# Patient Record
Sex: Female | Born: 1965 | Race: White | Hispanic: No | Smoking: Former smoker
Health system: Southern US, Community
[De-identification: ages and names within clinical notes are randomized; demographics above are authoritative.]

## PROBLEM LIST (undated history)

## (undated) DIAGNOSIS — F1021 Alcohol dependence, in remission: Secondary | ICD-10-CM

## (undated) DIAGNOSIS — M3500 Sicca syndrome, unspecified: Secondary | ICD-10-CM

## (undated) DIAGNOSIS — J189 Pneumonia, unspecified organism: Secondary | ICD-10-CM

## (undated) DIAGNOSIS — G43909 Migraine, unspecified, not intractable, without status migrainosus: Secondary | ICD-10-CM

## (undated) DIAGNOSIS — Z87898 Personal history of other specified conditions: Secondary | ICD-10-CM

## (undated) DIAGNOSIS — I10 Essential (primary) hypertension: Secondary | ICD-10-CM

## (undated) DIAGNOSIS — C439 Malignant melanoma of skin, unspecified: Secondary | ICD-10-CM

## (undated) DIAGNOSIS — G8929 Other chronic pain: Secondary | ICD-10-CM

## (undated) DIAGNOSIS — K219 Gastro-esophageal reflux disease without esophagitis: Secondary | ICD-10-CM

## (undated) DIAGNOSIS — G629 Polyneuropathy, unspecified: Secondary | ICD-10-CM

## (undated) DIAGNOSIS — D649 Anemia, unspecified: Secondary | ICD-10-CM

## (undated) DIAGNOSIS — I671 Cerebral aneurysm, nonruptured: Secondary | ICD-10-CM

## (undated) DIAGNOSIS — E119 Type 2 diabetes mellitus without complications: Secondary | ICD-10-CM

## (undated) DIAGNOSIS — E063 Autoimmune thyroiditis: Secondary | ICD-10-CM

## (undated) DIAGNOSIS — E039 Hypothyroidism, unspecified: Secondary | ICD-10-CM

## (undated) DIAGNOSIS — N390 Urinary tract infection, site not specified: Secondary | ICD-10-CM

## (undated) DIAGNOSIS — R339 Retention of urine, unspecified: Secondary | ICD-10-CM

## (undated) DIAGNOSIS — M1990 Unspecified osteoarthritis, unspecified site: Secondary | ICD-10-CM

## (undated) DIAGNOSIS — F909 Attention-deficit hyperactivity disorder, unspecified type: Secondary | ICD-10-CM

## (undated) DIAGNOSIS — E7800 Pure hypercholesterolemia, unspecified: Secondary | ICD-10-CM

## (undated) DIAGNOSIS — Z9289 Personal history of other medical treatment: Secondary | ICD-10-CM

## (undated) DIAGNOSIS — M542 Cervicalgia: Secondary | ICD-10-CM

## (undated) DIAGNOSIS — E049 Nontoxic goiter, unspecified: Secondary | ICD-10-CM

## (undated) DIAGNOSIS — B1910 Unspecified viral hepatitis B without hepatic coma: Secondary | ICD-10-CM

## (undated) DIAGNOSIS — H548 Legal blindness, as defined in USA: Secondary | ICD-10-CM

## (undated) DIAGNOSIS — R569 Unspecified convulsions: Secondary | ICD-10-CM

## (undated) DIAGNOSIS — Z973 Presence of spectacles and contact lenses: Secondary | ICD-10-CM

## (undated) DIAGNOSIS — Z789 Other specified health status: Secondary | ICD-10-CM

## (undated) HISTORY — PX: OVARIAN CYST REMOVAL: SHX89

## (undated) HISTORY — PX: BILATERAL CARPAL TUNNEL RELEASE: SHX6508

## (undated) HISTORY — PX: LAPAROSCOPIC CHOLECYSTECTOMY: SUR755

## (undated) HISTORY — PX: EYE SURGERY: SHX253

---
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
## 1987-11-13 HISTORY — PX: ECTOPIC PREGNANCY SURGERY: SHX613

## 2006-11-12 HISTORY — PX: ABDOMINAL HYSTERECTOMY: SHX81

## 2011-06-01 ENCOUNTER — Emergency Department (HOSPITAL_COMMUNITY)
Admission: EM | Admit: 2011-06-01 | Discharge: 2011-06-01 | Disposition: A | Discharge status: Home / Self Care | Payer: Self-pay | Attending: Emergency Medicine | Admitting: Emergency Medicine

## 2011-06-01 DIAGNOSIS — Z794 Long term (current) use of insulin: Secondary | ICD-10-CM | POA: Insufficient documentation

## 2011-06-01 DIAGNOSIS — Z7982 Long term (current) use of aspirin: Secondary | ICD-10-CM | POA: Insufficient documentation

## 2011-06-01 DIAGNOSIS — Z79899 Other long term (current) drug therapy: Secondary | ICD-10-CM | POA: Insufficient documentation

## 2011-06-01 DIAGNOSIS — R509 Fever, unspecified: Secondary | ICD-10-CM | POA: Insufficient documentation

## 2011-06-01 DIAGNOSIS — E119 Type 2 diabetes mellitus without complications: Secondary | ICD-10-CM | POA: Insufficient documentation

## 2011-06-01 LAB — COMPREHENSIVE METABOLIC PANEL
ALT: 14 U/L (ref 0–35)
AST: 18 U/L (ref 0–37)
Albumin: 4 g/dL (ref 3.5–5.2)
CO2: 23 mEq/L (ref 19–32)
Calcium: 9.6 mg/dL (ref 8.4–10.5)
GFR calc non Af Amer: >60 mL/min (ref >60)
Sodium: 134 mEq/L — ABNORMAL LOW (ref 135–145)
Total Protein: 7.3 g/dL (ref 6.0–8.3)

## 2011-06-01 LAB — URINALYSIS, ROUTINE W REFLEX MICROSCOPIC
Glucose, UA: NEGATIVE mg/dL
Leukocytes, UA: NEGATIVE
Nitrite: NEGATIVE
Protein, ur: NEGATIVE mg/dL

## 2011-06-01 LAB — DIFFERENTIAL
Basophils Absolute: 0.1 10*3/uL (ref 0.0–0.1)
Basophils Relative: 1 % (ref 0–1)
Monocytes Absolute: 0.7 10*3/uL (ref 0.1–1.0)
Neutro Abs: 4.8 10*3/uL (ref 1.7–7.7)
Neutrophils Relative %: 53 % (ref 43–77)

## 2011-06-01 LAB — CBC
Hemoglobin: 13.1 g/dL (ref 12.0–15.0)
MCHC: 35.5 g/dL (ref 30.0–36.0)
RBC: 4.08 MIL/uL (ref 3.87–5.11)

## 2011-06-02 LAB — URINE CULTURE
Colony Count: NO GROWTH
Culture  Setup Time: 201207202205
Culture: NO GROWTH

## 2012-01-18 ENCOUNTER — Encounter (HOSPITAL_COMMUNITY): Payer: Self-pay | Admitting: Emergency Medicine

## 2012-01-18 ENCOUNTER — Emergency Department (HOSPITAL_COMMUNITY)
Admission: EM | Admit: 2012-01-18 | Discharge: 2012-01-18 | Disposition: A | Discharge status: Home / Self Care | Payer: Self-pay | Attending: Emergency Medicine | Admitting: Emergency Medicine

## 2012-01-18 DIAGNOSIS — Z794 Long term (current) use of insulin: Secondary | ICD-10-CM | POA: Insufficient documentation

## 2012-01-18 DIAGNOSIS — E119 Type 2 diabetes mellitus without complications: Secondary | ICD-10-CM | POA: Insufficient documentation

## 2012-01-18 DIAGNOSIS — I10 Essential (primary) hypertension: Secondary | ICD-10-CM | POA: Insufficient documentation

## 2012-01-18 DIAGNOSIS — Z760 Encounter for issue of repeat prescription: Secondary | ICD-10-CM | POA: Insufficient documentation

## 2012-01-18 HISTORY — DX: Essential (primary) hypertension: I10

## 2012-01-18 MED ORDER — INSULIN GLARGINE 100 UNIT/ML ~~LOC~~ SOLN: 10.0000 [IU] | Freq: Two times a day (BID) | SUBCUTANEOUS | Status: DC

## 2012-01-18 NOTE — ED Provider Notes (Signed)
Medical screening examination/treatment/procedure(s) were performed by non-physician practitioner and as supervising physician I was immediately available for consultation/collaboration.   Gwyneth Sprout, MD 01/18/12 1506

## 2012-01-18 NOTE — ED Notes (Signed)
Felicia, patient advocate at bedside speaking with patient.

## 2012-01-18 NOTE — ED Notes (Signed)
Pt here requesting rx for lantus insulin; pt sts does not have insurance and almost out of meds; pt sts is type 1 DM

## 2012-01-18 NOTE — Progress Notes (Signed)
Received first and only page/consult at 10:16am, spoke with Johnatta. Per pharmacy, patient is eligible for the ZZ fund.

## 2012-01-18 NOTE — Discharge Instructions (Signed)
Return here as needed for any worsening in your condition °

## 2012-01-18 NOTE — ED Notes (Signed)
Asked Research scientist (medical) to re-page Case Manager.

## 2012-01-18 NOTE — ED Notes (Signed)
Thurston Hole, case manager at bedside speaking with patient.

## 2012-01-18 NOTE — Progress Notes (Signed)
I was able to coordinate [facility] with patient and an appointment is scheduled for Tuesday, March 12th with [facility]. Obtained a vial of lantus for patient and she has a script for another refill. She does not need the novolog 70/30  Since she stated she has three bottles at home. Patient was very appreciative of resources we could provide.

## 2012-01-18 NOTE — ED Notes (Signed)
Patient presents requesting referral and sources for future medication refills on her insulin.  Patient denies symptoms of dizziness, weakness, vomiting or pain.  Patient resting quietly, family at bedside, case Production designer, theatre/television/film and social work were contacted

## 2012-01-18 NOTE — ED Provider Notes (Signed)
History     CSN: [id number]  Arrival date & time 01/18/12  4098   First MD Initiated Contact with Patient 01/18/12 [phone]      Chief Complaint  Patient presents with  . Medication Refill    (Consider location/radiation/quality/duration/timing/severity/associated sxs/prior treatment) HPI Patient is here because she is about to run out of Lantus insulin and does not have a primary care doctor.  She has no complaints at this time.  She denies chest pain, shortness of breath, weakness, vomiting, fever, abdominal pain, or excessive urination. Past Medical History  Diagnosis Date  . Diabetes mellitus   . Hypertension     History reviewed. No pertinent past surgical history.  History reviewed. No pertinent family history.  History  Substance Use Topics  . Smoking status: Not on file  . Smokeless tobacco: Not on file  . Alcohol Use:     OB History    Grav Para Term Preterm Abortions TAB SAB Ect Mult Living                  Review of Systems All pertinent positives and negatives reviewed in the history of present illness  Allergies  Codeine and Sulfa antibiotics  Home Medications   Current Outpatient Rx  Name Route Sig Dispense Refill  . INSULIN ASPART 100 UNIT/ML [place] SOLN Subcutaneous Inject 4-5 Units into the skin 3 (three) times daily before meals.    . INSULIN GLARGINE 100 UNIT/ML [place] SOLN Subcutaneous Inject 10 Units into the skin 2 (two) times daily.    Marland Kitchen LISINOPRIL 5 MG PO TABS Oral Take 5 mg by mouth daily.    Carma Leaven M PLUS PO TABS Oral Take 1 tablet by mouth daily.    . INSULIN GLARGINE 100 UNIT/ML [place] SOLN Subcutaneous Inject 10 Units into the skin 2 (two) times daily. 10 mL 1    BP 127/87  Pulse 86  Temp(Src) 98.3 F (36.8 C) (Oral)  Resp 20  SpO2 99%  Physical Exam  Constitutional: She appears well-developed and well-nourished. No distress.  HENT:  Head: Normocephalic and atraumatic.  Cardiovascular: Normal rate and regular rhythm.   Pulmonary/Chest:  Effort normal and breath sounds normal.  Skin: Skin is warm and dry.    ED Course  Procedures (including critical care time)  Labs Reviewed - No data to display No results found.   1. Medication refill    The case managers involved in patient's care.  Patient will be given prescription for Lantus.  And advised to return here as needed for any worsening in her condition.   MDM          Carlyle Dolly, PA-C 01/18/12 1045

## 2012-01-18 NOTE — ED Notes (Addendum)
Correction: patient just ambulated to bathroom. Waiting on insulin from pharmacy

## 2012-07-15 ENCOUNTER — Encounter (HOSPITAL_COMMUNITY): Payer: Self-pay | Admitting: Emergency Medicine

## 2012-07-15 ENCOUNTER — Emergency Department (HOSPITAL_COMMUNITY)
Admission: EM | Admit: 2012-07-15 | Discharge: 2012-07-16 | Disposition: A | Discharge status: Home / Self Care | Payer: Medicaid Other | Attending: Emergency Medicine | Admitting: Emergency Medicine

## 2012-07-15 DIAGNOSIS — I10 Essential (primary) hypertension: Secondary | ICD-10-CM | POA: Insufficient documentation

## 2012-07-15 DIAGNOSIS — Z79899 Other long term (current) drug therapy: Secondary | ICD-10-CM | POA: Insufficient documentation

## 2012-07-15 DIAGNOSIS — N390 Urinary tract infection, site not specified: Secondary | ICD-10-CM | POA: Insufficient documentation

## 2012-07-15 DIAGNOSIS — Z794 Long term (current) use of insulin: Secondary | ICD-10-CM | POA: Insufficient documentation

## 2012-07-15 DIAGNOSIS — E119 Type 2 diabetes mellitus without complications: Secondary | ICD-10-CM | POA: Insufficient documentation

## 2012-07-15 LAB — BASIC METABOLIC PANEL
BUN: 19 mg/dL (ref 6–23)
Chloride: 108 mEq/L (ref 96–112)
GFR calc Af Amer: >90 mL/min (ref >90)
Glucose, Bld: 104 mg/dL — ABNORMAL HIGH (ref 70–99)
Potassium: 4 mEq/L (ref 3.5–5.1)
Sodium: 144 mEq/L (ref 135–145)

## 2012-07-15 LAB — URINALYSIS, ROUTINE W REFLEX MICROSCOPIC
Bilirubin Urine: NEGATIVE
Ketones, ur: NEGATIVE mg/dL
Nitrite: NEGATIVE
Protein, ur: NEGATIVE mg/dL
Urobilinogen, UA: 0.2 mg/dL (ref 0.0–1.0)

## 2012-07-15 LAB — CBC
HCT: 37.6 % (ref 36.0–46.0)
Hemoglobin: 12.6 g/dL (ref 12.0–15.0)
RDW: 13.4 % (ref 11.5–15.5)
WBC: 6.9 10*3/uL (ref 4.0–10.5)

## 2012-07-15 LAB — URINE MICROSCOPIC-ADD ON

## 2012-07-15 MED ORDER — CEPHALEXIN 500 MG PO CAPS: 500.0000 mg | ORAL_CAPSULE | Freq: Four times a day (QID) | ORAL | Status: AC

## 2012-07-15 MED ORDER — DEXTROSE 5 % IV SOLN
1.0000 g | Freq: Once | INTRAVENOUS | Status: AC
  Administered 2012-07-15: 1 g via INTRAVENOUS
  Filled 2012-07-15: qty 10

## 2012-07-15 MED ORDER — HYDROCODONE-ACETAMINOPHEN 5-325 MG PO TABS: 1.0000 | ORAL_TABLET | ORAL | Status: AC | PRN

## 2012-07-15 MED ORDER — SODIUM CHLORIDE 0.9 % IV BOLUS (SEPSIS)
1000.0000 mL | Freq: Once | INTRAVENOUS | Status: AC
  Administered 2012-07-15: 1000 mL via INTRAVENOUS

## 2012-07-15 NOTE — ED Provider Notes (Signed)
History     CSN: [id number]  Arrival date & time 07/15/12  1434   First MD Initiated Contact with Patient 07/15/12 2032      Chief Complaint  Patient presents with  . 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  She reports today she wasn't feeling well and having increasing urinary frequency.  She reports that her blood sugars were slightly elevated and she was concerned about infection.  She's had no fevers or chills.  She does denies dysuria.  She's had no vaginal discharge.  She denies nausea vomiting diarrhea.  She's had no cough or congestion.  Her symptoms are mild in severity   Past Medical History  Diagnosis Date  . Diabetes mellitus   . Hypertension     History reviewed. No pertinent past surgical history.  History reviewed. No pertinent family history.  History  Substance Use Topics  . Smoking status: Not on file  . Smokeless tobacco: Not on file  . Alcohol Use:     OB History    Grav Para Term Preterm Abortions TAB SAB Ect Mult Living                  Review of Systems  All other systems reviewed and are negative.    Allergies  Codeine and Sulfa antibiotics  Home Medications   Current Outpatient Rx  Name Route Sig Dispense Refill  . INSULIN ASPART 100 UNIT/ML [place] SOLN Subcutaneous Inject 3-4 Units into the skin 3 (three) times daily before meals.     . INSULIN GLARGINE 100 UNIT/ML [place] SOLN Subcutaneous Inject 7 Units into the skin 2 (two) times daily.     Marland Kitchen LEVOTHYROXINE SODIUM 25 MCG PO TABS Oral Take 25 mcg by mouth daily.    Carma Leaven M PLUS PO TABS Oral Take 1 tablet by mouth daily.    . QUINAPRIL HCL 5 MG PO TABS Oral Take 5 mg by mouth daily.    . CEPHALEXIN 500 MG PO CAPS Oral Take 1 capsule (500 mg total) by mouth 4 (four) times daily. 28 capsule 0  . HYDROCODONE-ACETAMINOPHEN 5-325 MG PO TABS Oral Take 1 tablet by mouth every 4 (four) hours as needed for pain. 15  tablet 0    BP 116/71  Pulse 78  Temp 98.2 F (36.8 C) (Oral)  Resp 18  SpO2 100%  Physical Exam  Nursing note and vitals reviewed. Constitutional: She is oriented to person, place, and time. She appears well-developed and well-nourished. No distress.  HENT:  Head: Normocephalic and atraumatic.  Eyes: EOM are normal.  Neck: Normal range of motion.  Cardiovascular: Normal rate, regular rhythm and normal heart sounds.   Pulmonary/Chest: Effort normal and breath sounds normal.  Abdominal: Soft. She exhibits no distension. There is no tenderness.  Musculoskeletal: Normal range of motion.  Neurological: She is alert and oriented to person, place, and time.  Skin: Skin is warm and dry.  Psychiatric: She has a normal mood and affect. Judgment normal.    ED Course  Procedures (including critical care time)  Labs Reviewed  URINALYSIS, ROUTINE W REFLEX MICROSCOPIC - Abnormal; Notable for the following:    APPearance CLOUDY (*)     Leukocytes, UA MODERATE (*)     All other components within normal limits  URINE MICROSCOPIC-ADD ON - Abnormal; Notable for the following:    Squamous Epithelial / LPF MANY (*)     Bacteria,  UA MANY (*)     All other components within normal limits  BASIC METABOLIC PANEL - Abnormal; Notable for the following:    Glucose, Bld 104 (*)     All other components within normal limits  GLUCOSE, CAPILLARY  CBC  URINE CULTURE   No results found.   1. Urinary tract infection       MDM  Patient has evidence of UTI.  She has no evidence of diabetic ketoacidosis.  Her blood sugars here in the emergency department are controlled.  She was given a dose of Rocephin in emergency apartment discharged home on Keflex.  She is well appearing        Lyanne Co, MD 07/15/12 [phone]

## 2012-07-15 NOTE — ED Notes (Signed)
Per EMS: pt c/o "not feeling good today"; pt sts hx of frequent UTI and sts could be same; pt self caths

## 2012-07-16 NOTE — ED Notes (Signed)
Pt requested to speak with a Child psychotherapist prior to discharge. [name], SW returned called spoke with pt.

## 2012-07-17 LAB — URINE CULTURE: Colony Count: >=100000

## 2012-07-18 NOTE — ED Notes (Signed)
+  Urine. Patient given Keflex. No sensitivity listed. Chart sent to EDP office for review. °

## 2012-11-12 HISTORY — PX: SHOULDER ARTHROSCOPY W/ ROTATOR CUFF REPAIR: SHX2400

## 2012-11-24 ENCOUNTER — Encounter (HOSPITAL_COMMUNITY): Payer: Self-pay | Admitting: *Deleted

## 2012-11-24 ENCOUNTER — Emergency Department (HOSPITAL_COMMUNITY)
Admission: EM | Admit: 2012-11-24 | Discharge: 2012-11-25 | Disposition: A | Discharge status: Home / Self Care | Payer: No Typology Code available for payment source | Attending: Emergency Medicine | Admitting: Emergency Medicine

## 2012-11-24 DIAGNOSIS — Z79899 Other long term (current) drug therapy: Secondary | ICD-10-CM | POA: Insufficient documentation

## 2012-11-24 DIAGNOSIS — L0390 Cellulitis, unspecified: Secondary | ICD-10-CM

## 2012-11-24 DIAGNOSIS — E119 Type 2 diabetes mellitus without complications: Secondary | ICD-10-CM | POA: Insufficient documentation

## 2012-11-24 DIAGNOSIS — I10 Essential (primary) hypertension: Secondary | ICD-10-CM | POA: Insufficient documentation

## 2012-11-24 DIAGNOSIS — Z794 Long term (current) use of insulin: Secondary | ICD-10-CM | POA: Insufficient documentation

## 2012-11-24 DIAGNOSIS — L02419 Cutaneous abscess of limb, unspecified: Secondary | ICD-10-CM | POA: Insufficient documentation

## 2012-11-24 LAB — GLUCOSE, CAPILLARY: Glucose-Capillary: 86 mg/dL (ref 70–99)

## 2012-11-24 LAB — CBC WITH DIFFERENTIAL/PLATELET
Basophils Absolute: 0.1 10*3/uL (ref 0.0–0.1)
Basophils Relative: 1 % (ref 0–1)
Eosinophils Absolute: 0.2 10*3/uL (ref 0.0–0.7)
Eosinophils Relative: 2 % (ref 0–5)
HCT: 38.8 % (ref 36.0–46.0)
Hemoglobin: 13 g/dL (ref 12.0–15.0)
MCH: 30.2 pg (ref 26.0–34.0)
MCHC: 33.5 g/dL (ref 30.0–36.0)
Monocytes Absolute: 1.3 10*3/uL — ABNORMAL HIGH (ref 0.1–1.0)
Monocytes Relative: 13 % — ABNORMAL HIGH (ref 3–12)
Neutro Abs: 7.1 10*3/uL (ref 1.7–7.7)
RDW: 13.7 % (ref 11.5–15.5)

## 2012-11-24 NOTE — ED Notes (Signed)
Pt states 5 days ago turned neck with severe neck pain; joint pain; hands swollen; knees/shoulder/hip/pain; had cut left ankle same day while shaving--a few days later developed redness/heat/swelling at site; developing chills today

## 2012-11-25 LAB — URINALYSIS, ROUTINE W REFLEX MICROSCOPIC
Bilirubin Urine: NEGATIVE
Leukocytes, UA: NEGATIVE
Nitrite: NEGATIVE
Specific Gravity, Urine: 1.019 (ref 1.005–1.030)
Urobilinogen, UA: 0.2 mg/dL (ref 0.0–1.0)
pH: 6 (ref 5.0–8.0)

## 2012-11-25 LAB — COMPREHENSIVE METABOLIC PANEL
AST: 51 U/L — ABNORMAL HIGH (ref 0–37)
Albumin: 3.7 g/dL (ref 3.5–5.2)
BUN: 17 mg/dL (ref 6–23)
Calcium: 9.7 mg/dL (ref 8.4–10.5)
Chloride: 100 mEq/L (ref 96–112)
Creatinine, Ser: 0.73 mg/dL (ref 0.50–1.10)
Total Bilirubin: 0.9 mg/dL (ref 0.3–1.2)
Total Protein: 7.2 g/dL (ref 6.0–8.3)

## 2012-11-25 MED ORDER — SODIUM CHLORIDE 0.9 % IV BOLUS (SEPSIS)
1000.0000 mL | Freq: Once | INTRAVENOUS | Status: AC
  Administered 2012-11-25: 1000 mL via INTRAVENOUS

## 2012-11-25 MED ORDER — NAPROXEN 375 MG PO TABS: 375.0000 mg | ORAL_TABLET | Freq: Two times a day (BID) | ORAL | Status: DC

## 2012-11-25 MED ORDER — CEPHALEXIN 500 MG PO CAPS
1000.0000 mg | ORAL_CAPSULE | Freq: Once | ORAL | Status: AC
  Administered 2012-11-25: 1000 mg via ORAL
  Filled 2012-11-25: qty 2

## 2012-11-25 MED ORDER — CLINDAMYCIN PHOSPHATE 600 MG/50ML IV SOLN
600.0000 mg | Freq: Once | INTRAVENOUS | Status: AC
  Administered 2012-11-25: 600 mg via INTRAVENOUS
  Filled 2012-11-25: qty 50

## 2012-11-25 MED ORDER — CLINDAMYCIN HCL 150 MG PO CAPS: 150.0000 mg | ORAL_CAPSULE | Freq: Four times a day (QID) | ORAL | Status: DC

## 2012-11-25 MED ORDER — IBUPROFEN 800 MG PO TABS
800.0000 mg | ORAL_TABLET | Freq: Once | ORAL | Status: AC
  Administered 2012-11-25: 800 mg via ORAL
  Filled 2012-11-25: qty 1

## 2012-11-25 NOTE — ED Provider Notes (Signed)
Medical screening examination/treatment/procedure(s) were performed by non-physician practitioner and as supervising physician I was immediately available for consultation/collaboration.  Olivia Mackie, MD 11/25/12 [phone]

## 2012-11-25 NOTE — ED Notes (Signed)
Pt. Has noticed a slight rash on both feet towards the heel area. Pt. Wanted to make PA aware so that rash may be assessed before departure.

## 2012-11-25 NOTE — ED Notes (Signed)
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 Pt lives in homeless shelter now so diet has changed. Pt had cut from ankle shaving 5 days prior which has not healed and gotten worse. Ankle is red and swollen despite neosporin being applied before. Pt had an E. Coli virus in 2004 and states it feels similar in her legs. Pt has joints all inflamed and cannot make a fist.

## 2012-11-25 NOTE — ED Provider Notes (Signed)
History     CSN: [id number]  Arrival date & time 11/24/12  2224   First MD Initiated Contact with Patient 11/25/12 0137      No chief complaint on file.   (Consider location/radiation/quality/duration/timing/severity/associated sxs/prior treatment) HPI Comments: Mary Erickson is a 47 y.o. female with a history of type 1 diabetes presents to the emergency department with multiple complaints.  Patient states that she is homeless and currently is living at a [employer].  Primary complaint is left ankle pain, swelling, and warmth.  Patient reports that she cut that ankle while shaving 5 days ago and states concern for infection.  She denies any purulent drainage from wound site, fevers, night sweats or chills. In addition she reports pain to every joint including knees, shoulder, hips, neck and back. She denies rash, weight loss, torticollis. She also reports needing to self cath with straw for urinary retention and that she has increased UTIs from this. She denies having current urinary retention, but does report mild dysuria.   The history is provided by the patient.    Past Medical History  Diagnosis Date  . Diabetes mellitus   . Hypertension     History reviewed. No pertinent past surgical history.  No family history on file.  History  Substance Use Topics  . Smoking status: Not on file  . Smokeless tobacco: Not on file  . Alcohol Use:     OB History    Grav Para Term Preterm Abortions TAB SAB Ect Mult Living                  Review of Systems  All other systems reviewed and are negative.    Allergies  Codeine and Sulfa antibiotics  Home Medications   Current Outpatient Rx  Name  Route  Sig  Dispense  Refill  . IBUPROFEN 200 MG PO TABS   Oral   Take 400 mg by mouth every 8 (eight) hours as needed. For pain.         . INSULIN ASPART 100 UNIT/ML [place] SOLN   Subcutaneous   Inject 3-4 Units into the skin 3 (three) times daily before meals.            . INSULIN GLARGINE 100 UNIT/ML [place] SOLN   Subcutaneous   Inject 7 Units into the skin 2 (two) times daily.          Marland Kitchen LEVOTHYROXINE SODIUM 25 MCG PO TABS   Oral   Take 25 mcg by mouth daily.         Marland Kitchen MAGNESIUM GLUCONATE 500 MG PO TABS   Oral   Take 500 mg by mouth daily.         Carma Leaven M PLUS PO TABS   Oral   Take 1 tablet by mouth daily.         . QUINAPRIL HCL 5 MG PO TABS   Oral   Take 5 mg by mouth daily.           BP 118/71  Pulse 103  Temp 99 F (37.2 C) (Oral)  Resp 16  SpO2 96%  Physical Exam  Nursing note and vitals reviewed. Constitutional: She is oriented to person, place, and time. She appears well-developed and well-nourished. No distress.  HENT:  Head: Normocephalic and atraumatic.  Eyes: Conjunctivae normal and EOM are normal.  Neck: Normal range of motion.       No nuchal rigidity. Full flexion and extension without difficulty. Mild pain  with rotation. Muscular paraspinal ttp.   Cardiovascular:       Intact distal pulses, tachycardic.   Pulmonary/Chest: Effort normal.       LCAB, normal effort   Musculoskeletal: Normal range of motion.       Full ROM of all extremities, no crepitus or bony ttp.   Neurological: She is alert and oriented to person, place, and time.  Skin: Skin is warm and dry. No rash noted. She is not diaphoretic.       Left ankle with mild warmth, swelling and erythema   Psychiatric: She has a normal mood and affect. Her behavior is normal.    ED Course  Procedures (including critical care time)  Labs Reviewed  CBC WITH DIFFERENTIAL - Abnormal; Notable for the following:    Monocytes Relative 13 (*)     Monocytes Absolute 1.3 (*)     All other components within normal limits  COMPREHENSIVE METABOLIC PANEL - Abnormal; Notable for the following:    AST 51 (*)     ALT 66 (*)     All other components within normal limits  URINALYSIS, ROUTINE W REFLEX MICROSCOPIC - Abnormal; Notable for the following:    Ketones,  ur 15 (*)     All other components within normal limits  GLUCOSE, CAPILLARY   No results found.   No diagnosis found.  BP 118/71  Pulse 101  Temp 99 F (37.2 C) (Oral)  Resp 16  SpO2 98%   MDM  Cellulitis Pt to ER for multiple complaints. Labs and imaging reviewed. Pt appeared dehydrated with a mild case of cellulitis. IVFs and abx given. Pt will be dc with abx and advised to follow up with PCP. Strict return precautions for infection spread.  At this time there does not appear to be any evidence of an acute emergency medical condition and the patient appears stable for discharge with appropriate outpatient follow up.Diagnosis was discussed with patient who verbalizes understanding and is agreeable to discharge.   Jaci Carrel, [place] 11/25/12 [phone]

## 2012-11-25 NOTE — ED Notes (Signed)
PA at bedside.

## 2016-09-24 ENCOUNTER — Other Ambulatory Visit: Payer: Self-pay | Admitting: General Surgery

## 2016-09-24 DIAGNOSIS — C4362 Malignant melanoma of left upper limb, including shoulder: Secondary | ICD-10-CM

## 2016-09-27 ENCOUNTER — Encounter (HOSPITAL_COMMUNITY): Payer: Self-pay

## 2016-09-27 ENCOUNTER — Encounter (HOSPITAL_COMMUNITY)
Admission: RE | Admit: 2016-09-27 | Discharge: 2016-09-27 | Disposition: A | Discharge status: Home / Self Care | Payer: Medicaid Other | Source: Ambulatory Visit | Attending: General Surgery | Admitting: General Surgery

## 2016-09-27 DIAGNOSIS — Z794 Long term (current) use of insulin: Secondary | ICD-10-CM | POA: Insufficient documentation

## 2016-09-27 DIAGNOSIS — H5462 Unqualified visual loss, left eye, normal vision right eye: Secondary | ICD-10-CM | POA: Diagnosis not present

## 2016-09-27 DIAGNOSIS — Z87891 Personal history of nicotine dependence: Secondary | ICD-10-CM | POA: Insufficient documentation

## 2016-09-27 DIAGNOSIS — D649 Anemia, unspecified: Secondary | ICD-10-CM | POA: Insufficient documentation

## 2016-09-27 DIAGNOSIS — N319 Neuromuscular dysfunction of bladder, unspecified: Secondary | ICD-10-CM | POA: Insufficient documentation

## 2016-09-27 DIAGNOSIS — C4362 Malignant melanoma of left upper limb, including shoulder: Secondary | ICD-10-CM | POA: Diagnosis not present

## 2016-09-27 DIAGNOSIS — Z79899 Other long term (current) drug therapy: Secondary | ICD-10-CM | POA: Insufficient documentation

## 2016-09-27 DIAGNOSIS — I10 Essential (primary) hypertension: Secondary | ICD-10-CM | POA: Diagnosis not present

## 2016-09-27 DIAGNOSIS — Z90710 Acquired absence of both cervix and uterus: Secondary | ICD-10-CM | POA: Diagnosis not present

## 2016-09-27 DIAGNOSIS — Z7982 Long term (current) use of aspirin: Secondary | ICD-10-CM | POA: Insufficient documentation

## 2016-09-27 DIAGNOSIS — M1990 Unspecified osteoarthritis, unspecified site: Secondary | ICD-10-CM | POA: Insufficient documentation

## 2016-09-27 DIAGNOSIS — G43909 Migraine, unspecified, not intractable, without status migrainosus: Secondary | ICD-10-CM | POA: Diagnosis not present

## 2016-09-27 DIAGNOSIS — Z9049 Acquired absence of other specified parts of digestive tract: Secondary | ICD-10-CM | POA: Diagnosis not present

## 2016-09-27 DIAGNOSIS — K219 Gastro-esophageal reflux disease without esophagitis: Secondary | ICD-10-CM | POA: Diagnosis not present

## 2016-09-27 DIAGNOSIS — Z9641 Presence of insulin pump (external) (internal): Secondary | ICD-10-CM | POA: Diagnosis not present

## 2016-09-27 DIAGNOSIS — M3500 Sicca syndrome, unspecified: Secondary | ICD-10-CM | POA: Diagnosis not present

## 2016-09-27 DIAGNOSIS — R0602 Shortness of breath: Secondary | ICD-10-CM | POA: Insufficient documentation

## 2016-09-27 DIAGNOSIS — E049 Nontoxic goiter, unspecified: Secondary | ICD-10-CM | POA: Diagnosis not present

## 2016-09-27 DIAGNOSIS — K7589 Other specified inflammatory liver diseases: Secondary | ICD-10-CM | POA: Insufficient documentation

## 2016-09-27 DIAGNOSIS — E039 Hypothyroidism, unspecified: Secondary | ICD-10-CM | POA: Diagnosis not present

## 2016-09-27 DIAGNOSIS — G629 Polyneuropathy, unspecified: Secondary | ICD-10-CM | POA: Diagnosis not present

## 2016-09-27 DIAGNOSIS — E7800 Pure hypercholesterolemia, unspecified: Secondary | ICD-10-CM | POA: Diagnosis not present

## 2016-09-27 DIAGNOSIS — Z01812 Encounter for preprocedural laboratory examination: Secondary | ICD-10-CM | POA: Insufficient documentation

## 2016-09-27 DIAGNOSIS — I728 Aneurysm of other specified arteries: Secondary | ICD-10-CM | POA: Diagnosis not present

## 2016-09-27 DIAGNOSIS — E109 Type 1 diabetes mellitus without complications: Secondary | ICD-10-CM | POA: Insufficient documentation

## 2016-09-27 HISTORY — DX: Nontoxic goiter, unspecified: E04.9

## 2016-09-27 HISTORY — DX: Unspecified osteoarthritis, unspecified site: M19.90

## 2016-09-27 HISTORY — DX: Retention of urine, unspecified: R33.9

## 2016-09-27 HISTORY — DX: Cerebral aneurysm, nonruptured: I67.1

## 2016-09-27 HISTORY — DX: Alcohol dependence, in remission: F10.21

## 2016-09-27 HISTORY — DX: Sjogren syndrome, unspecified: M35.00

## 2016-09-27 HISTORY — DX: Hypercalcemia: E83.52

## 2016-09-27 HISTORY — DX: Personal history of other specified conditions: Z87.898

## 2016-09-27 HISTORY — DX: Hypothyroidism, unspecified: E03.9

## 2016-09-27 HISTORY — DX: Legal blindness, as defined in USA: H54.8

## 2016-09-27 HISTORY — DX: Polyneuropathy, unspecified: G62.9

## 2016-09-27 HISTORY — DX: Migraine, unspecified, not intractable, without status migrainosus: G43.909

## 2016-09-27 HISTORY — DX: Gastro-esophageal reflux disease without esophagitis: K21.9

## 2016-09-27 HISTORY — DX: Urinary tract infection, site not specified: N39.0

## 2016-09-27 HISTORY — DX: Presence of spectacles and contact lenses: Z97.3

## 2016-09-27 HISTORY — DX: Pure hypercholesterolemia, unspecified: E78.00

## 2016-09-27 HISTORY — DX: Malignant melanoma of skin, unspecified: C43.9

## 2016-09-27 HISTORY — DX: Unspecified convulsions: R56.9

## 2016-09-27 LAB — COMPREHENSIVE METABOLIC PANEL
ALBUMIN: 4.4 g/dL (ref 3.5–5.0)
ALT: 35 U/L (ref 14–54)
AST: 36 U/L (ref 15–41)
Alkaline Phosphatase: 69 U/L (ref 38–126)
Anion gap: 9 (ref 5–15)
BUN: 11 mg/dL (ref 6–20)
CHLORIDE: 104 mmol/L (ref 101–111)
CO2: 28 mmol/L (ref 22–32)
CREATININE: 0.77 mg/dL (ref 0.44–1.00)
Calcium: 10 mg/dL (ref 8.9–10.3)
GFR calc non Af Amer: >60 mL/min (ref >60)
GLUCOSE: 90 mg/dL (ref 65–99)
Potassium: 4.1 mmol/L (ref 3.5–5.1)
SODIUM: 141 mmol/L (ref 135–145)
Total Bilirubin: 0.9 mg/dL (ref 0.3–1.2)
Total Protein: 7.3 g/dL (ref 6.5–8.1)

## 2016-09-27 LAB — CBC
HCT: 39.5 % (ref 36.0–46.0)
Hemoglobin: 13.1 g/dL (ref 12.0–15.0)
MCH: 30.8 pg (ref 26.0–34.0)
MCHC: 33.2 g/dL (ref 30.0–36.0)
MCV: 92.9 fL (ref 78.0–100.0)
PLATELETS: 231 10*3/uL (ref 150–400)
RBC: 4.25 MIL/uL (ref 3.87–5.11)
RDW: 12.8 % (ref 11.5–15.5)
WBC: 6.8 10*3/uL (ref 4.0–10.5)

## 2016-09-27 LAB — GLUCOSE, CAPILLARY: GLUCOSE-CAPILLARY: 91 mg/dL (ref 65–99)

## 2016-09-27 NOTE — Pre-Procedure Instructions (Signed)
Mary Erickson  09/27/2016      [facility] [phone] - [place], [address] [place] [address] Phone: [phone] Fax: [phone]    Your procedure is scheduled on Tuesday, November 21st, 2017.  Report to [facility] Admitting at 7:45 A.M.   Call this number if you have problems the morning of surgery:  [phone]   Remember:  Do not eat food or drink liquids after midnight.   Take these medicines the morning of surgery with A SIP OF WATER: Acetaminophen if needed, eye drops if needed, Levothyroxine (Synthroid), Ondansetron (Zofran) if needed.  Stop taking: Aspirin, NSAIDS, Aleve, Naproxen, Ibuprofen, Advil, Motrin, BC"s, Goody's, Fish oil, all herbal medications, and all vitamins.    WHAT DO I DO ABOUT MY DIABETES MEDICATION?  . THE NIGHT BEFORE SURGERY, reduce basal rates by 20% at midnight.    . THE MORNING OF SURGERY, keep basal rates at reduced rate (20%).   How to Manage Your Diabetes Before and After Surgery  Why is it important to control my blood sugar before and after surgery? . Improving blood sugar levels before and after surgery helps healing and can limit problems. . A way of improving blood sugar control is eating a healthy diet by: o  Eating less sugar and carbohydrates o  Increasing activity/exercise o  Talking with your doctor about reaching your blood sugar goals . High blood sugars (greater than 180 mg/dL) can raise your risk of infections and slow your recovery, so you will need to focus on controlling your diabetes during the weeks before surgery. . Make sure that the doctor who takes care of your diabetes knows about your planned surgery including the date and location.  How do I manage my blood sugar before surgery? . Check your blood sugar at least 4 times a day, starting 2 days before surgery, to make sure that the level is not too high or low. o Check your  blood sugar the morning of your surgery when you wake up and every 2 hours until you get to the Short Stay unit. . If your blood sugar is less than 70 mg/dL, you will need to treat for low blood sugar: o Do not take insulin. o Treat a low blood sugar (less than 70 mg/dL) with  cup of clear juice (cranberry or apple), 4 glucose tablets, OR glucose gel. o Recheck blood sugar in 15 minutes after treatment (to make sure it is greater than 70 mg/dL). If your blood sugar is not greater than 70 mg/dL on recheck, call [phone] for further instructions. . Report your blood sugar to the short stay nurse when you get to Short Stay.  . If you are admitted to the hospital after surgery: o Your blood sugar will be checked by the staff and you will probably be given insulin after surgery (instead of oral diabetes medicines) to make sure you have good blood sugar levels. o The goal for blood sugar control after surgery is 80-180 mg/dL.    Do not wear jewelry, make-up or nail polish.  Do not wear lotions, powders, or perfumes, or deoderant.  Do not shave 48 hours prior to surgery.   Do not bring valuables to the hospital.  [facility] is not responsible for any belongings or valuables.  Contacts, dentures or bridgework may not be worn into surgery.  Leave your suitcase in the  car.  After surgery it may be brought to your room.  For patients admitted to the hospital, discharge time will be determined by your treatment team.  Patients discharged the day of surgery will not be allowed to drive home.   Special instructions:  Preparing for Surgery.   Please read over the following fact sheets that you were given.

## 2016-09-27 NOTE — Progress Notes (Signed)
PCP - Dr. Lajoyce Corners Cardiologist - denies Endocrinologist - Dr. Meredith Pel  EKG - requested CXR - denies  Echo - denies Stress test - requested Cardiac Cath - denies  Patient denies chest pain and shortness of breath at PAT appointment.    Patient has Type I diabetes with insulin pump.  States she checks her blood sugar at least 7 times a day.  Patient instructed to decrease basal rate by 20% at midnight prior to surgery and to keep it reduced.  Patient's A1C is 5.8 (09/12/16)  Patient informed nurse that her surgery was scheduled to be outpatient but she does not have anyone that could stay with her for the first 24 hours after the procedure.  Nurse notified Debbie at Dr. Marlowe Aschoff office.

## 2016-09-28 NOTE — Progress Notes (Addendum)
Anesthesia Chart Review: Patient is a 50 year old female scheduled for wide local excision and advancement flap closure left upper arm melanoma with sentinel node mapping and biopsy on 10/02/2016 by Dr. Barry Dienes.  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  - PCP is Dr. Lajoyce Corners with [facility].  - Endocrinologist is Dr. Alanson Aly with [facility], last visit 09/12/16. - Urologist is Dr. Burnell Blanks.   Meds include ASA 81 mg, Lipitor, Premarin, folic acid, gabapentin, Novolog, Lantus, insulin pump, levothyroxine, magnesium gluconate, fish oil, Zofran, vitamin E, zinc. (Left voice message for DM Educator regarding patient with insulin pump and date/time of surgery.)   There were no vitals taken for this visit.  EKG (Dr. Lajoyce Corners): Requested.   She thought she had a stress test at [facility], but only a 2014 echo and EKG received.   12/12/12 Echo [facility]): Summary: Normal left ventricle size and systolic function, with no segmental abnormality. Ejection fraction is visually estimated at 65-70%. There is a small (trivial) pericardial effusion. There is no evidence of right atrial or right ventricular collapse.  05/24/15 EEG [facility]; Care Everywhere): Interpretation:  -Drowsiness and sleep were achieved. -No clear epileptiform activity was noted and therefore no definite evidence was noted for a seizure disorder. -The slow transients noted in the left temporal region indicate a mild neurophysiological disturbance in the left temporal region.  Preoperative labs noted. A1c on 09/12/16  (Care Everywhere) was 5.8.   Per her PAT RN, she does not have anyone to stay with her the first 24 hours after surgery. Debbie at [place] was notified.   Will follow-up once additional records received. I attempted to call patient this afternoon, but there was no answer and voice mail had not been set up.  George Hugh [facility] Short Stay Center/Anesthesiology Phone [phone] 09/28/2016 5:01 PM  Addendum: Still awaiting additional records. Requested from [facility] and from Dr. Shan Levans office. In the interim, available information reviewed with anesthesiologist Dr. Smith Robert. Patient tolerated surgery in August. If no acute changes and no worrisome findings of EKG then it is anticipated that she can proceed as planned. She will need an EKG on arrival if 2017 tracing not received. She has an insulin pump. Currently case is scheduled for 9:45 AM - 11:35 AM. Since it is < 2 hours, she may be able to keep her pump on--will defer to the anesthesiologist.   Myra Gianotti, PA-C [facility] Short Stay Center/Anesthesiology Phone [phone] 10/01/2016 3:29 PM

## 2016-10-02 ENCOUNTER — Ambulatory Visit (HOSPITAL_COMMUNITY): Payer: Medicaid Other | Admitting: Vascular Surgery

## 2016-10-02 ENCOUNTER — Encounter (HOSPITAL_COMMUNITY)
Admission: RE | Admit: 2016-10-02 | Discharge: 2016-10-02 | Disposition: A | Discharge status: Home / Self Care | Payer: Medicaid Other | Source: Ambulatory Visit | Attending: General Surgery | Admitting: General Surgery

## 2016-10-02 ENCOUNTER — Encounter (HOSPITAL_COMMUNITY)
Admission: RE | Disposition: A | Discharge status: Home / Self Care | Payer: Self-pay | Source: Ambulatory Visit | Attending: General Surgery

## 2016-10-02 ENCOUNTER — Ambulatory Visit (HOSPITAL_COMMUNITY)
Admission: RE | Admit: 2016-10-02 | Discharge: 2016-10-03 | Disposition: A | Discharge status: Home / Self Care | Payer: Medicaid Other | Source: Ambulatory Visit | Attending: General Surgery | Admitting: General Surgery

## 2016-10-02 ENCOUNTER — Encounter (HOSPITAL_COMMUNITY): Payer: Self-pay | Admitting: *Deleted

## 2016-10-02 DIAGNOSIS — Z811 Family history of alcohol abuse and dependence: Secondary | ICD-10-CM | POA: Insufficient documentation

## 2016-10-02 DIAGNOSIS — Z833 Family history of diabetes mellitus: Secondary | ICD-10-CM | POA: Insufficient documentation

## 2016-10-02 DIAGNOSIS — I10 Essential (primary) hypertension: Secondary | ICD-10-CM | POA: Diagnosis not present

## 2016-10-02 DIAGNOSIS — Z809 Family history of malignant neoplasm, unspecified: Secondary | ICD-10-CM | POA: Diagnosis not present

## 2016-10-02 DIAGNOSIS — R569 Unspecified convulsions: Secondary | ICD-10-CM | POA: Insufficient documentation

## 2016-10-02 DIAGNOSIS — Z818 Family history of other mental and behavioral disorders: Secondary | ICD-10-CM | POA: Insufficient documentation

## 2016-10-02 DIAGNOSIS — E079 Disorder of thyroid, unspecified: Secondary | ICD-10-CM | POA: Insufficient documentation

## 2016-10-02 DIAGNOSIS — I739 Peripheral vascular disease, unspecified: Secondary | ICD-10-CM | POA: Diagnosis not present

## 2016-10-02 DIAGNOSIS — Z9049 Acquired absence of other specified parts of digestive tract: Secondary | ICD-10-CM | POA: Insufficient documentation

## 2016-10-02 DIAGNOSIS — C4362 Malignant melanoma of left upper limb, including shoulder: Secondary | ICD-10-CM

## 2016-10-02 DIAGNOSIS — G43909 Migraine, unspecified, not intractable, without status migrainosus: Secondary | ICD-10-CM | POA: Diagnosis not present

## 2016-10-02 DIAGNOSIS — E119 Type 2 diabetes mellitus without complications: Secondary | ICD-10-CM | POA: Diagnosis not present

## 2016-10-02 DIAGNOSIS — Z79899 Other long term (current) drug therapy: Secondary | ICD-10-CM | POA: Diagnosis not present

## 2016-10-02 DIAGNOSIS — M3500 Sicca syndrome, unspecified: Secondary | ICD-10-CM | POA: Insufficient documentation

## 2016-10-02 DIAGNOSIS — Z836 Family history of other diseases of the respiratory system: Secondary | ICD-10-CM | POA: Insufficient documentation

## 2016-10-02 DIAGNOSIS — K759 Inflammatory liver disease, unspecified: Secondary | ICD-10-CM | POA: Insufficient documentation

## 2016-10-02 DIAGNOSIS — E7800 Pure hypercholesterolemia, unspecified: Secondary | ICD-10-CM | POA: Insufficient documentation

## 2016-10-02 DIAGNOSIS — Z7982 Long term (current) use of aspirin: Secondary | ICD-10-CM | POA: Insufficient documentation

## 2016-10-02 DIAGNOSIS — Z823 Family history of stroke: Secondary | ICD-10-CM | POA: Diagnosis not present

## 2016-10-02 DIAGNOSIS — Z794 Long term (current) use of insulin: Secondary | ICD-10-CM | POA: Diagnosis not present

## 2016-10-02 DIAGNOSIS — F1010 Alcohol abuse, uncomplicated: Secondary | ICD-10-CM | POA: Insufficient documentation

## 2016-10-02 DIAGNOSIS — Z87891 Personal history of nicotine dependence: Secondary | ICD-10-CM | POA: Insufficient documentation

## 2016-10-02 DIAGNOSIS — Z85820 Personal history of malignant melanoma of skin: Secondary | ICD-10-CM | POA: Diagnosis present

## 2016-10-02 HISTORY — PX: EXCISION MELANOMA WITH SENTINEL LYMPH NODE BIOPSY: SHX5628

## 2016-10-02 HISTORY — DX: Other chronic pain: G89.29

## 2016-10-02 HISTORY — DX: Type 2 diabetes mellitus without complications: E11.9

## 2016-10-02 HISTORY — DX: Anemia, unspecified: D64.9

## 2016-10-02 HISTORY — DX: Personal history of other medical treatment: Z92.89

## 2016-10-02 HISTORY — DX: Pneumonia, unspecified organism: J18.9

## 2016-10-02 HISTORY — PX: MELANOMA EXCISION WITH SENTINEL LYMPH NODE BIOPSY: SHX5267

## 2016-10-02 HISTORY — DX: Other specified health status: Z78.9

## 2016-10-02 HISTORY — DX: Cervicalgia: M54.2

## 2016-10-02 HISTORY — DX: Unspecified viral hepatitis B without hepatic coma: B19.10

## 2016-10-02 LAB — CREATININE, SERUM: Creatinine, Ser: 0.73 mg/dL (ref 0.44–1.00)

## 2016-10-02 LAB — CBC
HEMATOCRIT: 38.4 % (ref 36.0–46.0)
HEMOGLOBIN: 12.8 g/dL (ref 12.0–15.0)
MCH: 30.8 pg (ref 26.0–34.0)
MCHC: 33.3 g/dL (ref 30.0–36.0)
MCV: 92.5 fL (ref 78.0–100.0)
Platelets: 226 10*3/uL (ref 150–400)
RBC: 4.15 MIL/uL (ref 3.87–5.11)
RDW: 13 % (ref 11.5–15.5)
WBC: 8.6 10*3/uL (ref 4.0–10.5)

## 2016-10-02 LAB — GLUCOSE, CAPILLARY
GLUCOSE-CAPILLARY: 136 mg/dL — AB (ref 65–99)
GLUCOSE-CAPILLARY: 145 mg/dL — AB (ref 65–99)
GLUCOSE-CAPILLARY: 262 mg/dL — AB (ref 65–99)
Glucose-Capillary: 158 mg/dL — ABNORMAL HIGH (ref 65–99)

## 2016-10-02 SURGERY — EXCISION, MELANOMA, WITH SENTINEL LYMPH NODE BIOPSY
Anesthesia: General | Site: Arm Upper | Laterality: Left

## 2016-10-02 MED ORDER — METHYLENE BLUE 1 % INJ SOLN
INTRAMUSCULAR | Status: DC | PRN
  Administered 2016-10-02: 10 mL via SUBMUCOSAL

## 2016-10-02 MED ORDER — BUPIVACAINE HCL 0.5 % IJ SOLN
INTRAMUSCULAR | Status: DC | PRN
  Administered 2016-10-02: 30 mL

## 2016-10-02 MED ORDER — LIDOCAINE HCL (PF) 1 % IJ SOLN
INTRAMUSCULAR | Status: AC
  Filled 2016-10-02: qty 30

## 2016-10-02 MED ORDER — BIOTIN 10000 MCG PO TABS: 10.0000 mg | ORAL_TABLET | Freq: Every day | ORAL | Status: DC

## 2016-10-02 MED ORDER — ASPIRIN EC 81 MG PO TBEC: 81.0000 mg | DELAYED_RELEASE_TABLET | Freq: Every day | ORAL | Status: DC

## 2016-10-02 MED ORDER — MORPHINE SULFATE (PF) 2 MG/ML IV SOLN: 1.0000 mg | INTRAVENOUS | Status: DC | PRN

## 2016-10-02 MED ORDER — LIDOCAINE 2% (20 MG/ML) 5 ML SYRINGE
INTRAMUSCULAR | Status: AC
  Filled 2016-10-02: qty 5

## 2016-10-02 MED ORDER — METHYLENE BLUE 0.5 % INJ SOLN
INTRAVENOUS | Status: AC
  Filled 2016-10-02: qty 10

## 2016-10-02 MED ORDER — TRAMADOL HCL 50 MG PO TABS: 25.0000 mg | ORAL_TABLET | Freq: Four times a day (QID) | ORAL | Status: DC | PRN

## 2016-10-02 MED ORDER — FOLIC ACID 1 MG PO TABS
1000.0000 ug | ORAL_TABLET | Freq: Every day | ORAL | Status: DC
  Filled 2016-10-02: qty 1

## 2016-10-02 MED ORDER — BUPIVACAINE HCL (PF) 0.5 % IJ SOLN
INTRAMUSCULAR | Status: AC
  Filled 2016-10-02: qty 30

## 2016-10-02 MED ORDER — FENTANYL CITRATE (PF) 100 MCG/2ML IJ SOLN: 25.0000 ug | INTRAMUSCULAR | Status: DC | PRN

## 2016-10-02 MED ORDER — FENTANYL CITRATE (PF) 250 MCG/5ML IJ SOLN
INTRAMUSCULAR | Status: AC
  Filled 2016-10-02: qty 5

## 2016-10-02 MED ORDER — DEXAMETHASONE SODIUM PHOSPHATE 10 MG/ML IJ SOLN
INTRAMUSCULAR | Status: DC | PRN
  Administered 2016-10-02: 8 mg via INTRAVENOUS

## 2016-10-02 MED ORDER — GABAPENTIN 300 MG PO CAPS
900.0000 mg | ORAL_CAPSULE | Freq: Every day | ORAL | Status: DC
  Administered 2016-10-02: 900 mg via ORAL
  Filled 2016-10-02: qty 3

## 2016-10-02 MED ORDER — ONDANSETRON HCL 4 MG/2ML IJ SOLN
INTRAMUSCULAR | Status: DC | PRN
  Administered 2016-10-02: 4 mg via INTRAVENOUS

## 2016-10-02 MED ORDER — DIPHENHYDRAMINE HCL 12.5 MG/5ML PO ELIX: 12.5000 mg | ORAL_SOLUTION | Freq: Four times a day (QID) | ORAL | Status: DC | PRN

## 2016-10-02 MED ORDER — VITAMIN D 1000 UNITS PO TABS: 1000.0000 [IU] | ORAL_TABLET | Freq: Every day | ORAL | Status: DC

## 2016-10-02 MED ORDER — PROPOFOL 10 MG/ML IV BOLUS
INTRAVENOUS | Status: AC
  Filled 2016-10-02: qty 40

## 2016-10-02 MED ORDER — VITAMIN B-12 1000 MCG PO TABS: 2500.0000 ug | ORAL_TABLET | Freq: Every day | ORAL | Status: DC

## 2016-10-02 MED ORDER — CEFAZOLIN SODIUM-DEXTROSE 2-4 GM/100ML-% IV SOLN
2.0000 g | Freq: Three times a day (TID) | INTRAVENOUS | Status: AC
  Administered 2016-10-02: 2 g via INTRAVENOUS
  Filled 2016-10-02: qty 100

## 2016-10-02 MED ORDER — ADULT MULTIVITAMIN W/MINERALS CH: 1.0000 | ORAL_TABLET | Freq: Every day | ORAL | Status: DC

## 2016-10-02 MED ORDER — ZINC SULFATE 220 (50 ZN) MG PO CAPS: 220.0000 mg | ORAL_CAPSULE | Freq: Every day | ORAL | Status: DC

## 2016-10-02 MED ORDER — ESTROGENS CONJUGATED 0.45 MG PO TABS
0.4500 mg | ORAL_TABLET | Freq: Every day | ORAL | Status: DC
  Filled 2016-10-02: qty 1

## 2016-10-02 MED ORDER — TRAMADOL HCL 50 MG PO TABS
ORAL_TABLET | ORAL | Status: AC
  Filled 2016-10-02: qty 1

## 2016-10-02 MED ORDER — DEXAMETHASONE SODIUM PHOSPHATE 10 MG/ML IJ SOLN
INTRAMUSCULAR | Status: AC
  Filled 2016-10-02: qty 1

## 2016-10-02 MED ORDER — SODIUM CHLORIDE 0.9% FLUSH: 3.0000 mL | Freq: Two times a day (BID) | INTRAVENOUS | Status: DC

## 2016-10-02 MED ORDER — CHLORHEXIDINE GLUCONATE CLOTH 2 % EX PADS: 6.0000 | MEDICATED_PAD | Freq: Once | CUTANEOUS | Status: DC

## 2016-10-02 MED ORDER — LEVOTHYROXINE SODIUM 25 MCG PO TABS: 25.0000 ug | ORAL_TABLET | Freq: Every day | ORAL | Status: DC

## 2016-10-02 MED ORDER — MIDAZOLAM HCL 5 MG/5ML IJ SOLN
INTRAMUSCULAR | Status: DC | PRN
Start: 2016-10-02 — End: 2016-10-02
  Administered 2016-10-02: 2 mg via INTRAVENOUS

## 2016-10-02 MED ORDER — SODIUM CHLORIDE 0.9 % IV SOLN: 250.0000 mL | INTRAVENOUS | Status: DC | PRN

## 2016-10-02 MED ORDER — OMEGA-3-ACID ETHYL ESTERS 1 G PO CAPS
2.0000 g | ORAL_CAPSULE | Freq: Two times a day (BID) | ORAL | Status: DC
  Administered 2016-10-02: 2 g via ORAL
  Filled 2016-10-02: qty 2

## 2016-10-02 MED ORDER — ATORVASTATIN CALCIUM 20 MG PO TABS: 20.0000 mg | ORAL_TABLET | Freq: Every day | ORAL | Status: DC

## 2016-10-02 MED ORDER — LACTATED RINGERS IV SOLN
INTRAVENOUS | Status: DC
  Administered 2016-10-02: 09:00:00 via INTRAVENOUS

## 2016-10-02 MED ORDER — PROPOFOL 10 MG/ML IV BOLUS
INTRAVENOUS | Status: DC | PRN
Start: 2016-10-02 — End: 2016-10-02
  Administered 2016-10-02: 180 mg via INTRAVENOUS

## 2016-10-02 MED ORDER — GINKGO BILOBA 120 MG PO CAPS: 120.0000 mg | ORAL_CAPSULE | Freq: Every day | ORAL | Status: DC

## 2016-10-02 MED ORDER — SODIUM CHLORIDE 0.9% FLUSH: 3.0000 mL | INTRAVENOUS | Status: DC | PRN

## 2016-10-02 MED ORDER — FENTANYL CITRATE (PF) 100 MCG/2ML IJ SOLN
INTRAMUSCULAR | Status: DC | PRN
  Administered 2016-10-02: 100 ug via INTRAVENOUS
  Administered 2016-10-02: 50 ug via INTRAVENOUS

## 2016-10-02 MED ORDER — TRAMADOL HCL 50 MG PO TABS: 50.0000 mg | ORAL_TABLET | Freq: Four times a day (QID) | ORAL | 1 refills | Status: DC | PRN

## 2016-10-02 MED ORDER — ACETAMINOPHEN 650 MG RE SUPP
650.0000 mg | RECTAL | Status: DC | PRN
  Filled 2016-10-02: qty 1

## 2016-10-02 MED ORDER — OXYCODONE HCL 5 MG PO TABS: 5.0000 mg | ORAL_TABLET | ORAL | Status: DC | PRN

## 2016-10-02 MED ORDER — SENNOSIDES-DOCUSATE SODIUM 8.6-50 MG PO TABS
1.0000 | ORAL_TABLET | Freq: Every evening | ORAL | Status: DC | PRN
Start: 2016-10-02 — End: 2016-10-03

## 2016-10-02 MED ORDER — LIDOCAINE HCL (PF) 1 % IJ SOLN
INTRAMUSCULAR | Status: DC | PRN
  Administered 2016-10-02: 30 mL

## 2016-10-02 MED ORDER — ACETAMINOPHEN 325 MG PO TABS
650.0000 mg | ORAL_TABLET | ORAL | Status: DC | PRN
  Filled 2016-10-02: qty 2

## 2016-10-02 MED ORDER — TRAMADOL HCL 50 MG PO TABS
50.0000 mg | ORAL_TABLET | Freq: Four times a day (QID) | ORAL | Status: DC | PRN
  Administered 2016-10-02 (×2): 50 mg via ORAL
  Filled 2016-10-02: qty 1

## 2016-10-02 MED ORDER — 0.9 % SODIUM CHLORIDE (POUR BTL) OPTIME
TOPICAL | Status: DC | PRN
  Administered 2016-10-02: 1000 mL

## 2016-10-02 MED ORDER — ONDANSETRON 4 MG PO TBDP: 4.0000 mg | ORAL_TABLET | Freq: Four times a day (QID) | ORAL | Status: DC | PRN

## 2016-10-02 MED ORDER — CEFAZOLIN SODIUM-DEXTROSE 2-4 GM/100ML-% IV SOLN
2.0000 g | INTRAVENOUS | Status: AC
  Administered 2016-10-02: 2 g via INTRAVENOUS
  Filled 2016-10-02: qty 100

## 2016-10-02 MED ORDER — CYCLOSPORINE 0.05 % OP EMUL
1.0000 [drp] | Freq: Two times a day (BID) | OPHTHALMIC | Status: DC
  Administered 2016-10-02: 1 [drp] via OPHTHALMIC
  Filled 2016-10-02 (×2): qty 1

## 2016-10-02 MED ORDER — MIDAZOLAM HCL 2 MG/2ML IJ SOLN
INTRAMUSCULAR | Status: AC
  Filled 2016-10-02: qty 4

## 2016-10-02 MED ORDER — DIPHENHYDRAMINE HCL 50 MG/ML IJ SOLN: 12.5000 mg | Freq: Four times a day (QID) | INTRAMUSCULAR | Status: DC | PRN

## 2016-10-02 MED ORDER — INSULIN PUMP
SUBCUTANEOUS | Status: DC
  Administered 2016-10-02: 1.45 via SUBCUTANEOUS
  Administered 2016-10-03: 3 via SUBCUTANEOUS
  Administered 2016-10-03: 4 via SUBCUTANEOUS
  Filled 2016-10-02: qty 1

## 2016-10-02 MED ORDER — TECHNETIUM TC 99M SULFUR COLLOID FILTERED
0.5000 | Freq: Once | INTRAVENOUS | Status: AC | PRN
  Administered 2016-10-02: 0.5 via INTRADERMAL

## 2016-10-02 MED ORDER — ONDANSETRON HCL 4 MG/2ML IJ SOLN: 4.0000 mg | Freq: Four times a day (QID) | INTRAMUSCULAR | Status: DC | PRN

## 2016-10-02 MED ORDER — ONDANSETRON HCL 4 MG/2ML IJ SOLN
INTRAMUSCULAR | Status: AC
  Filled 2016-10-02: qty 2

## 2016-10-02 MED ORDER — ENOXAPARIN SODIUM 40 MG/0.4ML ~~LOC~~ SOLN: 40.0000 mg | SUBCUTANEOUS | Status: DC

## 2016-10-02 MED ORDER — ONDANSETRON 4 MG PO TBDP: 4.0000 mg | ORAL_TABLET | Freq: Three times a day (TID) | ORAL | Status: DC | PRN

## 2016-10-02 MED ORDER — ACETAMINOPHEN ER 650 MG PO TBCR: 1300.0000 mg | EXTENDED_RELEASE_TABLET | Freq: Three times a day (TID) | ORAL | Status: DC

## 2016-10-02 MED ORDER — VITAMIN E 180 MG (400 UNIT) PO CAPS
400.0000 [IU] | ORAL_CAPSULE | Freq: Every day | ORAL | Status: DC
  Filled 2016-10-02 (×2): qty 1

## 2016-10-02 MED ORDER — MAGNESIUM GLUCONATE 500 MG PO TABS
500.0000 mg | ORAL_TABLET | Freq: Every day | ORAL | Status: DC
  Filled 2016-10-02 (×2): qty 1

## 2016-10-02 MED ORDER — VITAMIN C 500 MG PO TABS: 1000.0000 mg | ORAL_TABLET | Freq: Every day | ORAL | Status: DC

## 2016-10-02 MED ORDER — INSULIN ASPART 100 UNIT/ML ~~LOC~~ SOLN: 50.0000 [IU] | SUBCUTANEOUS | Status: DC

## 2016-10-02 MED ORDER — ARTIFICIAL TEARS OP OINT
TOPICAL_OINTMENT | OPHTHALMIC | Status: AC
  Filled 2016-10-02: qty 3.5

## 2016-10-02 MED ORDER — SIMETHICONE 80 MG PO CHEW
80.0000 mg | CHEWABLE_TABLET | Freq: Four times a day (QID) | ORAL | Status: DC | PRN
  Administered 2016-10-02: 80 mg via ORAL
  Filled 2016-10-02: qty 1

## 2016-10-02 SURGICAL SUPPLY — 62 items
ADH SKN CLS APL DERMABOND .7 (GAUZE/BANDAGES/DRESSINGS) ×1
APL SKNCLS STERI-STRIP NONHPOA (GAUZE/BANDAGES/DRESSINGS) ×1
BENZOIN TINCTURE PRP APPL 2/3 (GAUZE/BANDAGES/DRESSINGS) ×2 IMPLANT
BLADE CLIPPER SURG (BLADE) ×2 IMPLANT
BLADE SURG 11 STRL SS (BLADE) ×3 IMPLANT
BNDG COHESIVE 4X5 TAN STRL (GAUZE/BANDAGES/DRESSINGS) ×3 IMPLANT
CANISTER SUCTION 2500CC (MISCELLANEOUS) ×3 IMPLANT
CHLORAPREP W/TINT 26ML (MISCELLANEOUS) ×3 IMPLANT
CLIP TI MEDIUM 6 (CLIP) ×8 IMPLANT
CLOSURE WOUND 1/2 X4 (GAUZE/BANDAGES/DRESSINGS) ×1
CONT SPEC 4OZ CLIKSEAL STRL BL (MISCELLANEOUS) ×6 IMPLANT
COVER MAYO STAND STRL (DRAPES) IMPLANT
COVER PROBE W GEL 5X96 (DRAPES) ×3 IMPLANT
COVER SURGICAL LIGHT HANDLE (MISCELLANEOUS) ×3 IMPLANT
DERMABOND ADVANCED (GAUZE/BANDAGES/DRESSINGS) ×2
DERMABOND ADVANCED .7 DNX12 (GAUZE/BANDAGES/DRESSINGS) IMPLANT
DRAPE LAPAROSCOPIC ABDOMINAL (DRAPES) ×3 IMPLANT
DRAPE ORTHO SPLIT 77X108 STRL (DRAPES)
DRAPE SURG ORHT 6 SPLT 77X108 (DRAPES) ×2 IMPLANT
DRAPE UTILITY XL STRL (DRAPES) ×3 IMPLANT
DRSG TEGADERM 4X4.75 (GAUZE/BANDAGES/DRESSINGS) ×2 IMPLANT
ELECT CAUTERY BLADE 6.4 (BLADE) ×3 IMPLANT
ELECT REM PT RETURN 9FT ADLT (ELECTROSURGICAL) ×3
ELECTRODE REM PT RTRN 9FT ADLT (ELECTROSURGICAL) ×1 IMPLANT
GAUZE SPONGE 4X4 12PLY STRL (GAUZE/BANDAGES/DRESSINGS) IMPLANT
GLOVE BIO SURGEON STRL SZ 6 (GLOVE) ×3 IMPLANT
GLOVE BIO SURGEON STRL SZ7 (GLOVE) ×4 IMPLANT
GLOVE BIOGEL PI IND STRL 6.5 (GLOVE) ×1 IMPLANT
GLOVE BIOGEL PI INDICATOR 6.5 (GLOVE)
GLOVE INDICATOR 6.5 STRL GRN (GLOVE) ×2 IMPLANT
GLOVE INDICATOR 7.0 STRL GRN (GLOVE) ×4 IMPLANT
GOWN STRL REUS W/ TWL LRG LVL3 (GOWN DISPOSABLE) ×1 IMPLANT
GOWN STRL REUS W/TWL 2XL LVL3 (GOWN DISPOSABLE) ×3 IMPLANT
GOWN STRL REUS W/TWL LRG LVL3 (GOWN DISPOSABLE) ×6
KIT BASIN OR (CUSTOM PROCEDURE TRAY) ×3 IMPLANT
KIT ROOM TURNOVER OR (KITS) ×3 IMPLANT
NDL 18GX1X1/2 (RX/OR ONLY) (NEEDLE) ×1 IMPLANT
NDL FILTER BLUNT 18X1 1/2 (NEEDLE) IMPLANT
NDL HYPO 25GX1X1/2 BEV (NEEDLE) ×2 IMPLANT
NEEDLE 18GX1X1/2 (RX/OR ONLY) (NEEDLE) ×3 IMPLANT
NEEDLE FILTER BLUNT 18X 1/2SAF (NEEDLE) ×2
NEEDLE FILTER BLUNT 18X1 1/2 (NEEDLE) ×1 IMPLANT
NEEDLE HYPO 25GX1X1/2 BEV (NEEDLE) ×6 IMPLANT
NS IRRIG 1000ML POUR BTL (IV SOLUTION) ×3 IMPLANT
PACK GENERAL/GYN (CUSTOM PROCEDURE TRAY) ×3 IMPLANT
PACK UNIVERSAL I (CUSTOM PROCEDURE TRAY) ×2 IMPLANT
PAD ARMBOARD 7.5X6 YLW CONV (MISCELLANEOUS) ×3 IMPLANT
SPONGE GAUZE 4X4 12PLY STER LF (GAUZE/BANDAGES/DRESSINGS) ×2 IMPLANT
STAPLER VISISTAT 35W (STAPLE) ×3 IMPLANT
STOCKINETTE IMPERVIOUS 9X36 MD (GAUZE/BANDAGES/DRESSINGS) ×3 IMPLANT
STRIP CLOSURE SKIN 1/2X4 (GAUZE/BANDAGES/DRESSINGS) ×1 IMPLANT
SUT ETHILON 2 0 FS 18 (SUTURE) IMPLANT
SUT MNCRL AB 4-0 PS2 18 (SUTURE) ×3 IMPLANT
SUT SILK 2 0 SH (SUTURE) ×2 IMPLANT
SUT VIC AB 2-0 SH 27 (SUTURE) ×3
SUT VIC AB 2-0 SH 27XBRD (SUTURE) IMPLANT
SUT VIC AB 3-0 SH 27 (SUTURE)
SUT VIC AB 3-0 SH 27X BRD (SUTURE) ×2 IMPLANT
SUT VIC AB 3-0 SH 8-18 (SUTURE) ×2 IMPLANT
SYR CONTROL 10ML LL (SYRINGE) ×6 IMPLANT
TOWEL OR 17X24 6PK STRL BLUE (TOWEL DISPOSABLE) ×3 IMPLANT
TOWEL OR 17X26 10 PK STRL BLUE (TOWEL DISPOSABLE) ×1 IMPLANT

## 2016-10-02 NOTE — Transfer of Care (Signed)
Immediate Anesthesia Transfer of Care Note  Patient: Junnie R Szeto  Procedure(s) Performed: Procedure(s): WIDE LOCAL EXCISION AND ADVANCEMENT FLAP CLOSURE LEFT UPPER  MELANOMA WITH SENTINEL LYMPH NODE MAPPING AND BIOPSY (Left)  Patient Location: PACU  Anesthesia Type:General  Level of Consciousness: awake, alert  and oriented  Airway & Oxygen Therapy: Patient Spontanous Breathing and Patient connected to nasal cannula oxygen  Post-op Assessment: Report given to RN and Post -op Vital signs reviewed and stable  Post vital signs: Reviewed and stable  Last Vitals:  Vitals:   10/02/16 0821 10/02/16 1145  BP: 109/69 (P) 106/73  Pulse: 66   Resp: 18 (P) 16  Temp: 36.7 C (P) 36.4 C    Last Pain:  Vitals:   10/02/16 0842  TempSrc:   PainSc: 7       Patients Stated Pain Goal: 4 ([id number])  Complications: No apparent anesthesia complications

## 2016-10-02 NOTE — Anesthesia Postprocedure Evaluation (Signed)
Anesthesia Post Note  Patient: Mary Erickson  Procedure(s) Performed: Procedure(s) (LRB): WIDE LOCAL EXCISION AND ADVANCEMENT FLAP CLOSURE LEFT UPPER  MELANOMA WITH SENTINEL LYMPH NODE MAPPING AND BIOPSY (Left)  Patient location during evaluation: PACU Anesthesia Type: General Level of consciousness: awake Pain management: pain level controlled Vital Signs Assessment: post-procedure vital signs reviewed and stable Cardiovascular status: stable Postop Assessment: no signs of nausea or vomiting Anesthetic complications: no    Last Vitals:  Vitals:   10/02/16 0821 10/02/16 1145  BP: 109/69 106/73  Pulse: 66   Resp: 18 16  Temp: 36.7 C 36.4 C    Last Pain:  Vitals:   10/02/16 1245  TempSrc:   PainSc: Asleep                 Drue Camera

## 2016-10-02 NOTE — Anesthesia Procedure Notes (Signed)
Procedure Name: LMA Insertion Date/Time: 10/02/2016 10:33 AM Performed by: Noralyn Pick D Pre-anesthesia Checklist: Patient identified, Emergency Drugs available, Suction available and Patient being monitored Patient Re-evaluated:Patient Re-evaluated prior to inductionOxygen Delivery Method: Circle system utilized Preoxygenation: Pre-oxygenation with 100% oxygen Intubation Type: IV induction Ventilation: Mask ventilation without difficulty LMA: LMA inserted LMA Size: 3.0 Tube type: Oral Number of attempts: 1 Placement Confirmation: ETT inserted through vocal cords under direct vision,  positive ETCO2 and breath sounds checked- equal and bilateral Tube secured with: Tape Dental Injury: Teeth and Oropharynx as per pre-operative assessment

## 2016-10-02 NOTE — Discharge Instructions (Addendum)
[place] Office Phone Number [phone]   POST OP INSTRUCTIONS  Always review your discharge instruction sheet given to you by the facility where your surgery was performed.  IF YOU HAVE DISABILITY OR FAMILY LEAVE FORMS, YOU MUST BRING THEM TO THE OFFICE FOR PROCESSING.  DO NOT GIVE THEM TO YOUR DOCTOR.  1. A prescription for pain medication may be given to you upon discharge.  Take your pain medication as prescribed, if needed.  If narcotic pain medicine is not needed, then you may take acetaminophen (Tylenol) or ibuprofen (Advil) as needed. 2. Take your usually prescribed medications unless otherwise directed 3. If you need a refill on your pain medication, please contact your pharmacy.  They will contact our office to request authorization.  Prescriptions will not be filled after 5pm or on week-ends. 4. You should eat very light the first 24 hours after surgery, such as soup, crackers, pudding, etc.  Resume your normal diet the day after surgery 5. It is common to experience some constipation if taking pain medication after surgery.  Increasing fluid intake and taking a stool softener will usually help or prevent this problem from occurring.  A mild laxative (Milk of Magnesia or Miralax) should be taken according to package directions if there are no bowel movements after 48 hours. 6. You may remove the dressing and shower in 48 hours.  The surgical glue will flake off in 2-3 weeks.   7. ACTIVITIES:  No strenuous activity or heavy lifting for 1-2 week.   a. You may drive when you no longer are taking prescription pain medication, you can comfortably wear a seatbelt, and you can safely maneuver your car and apply brakes. b. RETURN TO WORK:  __________n/a_______________ Mary Erickson should see your doctor in the office for a follow-up appointment approximately three-four weeks after your surgery.    WHEN TO CALL YOUR DOCTOR: 1. Fever over 101.0 2. Nausea and/or  vomiting. 3. Extreme swelling or bruising. 4. Continued bleeding from incision. 5. Increased pain, redness, or drainage from the incision.  The clinic staff is available to answer your questions during regular business hours.  Please dont hesitate to call and ask to speak to one of the nurses for clinical concerns.  If you have a medical emergency, go to the nearest emergency room or call 911.  A surgeon from [facility] is always on call at the hospital.  For further questions, please visit [email or web address]

## 2016-10-02 NOTE — H&P (Signed)
Mary Erickson 09/24/2016 11:45 AM Location: [facility] Patient #: [id number] DOB: [birth year] Divorced / Language: Mary Erickson / Race: White Female   History of Present Illness Mary Klein MD; 09/24/2016 3:55 PM) The patient is a 50 year old female who presents for a melanoma biopsy follow-up. Patient is a 50 year old female who has a long history of sun exposure with laying out in the sun and outdoor activities. She is here for consultation regarding a new diagnosis of melanoma on her left posterior upper arm. She is referred by Dr. Lajoyce Corners for consultation regarding this new diagnosis. She had a mole on the back of her arm as long as she can remember. This was flat and didn't change for significant period of time. Around 3 months ago, her niece asked her about this mole. She stated that it had been there for a long time. She did note that she maybe could feel it being slightly raised at that time. She saw her several months later and it had changed dramatically. It was much more raised and had all of the irregular factors that you could see with melanoma. She describes it as "as a classic picture on the Internet." She sought care from her dermatologist at that time. Biopsy demonstrated malignant melanoma that was close to the margins. Breslow depth of 0.75 mm. She had greater than 1 mitosis per square millimeter. There was no ulceration or regression seen. She did have suspicion for LVI. She denies pain in this area. She denies any unplanned weight loss.   She does have a history of Sjogren's disease primarily with eye involvement. She is on disability for this. She is not on any immunosuppression.   Other Problems Mary Erickson, CMA; 09/24/2016 11:45 AM) Alcohol Abuse Arthritis Diabetes Mellitus Hepatitis Hypercholesterolemia Melanoma Migraine Headache Thyroid Disease Transfusion history  Past Surgical History Mary Erickson, CMA; 09/24/2016 11:45  AM) Gallbladder Surgery - Laparoscopic Shoulder Surgery Right.  Diagnostic Studies History Mary Erickson, CMA; 09/24/2016 11:45 AM) Colonoscopy 1-5 years ago  Allergies Mary Erickson, CMA; 09/24/2016 11:46 AM) No Known Drug Allergies11/13/2017  Medication History (Mary Erickson, CMA; 09/24/2016 11:46 AM) Levothyroxine Sodium (25MCG Tablet, Oral) Active. Fluconazole (150MG  Tablet, Oral) Active. Accu-Chek Aviva Plus (In Vitro) Active. Lotemax (0.5% Suspension, Ophthalmic) Active. Premarin (0.45MG  Tablet, Oral) Active. Restasis (0.05% Emulsion, Ophthalmic) Active. Verapamil HCl (80MG  Tablet, Oral) Active. Zonisamide (100MG  Capsule, Oral) Active. NovoLOG (100UNIT/ML Solution, Subcutaneous) Active. Gabapentin (300MG  Capsule, Oral) Active. Atorvastatin Calcium (40MG  Tablet, Oral) Active. SUMAtriptan Succinate (6MG /0.5ML Soln Auto-inj, Subcutaneous) Active. Medications Reconciled  Social History Mary Erickson, CMA; 09/24/2016 11:45 AM) Caffeine use Coffee. No alcohol use No drug use Tobacco use Former smoker.  Family History Mary Erickson, CMA; 09/24/2016 11:45 AM) Alcohol Abuse Brother, Mother. Cancer Mother. Cerebrovascular Accident Mother. Depression Brother. Diabetes Mellitus Brother. Respiratory Condition Mother.    Review of Systems (Mary Erickson; 09/24/2016 11:45 AM) General Present- Night Sweats. Not Present- Appetite Loss, Chills, Fatigue, Fever, Weight Gain and Weight Loss. Skin Present- Change in Wart/Mole and Dryness. Not Present- Hives, Jaundice, New Lesions, Non-Healing Wounds, Rash and Ulcer. HEENT Present- Seasonal Allergies, Sore Throat, Visual Disturbances and Wears glasses/contact lenses. Not Present- Earache, Hearing Loss, Hoarseness, Nose Bleed, Oral Ulcers, Ringing in the Ears, Sinus Pain and Yellow Eyes. Gastrointestinal Present- Constipation and Excessive gas. Not Present- Abdominal Pain, Bloating, Bloody Stool, Change in Bowel  Habits, Chronic diarrhea, Difficulty Swallowing, Gets full quickly at meals, Hemorrhoids, Indigestion, Nausea, Rectal Pain and Vomiting. Musculoskeletal Present- Joint Pain and Joint Stiffness. Not  Present- Back Pain, Muscle Pain, Muscle Weakness and Swelling of Extremities. Neurological Present- Headaches. Not Present- Decreased Memory, Fainting, Numbness, Seizures, Tingling, Tremor, Trouble walking and Weakness. Endocrine Present- Cold Intolerance and Heat Intolerance. Not Present- Excessive Hunger, Hair Changes, Hot flashes and New Diabetes. Hematology Present- Easy Bruising. Not Present- Blood Thinners, Excessive bleeding, Gland problems, HIV and Persistent Infections.  Vitals (Mary Erickson CMA; 09/24/2016 11:47 AM) 09/24/2016 11:46 AM Weight: 147 lb Height: 68in Body Surface Area: 1.79 m Body Mass Index: 22.35 kg/m  Pulse: 77 (Regular)  BP: 124/74 (Sitting, Left Arm, Standard)       Physical Exam Mary Klein MD; 09/24/2016 3:54 PM) General Mental Status-Alert. General Appearance -Note: looks older than stated age.  Hydration-Well hydrated. Voice-Normal.  Head and Neck Head-normocephalic, atraumatic with no lesions or palpable masses. Trachea-midline. Thyroid Gland Characteristics - normal size and consistency.  Eye Eyeball - Bilateral-Extraocular movements intact. Sclera/Conjunctiva - Bilateral-No scleral icterus.  Chest and Lung Exam Chest and lung exam reveals -quiet, even and easy respiratory effort with no use of accessory muscles and on auscultation, normal breath sounds, no adventitious sounds and normal vocal resonance. Inspection Chest Wall - Normal. Back - normal.  Cardiovascular Cardiovascular examination reveals -normal heart sounds, regular rate and rhythm with no murmurs and normal pedal pulses bilaterally.  Abdomen Inspection Inspection of the abdomen reveals - No Hernias. Palpation/Percussion Palpation and Percussion  of the abdomen reveal - Soft, Non Tender, No Rebound tenderness, No Rigidity (guarding) and No hepatosplenomegaly. Auscultation Auscultation of the abdomen reveals - Bowel sounds normal.  Neurologic Neurologic evaluation reveals -alert and oriented x 3 with no impairment of recent or remote memory. Mental Status-Normal.  Musculoskeletal Global Assessment -Note: no gross deformities.  Normal Exam - Left-Upper Extremity Strength Normal and Lower Extremity Strength Normal. Normal Exam - Right-Upper Extremity Strength Normal and Lower Extremity Strength Normal.  Lymphatic Head & Neck  General Head & Neck Lymphatics: Bilateral - Description - Normal. Axillary  General Axillary Region: Bilateral - Description - Normal. Tenderness - Non Tender. Femoral & Inguinal  Generalized Femoral & Inguinal Lymphatics: Bilateral - Description - No Generalized lymphadenopathy.    Assessment & Plan Mary Klein MD; 09/24/2016 3:57 PM) MELANOMA OF LEFT UPPER ARM (C43.62) Impression: I will plan a wide local excision with advancement flap closure. She will need 1 cm margins. She also will get sentinel node biopsy based on presents of mitosis in her specimen. I reviewed surgery with the patient. I reviewed that she would be under general anesthesia and would have a skin injection for the lymph node biopsy prior to going to sleep. I discussed that this is an outpatient procedure the takes approximately an hour and half. Reviewed risks including bleeding, infection, wound complications, numbness, possibility for more surgery, possible dissatisfaction with her scar. The discussed risk of heart and lung complications. The patient understands and like to proceed as soon as possible. I discussed that she would need to have decreased activity for a week postop. Current Plans Pt Education - [facility] Free Text Education/Instructions: discussed with patient and provided information. You are being scheduled for  surgery - Our schedulers will call you.  You should hear from our office's scheduling department within 5 working days about the location, date, and time of surgery. We try to make accommodations for patient's preferences in scheduling surgery, but sometimes the OR schedule or the surgeon's schedule prevents [place] from making those accommodations.  If you have not heard from our office [phone]) in 5 working  days, call the office and ask for your surgeon's nurse.  If you have other questions about your diagnosis, plan, or surgery, call the office and ask for your surgeon's nurse.    Signed by Mary Klein, MD (09/24/2016 3:57 PM)

## 2016-10-02 NOTE — Anesthesia Preprocedure Evaluation (Signed)
Anesthesia Evaluation  Patient identified by MRN, date of birth, ID band Patient awake    Reviewed: Allergy & Precautions, NPO status , Patient's Chart, lab work & pertinent test results  History of Anesthesia Complications Negative for: history of anesthetic complications  Airway Mallampati: II  TM Distance: >3 FB Neck ROM: Full    Dental  (+) Teeth Intact   Pulmonary neg shortness of breath, neg sleep apnea, neg COPD, neg recent URI, former smoker,    breath sounds clear to auscultation       Cardiovascular hypertension, + Peripheral Vascular Disease   Rhythm:Regular     Neuro/Psych  Headaches, Seizures -,  negative psych ROS   GI/Hepatic GERD  Controlled,(+) Hepatitis -, B  Endo/Other  diabetes, Insulin DependentHypothyroidism   Renal/GU      Musculoskeletal  (+) Arthritis ,   Abdominal   Peds  Hematology   Anesthesia Other Findings   Reproductive/Obstetrics                             Anesthesia Physical Anesthesia Plan  ASA: II  Anesthesia Plan: General   Post-op Pain Management:    Induction: Intravenous  Airway Management Planned: LMA  Additional Equipment: None  Intra-op Plan:   Post-operative Plan: Extubation in OR  Informed Consent: I have reviewed the patients History and Physical, chart, labs and discussed the procedure including the risks, benefits and alternatives for the proposed anesthesia with the patient or authorized representative who has indicated his/her understanding and acceptance.   Dental advisory given  Plan Discussed with: CRNA and Surgeon  Anesthesia Plan Comments:         Anesthesia Quick Evaluation

## 2016-10-02 NOTE — Interval H&P Note (Signed)
History and Physical Interval Note:  10/02/2016 9:38 AM  Mary Erickson  has presented today for surgery, with the diagnosis of left upper arm melanoma  The various methods of treatment have been discussed with the patient and family. After consideration of risks, benefits and other options for treatment, the patient has consented to  Procedure(s): WIDE LOCAL EXCISION AND ADVANCEMENT FLAP CLOSURE LEFT UPPER  MELANOMA WITH SENTINEL LYMPH NODE MAPPING AND BIOPSY (Left) as a surgical intervention .  The patient's history has been reviewed, patient examined, no change in status, stable for surgery.  I have reviewed the patient's chart and labs.  Questions were answered to the patient's satisfaction.     Lari Linson

## 2016-10-02 NOTE — Op Note (Addendum)
PRE-OPERATIVE DIAGNOSIS: cT1aN0 left arm melanoma with mitoses  POST-OPERATIVE DIAGNOSIS:  Same  PROCEDURE:  Procedure(s): Wide local excision 1 cm margins, advancement flap closure for defect 3 cm x 5 cm, left axillary sentinel lymph node mapping and biopsy  SURGEON:  Surgeon(s): [name], MD  ANESTHESIA:   local and general  DRAINS: none   LOCAL MEDICATIONS USED:  MARCAINE and XYLOCAINE   SPECIMEN:  Source of Specimen:  Left axillary sentinel lymph nodes x2, wide local excision left posterior upper arm  FINDINGS:  SLN #1 cps 55 and blue.  SLN #2 cps 20.  Background 0 cps  DISPOSITION OF SPECIMEN:  PATHOLOGY  COUNTS:  YES  PLAN OF CARE: Discharge to home after PACU  PATIENT DISPOSITION:  PACU - hemodynamically stable.    PROCEDURE:   Pt was identified in the holding area, taken to the OR, and placed supine on the OR table.  General anesthesia was induced.  Time out was performed according to the surgical safety checklist.  When all was correct, we continued.  Two mL methylene blue was injected intradermally around the melanoma biopsy site.    The patient's left arm and chest were prepped and draped in sterile fashion.  The point of maximum signal intensity was identified with the neoprobe.  A 3 cm incision was made with a #15 blade.  The subcutaneous tissues were divided with the cautery.  A Weitlaner retractor was used to assist with visualization.  The tonsil clamp was used to bluntly dissect the axillary fat pad.  Two sentinel lymph nodes were identified as described above.  The lymphovascular channels were clipped with hemoclips.  The nodes were passed off as specimens.  Hemostasis was achieved with the cautery.  The axilla was irrigated and closed with 3-0 Vicryl deep dermal interrupted sutures and 4-0 Monocryl running subcuticular suture.  This was cleaned, dried, and dressed with dermabond.  Counts were correct.  The armboard was tucked back down to the side. The  melanoma was identified and 1 cm margins were marked out.  Local was administered under the melanoma and the adjacent tissue.  A #10 blade was used to incise the skin around the melanoma in a longitudinal ellipse.  The cautery was used to take the dissection down to the fascia.  The skin was marked in situ with silk suture.  The cautery was used to take the specimen off the fascia, and it was passed off the table.    Penetrating towel clips were used to elevate the edges of the incision and the skin was freed up in all directions.  This was pulled together in a longitudinal orientation.   The skin was pulled together and held with penetrating towel clips. Deep interrupted 2-0 vicryl sutures were placed to relieve tension.  The skin was then reapproximated with 3-0 interrupted vicryl deep dermal sutures and 4-0 monocryl running subcuticular sutures.   The wound was dressed with Benzoin, steristrips, gauze, and tegaderm.    Needle, sponge, and instrument counts were correct.  The patient was awakened from anesthesia and taken to the PACU in stable condition.

## 2016-10-03 ENCOUNTER — Encounter (HOSPITAL_COMMUNITY): Payer: Self-pay | Admitting: General Surgery

## 2016-10-03 DIAGNOSIS — C4362 Malignant melanoma of left upper limb, including shoulder: Secondary | ICD-10-CM | POA: Diagnosis not present

## 2016-10-03 LAB — GLUCOSE, CAPILLARY
GLUCOSE-CAPILLARY: 133 mg/dL — AB (ref 65–99)
Glucose-Capillary: 175 mg/dL — ABNORMAL HIGH (ref 65–99)
Glucose-Capillary: 179 mg/dL — ABNORMAL HIGH (ref 65–99)

## 2016-10-03 NOTE — Progress Notes (Signed)
Discharge home. Home discharge instruction given, no question verbalized. 

## 2016-10-03 NOTE — Addendum Note (Signed)
Addendum  created 10/03/16 1441 by Josephine Igo, CRNA   Anesthesia Intra Meds edited

## 2016-10-03 NOTE — Discharge Summary (Signed)
Physician Discharge Summary  Patient ID: Mary Erickson MRN: [id number] DOB/AGE: Feb 07, 1966 50 y.o.  Admit date: 10/02/2016 Discharge date: 10/03/2016  Admission Diagnoses:  Discharge Diagnoses:  Active Problems:   Malignant melanoma of left upper arm ([place])   Discharged Condition: good  Hospital Course: obs s/p wide local excision left arm melanoma with L axillary sentinel node biopsy. Did very well, no pain on morning of discharge  Consults: None  Significant Diagnostic Studies: n/a  Treatments: surgery: as above  Discharge Exam: Blood pressure (!) 101/58, pulse 74, temperature 98.3 F (36.8 C), resp. rate 18, height 5\' 8"  (1.727 m), weight 69.3 kg (152 lb 12.5 oz), SpO2 97 %. General appearance: alert and cooperative Resp: clear to auscultation bilaterally Chest wall: no tenderness Extremities: extremities normal, atraumatic, no cyanosis or edema Pulses: 2+ and symmetric Skin: Skin color, texture, turgor normal. No rashes or lesions Incision/Wound: c/d/i  Disposition: 01-Home or Self Care     Medication List    STOP taking these medications   clindamycin 150 MG capsule Commonly known as:  CLEOCIN   naproxen 375 MG tablet Commonly known as:  NAPROSYN     TAKE these medications   ACETAMINOPHEN 8 HOUR 650 MG CR tablet Generic drug:  acetaminophen Take 650 mg by mouth every 12 (twelve) hours as needed for pain.   aspirin EC 81 MG tablet Take 81 mg by mouth daily.   atorvastatin 40 MG tablet Commonly known as:  LIPITOR Take 20 mg by mouth daily.   B-12 2500 MCG Tabs Take 2,500 mcg by mouth daily.   Biotin 10000 MCG Tabs Take 10 mg by mouth daily.   CLEAR EYES OP Place 1 drop into both eyes daily.   cycloSPORINE 0.05 % ophthalmic emulsion Commonly known as:  RESTASIS Place 1 drop into both eyes 2 (two) times daily.   D3-1000 1000 units capsule Generic drug:  Cholecalciferol Take 1,000 Units by mouth daily.   estrogens (conjugated) 0.45 MG  tablet Commonly known as:  PREMARIN Take 0.45 mg by mouth daily. Take daily for 21 days then do not take for 7 days.   Fish Oil 1200 MG Caps Take 1,200 mg by mouth 2 (two) times daily.   folic acid Q000111Q MCG tablet Commonly known as:  FOLVITE Take 800 mcg by mouth daily.   gabapentin 300 MG capsule Commonly known as:  NEURONTIN Take 900 mg by mouth at bedtime.   Ginkgo Biloba 120 MG Caps Take 120 mg by mouth daily.   insulin aspart 100 UNIT/ML injection Commonly known as:  novoLOG Inject 50-60 Units into the skin See admin instructions. Insulin pump basil rate 50-60 units throughout day. Additional 0-5 units per bolos.   insulin pump Soln Inject into the skin.   levothyroxine 25 MCG tablet Commonly known as:  SYNTHROID, LEVOTHROID Take 25 mcg by mouth daily.   magnesium gluconate 500 MG tablet Commonly known as:  MAGONATE Take 500 mg by mouth daily.   multivitamins ther. w/minerals Tabs tablet Take 1 tablet by mouth daily.   ondansetron 4 MG disintegrating tablet Commonly known as:  ZOFRAN-ODT Take 4 mg by mouth every 8 (eight) hours as needed for nausea or vomiting.   traMADol 50 MG tablet Commonly known as:  ULTRAM Take 1-2 tablets (50-100 mg total) by mouth every 6 (six) hours as needed for moderate pain or severe pain.   vitamin C 1000 MG tablet Take 1,000 mg by mouth daily.   vitamin E 400 UNIT capsule Generic drug:  vitamin E Take 400 Units by mouth daily.   Zinc 50 MG Tabs Take 50 mg by mouth daily.      Follow-up Information    BYERLY,FAERA, MD In 2 weeks.   Specialty:  General Surgery Contact information: [address] [place] [address] [phone]           Signed: Clovis Riley 10/03/2016, 7:28 AM

## 2016-10-03 NOTE — Progress Notes (Signed)
S: No acute events. No pain at all, has not taken anything for pain since 9pm.  Vitals, labs, intake/output, and orders reviewed at this time.  Gen: A&Ox3, no distress  H&N: EOMI, atraumatic, neck supple Chest: unlabored respirations, RRR Abd: soft, nontender, nondistended Ext: warm, no edema Neuro: grossly normal L axillary incision c/d/i with dermabond, no erythema or edema. L posterior upper arm dressing is clean and dry with no strikethrough, no surrounding edema or tenderness  Lines/tubes/drains: PIV  A/P:  Doing very well S/p WLE L arm melanoma with sentinel node bx, 11/21 Dr. Barry Dienes Discharge home today.    Romana Juniper, MD [facility], [place] Pager [phone]

## 2016-10-03 NOTE — Care Management Note (Signed)
Case Management Note  Patient Details  Name: Mary Erickson MRN: [id number] Date of Birth: 04-10-1966  Subjective/Objective:                    Action/Plan: Pt discharging home. No needs per CM.  Expected Discharge Date:                  Expected Discharge Plan:  Home/Self Care  In-House Referral:     Discharge planning Services     Post Acute Care Choice:    Choice offered to:     DME Arranged:    DME Agency:     HH Arranged:    [place] Agency:     Status of Service:  Completed, signed off  If discussed at [employer] of Stay Meetings, dates discussed:    Additional Comments:  Pollie Friar, RN 10/03/2016, 8:39 AM

## 2016-10-11 NOTE — Progress Notes (Signed)
Please let patient know margins and LN are negative.

## 2019-06-17 ENCOUNTER — Other Ambulatory Visit: Payer: Self-pay

## 2019-06-17 ENCOUNTER — Encounter (HOSPITAL_COMMUNITY): Payer: Self-pay | Admitting: Emergency Medicine

## 2019-06-17 ENCOUNTER — Ambulatory Visit (HOSPITAL_COMMUNITY)
Admission: EM | Admit: 2019-06-17 | Discharge: 2019-06-17 | Disposition: A | Discharge status: Home / Self Care | Payer: Medicaid Other | Attending: Emergency Medicine | Admitting: Emergency Medicine

## 2019-06-17 DIAGNOSIS — R21 Rash and other nonspecific skin eruption: Secondary | ICD-10-CM

## 2019-06-17 LAB — CBC WITH DIFFERENTIAL/PLATELET
Abs Immature Granulocytes: 0.01 10*3/uL (ref 0.00–0.07)
Basophils Absolute: 0.1 10*3/uL (ref 0.0–0.1)
Basophils Relative: 1 %
Eosinophils Absolute: 0.1 10*3/uL (ref 0.0–0.5)
Eosinophils Relative: 2 %
HCT: 42.5 % (ref 36.0–46.0)
Hemoglobin: 13.9 g/dL (ref 12.0–15.0)
Immature Granulocytes: 0 %
Lymphocytes Relative: 44 %
Lymphs Abs: 2.4 10*3/uL (ref 0.7–4.0)
MCH: 31 pg (ref 26.0–34.0)
MCHC: 32.7 g/dL (ref 30.0–36.0)
MCV: 94.7 fL (ref 80.0–100.0)
Monocytes Absolute: 0.6 10*3/uL (ref 0.1–1.0)
Monocytes Relative: 11 %
Neutro Abs: 2.3 10*3/uL (ref 1.7–7.7)
Neutrophils Relative %: 42 %
Platelets: 242 10*3/uL (ref 150–400)
RBC: 4.49 MIL/uL (ref 3.87–5.11)
RDW: 13.3 % (ref 11.5–15.5)
WBC: 5.5 10*3/uL (ref 4.0–10.5)
nRBC: 0 % (ref 0.0–0.2)

## 2019-06-17 LAB — COMPREHENSIVE METABOLIC PANEL
ALT: 30 U/L (ref 0–44)
AST: 34 U/L (ref 15–41)
Albumin: 4.1 g/dL (ref 3.5–5.0)
Alkaline Phosphatase: 86 U/L (ref 38–126)
Anion gap: 7 (ref 5–15)
BUN: 10 mg/dL (ref 6–20)
CO2: 25 mmol/L (ref 22–32)
Calcium: 9.3 mg/dL (ref 8.9–10.3)
Chloride: 105 mmol/L (ref 98–111)
Creatinine, Ser: 0.78 mg/dL (ref 0.44–1.00)
GFR calc Af Amer: >60 mL/min (ref >60)
GFR calc non Af Amer: >60 mL/min (ref >60)
Glucose, Bld: 133 mg/dL — ABNORMAL HIGH (ref 70–99)
Potassium: 4 mmol/L (ref 3.5–5.1)
Sodium: 137 mmol/L (ref 135–145)
Total Bilirubin: 1 mg/dL (ref 0.3–1.2)
Total Protein: 6.7 g/dL (ref 6.5–8.1)

## 2019-06-17 LAB — PROTIME-INR
INR: 1 (ref 0.8–1.2)
Prothrombin Time: 13 seconds (ref 11.4–15.2)

## 2019-06-17 LAB — TSH: TSH: 0.804 u[IU]/mL (ref 0.350–4.500)

## 2019-06-17 MED ORDER — PREDNISONE 20 MG PO TABS: 40.0000 mg | ORAL_TABLET | Freq: Every day | ORAL | 0 refills | Status: AC

## 2019-06-17 NOTE — ED Triage Notes (Signed)
Pt here for rash to legs that she thinks may be vasculitis; pt sts hx of other auto immune disorders

## 2019-06-17 NOTE — Discharge Instructions (Signed)
Will notify of you of any positive findings and if any changes to treatment are needed.   We will start with some prednisone- please closely monitor your blood sugar while taking this.  Your thyroid levels can take time to reflect medication changes which is likely why it was normal 7/30, please continue taking at least half until you have further recheck with your PCP and/or endocrinologist.  If you develop any worsening of symptoms- fever, pain, shortness of breath - or otherwise worsening please return or go to the ER.  Please establish with local care team.

## 2019-06-17 NOTE — ED Provider Notes (Signed)
[place]    CSN: [id number] Arrival date & time: 06/17/19  1132      History   Chief Complaint Chief Complaint  Patient presents with  . Rash    HPI Mary Erickson is a 53 y.o. female.   Mary Erickson presents with complaints of rash to lower legs which she noted last night. No pain, no itching. No numbness or tingling. Hasn't necessarily spread. Denies any previous similar. No recent travel. No fevers. Has had night sweats recently, has seen her gynecologist related to this. These started approximately 1 month ago, therefore she stopped taking her thyroid medication. She just recently started back taking half of it again. Hx of hashimotos, sjogrens, DMt1. She has an insulin pump. She stopped plaquenil approximately 1 year ago. She moved to [place] recently therefore doesn't have local specialists or PCP. Rash is only to her lower legs. States she has had increase in her sjogrens symptoms such as dry mouth and dry eyes.     ROS per HPI, negative if not otherwise mentioned.      Past Medical History:  Diagnosis Date  . Acid reflux   . Anemia   . Arthritis    "elbows, hands, neck, shoulders" (10/02/2016)  . Calcium blood increased   . Chronic neck pain   . Chronic UTI (urinary tract infection)    "from self caths" (10/02/2016)  . Hepatitis B    acute hepatitis B from lancet  . History of alcoholism ([place])    10/02/2016 "sober since 03/14/2005"  . History of blood transfusion 1984; 1989; 2008   "quite a few"  . History of shortness of breath   . Hypercholesteremia   . Hypertension    pt. states that she has only had low blood pressure  . Hypothyroidism   . Internal carotid aneurysm dx'd early 2017  . Legally blind in left eye, as defined in [place]   . Melanoma ([place])    left upper arm  . Migraines    "quite a few in a month; at least 15" (10/02/2016)  . Neuropathy    neck; "not sure if it's from DM or Sjogren's" (10/02/2016)  . Pneumonia 1986    "1 yr after major OR when I was under anesthesia for 13 hr"  . Seizures ([place]) ~ 2004; ~ 2013   "seizures from diabetes "  . Self-catheterizes urinary bladder    "since OR in 1984" (10/02/2016)  . Sjogren's syndrome ([place])   . Thyroid goiter    "still present" (10/02/2016)  . Type II diabetes mellitus ([place])   . Urinary retention    self caths  . Wears glasses     Patient Active Problem List   Diagnosis Date Noted  . Malignant melanoma of left upper arm ([place]) 10/02/2016    Past Surgical History:  Procedure Laterality Date  . ABDOMINAL HYSTERECTOMY  2008  . ECTOPIC PREGNANCY SURGERY  1989  . EXCISION MELANOMA WITH SENTINEL LYMPH NODE BIOPSY Left 10/02/2016   Procedure: WIDE LOCAL EXCISION AND ADVANCEMENT FLAP CLOSURE LEFT UPPER  MELANOMA WITH SENTINEL LYMPH NODE MAPPING AND BIOPSY;  Surgeon: [name], MD;  Location: [facility];  Service: General;  Laterality: Left;  . EYE SURGERY    . LAPAROSCOPIC CHOLECYSTECTOMY  ~ 01/2016  . MELANOMA EXCISION WITH SENTINEL LYMPH NODE BIOPSY Left 10/02/2016   WIDE LOCAL EXCISION AND ADVANCEMENT FLAP CLOSURE LEFT UPPER  MELANOMA WITH SENTINEL LYMPH NODE MAPPING AND BIOPSY  . SHOULDER ARTHROSCOPY W/ ROTATOR  CUFF REPAIR Right 2014   "partial"  . STRABISMUS SURGERY Left 1971  . TUMOR EXCISION  1984   from tail bone and part of tail bone removed    OB History   No obstetric history on file.      Home Medications    Prior to Admission medications   Medication Sig Start Date End Date Taking? Authorizing Provider  acetaminophen (ACETAMINOPHEN 8 HOUR) 650 MG CR tablet Take 650 mg by mouth every 12 (twelve) hours as needed for pain.    [provider]  Ascorbic Acid (VITAMIN C) 1000 MG tablet Take 1,000 mg by mouth daily.    [provider]  aspirin EC 81 MG tablet Take 81 mg by mouth daily.    [provider]  atorvastatin (LIPITOR) 40 MG tablet Take 20 mg by mouth daily.     [provider]  Biotin 10000 MCG  TABS Take 10 mg by mouth daily.     [provider]  Cholecalciferol (D3-1000) 1000 units capsule Take 1,000 Units by mouth daily.    [provider]  Cyanocobalamin (B-12) 2500 MCG TABS Take 2,500 mcg by mouth daily.     [provider]  cycloSPORINE (RESTASIS) 0.05 % ophthalmic emulsion Place 1 drop into both eyes 2 (two) times daily.    [provider]  estrogens, conjugated, (PREMARIN) 0.45 MG tablet Take 0.45 mg by mouth daily. Take daily for 21 days then do not take for 7 days.    [provider]  folic acid (FOLVITE) 299 MCG tablet Take 800 mcg by mouth daily.    [provider]  gabapentin (NEURONTIN) 300 MG capsule Take 900 mg by mouth at bedtime.     [provider]  Ginkgo Biloba 120 MG CAPS Take 120 mg by mouth daily.     [provider]  insulin aspart (NOVOLOG) 100 UNIT/ML injection Inject 50-60 Units into the skin See admin instructions. Insulin pump basil rate 50-60 units throughout day. Additional 0-5 units per bolos.    [provider]  Insulin Human (INSULIN PUMP) SOLN Inject into the skin.    [provider]  levothyroxine (SYNTHROID, LEVOTHROID) 25 MCG tablet Take 25 mcg by mouth daily.    [provider]  magnesium gluconate (MAGONATE) 500 MG tablet Take 500 mg by mouth daily.    [provider]  Multiple Vitamins-Minerals (MULTIVITAMINS THER. W/MINERALS) TABS Take 1 tablet by mouth daily.    [provider]  Naphazoline HCl (CLEAR EYES OP) Place 1 drop into both eyes daily.    [provider]  Omega-3 Fatty Acids (FISH OIL) 1200 MG CAPS Take 1,200 mg by mouth 2 (two) times daily.     [provider]  ondansetron (ZOFRAN-ODT) 4 MG disintegrating tablet Take 4 mg by mouth every 8 (eight) hours as needed for nausea or vomiting.    [provider]  predniSONE (DELTASONE) 20 MG tablet Take 2 tablets (40 mg total) by mouth daily with  breakfast for 5 days. 06/17/19 06/22/19  Zigmund Gottron, NP  traMADol (ULTRAM) 50 MG tablet Take 1-2 tablets (50-100 mg total) by mouth every 6 (six) hours as needed for moderate pain or severe pain. 10/02/16   [name], MD  vitamin E (VITAMIN E) 400 UNIT capsule Take 400 Units by mouth daily.    [provider]  Zinc 50 MG TABS Take 50 mg by mouth daily.     [provider]  Family History History reviewed. No pertinent family history.  Social History Social History   Tobacco Use  . Smoking status: Former Smoker    Packs/day: 1.50    Years: 24.00    Pack years: 36.00    Types: Cigarettes    Quit date: 2008    Years since quitting: 12.6  . Smokeless tobacco: Never Used  Substance Use Topics  . Alcohol use: No    Comment: 10/02/2016 "sober since 03/14/2005"  . Drug use: Yes    Types: Marijuana    Comment: 10/02/2016 " occasional; nothing since before 2006"     Allergies   Doxycycline, Aspirin, Ciprofloxacin, Nsaids, Codeine, Hydrocodone-acetaminophen, Oxycodone, Sulfa antibiotics, and Sulfasalazine   Review of Systems Review of Systems   Physical Exam Triage Vital Signs ED Triage Vitals  Enc Vitals Group     BP 06/17/19 1143 129/87     Pulse Rate 06/17/19 1143 77     Resp 06/17/19 1143 18     Temp 06/17/19 1143 98.2 F (36.8 C)     Temp Source 06/17/19 1143 Oral     SpO2 06/17/19 1143 99 %     Weight --      Height --      Head Circumference --      Peak Flow --      Pain Score 06/17/19 1144 0     Pain Loc --      Pain Edu? --      Excl. in [name]? --    No data found.  Updated Vital Signs BP 129/87 (BP Location: Left Arm)   Pulse 77   Temp 98.2 F (36.8 C) (Oral)   Resp 18   SpO2 99%   Visual Acuity Right Eye Distance:   Left Eye Distance:   Bilateral Distance:    Right Eye Near:   Left Eye Near:    Bilateral Near:     Physical Exam Constitutional:      General: She is not in acute distress.    Appearance: She is  well-developed.  Cardiovascular:     Rate and Rhythm: Normal rate.  Pulmonary:     Effort: Pulmonary effort is normal.  Skin:    General: Skin is warm and dry.     Findings: Petechiae present.     Comments: A few scattered petechiael/ non blanching lesions to lower legs; <10 total; a few with scabs noted; no surrounding redness; most are smooth if no scab; non tender; no drainage or fluctuance  Neurological:     Mental Status: She is alert and oriented to person, place, and time.      UC Treatments / Results  Labs (all labs ordered are listed, but only abnormal results are displayed) Labs Reviewed  CBC WITH DIFFERENTIAL/PLATELET  COMPREHENSIVE METABOLIC PANEL  TSH  PROTIME-INR    EKG   Radiology No results found.  Procedures Procedures (including critical care time)  Medications Ordered in UC Medications - No data to display  Initial Impression / Assessment and Plan / UC Course  I have reviewed the triage vital signs and the nursing notes.  Pertinent labs & imaging results that were available during my care of the patient were reviewed by me and considered in my medical decision making (see chart for details).     Vascular rash? 5 days of prednisone, encouraged to monitor blood sugar. Will check some basic labs as well since never has had previous, hasn't been following with her rheumatologist and recently stopped  her thyroid medication. Return precautions provided. Encouraged follow up with PCP/rheum. Patient verbalized understanding and agreeable to plan.    Final Clinical Impressions(s) / UC Diagnoses   Final diagnoses:  Rash     Discharge Instructions     Will notify of you of any positive findings and if any changes to treatment are needed.   We will start with some prednisone- please closely monitor your blood sugar while taking this.  Your thyroid levels can take time to reflect medication changes which is likely why it was normal 7/30, please continue  taking at least half until you have further recheck with your PCP and/or endocrinologist.  If you develop any worsening of symptoms- fever, pain, shortness of breath - or otherwise worsening please return or go to the ER.  Please establish with local care team.    ED Prescriptions    Medication Sig Dispense Auth. Provider   predniSONE (DELTASONE) 20 MG tablet Take 2 tablets (40 mg total) by mouth daily with breakfast for 5 days. 10 tablet Zigmund Gottron, NP     Controlled Substance Prescriptions [place] Controlled Substance Registry consulted? Not Applicable   Zigmund Gottron, NP 06/17/19 1251

## 2019-06-18 ENCOUNTER — Telehealth (HOSPITAL_COMMUNITY): Payer: Self-pay | Admitting: Emergency Medicine

## 2019-06-18 NOTE — Telephone Encounter (Signed)
Normal labs, pt contacted and made aware, will follow up with PCP for continued symptoms.

## 2019-06-25 ENCOUNTER — Ambulatory Visit: Payer: Medicaid Other

## 2019-06-30 ENCOUNTER — Ambulatory Visit: Payer: Medicaid Other | Admitting: Internal Medicine

## 2019-06-30 ENCOUNTER — Other Ambulatory Visit: Payer: Self-pay

## 2019-06-30 VITALS — BP 126/75 | HR 67 | Temp 98.1°F | Ht 68.0 in | Wt 146.0 lb

## 2019-06-30 DIAGNOSIS — Z9049 Acquired absence of other specified parts of digestive tract: Secondary | ICD-10-CM

## 2019-06-30 DIAGNOSIS — N319 Neuromuscular dysfunction of bladder, unspecified: Secondary | ICD-10-CM

## 2019-06-30 DIAGNOSIS — E063 Autoimmune thyroiditis: Secondary | ICD-10-CM

## 2019-06-30 DIAGNOSIS — E109 Type 1 diabetes mellitus without complications: Secondary | ICD-10-CM

## 2019-06-30 DIAGNOSIS — Z79890 Hormone replacement therapy (postmenopausal): Secondary | ICD-10-CM

## 2019-06-30 DIAGNOSIS — I10 Essential (primary) hypertension: Secondary | ICD-10-CM

## 2019-06-30 DIAGNOSIS — M3500 Sicca syndrome, unspecified: Secondary | ICD-10-CM | POA: Insufficient documentation

## 2019-06-30 DIAGNOSIS — Z9889 Other specified postprocedural states: Secondary | ICD-10-CM

## 2019-06-30 DIAGNOSIS — Z87891 Personal history of nicotine dependence: Secondary | ICD-10-CM

## 2019-06-30 DIAGNOSIS — Z8619 Personal history of other infectious and parasitic diseases: Secondary | ICD-10-CM

## 2019-06-30 DIAGNOSIS — Z90710 Acquired absence of both cervix and uterus: Secondary | ICD-10-CM | POA: Diagnosis not present

## 2019-06-30 DIAGNOSIS — Z9641 Presence of insulin pump (external) (internal): Secondary | ICD-10-CM | POA: Diagnosis not present

## 2019-06-30 DIAGNOSIS — Z8679 Personal history of other diseases of the circulatory system: Secondary | ICD-10-CM

## 2019-06-30 DIAGNOSIS — E1069 Type 1 diabetes mellitus with other specified complication: Secondary | ICD-10-CM

## 2019-06-30 MED ORDER — ONDANSETRON 4 MG PO TBDP: 4.0000 mg | ORAL_TABLET | Freq: Three times a day (TID) | ORAL | 0 refills | Status: DC | PRN

## 2019-06-30 NOTE — Progress Notes (Signed)
CC: Establish care, follow-up thyroid disorder  HPI:  Mary Erickson is a 53 y.o. very pleasant woman with a complex medical history listed below presenting to establish care after recently moving from [place], [place].  Please see problem based charting for further details.  Past Medical History:  Diagnosis Date  . Acid reflux   . Anemia   . Arthritis    "elbows, hands, neck, shoulders" (10/02/2016)  . Calcium blood increased   . Chronic neck pain   . Chronic UTI (urinary tract infection)    "from self caths" (10/02/2016)  . Hepatitis B    acute hepatitis B from lancet  . History of alcoholism ([place])    10/02/2016 "sober since 03/14/2005"  . History of blood transfusion 1984; 1989; 2008   "quite a few"  . History of shortness of breath   . Hypercholesteremia   . Hypertension    pt. states that she has only had low blood pressure  . Hypothyroidism   . Internal carotid aneurysm dx'd early 2017  . Legally blind in left eye, as defined in [place]   . Melanoma ([place])    left upper arm  . Migraines    "quite a few in a month; at least 15" (10/02/2016)  . Neuropathy    neck; "not sure if it's from DM or Sjogren's" (10/02/2016)  . Pneumonia 1986   "1 yr after major OR when I was under anesthesia for 13 hr"  . Seizures ([place]) ~ 2004; ~ 2013   "seizures from diabetes "  . Self-catheterizes urinary bladder    "since OR in 1984" (10/02/2016)  . Sjogren's syndrome ([place])   . Thyroid goiter    "still present" (10/02/2016)  . Type II diabetes mellitus ([place])   . Urinary retention    self caths  . Wears glasses    Review of Systems: As per HPI  Past medical history: See above Past surgical history: Cholecystectomy, cyst removal, exploratory laparotomy for tumor associated with coccyx, history of hepatitis B 3 days ago, hysterectomy, nasal septum surgery, strabismus surgery Family history: Brother with type 1 diabetes mellitus, grandmother with breast cancer, father with  multiple sclerosis, mother with lung cancer Occupation: On for disability Social history: Divorced, used to live in [place] but moved to [place] about a year ago but is moving back to [place] due to management of her complex medical problems.  Quit alcohol use 14 years ago, quit cigarette use 10 years ago.  Family members in the area includes a brother, 2 nieces and a mother.  She has no children  Physical Exam:  Vitals:   06/30/19 0941  BP: 126/75  Pulse: 67  Temp: 98.1 F (36.7 C)  TempSrc: Oral  SpO2: 100%  Weight: 146 lb (66.2 kg)  Height: 5\' 8"  (1.727 m)   Physical Exam  Constitutional: She is oriented to person, place, and time and well-developed, well-nourished, and in no distress.  HENT:  Head: Normocephalic and atraumatic.  Eyes: Conjunctivae are normal.  Neck: Neck supple.  Cardiovascular: Normal rate, regular rhythm and normal heart sounds.  No murmur heard. Pulmonary/Chest: Effort normal and breath sounds normal. No respiratory distress. She has no wheezes. She has no rales.  Abdominal: Soft. Bowel sounds are normal. She exhibits no distension. There is no abdominal tenderness.  Musculoskeletal: Normal range of motion.        General: No tenderness or edema.  Neurological: She is alert and oriented to person, place,  and time.  Psychiatric: Mood and affect normal.    Assessment & Plan:   See Encounters Tab for problem based charting.  Patient discussed with Dr. Evette Doffing

## 2019-06-30 NOTE — Assessment & Plan Note (Signed)
Hashimoto's syndrome: Mary Erickson has a longstanding history of hypothyroidism for which she was taking Synthroid 25 mcg daily.  It seems she was recently diagnosed with Hashimoto's syndrome as of July 2020.  Her most recent labs from July 2020 showed Free T4 of 1, free T3 of 2.9, TSH of 0.8.  Her last thyroid ultrasound with FNA was in 2018 which showed a 1 cm densely calcified right mid thyroid nodule which was supposed to be followed up in a year.  She only complains of intermittent night sweats.  Plan: - Follow-up with endocrinology -Continue Synthroid 25 mcg daily -Was supposed to have repeat thyroid ultrasound in about a year (last ultrasound February 2019)

## 2019-06-30 NOTE — Assessment & Plan Note (Signed)
Hypertension: Blood pressure well controlled on no antihypertensives.  BP Readings from Last 3 Encounters:  06/30/19 126/75  06/17/19 129/87  10/03/16 (!) 101/58   Plan: - Continue to monitor

## 2019-06-30 NOTE — Patient Instructions (Signed)
Mary Erickson,   It was a pleasure taking care of your the clinic today.  As we discussed, this visit was to establish an overall picture of your health.  I have given you a referral to see the endocrinologist and rheumatologist.  I have also given you a refill for Zofran.  Take care! Dr. Eileen Stanford  Please call the internal medicine center clinic if you have any questions or concerns, we may be able to help and keep you from a long and expensive emergency room wait. Our clinic and after hours phone number is [phone], the best time to call is Monday through Friday 9 am to 4 pm but there is always someone available 24/7 if you have an emergency. If you need medication refills please notify your pharmacy one week in advance and they will send [place] a request.

## 2019-06-30 NOTE — Assessment & Plan Note (Signed)
Type 1 diabetes mellitus: She is currently on an insulin pump.  Per chart review, this was diagnosed over 20 years ago.  Her last endocrinologist was Dr. Kallie Locks and was last seen in February 2019.  Her last A1c recorded from February 2019 was 6.3%.  Currently does not report of any hypoglycemic episodes or events.  She is very well educated and knowledgeable on how to monitor and manage her diabetes.  Plan: - Continue insulin pump and follow-up with endocrinology

## 2019-06-30 NOTE — Assessment & Plan Note (Signed)
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  Prior notes state she has been evaluated by urologist named Dr. Burnell Blanks.  Plan: - Continue self-care and follow-up with urology

## 2019-06-30 NOTE — Assessment & Plan Note (Signed)
Sjogren's syndrome: Unsure when this was diagnosed however per chart review, her first encounter with a rheumatologist was July 2017.  She was previously taking Plaquenil 200 mg (1.5 tabs daily).  Last rheumatology visit was February 2019 with Dr. Lesleigh Noe for which she was asked to follow-up in 1 year but has not done so.  Currently she only endorses dry mouth which improves after she hydrates her mouth with water.  Plan: - Follow-up with rheumatologist -Continue cyclosporine ophthalmic emulsion, Naphazoline eyedrops

## 2019-07-01 NOTE — Progress Notes (Signed)
Internal Medicine Clinic Attending  Case discussed with Dr. Agyei at the time of the visit.  We reviewed the resident's history and exam and pertinent patient test results.  I agree with the assessment, diagnosis, and plan of care documented in the resident's note.    

## 2019-07-03 ENCOUNTER — Telehealth: Payer: Self-pay | Admitting: Internal Medicine

## 2019-07-03 ENCOUNTER — Telehealth: Payer: Self-pay | Admitting: *Deleted

## 2019-07-03 NOTE — Telephone Encounter (Signed)
LVM FOR PATIENT TO LET HER KNOW I WAS CALLING HER BACK.

## 2019-07-08 ENCOUNTER — Other Ambulatory Visit (HOSPITAL_COMMUNITY): Payer: Self-pay | Admitting: Student in an Organized Health Care Education/Training Program

## 2019-07-08 ENCOUNTER — Ambulatory Visit
Admission: RE | Admit: 2019-07-08 | Discharge: 2019-07-08 | Disposition: A | Discharge status: Home / Self Care | Payer: Self-pay | Source: Ambulatory Visit | Attending: Student in an Organized Health Care Education/Training Program | Admitting: Student in an Organized Health Care Education/Training Program

## 2019-07-08 DIAGNOSIS — R52 Pain, unspecified: Secondary | ICD-10-CM

## 2019-07-11 ENCOUNTER — Emergency Department (HOSPITAL_COMMUNITY)
Admission: EM | Admit: 2019-07-11 | Discharge: 2019-07-11 | Disposition: A | Discharge status: Home / Self Care | Payer: Medicaid Other | Attending: Emergency Medicine | Admitting: Emergency Medicine

## 2019-07-11 ENCOUNTER — Other Ambulatory Visit: Payer: Self-pay

## 2019-07-11 ENCOUNTER — Encounter (HOSPITAL_COMMUNITY): Payer: Self-pay

## 2019-07-11 DIAGNOSIS — Y999 Unspecified external cause status: Secondary | ICD-10-CM | POA: Diagnosis not present

## 2019-07-11 DIAGNOSIS — Z7982 Long term (current) use of aspirin: Secondary | ICD-10-CM | POA: Diagnosis not present

## 2019-07-11 DIAGNOSIS — I10 Essential (primary) hypertension: Secondary | ICD-10-CM | POA: Insufficient documentation

## 2019-07-11 DIAGNOSIS — R350 Frequency of micturition: Secondary | ICD-10-CM | POA: Insufficient documentation

## 2019-07-11 DIAGNOSIS — S00531A Contusion of lip, initial encounter: Secondary | ICD-10-CM | POA: Diagnosis not present

## 2019-07-11 DIAGNOSIS — S0993XA Unspecified injury of face, initial encounter: Secondary | ICD-10-CM | POA: Diagnosis present

## 2019-07-11 DIAGNOSIS — Z79899 Other long term (current) drug therapy: Secondary | ICD-10-CM | POA: Diagnosis not present

## 2019-07-11 DIAGNOSIS — E039 Hypothyroidism, unspecified: Secondary | ICD-10-CM | POA: Diagnosis not present

## 2019-07-11 DIAGNOSIS — Y929 Unspecified place or not applicable: Secondary | ICD-10-CM | POA: Diagnosis not present

## 2019-07-11 DIAGNOSIS — Z87891 Personal history of nicotine dependence: Secondary | ICD-10-CM | POA: Diagnosis not present

## 2019-07-11 DIAGNOSIS — R232 Flushing: Secondary | ICD-10-CM | POA: Insufficient documentation

## 2019-07-11 DIAGNOSIS — E119 Type 2 diabetes mellitus without complications: Secondary | ICD-10-CM | POA: Insufficient documentation

## 2019-07-11 DIAGNOSIS — Z85820 Personal history of malignant melanoma of skin: Secondary | ICD-10-CM | POA: Insufficient documentation

## 2019-07-11 DIAGNOSIS — X58XXXA Exposure to other specified factors, initial encounter: Secondary | ICD-10-CM | POA: Insufficient documentation

## 2019-07-11 DIAGNOSIS — Y939 Activity, unspecified: Secondary | ICD-10-CM | POA: Diagnosis not present

## 2019-07-11 DIAGNOSIS — R82998 Other abnormal findings in urine: Secondary | ICD-10-CM | POA: Diagnosis not present

## 2019-07-11 DIAGNOSIS — Z794 Long term (current) use of insulin: Secondary | ICD-10-CM | POA: Diagnosis not present

## 2019-07-11 LAB — URINALYSIS, ROUTINE W REFLEX MICROSCOPIC
Bilirubin Urine: NEGATIVE
Glucose, UA: NEGATIVE mg/dL
Hgb urine dipstick: NEGATIVE
Ketones, ur: NEGATIVE mg/dL
Leukocytes,Ua: NEGATIVE
Nitrite: NEGATIVE
Protein, ur: NEGATIVE mg/dL
Specific Gravity, Urine: 1.005 (ref 1.005–1.030)
pH: 6 (ref 5.0–8.0)

## 2019-07-11 LAB — BASIC METABOLIC PANEL
Anion gap: 9 (ref 5–15)
BUN: 13 mg/dL (ref 6–20)
CO2: 28 mmol/L (ref 22–32)
Calcium: 9.6 mg/dL (ref 8.9–10.3)
Chloride: 104 mmol/L (ref 98–111)
Creatinine, Ser: 0.81 mg/dL (ref 0.44–1.00)
GFR calc Af Amer: >60 mL/min (ref >60)
GFR calc non Af Amer: >60 mL/min (ref >60)
Glucose, Bld: 115 mg/dL — ABNORMAL HIGH (ref 70–99)
Potassium: 4.1 mmol/L (ref 3.5–5.1)
Sodium: 141 mmol/L (ref 135–145)

## 2019-07-11 LAB — CBC WITH DIFFERENTIAL/PLATELET
Abs Immature Granulocytes: 0.02 10*3/uL (ref 0.00–0.07)
Basophils Absolute: 0.1 10*3/uL (ref 0.0–0.1)
Basophils Relative: 1 %
Eosinophils Absolute: 0.1 10*3/uL (ref 0.0–0.5)
Eosinophils Relative: 2 %
HCT: 42 % (ref 36.0–46.0)
Hemoglobin: 13.5 g/dL (ref 12.0–15.0)
Immature Granulocytes: 0 %
Lymphocytes Relative: 36 %
Lymphs Abs: 1.8 10*3/uL (ref 0.7–4.0)
MCH: 31 pg (ref 26.0–34.0)
MCHC: 32.1 g/dL (ref 30.0–36.0)
MCV: 96.3 fL (ref 80.0–100.0)
Monocytes Absolute: 0.6 10*3/uL (ref 0.1–1.0)
Monocytes Relative: 11 %
Neutro Abs: 2.5 10*3/uL (ref 1.7–7.7)
Neutrophils Relative %: 50 %
Platelets: 243 10*3/uL (ref 150–400)
RBC: 4.36 MIL/uL (ref 3.87–5.11)
RDW: 13.2 % (ref 11.5–15.5)
WBC: 5 10*3/uL (ref 4.0–10.5)
nRBC: 0 % (ref 0.0–0.2)

## 2019-07-11 NOTE — ED Triage Notes (Signed)
Pt states she feels as though she has a UTI because of her symptoms. The pt also has a bruise-like spot on her lip that has moved toward the inside of her mouth. The pt also states she has hot flashes. The pt states that she has recurrent UTIs related to self-catheterizing.

## 2019-07-11 NOTE — ED Provider Notes (Signed)
[facility] EMERGENCY DEPARTMENT Provider Note   CSN: [id number] Arrival date & time: 07/11/19  1138     History   Chief Complaint Chief Complaint  Patient presents with  . Urinary Frequency    Lower, like a UTI     HPI Mary Erickson is a 53 y.o. female possible history of chronic UTI from self-catheterization, hepatitis B, history of alcoholism, hypercholesterolemia, hypertension, hypothyroidism who presents for evaluation of concern for UTI and discoloration of the lower lip.  Patient states that she has frequent UTIs secondary to self-catheterization which is required from neurogenic bladder.  Reports her last one was in May 2020.  States that she was having some increased urinary frequency and foul odor, prompting concern that she may have a UTI.  Patient states she is also concerned about discoloration noted on her left lower lip that began yesterday.  Patient states that she was worried that it was spreading and getting bigger.  She has sores in her mouth at baseline from her Sjogren's disease.  She states she did not have any trauma.  She states she has not had any fevers but she has had some hot flashes.  She denies any chest pain, difficulty breathing, dysuria, hematuria, abdominal pain, nausea/vomiting, bruising anywhere else.     The history is provided by the patient.    Past Medical History:  Diagnosis Date  . Acid reflux   . Anemia   . Arthritis    "elbows, hands, neck, shoulders" (10/02/2016)  . Calcium blood increased   . Chronic neck pain   . Chronic UTI (urinary tract infection)    "from self caths" (10/02/2016)  . Hepatitis B    acute hepatitis B from lancet  . History of alcoholism ([place])    10/02/2016 "sober since 03/14/2005"  . History of blood transfusion 1984; 1989; 2008   "quite a few"  . History of shortness of breath   . Hypercholesteremia   . Hypertension    pt. states that she has only had low blood pressure  .  Hypothyroidism   . Internal carotid aneurysm dx'd early 2017  . Legally blind in left eye, as defined in [place]   . Melanoma ([place])    left upper arm  . Migraines    "quite a few in a month; at least 15" (10/02/2016)  . Neuropathy    neck; "not sure if it's from DM or Sjogren's" (10/02/2016)  . Pneumonia 1986   "1 yr after major OR when I was under anesthesia for 13 hr"  . Seizures ([place]) ~ 2004; ~ 2013   "seizures from diabetes "  . Self-catheterizes urinary bladder    "since OR in 1984" (10/02/2016)  . Sjogren's syndrome ([place])   . Thyroid goiter    "still present" (10/02/2016)  . Type II diabetes mellitus ([place])   . Urinary retention    self caths  . Wears glasses     Patient Active Problem List   Diagnosis Date Noted  . Type 1 diabetes mellitus ([place]) 06/30/2019  . Hashimoto's disease 06/30/2019  . Sjogren's syndrome ([place]) 06/30/2019  . Hx of Hypertension 06/30/2019  . Neurogenic bladder 06/30/2019  . Malignant melanoma of left upper arm ([place]) 10/02/2016    Past Surgical History:  Procedure Laterality Date  . ABDOMINAL HYSTERECTOMY  2008  . ECTOPIC PREGNANCY SURGERY  1989  . EXCISION MELANOMA WITH SENTINEL LYMPH NODE BIOPSY Left 10/02/2016   Procedure: WIDE LOCAL EXCISION AND ADVANCEMENT FLAP CLOSURE  LEFT UPPER  MELANOMA WITH SENTINEL LYMPH NODE MAPPING AND BIOPSY;  Surgeon: [name], MD;  Location: [facility];  Service: General;  Laterality: Left;  . EYE SURGERY    . LAPAROSCOPIC CHOLECYSTECTOMY  ~ 01/2016  . MELANOMA EXCISION WITH SENTINEL LYMPH NODE BIOPSY Left 10/02/2016   WIDE LOCAL EXCISION AND ADVANCEMENT FLAP CLOSURE LEFT UPPER  MELANOMA WITH SENTINEL LYMPH NODE MAPPING AND BIOPSY  . SHOULDER ARTHROSCOPY W/ ROTATOR CUFF REPAIR Right 2014   "partial"  . STRABISMUS SURGERY Left 1971  . TUMOR EXCISION  1984   from tail bone and part of tail bone removed     OB History   No obstetric history on file.      Home Medications    Prior to Admission medications    Medication Sig Start Date End Date Taking? Authorizing Provider  acetaminophen (ACETAMINOPHEN 8 HOUR) 650 MG CR tablet Take 650 mg by mouth every 12 (twelve) hours as needed for pain.    [provider]  Ascorbic Acid (VITAMIN C) 1000 MG tablet Take 1,000 mg by mouth daily.    [provider]  aspirin EC 81 MG tablet Take 81 mg by mouth daily.    [provider]  atorvastatin (LIPITOR) 40 MG tablet Take 20 mg by mouth daily.     [provider]  Biotin 10000 MCG TABS Take 10 mg by mouth daily.     [provider]  Cholecalciferol (D3-1000) 1000 units capsule Take 1,000 Units by mouth daily.    [provider]  Cyanocobalamin (B-12) 2500 MCG TABS Take 2,500 mcg by mouth daily.     [provider]  cycloSPORINE (RESTASIS) 0.05 % ophthalmic emulsion Place 1 drop into both eyes 2 (two) times daily.    [provider]  estrogens, conjugated, (PREMARIN) 0.45 MG tablet Take 0.45 mg by mouth daily. Take daily for 21 days then do not take for 7 days.    [provider]  folic acid (FOLVITE) Q000111Q MCG tablet Take 800 mcg by mouth daily.    [provider]  gabapentin (NEURONTIN) 300 MG capsule Take 900 mg by mouth at bedtime.     [provider]  Ginkgo Biloba 120 MG CAPS Take 120 mg by mouth daily.     [provider]  insulin aspart (NOVOLOG) 100 UNIT/ML injection Inject 50-60 Units into the skin See admin instructions. Insulin pump basil rate 50-60 units throughout day. Additional 0-5 units per bolos.    [provider]  Insulin Human (INSULIN PUMP) SOLN Inject into the skin.    [provider]  levothyroxine (SYNTHROID, LEVOTHROID) 25 MCG tablet Take 25 mcg by mouth daily.    [provider]  magnesium gluconate (MAGONATE) 500 MG tablet Take 500 mg by mouth daily.    [provider]  Multiple Vitamins-Minerals (MULTIVITAMINS THER. W/MINERALS) TABS Take 1 tablet by  mouth daily.    [provider]  Naphazoline HCl (CLEAR EYES OP) Place 1 drop into both eyes daily.    [provider]  Omega-3 Fatty Acids (FISH OIL) 1200 MG CAPS Take 1,200 mg by mouth 2 (two) times daily.     [provider]  ondansetron (ZOFRAN-ODT) 4 MG disintegrating tablet Take 1 tablet (4 mg total) by mouth every 8 (eight) hours as needed for nausea or vomiting. 06/30/19   Jean Rosenthal, MD  traMADol (ULTRAM) 50 MG tablet Take 1-2 tablets (50-100 mg total) by mouth every 6 (six) hours  as needed for moderate pain or severe pain. 10/02/16   [name], MD  vitamin E (VITAMIN E) 400 UNIT capsule Take 400 Units by mouth daily.    [provider]  Zinc 50 MG TABS Take 50 mg by mouth daily.     [provider]    Family History No family history on file.  Social History Social History   Tobacco Use  . Smoking status: Former Smoker    Packs/day: 1.50    Years: 24.00    Pack years: 36.00    Types: Cigarettes    Quit date: 2008    Years since quitting: 12.6  . Smokeless tobacco: Never Used  Substance Use Topics  . Alcohol use: No    Comment: 10/02/2016 "sober since 03/14/2005"  . Drug use: Yes    Types: Marijuana    Comment: 10/02/2016 " occasional; nothing since before 2006"     Allergies   Doxycycline, Aspirin, Ciprofloxacin, Nsaids, Codeine, Hydrocodone-acetaminophen, Oxycodone, Sulfa antibiotics, and Sulfasalazine   Review of Systems Review of Systems  Constitutional: Negative for fever.  Respiratory: Negative for cough and shortness of breath.   Cardiovascular: Negative for chest pain.  Gastrointestinal: Negative for abdominal pain, nausea and vomiting.  Genitourinary: Positive for frequency. Negative for dysuria and hematuria.  Skin: Positive for color change.  Neurological: Negative for headaches.  All other systems reviewed and are negative.    Physical Exam Updated Vital Signs BP 115/81 (BP Location: Right Arm)    Pulse 71   Temp 98.5 F (36.9 C) (Oral)   Resp 14   Ht 5\' 8"  (1.727 m)   Wt 66.2 kg   SpO2 99%   BMI 22.20 kg/m   Physical Exam Vitals signs and nursing note reviewed.  Constitutional:      Appearance: Normal appearance. She is well-developed.  HENT:     Head: Normocephalic and atraumatic.     Mouth/Throat:      Comments: Purplish discoloration noted to left lower lip.  No evidence of abscess, laceration.  She has a few aphthous ulcers noted to the buccal region of the left cheek that she says has been there for a long time. Eyes:     General: Lids are normal.     Conjunctiva/sclera: Conjunctivae normal.     Pupils: Pupils are equal, round, and reactive to light.  Neck:     Musculoskeletal: Full passive range of motion without pain.  Cardiovascular:     Rate and Rhythm: Normal rate and regular rhythm.     Pulses: Normal pulses.     Heart sounds: Normal heart sounds. No murmur. No friction rub. No gallop.   Pulmonary:     Effort: Pulmonary effort is normal.     Breath sounds: Normal breath sounds.     Comments: Lungs clear to auscultation bilaterally.  Symmetric chest rise.  No wheezing, rales, rhonchi. Abdominal:     Palpations: Abdomen is soft. Abdomen is not rigid.     Tenderness: There is no abdominal tenderness. There is no right CVA tenderness, left CVA tenderness or guarding.     Comments: Abdomen is soft, non-distended, non-tender. No rigidity, No guarding. No peritoneal signs. No CVA tenderness bilaterally.  Musculoskeletal: Normal range of motion.  Skin:    General: Skin is warm and dry.     Capillary Refill: Capillary refill takes less than 2 seconds.     Comments: No rash.  No bruising or rash noted on palms.  Neurological:  Mental Status: She is alert and oriented to person, place, and time.  Psychiatric:        Speech: Speech normal.      ED Treatments / Results  Labs (all labs ordered are listed, but only abnormal results are displayed) Labs  Reviewed  BASIC METABOLIC PANEL - Abnormal; Notable for the following components:      Result Value   Glucose, Bld 115 (*)    All other components within normal limits  URINALYSIS, ROUTINE W REFLEX MICROSCOPIC - Abnormal; Notable for the following components:   Color, Urine STRAW (*)    All other components within normal limits  URINE CULTURE  CBC WITH DIFFERENTIAL/PLATELET    EKG None  Radiology No results found.  Procedures Procedures (including critical care time)  Medications Ordered in ED Medications - No data to display   Initial Impression / Assessment and Plan / ED Course  I have reviewed the triage vital signs and the nursing notes.  Pertinent labs & imaging results that were available during my care of the patient were reviewed by me and considered in my medical decision making (see chart for details).        53 year old female past medical history of Sjogren's, hypertension, neurogenic bladder who presents for evaluation so concerned he may have a UTI.  She states she has noticed some foul-smelling odor and increased urinary frequency.  She reports a history of UTIs secondary to self-catheterization which she mostly because of neurogenic bladder.  Patient has not had any fever, dysuria, hematuria.  Also reports purple discoloration noted on her lip.  Does not know of any trauma or injury.  No fevers, chest pain, difficulty breathing, abdominal pain, nausea/vomiting.  Triage note note initially mentions abdominal pain as her chief complaint.  She states she is not currently having any abdominal pain and did not have any abdominal pain.  She states she occasionally gets lower back pain but currently denies any history of abdominal pain at this time.  BMP is unremarkable.  CBC shows no evidence of leukocytosis.  Hemoglobin and platelets are stable.  UA shows no evidence of infectious etiology.  Given her history of self-catheterization, will plan to send for urine culture.   Discussed results with patient.  At this time, she does not show any signs of hematological abnormality in her blood work.  Additionally, I do not see any evidence of any infectious etiology.  I discussed with her that the discoloration of the lip could be due to trauma.  At this time, I do not feel that she has any infectious process or any abscess that would need I&D here in the emergency department.  Discussed at home supportive care measures and follow-up with PCP. Discussed patient with Dr. Sherry Ruffing who is agreeable to plan. At this time, patient exhibits no emergent life-threatening condition that require further evaluation in ED or admission. Patient had ample opportunity for questions and discussion. All patient's questions were answered with full understanding. Strict return precautions discussed. Patient expresses understanding and agreement to plan.   Portions of this note were generated with [employer]. Dictation errors may occur despite best attempts at proofreading.   Final Clinical Impressions(s) / ED Diagnoses   Final diagnoses:  Contusion of lip, initial encounter    ED Discharge Orders    None       Desma Mcgregor 07/11/19 1902    Tegeler, Gwenyth Allegra, MD 07/11/19 1944

## 2019-07-11 NOTE — Discharge Instructions (Signed)
As we discussed, your work-up today was reassuring.  You did not show any signs of urinary tract infection.  Your urine has been sent for culture.  If it grows anything concerning, you will be notified.  Additionally as we discussed, your lab work was reassuring.  Please follow-up with your primary care doctor regarding your symptoms today.  Return emergency part for any fevers, worsening bruising in abnormal places, vomiting, difficulty breathing or any other worsening or concerning symptoms.

## 2019-07-12 LAB — URINE CULTURE
Culture: NO GROWTH
Special Requests: NORMAL

## 2019-07-16 ENCOUNTER — Telehealth: Payer: Self-pay | Admitting: *Deleted

## 2019-07-16 NOTE — Telephone Encounter (Signed)
Called patient lvm for patient to return call to [facility]/ trying to find office who might be able to see her before 2021.

## 2019-07-23 ENCOUNTER — Telehealth: Payer: Self-pay | Admitting: Internal Medicine

## 2019-07-23 NOTE — Telephone Encounter (Signed)
Pt returning call from [place], pls contact [phone]

## 2019-07-24 NOTE — Addendum Note (Signed)
Addended by: Hulan Fray on: 07/24/2019 01:48 PM   Modules accepted: Orders

## 2019-10-10 ENCOUNTER — Encounter (HOSPITAL_COMMUNITY): Payer: Self-pay | Admitting: Emergency Medicine

## 2019-10-10 ENCOUNTER — Emergency Department (HOSPITAL_COMMUNITY)
Admission: EM | Admit: 2019-10-10 | Discharge: 2019-10-10 | Disposition: A | Discharge status: Home / Self Care | Payer: Medicaid Other | Attending: Emergency Medicine | Admitting: Emergency Medicine

## 2019-10-10 ENCOUNTER — Other Ambulatory Visit: Payer: Self-pay

## 2019-10-10 DIAGNOSIS — E109 Type 1 diabetes mellitus without complications: Secondary | ICD-10-CM | POA: Diagnosis not present

## 2019-10-10 DIAGNOSIS — Z87891 Personal history of nicotine dependence: Secondary | ICD-10-CM | POA: Insufficient documentation

## 2019-10-10 DIAGNOSIS — I10 Essential (primary) hypertension: Secondary | ICD-10-CM | POA: Diagnosis not present

## 2019-10-10 DIAGNOSIS — R0602 Shortness of breath: Secondary | ICD-10-CM | POA: Insufficient documentation

## 2019-10-10 DIAGNOSIS — R531 Weakness: Secondary | ICD-10-CM | POA: Diagnosis not present

## 2019-10-10 DIAGNOSIS — Z79899 Other long term (current) drug therapy: Secondary | ICD-10-CM | POA: Insufficient documentation

## 2019-10-10 DIAGNOSIS — E039 Hypothyroidism, unspecified: Secondary | ICD-10-CM | POA: Insufficient documentation

## 2019-10-10 DIAGNOSIS — Z794 Long term (current) use of insulin: Secondary | ICD-10-CM | POA: Insufficient documentation

## 2019-10-10 LAB — COMPREHENSIVE METABOLIC PANEL
ALT: 28 U/L (ref 0–44)
AST: 30 U/L (ref 15–41)
Albumin: 3.9 g/dL (ref 3.5–5.0)
Alkaline Phosphatase: 81 U/L (ref 38–126)
Anion gap: 8 (ref 5–15)
BUN: 9 mg/dL (ref 6–20)
CO2: 28 mmol/L (ref 22–32)
Calcium: 9.3 mg/dL (ref 8.9–10.3)
Chloride: 101 mmol/L (ref 98–111)
Creatinine, Ser: 0.74 mg/dL (ref 0.44–1.00)
GFR calc Af Amer: >60 mL/min (ref >60)
GFR calc non Af Amer: >60 mL/min (ref >60)
Glucose, Bld: 126 mg/dL — ABNORMAL HIGH (ref 70–99)
Potassium: 4.1 mmol/L (ref 3.5–5.1)
Sodium: 137 mmol/L (ref 135–145)
Total Bilirubin: 1.2 mg/dL (ref 0.3–1.2)
Total Protein: 6.7 g/dL (ref 6.5–8.1)

## 2019-10-10 LAB — CBC
HCT: 40.1 % (ref 36.0–46.0)
Hemoglobin: 13.4 g/dL (ref 12.0–15.0)
MCH: 31.4 pg (ref 26.0–34.0)
MCHC: 33.4 g/dL (ref 30.0–36.0)
MCV: 93.9 fL (ref 80.0–100.0)
Platelets: 266 10*3/uL (ref 150–400)
RBC: 4.27 MIL/uL (ref 3.87–5.11)
RDW: 12.9 % (ref 11.5–15.5)
WBC: 4.9 10*3/uL (ref 4.0–10.5)
nRBC: 0 % (ref 0.0–0.2)

## 2019-10-10 LAB — T4, FREE: Free T4: 1.02 ng/dL (ref 0.61–1.12)

## 2019-10-10 LAB — URINALYSIS, ROUTINE W REFLEX MICROSCOPIC
Bilirubin Urine: NEGATIVE
Glucose, UA: NEGATIVE mg/dL
Hgb urine dipstick: NEGATIVE
Ketones, ur: NEGATIVE mg/dL
Leukocytes,Ua: NEGATIVE
Nitrite: NEGATIVE
Protein, ur: NEGATIVE mg/dL
Specific Gravity, Urine: 1.002 — ABNORMAL LOW (ref 1.005–1.030)
pH: 7 (ref 5.0–8.0)

## 2019-10-10 LAB — TSH: TSH: 0.735 u[IU]/mL (ref 0.350–4.500)

## 2019-10-10 LAB — SEDIMENTATION RATE: Sed Rate: 1 mm/hr (ref 0–22)

## 2019-10-10 LAB — I-STAT BETA HCG BLOOD, ED (MC, WL, AP ONLY): I-stat hCG, quantitative: <5 m[IU]/mL (ref <5)

## 2019-10-10 LAB — CK: Total CK: 187 U/L (ref 38–234)

## 2019-10-10 LAB — C-REACTIVE PROTEIN: CRP: <0.8 mg/dL (ref <1.0)

## 2019-10-10 MED ORDER — PROCHLORPERAZINE MALEATE 10 MG PO TABS: 10.0000 mg | ORAL_TABLET | Freq: Two times a day (BID) | ORAL | 0 refills | Status: DC | PRN

## 2019-10-10 MED ORDER — PREDNISONE 20 MG PO TABS
40.0000 mg | ORAL_TABLET | Freq: Once | ORAL | Status: AC
  Administered 2019-10-10: 15:00:00 40 mg via ORAL
  Filled 2019-10-10: qty 2

## 2019-10-10 MED ORDER — SODIUM CHLORIDE 0.9% FLUSH: 3.0000 mL | Freq: Once | INTRAVENOUS | Status: DC

## 2019-10-10 MED ORDER — PREDNISONE 20 MG PO TABS: 40.0000 mg | ORAL_TABLET | Freq: Every day | ORAL | 0 refills | Status: DC

## 2019-10-10 NOTE — Discharge Instructions (Signed)
As discussed, your evaluation today has been largely reassuring.  But, it is important that you monitor your condition carefully, and do not hesitate to return to the ED if you develop new, or concerning changes in your condition. ? ?Otherwise, please follow-up with your physician for appropriate ongoing care. ? ?

## 2019-10-10 NOTE — ED Triage Notes (Signed)
Pt here for eval of "thyroid storm" Pt has had intermittent racing of her hear, flank pain, and her left eye is swollen and puffy. Pt also reports she has 3 total autoimmune diseases and is having trouble knowing what she can and cannot eat/how much to drink. Pt also reports intermittent sweats/colds chills.

## 2019-10-10 NOTE — ED Provider Notes (Signed)
[facility] EMERGENCY DEPARTMENT Provider Note   CSN: [id number] Arrival date & time: 10/10/19  1142     History   Chief Complaint Chief Complaint  Patient presents with  . Thyroid storm    HPI Mary Erickson is a 53 y.o. female.     HPI Patient with multiple medical issues including Hashimoto's thyroiditis, Sjogren's syndrome, insulin-dependent diabetes presents with multiple complaints per Notes that she has had difficulty with constitutional symptoms for some time, typically has an exacerbation every few months during which she has anorexia, nausea, palpitations, facial edema. However, over the past weeks she is now had 3 similar episodes, and quick succession, including now. She had a chest pain earlier in the week, but none today, has no dyspnea currently, but has felt dyspneic in general.  She also complains of as above, facial edema, intermittent weight gain, loss, alopecia. She notes that she has had titration of her thyroid supplement, is currently taking 25 mcg daily. [place] She has not yet fully established care again with endocrinology, rheumatology.  She also notes that she was supposed to see gastroenterology for endoscopy, but that was canceled due to coronavirus.  Past Medical History:  Diagnosis Date  . Acid reflux   . Anemia   . Arthritis    "elbows, hands, neck, shoulders" (10/02/2016)  . Calcium blood increased   . Chronic neck pain   . Chronic UTI (urinary tract infection)    "from self caths" (10/02/2016)  . Hepatitis B    acute hepatitis B from lancet  . History of alcoholism ([place])    10/02/2016 "sober since 03/14/2005"  . History of blood transfusion 1984; 1989; 2008   "quite a few"  . History of shortness of breath   . Hypercholesteremia   . Hypertension    pt. states that she has only had low blood pressure  . Hypothyroidism   . Internal carotid  aneurysm dx'd early 2017  . Legally blind in left eye, as defined in [place]   . Melanoma ([place])    left upper arm  . Migraines    "quite a few in a month; at least 15" (10/02/2016)  . Neuropathy    neck; "not sure if it's from DM or Sjogren's" (10/02/2016)  . Pneumonia 1986   "1 yr after major OR when I was under anesthesia for 13 hr"  . Seizures ([place]) ~ 2004; ~ 2013   "seizures from diabetes "  . Self-catheterizes urinary bladder    "since OR in 1984" (10/02/2016)  . Sjogren's syndrome ([place])   . Thyroid goiter    "still present" (10/02/2016)  . Type II diabetes mellitus ([place])   . Urinary retention    self caths  . Wears glasses     Patient Active Problem List   Diagnosis Date Noted  . Type 1 diabetes mellitus ([place]) 06/30/2019  . Hashimoto's disease 06/30/2019  . Sjogren's syndrome ([place]) 06/30/2019  . Hx of Hypertension 06/30/2019  . Neurogenic bladder 06/30/2019  . Malignant melanoma of left upper arm ([place]) 10/02/2016    Past Surgical History:  Procedure Laterality Date  . ABDOMINAL HYSTERECTOMY  2008  . ECTOPIC PREGNANCY SURGERY  1989  . EXCISION MELANOMA WITH SENTINEL LYMPH NODE BIOPSY Left 10/02/2016   Procedure: WIDE LOCAL EXCISION AND ADVANCEMENT FLAP CLOSURE LEFT UPPER  MELANOMA WITH SENTINEL LYMPH NODE MAPPING AND BIOPSY;  Surgeon: [name], MD;  Location: [facility] OR;  Service: General;  Laterality: Left;  . EYE SURGERY    . LAPAROSCOPIC CHOLECYSTECTOMY  ~ 01/2016  . MELANOMA EXCISION WITH SENTINEL LYMPH NODE BIOPSY Left 10/02/2016   WIDE LOCAL EXCISION AND ADVANCEMENT FLAP CLOSURE LEFT UPPER  MELANOMA WITH SENTINEL LYMPH NODE MAPPING AND BIOPSY  . SHOULDER ARTHROSCOPY W/ ROTATOR CUFF REPAIR Right 2014   "partial"  . STRABISMUS SURGERY Left 1971  . TUMOR EXCISION  1984   from tail bone and part of tail bone removed     OB History   No obstetric history on file.      Home Medications    Prior to Admission medications   Medication Sig Start Date End Date  Taking? Authorizing Provider  Ascorbic Acid (VITAMIN C) 1000 MG tablet Take 1,000 mg by mouth daily.   Yes [provider]  Cholecalciferol (D3-1000) 25 MCG (1000 UT) capsule Take 1,000 Units by mouth daily.    Yes [provider]  Cyanocobalamin (B-12) 2500 MCG TABS Take 2,500 mcg by mouth daily.    Yes [provider]  cycloSPORINE (RESTASIS) 0.05 % ophthalmic emulsion Place 1 drop into both eyes 2 (two) times daily.   Yes [provider]  ibuprofen (ADVIL) 200 MG tablet Take 200-400 mg by mouth every 6 (six) hours as needed for mild pain.   Yes [provider]  insulin aspart (NOVOLOG) 100 UNIT/ML injection Inject 50-60 Units into the skin See admin instructions. Insulin pump basil rate 50-60 units throughout day. Additional 0-5 units per bolos.   Yes [provider]  Insulin Detemir (LEVEMIR FLEXTOUCH) 100 UNIT/ML Pen Inject 7 Units into the skin 2 (two) times daily as needed (in case pump breaks).  09/27/17  Yes [provider]  Insulin Human (INSULIN PUMP) SOLN Inject into the skin.   Yes [provider]  Magnesium 400 MG CAPS Take 400 mg by mouth daily.   Yes [provider]  Multiple Vitamins-Minerals (MULTIVITAMINS THER. W/MINERALS) TABS Take 1 tablet by mouth daily.   Yes [provider]  Naphazoline HCl (CLEAR EYES OP) Place 1 drop into both eyes daily.   Yes [provider]  naproxen sodium (ALEVE) 220 MG tablet Take 220 mg by mouth as needed (pain).   Yes [provider]  Omega-3 Fatty Acids (FISH OIL) 1200 MG CAPS Take 1,200 mg by mouth 2 (two) times daily.    Yes [provider]  omeprazole (PRILOSEC) 40 MG capsule Take 40 mg by mouth daily. 05/04/19  Yes [provider]  ondansetron (ZOFRAN-ODT) 4 MG disintegrating tablet Take 1 tablet (4 mg total) by mouth every 8 (eight) hours as needed for nausea or vomiting. 06/30/19  Yes Agyei, Caprice Kluver, MD  TIROSINT 25 MCG  CAPS Take 25 mcg by mouth daily. 07/24/19  Yes [provider]  vitamin E (VITAMIN E) 400 UNIT capsule Take 400 Units by mouth daily.   Yes [provider]  White Petrolatum-Mineral Oil [facility] PETROL-MINERAL OIL-LANOLIN) 0.1-0.1 % OINT Apply 1 application to eye every evening.   Yes [provider]  Zinc 50 MG TABS Take 50 mg by mouth daily.    Yes [provider]  traMADol (ULTRAM) 50 MG tablet Take 1-2 tablets (50-100 mg total) by mouth every 6 (six) hours as needed for moderate pain or severe pain. Patient not taking: Reported on 10/10/2019 10/02/16   [name], MD    Family History No family history on file.  Social History  Social History   Tobacco Use  . Smoking status: Former Smoker    Packs/day: 1.50    Years: 24.00    Pack years: 36.00    Types: Cigarettes    Quit date: 2008    Years since quitting: 12.9  . Smokeless tobacco: Never Used  Substance Use Topics  . Alcohol use: No    Comment: 10/02/2016 "sober since 03/14/2005"  . Drug use: Yes    Types: Marijuana    Comment: 10/02/2016 " occasional; nothing since before 2006"     Allergies   Doxycycline, Aspirin, Ciprofloxacin, Nsaids, Amoxicillin-pot clavulanate, Codeine, Hydrocodone-acetaminophen, Oxycodone, Sulfa antibiotics, and Sulfasalazine   Review of Systems Review of Systems  Constitutional:       Per HPI, otherwise negative  HENT:       Per HPI, otherwise negative  Eyes: Negative for visual disturbance.  Respiratory:       Per HPI, otherwise negative  Cardiovascular:       Per HPI, otherwise negative  Gastrointestinal: Positive for nausea. Negative for vomiting.  Endocrine:       Negative aside from HPI  Genitourinary:       Neg aside from HPI   Musculoskeletal:       Per HPI, otherwise negative  Skin: Negative.   Allergic/Immunologic:       Per HPI  Neurological: Positive for weakness. Negative for syncope.     Physical Exam Updated Vital Signs BP 121/85    Pulse 64   Temp 98.5 F (36.9 C) (Oral)   Resp 11   SpO2 100%   Physical Exam Vitals signs and nursing note reviewed.  Constitutional:      General: She is not in acute distress.    Appearance: She is well-developed.  HENT:     Head: Normocephalic and atraumatic.  Eyes:     Conjunctiva/sclera: Conjunctivae normal.  Cardiovascular:     Rate and Rhythm: Normal rate and regular rhythm.  Pulmonary:     Effort: Pulmonary effort is normal. No respiratory distress.     Breath sounds: Normal breath sounds. No stridor.  Abdominal:     General: There is no distension.  Skin:    General: Skin is warm and dry.  Neurological:     Mental Status: She is alert and oriented to person, place, and time.     Cranial Nerves: No cranial nerve deficit.      ED Treatments / Results  Labs (all labs ordered are listed, but only abnormal results are displayed) Labs Reviewed  COMPREHENSIVE METABOLIC PANEL - Abnormal; Notable for the following components:      Result Value   Glucose, Bld 126 (*)    All other components within normal limits  URINALYSIS, ROUTINE W REFLEX MICROSCOPIC - Abnormal; Notable for the following components:   Color, Urine COLORLESS (*)    Specific Gravity, Urine 1.002 (*)    All other components within normal limits  CBC  TSH  T4, FREE  SEDIMENTATION RATE  CK  C-REACTIVE PROTEIN  I-STAT BETA HCG BLOOD, ED ([facility], [facility], [facility] ONLY)    EKG EKG Interpretation  Date/Time:  Saturday October 10 2019 12:04:58 EST Ventricular Rate:  77 PR Interval:  152 QRS Duration: 78 QT Interval:  354 QTC Calculation: 400 R Axis:   86 Text Interpretation: Normal sinus rhythm Baseline wander Otherwise within normal limits Confirmed by Carmin Muskrat [id number]) on 10/10/2019 12:22:19 PM   Radiology No results found.  Procedures Procedures (including critical care time)  Medications Ordered in ED Medications  sodium chloride flush (NS) 0.9 % injection 3 mL (has no administration in  time range)  predniSONE (DELTASONE) tablet 40 mg (has no administration in time range)     Initial Impression / Assessment and Plan / ED Course  I have reviewed the triage vital signs and the nursing notes.  Pertinent labs & imaging results that were available during my care of the patient were reviewed by me and considered in my medical decision making (see chart for details).        2:57 PM Patient in no distress, awake, alert, sitting upright, speaking clearly she remains hemodynamically unremarkable.  We discussed all findings including labs that were reassuring, including thyroid studies, CBC, chemistry, inflammatory markers. With no evidence for acute infection, hemodynamic stability, substantial electrolyte abnormalities, there are some suspicion for flare of her anti-inflammatory disease. We discussed the importance of following up with rheumatology, and as previously discussed with GI given her anorexia, nausea. As above, patient appropriate for discharge, though with close outpatient follow-up and return precautions.  Final Clinical Impressions(s) / ED Diagnoses   Final diagnoses:  Shortness of breath  Weakness    ED Discharge Orders         Ordered    predniSONE (DELTASONE) 20 MG tablet  Daily with breakfast     10/10/19 1459           Carmin Muskrat, MD 10/10/19 1500

## 2019-12-07 ENCOUNTER — Other Ambulatory Visit: Payer: Self-pay

## 2019-12-07 ENCOUNTER — Inpatient Hospital Stay (HOSPITAL_COMMUNITY)
Admission: EM | Admit: 2019-12-07 | Discharge: 2019-12-09 | DRG: 392 | Disposition: A | Discharge status: Home / Self Care | Payer: Medicaid Other | Attending: Student in an Organized Health Care Education/Training Program | Admitting: Student in an Organized Health Care Education/Training Program

## 2019-12-07 ENCOUNTER — Emergency Department (HOSPITAL_COMMUNITY): Payer: Medicaid Other

## 2019-12-07 DIAGNOSIS — Z882 Allergy status to sulfonamides status: Secondary | ICD-10-CM

## 2019-12-07 DIAGNOSIS — Z90710 Acquired absence of both cervix and uterus: Secondary | ICD-10-CM

## 2019-12-07 DIAGNOSIS — Z79890 Hormone replacement therapy (postmenopausal): Secondary | ICD-10-CM

## 2019-12-07 DIAGNOSIS — Z881 Allergy status to other antibiotic agents status: Secondary | ICD-10-CM

## 2019-12-07 DIAGNOSIS — E7800 Pure hypercholesterolemia, unspecified: Secondary | ICD-10-CM | POA: Diagnosis present

## 2019-12-07 DIAGNOSIS — A09 Infectious gastroenteritis and colitis, unspecified: Principal | ICD-10-CM | POA: Diagnosis present

## 2019-12-07 DIAGNOSIS — Z886 Allergy status to analgesic agent status: Secondary | ICD-10-CM

## 2019-12-07 DIAGNOSIS — M3500 Sicca syndrome, unspecified: Secondary | ICD-10-CM | POA: Diagnosis present

## 2019-12-07 DIAGNOSIS — G8929 Other chronic pain: Secondary | ICD-10-CM | POA: Diagnosis present

## 2019-12-07 DIAGNOSIS — Z794 Long term (current) use of insulin: Secondary | ICD-10-CM

## 2019-12-07 DIAGNOSIS — K529 Noninfective gastroenteritis and colitis, unspecified: Secondary | ICD-10-CM | POA: Diagnosis present

## 2019-12-07 DIAGNOSIS — K219 Gastro-esophageal reflux disease without esophagitis: Secondary | ICD-10-CM | POA: Diagnosis present

## 2019-12-07 DIAGNOSIS — Z885 Allergy status to narcotic agent status: Secondary | ICD-10-CM

## 2019-12-07 DIAGNOSIS — E860 Dehydration: Secondary | ICD-10-CM | POA: Diagnosis present

## 2019-12-07 DIAGNOSIS — Z833 Family history of diabetes mellitus: Secondary | ICD-10-CM

## 2019-12-07 DIAGNOSIS — E109 Type 1 diabetes mellitus without complications: Secondary | ICD-10-CM | POA: Diagnosis present

## 2019-12-07 DIAGNOSIS — Z9641 Presence of insulin pump (external) (internal): Secondary | ICD-10-CM | POA: Diagnosis present

## 2019-12-07 DIAGNOSIS — Z87891 Personal history of nicotine dependence: Secondary | ICD-10-CM

## 2019-12-07 DIAGNOSIS — Z880 Allergy status to penicillin: Secondary | ICD-10-CM

## 2019-12-07 DIAGNOSIS — M542 Cervicalgia: Secondary | ICD-10-CM | POA: Diagnosis present

## 2019-12-07 DIAGNOSIS — H548 Legal blindness, as defined in USA: Secondary | ICD-10-CM | POA: Diagnosis present

## 2019-12-07 DIAGNOSIS — I951 Orthostatic hypotension: Secondary | ICD-10-CM | POA: Diagnosis present

## 2019-12-07 DIAGNOSIS — Z20822 Contact with and (suspected) exposure to covid-19: Secondary | ICD-10-CM | POA: Diagnosis present

## 2019-12-07 DIAGNOSIS — Z87440 Personal history of urinary (tract) infections: Secondary | ICD-10-CM

## 2019-12-07 DIAGNOSIS — Z85820 Personal history of malignant melanoma of skin: Secondary | ICD-10-CM

## 2019-12-07 DIAGNOSIS — E872 Acidosis: Secondary | ICD-10-CM | POA: Diagnosis present

## 2019-12-07 DIAGNOSIS — I10 Essential (primary) hypertension: Secondary | ICD-10-CM | POA: Diagnosis present

## 2019-12-07 DIAGNOSIS — E063 Autoimmune thyroiditis: Secondary | ICD-10-CM | POA: Diagnosis present

## 2019-12-07 DIAGNOSIS — Z79899 Other long term (current) drug therapy: Secondary | ICD-10-CM

## 2019-12-07 DIAGNOSIS — Z791 Long term (current) use of non-steroidal anti-inflammatories (NSAID): Secondary | ICD-10-CM

## 2019-12-07 LAB — I-STAT CHEM 8, ED
BUN: 19 mg/dL (ref 6–20)
BUN: 22 mg/dL — ABNORMAL HIGH (ref 6–20)
Calcium, Ion: 1.14 mmol/L — ABNORMAL LOW (ref 1.15–1.40)
Calcium, Ion: 1.21 mmol/L (ref 1.15–1.40)
Chloride: 102 mmol/L (ref 98–111)
Chloride: 102 mmol/L (ref 98–111)
Creatinine, Ser: 0.6 mg/dL (ref 0.44–1.00)
Creatinine, Ser: 0.6 mg/dL (ref 0.44–1.00)
Glucose, Bld: 198 mg/dL — ABNORMAL HIGH (ref 70–99)
Glucose, Bld: 268 mg/dL — ABNORMAL HIGH (ref 70–99)
HCT: 41 % (ref 36.0–46.0)
HCT: 41 % (ref 36.0–46.0)
Hemoglobin: 13.9 g/dL (ref 12.0–15.0)
Hemoglobin: 13.9 g/dL (ref 12.0–15.0)
Potassium: 4.4 mmol/L (ref 3.5–5.1)
Potassium: 7.1 mmol/L (ref 3.5–5.1)
Sodium: 134 mmol/L — ABNORMAL LOW (ref 135–145)
Sodium: 135 mmol/L (ref 135–145)
TCO2: 27 mmol/L (ref 22–32)
TCO2: 29 mmol/L (ref 22–32)

## 2019-12-07 LAB — CBC WITH DIFFERENTIAL/PLATELET
Abs Immature Granulocytes: 0.02 10*3/uL (ref 0.00–0.07)
Abs Immature Granulocytes: 0.03 10*3/uL (ref 0.00–0.07)
Abs Immature Granulocytes: 0.12 10*3/uL — ABNORMAL HIGH (ref 0.00–0.07)
Basophils Absolute: 0 10*3/uL (ref 0.0–0.1)
Basophils Absolute: 0 10*3/uL (ref 0.0–0.1)
Basophils Absolute: 0.1 10*3/uL (ref 0.0–0.1)
Basophils Relative: 0 %
Basophils Relative: 1 %
Basophils Relative: 0 %
Eosinophils Absolute: 0 10*3/uL (ref 0.0–0.5)
Eosinophils Absolute: 0 10*3/uL (ref 0.0–0.5)
Eosinophils Absolute: 0 10*3/uL (ref 0.0–0.5)
Eosinophils Relative: 1 %
Eosinophils Relative: 1 %
Eosinophils Relative: 0 %
HCT: 16.6 % — ABNORMAL LOW (ref 36.0–46.0)
HCT: 17.6 % — ABNORMAL LOW (ref 36.0–46.0)
HCT: 39.4 % (ref 36.0–46.0)
Hemoglobin: 4.9 g/dL — CL (ref 12.0–15.0)
Hemoglobin: 5.6 g/dL — CL (ref 12.0–15.0)
Hemoglobin: 13.3 g/dL (ref 12.0–15.0)
Immature Granulocytes: 0 %
Immature Granulocytes: 1 %
Immature Granulocytes: 1 %
Lymphocytes Relative: 15 %
Lymphocytes Relative: 24 %
Lymphocytes Relative: 6 %
Lymphs Abs: 0.7 10*3/uL (ref 0.7–4.0)
Lymphs Abs: 1.3 10*3/uL (ref 0.7–4.0)
Lymphs Abs: 1.3 10*3/uL (ref 0.7–4.0)
MCH: 31.8 pg (ref 26.0–34.0)
MCH: 32.6 pg (ref 26.0–34.0)
MCH: 31.4 pg (ref 26.0–34.0)
MCHC: 29.5 g/dL — ABNORMAL LOW (ref 30.0–36.0)
MCHC: 31.8 g/dL (ref 30.0–36.0)
MCHC: 33.8 g/dL (ref 30.0–36.0)
MCV: 102.3 fL — ABNORMAL HIGH (ref 80.0–100.0)
MCV: 107.8 fL — ABNORMAL HIGH (ref 80.0–100.0)
MCV: 92.9 fL (ref 80.0–100.0)
Monocytes Absolute: 0.4 10*3/uL (ref 0.1–1.0)
Monocytes Absolute: 0.4 10*3/uL (ref 0.1–1.0)
Monocytes Absolute: 1.2 10*3/uL — ABNORMAL HIGH (ref 0.1–1.0)
Monocytes Relative: 7 %
Monocytes Relative: 9 %
Monocytes Relative: 6 %
Neutro Abs: 3.3 10*3/uL (ref 1.7–7.7)
Neutro Abs: 3.7 10*3/uL (ref 1.7–7.7)
Neutro Abs: 18 10*3/uL — ABNORMAL HIGH (ref 1.7–7.7)
Neutrophils Relative %: 68 %
Neutrophils Relative %: 73 %
Neutrophils Relative %: 87 %
Platelets: 73 10*3/uL — ABNORMAL LOW (ref 150–400)
Platelets: 80 10*3/uL — ABNORMAL LOW (ref 150–400)
Platelets: 249 10*3/uL (ref 150–400)
RBC: 1.54 MIL/uL — ABNORMAL LOW (ref 3.87–5.11)
RBC: 1.72 MIL/uL — ABNORMAL LOW (ref 3.87–5.11)
RBC: 4.24 MIL/uL (ref 3.87–5.11)
RDW: 13.4 % (ref 11.5–15.5)
RDW: 13.5 % (ref 11.5–15.5)
RDW: 13.4 % (ref 11.5–15.5)
WBC: 4.4 10*3/uL (ref 4.0–10.5)
WBC: 5.5 10*3/uL (ref 4.0–10.5)
WBC: 20.6 10*3/uL — ABNORMAL HIGH (ref 4.0–10.5)
nRBC: 0 % (ref 0.0–0.2)
nRBC: 0 % (ref 0.0–0.2)
nRBC: 0 % (ref 0.0–0.2)

## 2019-12-07 LAB — URINALYSIS, ROUTINE W REFLEX MICROSCOPIC
Bacteria, UA: NONE SEEN
Bilirubin Urine: NEGATIVE
Glucose, UA: >=500 mg/dL — AB
Hgb urine dipstick: NEGATIVE
Ketones, ur: 20 mg/dL — AB
Nitrite: NEGATIVE
Protein, ur: NEGATIVE mg/dL
Specific Gravity, Urine: 1.043 — ABNORMAL HIGH (ref 1.005–1.030)
pH: 6 (ref 5.0–8.0)

## 2019-12-07 LAB — TYPE AND SCREEN
ABO/RH(D): O POS
Antibody Screen: NEGATIVE

## 2019-12-07 LAB — COMPREHENSIVE METABOLIC PANEL
ALT: 10 U/L (ref 0–44)
AST: 9 U/L — ABNORMAL LOW (ref 15–41)
Albumin: 1.2 g/dL — ABNORMAL LOW (ref 3.5–5.0)
Alkaline Phosphatase: 27 U/L — ABNORMAL LOW (ref 38–126)
Anion gap: 4 — ABNORMAL LOW (ref 5–15)
BUN: 8 mg/dL (ref 6–20)
CO2: 11 mmol/L — ABNORMAL LOW (ref 22–32)
Calcium: <4 mg/dL — CL (ref 8.9–10.3)
Chloride: 129 mmol/L — ABNORMAL HIGH (ref 98–111)
Creatinine, Ser: <0.3 mg/dL — ABNORMAL LOW (ref 0.44–1.00)
Glucose, Bld: 94 mg/dL (ref 70–99)
Potassium: <2 mmol/L — CL (ref 3.5–5.1)
Sodium: 144 mmol/L (ref 135–145)
Total Bilirubin: 0.4 mg/dL (ref 0.3–1.2)
Total Protein: <3 g/dL — ABNORMAL LOW (ref 6.5–8.1)

## 2019-12-07 LAB — LIPASE, BLOOD: Lipase: 16 U/L (ref 11–51)

## 2019-12-07 LAB — IRON AND TIBC: Iron: 20 ug/dL — ABNORMAL LOW (ref 28–170)

## 2019-12-07 LAB — ABO/RH: ABO/RH(D): O POS

## 2019-12-07 LAB — GLUCOSE, CAPILLARY
Glucose-Capillary: 196 mg/dL — ABNORMAL HIGH (ref 70–99)
Glucose-Capillary: 143 mg/dL — ABNORMAL HIGH (ref 70–99)

## 2019-12-07 LAB — BASIC METABOLIC PANEL
Anion gap: 15 (ref 5–15)
BUN: 11 mg/dL (ref 6–20)
CO2: 17 mmol/L — ABNORMAL LOW (ref 22–32)
Calcium: 7.9 mg/dL — ABNORMAL LOW (ref 8.9–10.3)
Chloride: 102 mmol/L (ref 98–111)
Creatinine, Ser: 0.52 mg/dL (ref 0.44–1.00)
GFR calc Af Amer: >60 mL/min (ref >60)
GFR calc non Af Amer: >60 mL/min (ref >60)
Glucose, Bld: 134 mg/dL — ABNORMAL HIGH (ref 70–99)
Potassium: 5.4 mmol/L — ABNORMAL HIGH (ref 3.5–5.1)
Sodium: 134 mmol/L — ABNORMAL LOW (ref 135–145)

## 2019-12-07 LAB — RETICULOCYTES
Immature Retic Fract: 4 % (ref 2.3–15.9)
RBC.: 1.55 MIL/uL — ABNORMAL LOW (ref 3.87–5.11)
Retic Count, Absolute: 18 10*3/uL — ABNORMAL LOW (ref 19.0–186.0)
Retic Ct Pct: 1.2 % (ref 0.4–3.1)

## 2019-12-07 LAB — VITAMIN B12: Vitamin B-12: >7500 pg/mL — ABNORMAL HIGH (ref 180–914)

## 2019-12-07 LAB — LACTIC ACID, PLASMA
Lactic Acid, Venous: 1.6 mmol/L (ref 0.5–1.9)
Lactic Acid, Venous: 3.8 mmol/L (ref 0.5–1.9)

## 2019-12-07 LAB — FERRITIN: Ferritin: 6 ng/mL — ABNORMAL LOW (ref 11–307)

## 2019-12-07 LAB — C DIFFICILE QUICK SCREEN W PCR REFLEX
C Diff antigen: NEGATIVE
C Diff interpretation: NOT DETECTED
C Diff toxin: NEGATIVE

## 2019-12-07 LAB — SARS CORONAVIRUS 2 (TAT 6-24 HRS): SARS Coronavirus 2: NEGATIVE

## 2019-12-07 LAB — HIV ANTIBODY (ROUTINE TESTING W REFLEX): HIV Screen 4th Generation wRfx: NONREACTIVE

## 2019-12-07 LAB — FOLATE: Folate: 8.3 ng/mL (ref >5.9)

## 2019-12-07 LAB — CBG MONITORING, ED: Glucose-Capillary: 268 mg/dL — ABNORMAL HIGH (ref 70–99)

## 2019-12-07 MED ORDER — VITAMIN D 25 MCG (1000 UNIT) PO TABS
1000.0000 [IU] | ORAL_TABLET | Freq: Every day | ORAL | Status: DC
  Administered 2019-12-08 – 2019-12-09 (×2): 1000 [IU] via ORAL
  Filled 2019-12-07 (×2): qty 1

## 2019-12-07 MED ORDER — PROCHLORPERAZINE EDISYLATE 10 MG/2ML IJ SOLN
10.0000 mg | Freq: Once | INTRAMUSCULAR | Status: AC
  Administered 2019-12-07: 10 mg via INTRAVENOUS
  Filled 2019-12-07: qty 2

## 2019-12-07 MED ORDER — FENTANYL CITRATE (PF) 100 MCG/2ML IJ SOLN
50.0000 ug | Freq: Once | INTRAMUSCULAR | Status: AC
  Administered 2019-12-07: 05:00:00 50 ug via INTRAVENOUS
  Filled 2019-12-07: qty 2

## 2019-12-07 MED ORDER — MORPHINE SULFATE (PF) 2 MG/ML IV SOLN
2.0000 mg | Freq: Once | INTRAVENOUS | Status: AC
  Administered 2019-12-07: 2 mg via INTRAVENOUS
  Filled 2019-12-07: qty 1

## 2019-12-07 MED ORDER — METRONIDAZOLE 500 MG PO TABS: 500.0000 mg | ORAL_TABLET | Freq: Two times a day (BID) | ORAL | 0 refills | Status: DC

## 2019-12-07 MED ORDER — LACTATED RINGERS IV SOLN
INTRAVENOUS | Status: AC
  Administered 2019-12-07: 1000 mL via INTRAVENOUS

## 2019-12-07 MED ORDER — LEVOTHYROXINE SODIUM 50 MCG PO TABS
50.0000 ug | ORAL_TABLET | Freq: Every day | ORAL | Status: DC
  Administered 2019-12-08 – 2019-12-09 (×2): 50 ug via ORAL
  Filled 2019-12-07 (×3): qty 1

## 2019-12-07 MED ORDER — CIPROFLOXACIN HCL 500 MG PO TABS
500.0000 mg | ORAL_TABLET | Freq: Once | ORAL | Status: AC
  Administered 2019-12-07: 500 mg via ORAL
  Filled 2019-12-07: qty 1

## 2019-12-07 MED ORDER — ACETAMINOPHEN 325 MG PO TABS
650.0000 mg | ORAL_TABLET | Freq: Four times a day (QID) | ORAL | Status: DC | PRN
  Administered 2019-12-08 – 2019-12-09 (×5): 650 mg via ORAL
  Filled 2019-12-07 (×5): qty 2

## 2019-12-07 MED ORDER — AZITHROMYCIN 250 MG PO TABS
1000.0000 mg | ORAL_TABLET | Freq: Once | ORAL | Status: AC
  Administered 2019-12-08: 16:00:00 1000 mg via ORAL
  Filled 2019-12-07: qty 4

## 2019-12-07 MED ORDER — ONDANSETRON HCL 4 MG/2ML IJ SOLN
4.0000 mg | Freq: Once | INTRAMUSCULAR | Status: AC
  Administered 2019-12-07: 07:00:00 4 mg via INTRAVENOUS
  Filled 2019-12-07: qty 2

## 2019-12-07 MED ORDER — IOHEXOL 300 MG/ML  SOLN
100.0000 mL | Freq: Once | INTRAMUSCULAR | Status: AC | PRN
  Administered 2019-12-07: 100 mL via INTRAVENOUS

## 2019-12-07 MED ORDER — PROMETHAZINE HCL 25 MG PO TABS: 25.0000 mg | ORAL_TABLET | Freq: Four times a day (QID) | ORAL | 0 refills | Status: DC | PRN

## 2019-12-07 MED ORDER — METHYLPREDNISOLONE SODIUM SUCC 125 MG IJ SOLR
125.0000 mg | Freq: Once | INTRAMUSCULAR | Status: AC
  Administered 2019-12-07: 125 mg via INTRAVENOUS
  Filled 2019-12-07: qty 2

## 2019-12-07 MED ORDER — DICYCLOMINE HCL 10 MG PO CAPS
10.0000 mg | ORAL_CAPSULE | Freq: Once | ORAL | Status: AC
  Administered 2019-12-07: 10 mg via ORAL
  Filled 2019-12-07: qty 1

## 2019-12-07 MED ORDER — LACTATED RINGERS IV BOLUS
1000.0000 mL | Freq: Once | INTRAVENOUS | Status: AC
  Administered 2019-12-07: 11:00:00 1000 mL via INTRAVENOUS

## 2019-12-07 MED ORDER — LACTATED RINGERS IV BOLUS
1000.0000 mL | Freq: Once | INTRAVENOUS | Status: AC
  Administered 2019-12-07: 1000 mL via INTRAVENOUS

## 2019-12-07 MED ORDER — HYDROMORPHONE HCL 1 MG/ML IJ SOLN
1.0000 mg | Freq: Once | INTRAMUSCULAR | Status: AC
  Administered 2019-12-07: 1 mg via INTRAVENOUS

## 2019-12-07 MED ORDER — VITAMIN B-12 1000 MCG PO TABS
2500.0000 ug | ORAL_TABLET | Freq: Every day | ORAL | Status: DC
  Administered 2019-12-08 – 2019-12-09 (×2): 2500 ug via ORAL
  Filled 2019-12-07 (×2): qty 3

## 2019-12-07 MED ORDER — ONDANSETRON HCL 4 MG PO TABS: 4.0000 mg | ORAL_TABLET | Freq: Four times a day (QID) | ORAL | Status: DC | PRN

## 2019-12-07 MED ORDER — METRONIDAZOLE 500 MG PO TABS
500.0000 mg | ORAL_TABLET | Freq: Once | ORAL | Status: AC
  Administered 2019-12-07: 500 mg via ORAL
  Filled 2019-12-07: qty 1

## 2019-12-07 MED ORDER — AZITHROMYCIN 250 MG PO TABS: 1000.0000 mg | ORAL_TABLET | Freq: Every day | ORAL | Status: DC

## 2019-12-07 MED ORDER — PROMETHAZINE HCL 25 MG/ML IJ SOLN
12.5000 mg | Freq: Once | INTRAMUSCULAR | Status: AC
  Administered 2019-12-07: 06:00:00 12.5 mg via INTRAVENOUS
  Filled 2019-12-07: qty 1

## 2019-12-07 MED ORDER — ENOXAPARIN SODIUM 40 MG/0.4ML ~~LOC~~ SOLN
40.0000 mg | SUBCUTANEOUS | Status: DC
  Administered 2019-12-08 – 2019-12-09 (×2): 40 mg via SUBCUTANEOUS
  Filled 2019-12-07 (×2): qty 0.4

## 2019-12-07 MED ORDER — SODIUM CHLORIDE 0.9% FLUSH
3.0000 mL | Freq: Two times a day (BID) | INTRAVENOUS | Status: DC
  Administered 2019-12-07 – 2019-12-09 (×4): 3 mL via INTRAVENOUS

## 2019-12-07 MED ORDER — CYCLOSPORINE 0.05 % OP EMUL
1.0000 [drp] | Freq: Two times a day (BID) | OPHTHALMIC | Status: DC
  Administered 2019-12-07 – 2019-12-09 (×4): 1 [drp] via OPHTHALMIC
  Filled 2019-12-07 (×6): qty 1

## 2019-12-07 MED ORDER — CIPROFLOXACIN HCL 500 MG PO TABS: 500.0000 mg | ORAL_TABLET | Freq: Two times a day (BID) | ORAL | 0 refills | Status: DC

## 2019-12-07 MED ORDER — ONDANSETRON HCL 4 MG/2ML IJ SOLN
4.0000 mg | Freq: Four times a day (QID) | INTRAMUSCULAR | Status: DC | PRN
  Administered 2019-12-07 – 2019-12-08 (×3): 4 mg via INTRAVENOUS
  Filled 2019-12-07 (×4): qty 2

## 2019-12-07 MED ORDER — ONDANSETRON 4 MG PO TBDP: 4.0000 mg | ORAL_TABLET | Freq: Three times a day (TID) | ORAL | 0 refills | Status: DC | PRN

## 2019-12-07 MED ORDER — ASCORBIC ACID 500 MG PO TABS
1000.0000 mg | ORAL_TABLET | Freq: Every day | ORAL | Status: DC
  Administered 2019-12-08 – 2019-12-09 (×2): 1000 mg via ORAL
  Filled 2019-12-07 (×2): qty 2

## 2019-12-07 MED ORDER — INSULIN PUMP
Freq: Three times a day (TID) | SUBCUTANEOUS | Status: DC
  Administered 2019-12-07: 2.15 via SUBCUTANEOUS
  Administered 2019-12-08: 1.1 via SUBCUTANEOUS
  Filled 2019-12-07: qty 1

## 2019-12-07 MED ORDER — HYDROMORPHONE HCL 1 MG/ML PO LIQD
1.0000 mg | Freq: Once | ORAL | Status: DC
  Filled 2019-12-07 (×2): qty 1

## 2019-12-07 NOTE — ED Notes (Signed)
Pt BP 73/52 when sitting on side of bed after attempting to stand up and walk in room. Returned pt to lying position, BP increased to 94/52. MD made aware.

## 2019-12-07 NOTE — ED Notes (Signed)
Medications given and charted per MAR. Tolerated well. No distress noted. 

## 2019-12-07 NOTE — Discharge Instructions (Addendum)
Be sure to drink plenty of fluids. Follow up closely with your primary care physician.  If you develop worsening, continued, or recurrent abdominal pain, uncontrolled vomiting, fever, chest or back pain, or any other new/concerning symptoms then return to the ER for evaluation.

## 2019-12-07 NOTE — ED Notes (Signed)
Help with in and out cath on patient

## 2019-12-07 NOTE — ED Provider Notes (Addendum)
Care transferred to me.  Patient's CT scan shows colitis.  This correlates with her significant amount of diarrhea.  Patient feels better from a nausea perspective and has no abdominal tenderness on exam.  Her C. difficile testing was negative.  I will give antibiotics for the colitis.  She states she is allergic to IV Cipro but tolerates oral Cipro fine.  Given the nature of her illness I think Cipro would be the best choice and we will also give Flagyl.  If she can tolerate these I think she is okay to go home after discussion with her.  She does wonder if she is still significantly dehydrated and so she will be given a 3rd liter.  Given the output describe I the nurse I think this is reasonable.  Feels better after fluids.  However when she stood up to see how she would do, she did start to feel lightheaded and her blood pressure was measured at 70s.  Now up to 90 at rest.  While I think she is clinically feeling better, the degree of diarrhea she has has likely given her a fluid deficit and I think she needs to be admitted for at least 24 hours for further fluids.    Sherwood Gambler, MD 12/07/19 1351

## 2019-12-07 NOTE — Progress Notes (Signed)
1L Bolus completed, BP 81/53. MD paged, see new orders. Patient complaining of dizziness while lying in bed.

## 2019-12-07 NOTE — ED Notes (Signed)
Pt resting in bed. No distress noted. Will continue to monitor.  

## 2019-12-07 NOTE — ED Provider Notes (Signed)
[facility] DEPARTMENT Provider Note   CSN: [id number] Arrival date & time: 12/07/19  [id number]     History Chief Complaint  Patient presents with  . Nausea    Mary Erickson is a 54 y.o. female.  The history is provided by the patient.  Emesis Severity:  Mild Duration:  12 hours Timing:  Constant Quality:  Stomach contents Progression since onset: improved, now starting to come back. Chronicity:  New Recent urination:  Increased Relieved by:  Nothing Worsened by:  Nothing Ineffective treatments:  None tried Associated symptoms: abdominal pain, diarrhea and myalgias   Associated symptoms: no chills, no cough, no fever, no headaches and no sore throat   Risk factors: diabetes   Risk factors: not pregnant, no sick contacts and no suspect food intake        Past Medical History:  Diagnosis Date  . Acid reflux   . Anemia   . Arthritis    "elbows, hands, neck, shoulders" (10/02/2016)  . Calcium blood increased   . Chronic neck pain   . Chronic UTI (urinary tract infection)    "from self caths" (10/02/2016)  . Hepatitis B    acute hepatitis B from lancet  . History of alcoholism ([place])    10/02/2016 "sober since 03/14/2005"  . History of blood transfusion 1984; 1989; 2008   "quite a few"  . History of shortness of breath   . Hypercholesteremia   . Hypertension    pt. states that she has only had low blood pressure  . Hypothyroidism   . Internal carotid aneurysm dx'd early 2017  . Legally blind in left eye, as defined in [place]   . Melanoma ([place])    left upper arm  . Migraines    "quite a few in a month; at least 15" (10/02/2016)  . Neuropathy    neck; "not sure if it's from DM or Sjogren's" (10/02/2016)  . Pneumonia 1986   "1 yr after major OR when I was under anesthesia for 13 hr"  . Seizures ([place]) ~ 2004; ~ 2013   "seizures from diabetes "  . Self-catheterizes urinary bladder    "since OR in 1984" (10/02/2016)  . Sjogren's  syndrome ([place])   . Thyroid goiter    "still present" (10/02/2016)  . Type II diabetes mellitus ([place])   . Urinary retention    self caths  . Wears glasses     Patient Active Problem List   Diagnosis Date Noted  . Type 1 diabetes mellitus ([place]) 06/30/2019  . Hashimoto's disease 06/30/2019  . Sjogren's syndrome ([place]) 06/30/2019  . Hx of Hypertension 06/30/2019  . Neurogenic bladder 06/30/2019  . Malignant melanoma of left upper arm ([place]) 10/02/2016    Past Surgical History:  Procedure Laterality Date  . ABDOMINAL HYSTERECTOMY  2008  . ECTOPIC PREGNANCY SURGERY  1989  . EXCISION MELANOMA WITH SENTINEL LYMPH NODE BIOPSY Left 10/02/2016   Procedure: WIDE LOCAL EXCISION AND ADVANCEMENT FLAP CLOSURE LEFT UPPER  MELANOMA WITH SENTINEL LYMPH NODE MAPPING AND BIOPSY;  Surgeon: [name], MD;  Location: [facility];  Service: General;  Laterality: Left;  . EYE SURGERY    . LAPAROSCOPIC CHOLECYSTECTOMY  ~ 01/2016  . MELANOMA EXCISION WITH SENTINEL LYMPH NODE BIOPSY Left 10/02/2016   WIDE LOCAL EXCISION AND ADVANCEMENT FLAP CLOSURE LEFT UPPER  MELANOMA WITH SENTINEL LYMPH NODE MAPPING AND BIOPSY  . SHOULDER ARTHROSCOPY W/ ROTATOR CUFF REPAIR Right 2014   "partial"  . STRABISMUS SURGERY Left  1971  . TUMOR EXCISION  1984   from tail bone and part of tail bone removed     OB History   No obstetric history on file.     No family history on file.  Social History   Tobacco Use  . Smoking status: Former Smoker    Packs/day: 1.50    Years: 24.00    Pack years: 36.00    Types: Cigarettes    Quit date: 2008    Years since quitting: 13.0  . Smokeless tobacco: Never Used  Substance Use Topics  . Alcohol use: No    Comment: 10/02/2016 "sober since 03/14/2005"  . Drug use: Yes    Types: Marijuana    Comment: 10/02/2016 " occasional; nothing since before 2006"    Home Medications Prior to Admission medications   Medication Sig Start Date End Date Taking? Authorizing Provider  Ascorbic  Acid (VITAMIN C) 1000 MG tablet Take 1,000 mg by mouth daily.    [provider]  Cholecalciferol (D3-1000) 25 MCG (1000 UT) capsule Take 1,000 Units by mouth daily.     [provider]  Cyanocobalamin (B-12) 2500 MCG TABS Take 2,500 mcg by mouth daily.     [provider]  cycloSPORINE (RESTASIS) 0.05 % ophthalmic emulsion Place 1 drop into both eyes 2 (two) times daily.    [provider]  ibuprofen (ADVIL) 200 MG tablet Take 200-400 mg by mouth every 6 (six) hours as needed for mild pain.    [provider]  insulin aspart (NOVOLOG) 100 UNIT/ML injection Inject 50-60 Units into the skin See admin instructions. Insulin pump basil rate 50-60 units throughout day. Additional 0-5 units per bolos.    [provider]  Insulin Detemir (LEVEMIR FLEXTOUCH) 100 UNIT/ML Pen Inject 7 Units into the skin 2 (two) times daily as needed (in case pump breaks).  09/27/17   [provider]  Insulin Human (INSULIN PUMP) SOLN Inject into the skin.    [provider]  Magnesium 400 MG CAPS Take 400 mg by mouth daily.    [provider]  Multiple Vitamins-Minerals (MULTIVITAMINS THER. W/MINERALS) TABS Take 1 tablet by mouth daily.    [provider]  Naphazoline HCl (CLEAR EYES OP) Place 1 drop into both eyes daily.    [provider]  naproxen sodium (ALEVE) 220 MG tablet Take 220 mg by mouth as needed (pain).    [provider]  Omega-3 Fatty Acids (FISH OIL) 1200 MG CAPS Take 1,200 mg by mouth 2 (two) times daily.     [provider]  omeprazole (PRILOSEC) 40 MG capsule Take 40 mg by mouth daily. 05/04/19   [provider]  ondansetron (ZOFRAN-ODT) 4 MG disintegrating tablet Take 1 tablet (4 mg total) by mouth every 8 (eight) hours as needed for nausea or vomiting. 06/30/19   Jean Rosenthal, MD  predniSONE (DELTASONE) 20 MG tablet Take 2 tablets (40 mg total) by mouth daily with breakfast. For the  next four days 10/10/19   Jacqlyn Larsen, PA-C  prochlorperazine (COMPAZINE) 10 MG tablet Take 1 tablet (10 mg total) by mouth 2 (two) times daily as needed for nausea or vomiting. 10/10/19   Jacqlyn Larsen, PA-C  TIROSINT 25 MCG CAPS Take 25 mcg by mouth daily. 07/24/19   [provider]  traMADol (ULTRAM) 50 MG tablet Take 1-2 tablets (50-100 mg total) by mouth every 6 (six) hours as needed for moderate pain or severe pain. Patient not  taking: Reported on 10/10/2019 10/02/16   [name], MD  vitamin E (VITAMIN E) 400 UNIT capsule Take 400 Units by mouth daily.    [provider]  White Petrolatum-Mineral Oil [facility] PETROL-MINERAL OIL-LANOLIN) 0.1-0.1 % OINT Apply 1 application to eye every evening.    [provider]  Zinc 50 MG TABS Take 50 mg by mouth daily.     [provider]    Allergies    Doxycycline, Aspirin, Ciprofloxacin, Nsaids, Amoxicillin-pot clavulanate, Codeine, Hydrocodone-acetaminophen, Oxycodone, Sulfa antibiotics, and Sulfasalazine  Review of Systems   Review of Systems  Constitutional: Negative for chills and fever.  HENT: Negative for sore throat.   Respiratory: Negative for cough.   Gastrointestinal: Positive for abdominal pain, diarrhea and vomiting.  Musculoskeletal: Positive for myalgias.  Neurological: Negative for headaches.  All other systems reviewed and are negative.   Physical Exam Updated Vital Signs BP 113/67   Pulse 65   Temp 98 F (36.7 C) (Oral)   Resp 12   SpO2 100%   Physical Exam Vitals and nursing note reviewed.  Constitutional:      Appearance: She is well-developed.  HENT:     Head: Normocephalic and atraumatic.     Mouth/Throat:     Mouth: Mucous membranes are dry.     Pharynx: Oropharynx is clear.  Eyes:     Conjunctiva/sclera: Conjunctivae normal.  Cardiovascular:     Rate and Rhythm: Normal rate and regular rhythm.  Pulmonary:     Effort: No respiratory distress.     Breath sounds: No  stridor. No wheezing.  Abdominal:     General: There is no distension.  Musculoskeletal:        General: No swelling or tenderness. Normal range of motion.     Cervical back: Normal range of motion.  Skin:    General: Skin is warm and dry.  Neurological:     General: No focal deficit present.     Mental Status: She is alert.     ED Results / Procedures / Treatments   Labs (all labs ordered are listed, but only abnormal results are displayed) Labs Reviewed  CBC WITH DIFFERENTIAL/PLATELET - Abnormal; Notable for the following components:      Result Value   RBC 1.72 (*)    Hemoglobin 5.6 (*)    HCT 17.6 (*)    MCV 102.3 (*)    Platelets 80 (*)    All other components within normal limits  COMPREHENSIVE METABOLIC PANEL - Abnormal; Notable for the following components:   Potassium <2.0 (*)    Chloride 129 (*)    CO2 11 (*)    Creatinine, Ser <0.30 (*)    Calcium <4.0 (*)    Total Protein <3.0 (*)    Albumin 1.2 (*)    AST 9 (*)    Alkaline Phosphatase 27 (*)    Anion gap 4 (*)    All other components within normal limits  CBC WITH DIFFERENTIAL/PLATELET - Abnormal; Notable for the following components:   RBC 1.54 (*)    Hemoglobin 4.9 (*)    HCT 16.6 (*)    MCV 107.8 (*)    MCHC 29.5 (*)    Platelets 73 (*)    All other components within normal limits  RETICULOCYTES - Abnormal; Notable for the following components:   RBC. 1.55 (*)    Retic Count, Absolute 18.0 (*)    All other components within normal limits  CBC WITH DIFFERENTIAL/PLATELET - Abnormal; Notable  for the following components:   WBC 20.6 (*)    Neutro Abs 18.0 (*)    Monocytes Absolute 1.2 (*)    Abs Immature Granulocytes 0.12 (*)    All other components within normal limits  BASIC METABOLIC PANEL - Abnormal; Notable for the following components:   Sodium 134 (*)    Potassium 5.4 (*)    CO2 17 (*)    Glucose, Bld 134 (*)    Calcium 7.9 (*)    All other components within normal limits  CBG  MONITORING, ED - Abnormal; Notable for the following components:   Glucose-Capillary 268 (*)    All other components within normal limits  I-STAT CHEM 8, ED - Abnormal; Notable for the following components:   Sodium 134 (*)    Potassium 7.1 (*)    BUN 22 (*)    Glucose, Bld 198 (*)    Calcium, Ion 1.14 (*)    All other components within normal limits  I-STAT CHEM 8, ED - Abnormal; Notable for the following components:   Glucose, Bld 268 (*)    All other components within normal limits  URINE CULTURE  GI PATHOGEN PANEL BY PCR, STOOL  C DIFFICILE QUICK SCREEN W PCR REFLEX  LIPASE, BLOOD  FOLATE  URINALYSIS, ROUTINE W REFLEX MICROSCOPIC  VITAMIN B12  IRON AND TIBC  FERRITIN  TYPE AND SCREEN    EKG None   My ECG Read Indication:nausea, epigastric pain EKG was personally contemporaneously reviewed by myself. Rate: 61 PR Interval: 200 QRS duration: 85 QT/QTC: 388/391 Axis: normal EKG: normal EKG, normal sinus rhythm, unchanged from previous tracings. Other significant findings: none   Radiology No results found.  Procedures Procedures (including critical care time)  Medications Ordered in ED Medications  lactated ringers bolus 1,000 mL (has no administration in time range)  morphine 2 MG/ML injection 2 mg (has no administration in time range)  ondansetron (ZOFRAN) injection 4 mg (has no administration in time range)  lactated ringers bolus 1,000 mL (0 mLs Intravenous Stopped 12/07/19 0453)  prochlorperazine (COMPAZINE) injection 10 mg (10 mg Intravenous Given 12/07/19 0344)  methylPREDNISolone sodium succinate (SOLU-MEDROL) 125 mg/2 mL injection 125 mg (125 mg Intravenous Given 12/07/19 0345)  fentaNYL (SUBLIMAZE) injection 50 mcg (50 mcg Intravenous Given 12/07/19 0454)  dicyclomine (BENTYL) capsule 10 mg (10 mg Oral Given 12/07/19 0505)  promethazine (PHENERGAN) injection 12.5 mg (12.5 mg Intravenous Given 12/07/19 0555)    ED Course  I have reviewed the triage  vital signs and the nursing notes.  Pertinent labs & imaging results that were available during my care of the patient were reviewed by me and considered in my medical decision making (see chart for details).    MDM Rules/Calculators/A&P  54 yo F here with vomiting and diarhea. Will check labs, improve symptoms, reeval for need fo rimaigng, however at this time seems like gastroenteritis.  Informed of multiple critical lab abnormalities however on review of her labs it appears that maybe there was some saline in the line as her chloride and sodium are disproportionately high compared to significantly low potassium, calcium, protein, bicarb.  These may be true to include her hemoglobin being 5.6 however in the setting of no tachycardia, hypotension, normal EKG no chest pain, syncope and a relatively normal physical exam aside from the cramping and abdominal discomfort I think is unlikely.  We will hold on treating these and recheck them.  Repeat labs are more consistent with clinical picture. I suspect previous labs  wee drawn upstream for LR infusion or with flush in the line. She should not be charged for first two cbc's, first istat chem 8 or first CMP  Patient still with nausea, but hasn't tried eating. Still with diffuse abdominal pain and minor ttp. No rebound/ guarding. Still pending urinalysis, will give another liter of fluids, morphine, zofran. At this point, will need CT scan to ensure no abdominal issues.    Final Clinical Impression(s) / ED Diagnoses Final diagnoses:  Generalized abdominal pain  Dehydration  Diarrhea, unspecified type  Nausea and vomiting, intractability of vomiting not specified, unspecified vomiting type    Rx / DC Orders ED Discharge Orders    None       She should not be charged for first two cbc's, first istat chem 8 or first CMP   Taleisha Kaczynski, Corene Cornea, MD 12/07/19 [phone]

## 2019-12-07 NOTE — Plan of Care (Signed)
  Problem: Education: Goal: Knowledge of General Education information will improve Description: Including pain rating scale, medication(s)/side effects and non-pharmacologic comfort measures Outcome: Progressing   Problem: Health Behavior/Discharge Planning: Goal: Ability to manage health-related needs will improve Outcome: Progressing   Problem: Clinical Measurements: Goal: Will remain free from infection Outcome: Progressing   Problem: Pain Managment: Goal: General experience of comfort will improve Outcome: Progressing   Problem: Safety: Goal: Ability to remain free from injury will improve Outcome: Progressing   Problem: Skin Integrity: Goal: Risk for impaired skin integrity will decrease Outcome: Progressing

## 2019-12-07 NOTE — Progress Notes (Addendum)
CRITICAL VALUE ALERT  Critical Value:  Lactic acid=3.8 from 1.6  Date & Time Notied:  12/07/19; 2045  Provider Notified: Dr. Lindwood Qua  Orders Received/Actions taken: Awaiting further order.   Received order for 1L LR bolus and Lactic acid blood draw at 0000.  Administered med as ordered.

## 2019-12-07 NOTE — H&P (Addendum)
Date: 12/07/2019               Patient Name:  Mary Erickson MRN: [id number]  DOB: 1966/09/14 Age / Sex: 54 y.o., female   PCP: Mary Girt, DO         Medical Service: Internal Medicine Teaching Service         Attending Physician: Dr. Evette Erickson, Mary Erickson, *    First Contact: Dr. Ladona Erickson Pager: [phone]  Second Contact: Dr. Modena Erickson Pager: [phone]       After Hours (After 5p/  First Contact Pager: [phone]  weekends / holidays): Second Contact Pager: [phone]   Chief Complaint: diarrhea   History of Present Illness:  [name] is a 54 year old F with significant PMH of type I diabetes, Hashimoto's, Sjogren's syndrome, and HTN, who presented to the emergency room for a 1 day history of worsening diarrhea. Pt states that she was in her usual state of health until yesterday afternoon when she began to have abdominal pain. Initially she attributed the abdominal pain to gas pains as it was waxing and waning. Unfortunately, the pain became more persistent and then the pt started having diarrhea after dinnertime yesterday evening. Pt says she was needing to use the restroom every 15-20 minutes. Her stools were initially formed and soft and then became liquid and watery. Over the course of the night, pt started feeling weak and as though she was going to pass out so she called EMS. Denies any blood or maroon/black in the stool. Denies eating any expired food or un/undercooked meats. Her last meal before this illness was some [place] chicken and vegetables that she made herself.  In the ED, pt was afebrile, borderline bradycardic (HR 50), and normotensive (BP 106/70). Lab work was remarkable for a leukocytosis with WBC 20.6, Hgb 13.3, bicarb 17, and Cr 0.52. UA with spec gravity 1.043, >500 Glc, 20 ketones, moderate leukocytes, 0-5 RBCs, and 6-10 WBCs. Rapid C diff negative, GI pathogen panel pending. CT abdomen significant for moderate colitis of the descending and  sigmoid colon. She is documented as having 5.8L in stool output. She was given 4L LR. Upon attempting to ambulate the pt, she became lightheaded with BP 73/52, which resolved to 94/52 upon laying down.  Meds: Current Meds  Medication Sig  . Ascorbic Acid (VITAMIN C) 1000 MG tablet Take 1,000 mg by mouth daily.  . Cholecalciferol (D3-1000) 25 MCG (1000 UT) capsule Take 1,000 Units by mouth daily.   . Cyanocobalamin (B-12) 2500 MCG TABS Take 2,500 mcg by mouth daily.   . cycloSPORINE (RESTASIS) 0.05 % ophthalmic emulsion Place 1 drop into both eyes 2 (two) times daily.  Marland Kitchen ibuprofen (ADVIL) 200 MG tablet Take 200-400 mg by mouth every 6 (six) hours as needed for mild pain.  Marland Kitchen insulin aspart (NOVOLOG) 100 UNIT/ML injection Inject 50-60 Units into the skin See admin instructions. Insulin pump basil rate 50-60 units throughout day. Additional 0-5 units per bolos.  . Insulin Detemir (LEVEMIR FLEXTOUCH) 100 UNIT/ML Pen Inject 7 Units into the skin 2 (two) times daily as needed (in case pump breaks).   . Insulin Human (INSULIN PUMP) SOLN Inject into the skin.  . Levothyroxine Sodium 50 MCG CAPS Take 50 mcg by mouth daily.  . Magnesium 400 MG CAPS Take 400 mg by mouth daily.  . Multiple Vitamins-Minerals (MULTIVITAMINS THER. W/MINERALS) TABS Take 1 tablet by mouth daily.  . Naphazoline HCl (CLEAR EYES OP) Place 1  drop into both eyes daily.  . naproxen sodium (ALEVE) 220 MG tablet Take 220 mg by mouth as needed (pain).  . Omega-3 Fatty Acids (FISH OIL) 1200 MG CAPS Take 1,200 mg by mouth 2 (two) times daily.   Marland Kitchen omeprazole (PRILOSEC) 40 MG capsule Take 40 mg by mouth daily.  . prochlorperazine (COMPAZINE) 10 MG tablet Take 1 tablet (10 mg total) by mouth 2 (two) times daily as needed for nausea or vomiting.  . vitamin E (VITAMIN E) 400 UNIT capsule Take 400 Units by mouth daily.  Marland Kitchen White Petrolatum-Mineral Oil (Zoar PETROL-MINERAL OIL-LANOLIN) 0.1-0.1 % OINT Apply 1 application to eye every evening.  .  Zinc 50 MG TABS Take 50 mg by mouth daily.   . [DISCONTINUED] ondansetron (ZOFRAN-ODT) 4 MG disintegrating tablet Take 1 tablet (4 mg total) by mouth every 8 (eight) hours as needed for nausea or vomiting.  . [DISCONTINUED] TIROSINT 25 MCG CAPS Take 25 mcg by mouth daily.   Allergies: Allergies as of 12/07/2019 - Review Complete 12/07/2019  Allergen Reaction Noted  . Doxycycline Other (See Comments) 04/08/2014  . Aspirin Other (See Comments) 02/29/2016  . Ciprofloxacin Hives 04/06/2014  . Nsaids Other (See Comments) 04/10/2016  . Amoxicillin-pot clavulanate Nausea Only 12/30/2017  . Codeine Nausea And Vomiting 01/18/2012  . Hydrocodone-acetaminophen Nausea And Vomiting 01/31/2016  . Oxycodone Nausea And Vomiting 01/31/2016  . Sulfa antibiotics Nausea And Vomiting 01/18/2012  . Sulfasalazine Nausea And Vomiting 01/18/2012   Past Medical History:  Diagnosis Date  . Acid reflux   . Anemia   . Arthritis    "elbows, hands, neck, shoulders" (10/02/2016)  . Calcium blood increased   . Chronic neck pain   . Chronic UTI (urinary tract infection)    "from self caths" (10/02/2016)  . Hepatitis B    acute hepatitis B from lancet  . History of alcoholism ([place])    10/02/2016 "sober since 03/14/2005"  . History of blood transfusion 1984; 1989; 2008   "quite a few"  . History of shortness of breath   . Hypercholesteremia   . Hypertension    pt. states that she has only had low blood pressure  . Hypothyroidism   . Internal carotid aneurysm dx'd early 2017  . Legally blind in left eye, as defined in [place]   . Melanoma ([place])    left upper arm  . Migraines    "quite a few in a month; at least 15" (10/02/2016)  . Neuropathy    neck; "not sure if it's from DM or Sjogren's" (10/02/2016)  . Pneumonia 1986   "1 yr after major OR when I was under anesthesia for 13 hr"  . Seizures ([place]) ~ 2004; ~ 2013   "seizures from diabetes "  . Self-catheterizes urinary bladder    "since OR in 1984"  (10/02/2016)  . Sjogren's syndrome ([place])   . Thyroid goiter    "still present" (10/02/2016)  . Type II diabetes mellitus ([place])   . Urinary retention    self caths  . Wears glasses     Family History:  Father with primary progressive MS. Brother with type I diabetes. Denies family history of Crohn's or ulcerative colitis.   Social History:  Pt moved to [place] in July 2020 from [place], [place] to be closer to medical care. Lives in an apartment by herself. Pt endorses her greatest support system is Warden/ranger and her psychologist Doroteo Bradford, who she sees approx once per month. Pt likes to spend her time reading,  walking, and practicing gratitude.  She quit smoking 10 years ago, and drinking alcohol 15 years ago.   Review of Systems: Review of Systems  Constitutional: Positive for malaise/fatigue. Negative for chills and fever.  HENT: Negative for congestion and sore throat.   Eyes: Positive for pain (dry).  Respiratory: Negative for cough and shortness of breath.   Cardiovascular: Negative for chest pain and leg swelling.  Gastrointestinal: Positive for abdominal pain, diarrhea, nausea and vomiting. Negative for blood in stool, constipation, heartburn and melena.  Genitourinary: Positive for frequency. Negative for dysuria and urgency.  Musculoskeletal: Negative for myalgias.  Skin: Negative for rash.  Neurological: Positive for dizziness. Negative for headaches.     Physical Exam: Blood pressure (!) 91/56, pulse 88, temperature 98 F (36.7 C), temperature source Oral, resp. rate 16, SpO2 98 %. Physical Exam Vitals and nursing note reviewed.  Constitutional:      General: She is in acute distress (mild distress).  HENT:     Head: Normocephalic and atraumatic.     Mouth/Throat:     Mouth: Mucous membranes are dry.  Eyes:     Extraocular Movements: Extraocular movements intact.  Cardiovascular:     Rate and Rhythm: Normal rate and regular rhythm.     Heart sounds:  Normal heart sounds.  Pulmonary:     Effort: Pulmonary effort is normal. No tachypnea or respiratory distress.     Breath sounds: Normal breath sounds.  Abdominal:     General: Abdomen is flat. Bowel sounds are normal. There is no distension.     Palpations: Abdomen is soft.     Tenderness: There is abdominal tenderness in the left lower quadrant. There is no guarding or rebound.  Musculoskeletal:     Right lower leg: No edema.     Left lower leg: No edema.  Skin:    General: Skin is warm and dry.  Neurological:     General: No focal deficit present.     Mental Status: She is alert and oriented to person, place, and time.  Psychiatric:        Mood and Affect: Mood normal.    EKG: personally reviewed my interpretation is sinus bradycardia without ST segment changes  CT abdomen/pelvis: IMPRESSION: Moderate colitis involving the descending and sigmoid colon. No evidence of abscess or other complication.  Assessment & Plan by Problem: Active Problems:   Colitis, acute  Ms. Negro is a 54 year old F with significant PMH of type I diabetes, Hashimoto's, Sjogren's syndrome, and HTN, who presented with severe acute onset diarrhea with CT imaging remarkable for acute colitis.  Acute colitis Pt presented with less than 12 hours of severe diarrhea and associated abdominal pain. CT abdomen remarkable for moderate colitis of the descending and sigmoid colon. Lab work with non-anion gap metabolic acidosis consistent with severe diarrhea. Documented as having 5.8L stool output. Pt endorsing that her BM frequency has decreased since presenting to the hospital. Of note, pt was seen by GI at [facility] on 10/15/2019 for chronic nausea. Plan at that time was for a trial of linaclotide, low res diet, EGD/colonoscopy, and send out labs for Celiac. However, pt opted to defer any procedures on 11/18/2019. Her diarrheal illness seems most likely to be infectious at this time (bacterial vs viral) with her  leukocytosis and acute onset. Could also consider Celiac vs IBD given pt's extensive autoimmune history. - continue supportive care - LR @ 175mL/hr - ondansetron available q6h PRN - azithromycin 1,000mg  PO  once - follow-up GI pathogen panel - continue electrolyte monitoring - clear diet   Type I diabetes Pt's endocrinologist is Dr. Kathlene Cote at [facility]. Per last office visit in 08/2019, pt has had very well controlled diabetes with A1c's in the 6's." Endorsed some nausea/vomiting symptoms at that time concerning for gastroparesis, though she had an emptying study 1 year prior which was negative. Instructed to try a gastroparesis diet.  - please allow pt to use her own insulin pump at home  Hashimoto's Monitored by pt's endocrinologist as above. Last seen in 08/2019 with plan to repeat TPO and TSI antibodies as well as thyroid US to consider discontinuation of levothyroxine therapy. FNA in 01/2016 consistent with benign follicular nodules. TSH in 10/10/19 0.735, with free T4 1.02. - continue levothyroxine 81mcg daily  Sjogren's syndrome Pt's rheumatologist is Dr. Hermelinda Medicus at [facility]. She was last seen in 10/15/2019, and instructed to complete a prednisone taper (to be completed 12/16) after an episode of nausea/dizziness/dysphoria for which she was seen in the ED.  - continue cyclosporine eye drops   Diet - clears Fluids - 171mL/hr LR DVT ppx - enoxaparin 40mg  subQ daily CODE STATUS - FULL CODE   Dispo: Admit patient to Observation with expected length of stay less than 2 midnights.  Signed: Ladona Horns, MD 12/07/2019, 3:10 PM  Pager: [phone]

## 2019-12-07 NOTE — ED Notes (Signed)
Pt transported to CT ?

## 2019-12-07 NOTE — Progress Notes (Signed)
Inpatient Diabetes Program Recommendations  AACE/ADA: New Consensus Statement on Inpatient Glycemic Control (2015)  Target Ranges:  Prepandial:   less than 140 mg/dL      Peak postprandial:   less than 180 mg/dL (1-2 hours)      Critically ill patients:  140 - 180 mg/dL   Results for Mary Erickson, Mary Erickson (MRN [id number]) as of 12/07/2019 10:22  Ref. Range 12/07/2019 06:12  Glucose-Capillary Latest Ref Range: 70 - 99 mg/dL 268 (H)    Admit with: Nausea/ Abd Pain/ Possible Colitis  History: Type 1 Diabetes  Home DM Meds: Insulin Pump (Omni Pod)  Current Insulin Orders: Insulin Pump    Given 125 mg Solumedrol X 1 dose at 3:45am  Endocrinologist: Dr. Kathlene Cote with [facility] seen 09/07/2019--Pump settings at that visit wer as follows: Basal Rates: 12am- 0.45units/hr 6:30am- 0.55 units/hr Total Basal per 24 hour period= 12.55 units Insulin to Carb Ratio: 12am- 1 unit for every 12 grams Carbs 6am- 1 unit for every 11 grams Carbs Sensitivity Factor: 1 unit for every 50 mg/dl above goal CBG Goal CBG 100 mg/dl   Met w/ pt today to assess current insulin pump settings.  Pt verbally affirmed pump settings are the same as those listed above when pt last met with her ENDO.  Pt was alert and oriented and able to converse w/ me however she was feeling very nauseated.  Discussed w/ pt that she received Solumedrol in the ED this AM and this may raise her CBGs temporarily.  Pt appreciative of my visit.  Rosary Lively, RN caring for pt.  Reviewed all insulin pump charting with RN and asked RN to make sure we are checking pt's CBGs with the hospital meter.    --Will follow patient during hospitalization--  Wyn Quaker RN, MSN, CDE Diabetes Coordinator Inpatient Glycemic Control Team Team Pager: [phone] (8a-5p)

## 2019-12-07 NOTE — ED Notes (Signed)
Additional PIV initiated, 20G to L forearm. IV flushes with 10 cc NS without s/s of infiltration. Positive blood return noted. Secured with tape and tegaderm

## 2019-12-07 NOTE — Progress Notes (Signed)
Pt arrived to 6N13 via stretcher from the ED. Received report from Derek Jack, Therapist, sports. See assessment. Will continue to monitor.

## 2019-12-08 DIAGNOSIS — Z886 Allergy status to analgesic agent status: Secondary | ICD-10-CM | POA: Diagnosis not present

## 2019-12-08 DIAGNOSIS — I951 Orthostatic hypotension: Secondary | ICD-10-CM | POA: Diagnosis present

## 2019-12-08 DIAGNOSIS — E109 Type 1 diabetes mellitus without complications: Secondary | ICD-10-CM

## 2019-12-08 DIAGNOSIS — E063 Autoimmune thyroiditis: Secondary | ICD-10-CM | POA: Diagnosis present

## 2019-12-08 DIAGNOSIS — Z79899 Other long term (current) drug therapy: Secondary | ICD-10-CM | POA: Diagnosis not present

## 2019-12-08 DIAGNOSIS — Z794 Long term (current) use of insulin: Secondary | ICD-10-CM | POA: Diagnosis not present

## 2019-12-08 DIAGNOSIS — Z79890 Hormone replacement therapy (postmenopausal): Secondary | ICD-10-CM

## 2019-12-08 DIAGNOSIS — Z9641 Presence of insulin pump (external) (internal): Secondary | ICD-10-CM | POA: Diagnosis present

## 2019-12-08 DIAGNOSIS — G8929 Other chronic pain: Secondary | ICD-10-CM | POA: Diagnosis present

## 2019-12-08 DIAGNOSIS — K219 Gastro-esophageal reflux disease without esophagitis: Secondary | ICD-10-CM | POA: Diagnosis present

## 2019-12-08 DIAGNOSIS — K921 Melena: Secondary | ICD-10-CM | POA: Diagnosis not present

## 2019-12-08 DIAGNOSIS — M542 Cervicalgia: Secondary | ICD-10-CM | POA: Diagnosis present

## 2019-12-08 DIAGNOSIS — Z791 Long term (current) use of non-steroidal anti-inflammatories (NSAID): Secondary | ICD-10-CM | POA: Diagnosis not present

## 2019-12-08 DIAGNOSIS — E7800 Pure hypercholesterolemia, unspecified: Secondary | ICD-10-CM | POA: Diagnosis present

## 2019-12-08 DIAGNOSIS — M3500 Sicca syndrome, unspecified: Secondary | ICD-10-CM | POA: Diagnosis present

## 2019-12-08 DIAGNOSIS — I10 Essential (primary) hypertension: Secondary | ICD-10-CM | POA: Diagnosis present

## 2019-12-08 DIAGNOSIS — R1084 Generalized abdominal pain: Secondary | ICD-10-CM

## 2019-12-08 DIAGNOSIS — K529 Noninfective gastroenteritis and colitis, unspecified: Secondary | ICD-10-CM

## 2019-12-08 DIAGNOSIS — E872 Acidosis: Secondary | ICD-10-CM | POA: Diagnosis present

## 2019-12-08 DIAGNOSIS — E860 Dehydration: Secondary | ICD-10-CM | POA: Diagnosis present

## 2019-12-08 DIAGNOSIS — Z20822 Contact with and (suspected) exposure to covid-19: Secondary | ICD-10-CM | POA: Diagnosis present

## 2019-12-08 DIAGNOSIS — Z882 Allergy status to sulfonamides status: Secondary | ICD-10-CM | POA: Diagnosis not present

## 2019-12-08 DIAGNOSIS — Z880 Allergy status to penicillin: Secondary | ICD-10-CM | POA: Diagnosis not present

## 2019-12-08 DIAGNOSIS — Z881 Allergy status to other antibiotic agents status: Secondary | ICD-10-CM | POA: Diagnosis not present

## 2019-12-08 DIAGNOSIS — Z885 Allergy status to narcotic agent status: Secondary | ICD-10-CM | POA: Diagnosis not present

## 2019-12-08 DIAGNOSIS — A09 Infectious gastroenteritis and colitis, unspecified: Secondary | ICD-10-CM | POA: Diagnosis present

## 2019-12-08 DIAGNOSIS — R10814 Left lower quadrant abdominal tenderness: Secondary | ICD-10-CM | POA: Diagnosis not present

## 2019-12-08 DIAGNOSIS — H548 Legal blindness, as defined in USA: Secondary | ICD-10-CM | POA: Diagnosis present

## 2019-12-08 LAB — POCT I-STAT, CHEM 8
BUN: 22 mg/dL — ABNORMAL HIGH (ref 6–20)
Calcium, Ion: 1.14 mmol/L — ABNORMAL LOW (ref 1.15–1.40)
Chloride: 102 mmol/L (ref 98–111)
Creatinine, Ser: 0.6 mg/dL (ref 0.44–1.00)
Glucose, Bld: 198 mg/dL — ABNORMAL HIGH (ref 70–99)
HCT: 41 % (ref 36.0–46.0)
Hemoglobin: 13.9 g/dL (ref 12.0–15.0)
Potassium: 7.1 mmol/L (ref 3.5–5.1)
Sodium: 134 mmol/L — ABNORMAL LOW (ref 135–145)
TCO2: 29 mmol/L (ref 22–32)

## 2019-12-08 LAB — BASIC METABOLIC PANEL WITH GFR
Anion gap: 5 (ref 5–15)
Anion gap: 6 (ref 5–15)
BUN: 11 mg/dL (ref 6–20)
BUN: 12 mg/dL (ref 6–20)
CO2: 26 mmol/L (ref 22–32)
CO2: 28 mmol/L (ref 22–32)
Calcium: 8.1 mg/dL — ABNORMAL LOW (ref 8.9–10.3)
Calcium: 8.2 mg/dL — ABNORMAL LOW (ref 8.9–10.3)
Chloride: 104 mmol/L (ref 98–111)
Chloride: 105 mmol/L (ref 98–111)
Creatinine, Ser: 0.92 mg/dL (ref 0.44–1.00)
Creatinine, Ser: 0.95 mg/dL (ref 0.44–1.00)
GFR calc Af Amer: >60 mL/min
GFR calc Af Amer: >60 mL/min
GFR calc non Af Amer: >60 mL/min
GFR calc non Af Amer: >60 mL/min
Glucose, Bld: 101 mg/dL — ABNORMAL HIGH (ref 70–99)
Glucose, Bld: 142 mg/dL — ABNORMAL HIGH (ref 70–99)
Potassium: 4.1 mmol/L (ref 3.5–5.1)
Potassium: 4.2 mmol/L (ref 3.5–5.1)
Sodium: 136 mmol/L (ref 135–145)
Sodium: 138 mmol/L (ref 135–145)

## 2019-12-08 LAB — URINE CULTURE: Culture: 40000 — AB

## 2019-12-08 LAB — CBC
HCT: 36.1 % (ref 36.0–46.0)
HCT: 36.9 % (ref 36.0–46.0)
Hemoglobin: 12.3 g/dL (ref 12.0–15.0)
Hemoglobin: 12.3 g/dL (ref 12.0–15.0)
MCH: 31.1 pg (ref 26.0–34.0)
MCH: 31.2 pg (ref 26.0–34.0)
MCHC: 33.3 g/dL (ref 30.0–36.0)
MCHC: 34.1 g/dL (ref 30.0–36.0)
MCV: 91.6 fL (ref 80.0–100.0)
MCV: 93.4 fL (ref 80.0–100.0)
Platelets: 218 10*3/uL (ref 150–400)
Platelets: 235 10*3/uL (ref 150–400)
RBC: 3.94 MIL/uL (ref 3.87–5.11)
RBC: 3.95 MIL/uL (ref 3.87–5.11)
RDW: 13.8 % (ref 11.5–15.5)
RDW: 13.8 % (ref 11.5–15.5)
WBC: 14.1 10*3/uL — ABNORMAL HIGH (ref 4.0–10.5)
WBC: 15.8 10*3/uL — ABNORMAL HIGH (ref 4.0–10.5)
nRBC: 0 % (ref 0.0–0.2)
nRBC: 0 % (ref 0.0–0.2)

## 2019-12-08 LAB — GLUCOSE, CAPILLARY
Glucose-Capillary: 154 mg/dL — ABNORMAL HIGH (ref 70–99)
Glucose-Capillary: 95 mg/dL (ref 70–99)
Glucose-Capillary: 155 mg/dL — ABNORMAL HIGH (ref 70–99)
Glucose-Capillary: 152 mg/dL — ABNORMAL HIGH (ref 70–99)
Glucose-Capillary: 120 mg/dL — ABNORMAL HIGH (ref 70–99)

## 2019-12-08 LAB — LACTIC ACID, PLASMA: Lactic Acid, Venous: 1.7 mmol/L (ref 0.5–1.9)

## 2019-12-08 MED ORDER — HYDROMORPHONE HCL 1 MG/ML IJ SOLN
1.0000 mg | Freq: Once | INTRAMUSCULAR | Status: AC
  Administered 2019-12-08: 1 mg via INTRAVENOUS
  Filled 2019-12-08: qty 1

## 2019-12-08 MED ORDER — DICYCLOMINE HCL 10 MG PO CAPS
10.0000 mg | ORAL_CAPSULE | Freq: Three times a day (TID) | ORAL | Status: DC
  Administered 2019-12-08 – 2019-12-09 (×4): 10 mg via ORAL
  Filled 2019-12-08 (×4): qty 1

## 2019-12-08 MED ORDER — SIMETHICONE 80 MG PO CHEW
80.0000 mg | CHEWABLE_TABLET | Freq: Four times a day (QID) | ORAL | Status: DC | PRN
  Administered 2019-12-08 – 2019-12-09 (×3): 80 mg via ORAL
  Filled 2019-12-08 (×3): qty 1

## 2019-12-08 MED ORDER — LACTATED RINGERS IV BOLUS
1000.0000 mL | Freq: Once | INTRAVENOUS | Status: AC
  Administered 2019-12-08: 10:00:00 1000 mL via INTRAVENOUS

## 2019-12-08 NOTE — Progress Notes (Signed)
   Subjective: Mary Erickson is feeling better this morning. She notes beginning this morning any time she uses the bathroom it is now red. Continues to have lower abdominal pain, with increased pain prior to having a BM. Had 3 stools overnight. Nausea has resolved. Tolerated liquid diet last night. Orthostatics were still positive this morning.   Objective:  Vital signs in last 24 hours: Vitals:   12/08/19 0545 12/08/19 0547 12/08/19 0548 12/08/19 0549  BP: (!) 104/58     Pulse: 88     Resp: 18     Temp: 98.6 F (37 C)     TempSrc: Oral     SpO2: 95% 96% 97% 98%  Weight:      Height:       Physical Exam Vitals and nursing note reviewed. Exam conducted with a chaperone present.  Constitutional:      General: She is not in acute distress.    Appearance: She is not ill-appearing.  Pulmonary:     Effort: Pulmonary effort is normal.  Abdominal:     General: Abdomen is flat. Bowel sounds are normal.     Palpations: Abdomen is soft.     Tenderness: There is abdominal tenderness (generalized).  Genitourinary:    Comments: External anal exam normal and without evidence of bleeding. Neurological:     Mental Status: She is alert.    Orthostatic VS for the past 24 hrs (Last 3 readings):  BP- Lying Pulse- Lying BP- Sitting Pulse- Sitting BP- Standing at 0 minutes Pulse- Standing at 0 minutes BP- Standing at 3 minutes Pulse- Standing at 3 minutes  12/08/19 0545 104/58 88 95/61 93 (!) 87/54 104 (!) 67/45 98   Assessment/Plan:  Active Problems:   Colitis, acute  Ms. Soley is a 54 year old F with significant PMH of type I diabetes, Hashimoto's, Sjogren's syndrome, and HTN, who presented with severe acute onset diarrhea with CT imaging remarkable for acute colitis.  Acute diarrheal illness Colitis picture continues to appear infectious. BM frequency is improving though her stools are now bloody. Hgb stable this morning at 12.3. Electrolyte derangements resolved after fluids, bicarb  28 << 17 and lactate 1.7 << 3.8. Leukocytosis improved this morning 14.1 << 15.8. Pt received approximately 10L in fluids yesterday with 6.6L documented stool output. Suspect we are still behind in pt's fluid status given her extensive diarrhea before presentation to the hospital. She is still dry on exam and very orthostatic. Will continue supportive care with fluid and anti-emetics.  - will bolus with LR and then continue maintenance rate @ 122mL/hr - administer azithromycin 1,000mg  PO once - ondansetron available q6h PRN - GI pathogen panel pending - continue to monitor BMP while on fluids - continue to trial clear diet - will likely wait to repeat orthostatic this afternoon/tomorrow after further fluid resuscitation    Type I diabetes Per last endocrinology visit in 08/2019, had very well controlled diabetes with A1c's in the 6's.  - continue to use pt's home insulin pump  Hashimoto's - continue levothyroxine 82mcg daily  Sjogren's syndrome  - continue cyclosporine eye drops   Diet - clears Fluids - 155mL/hr LR DVT ppx - enoxaparin 40mg  subQ daily CODE STATUS - FULL CODE   Prior to Admission Living Arrangement: home Anticipated Discharge Location: home Dispo: Anticipated discharge in approximately 1-2 day(s), pending clinical improvement.   Ladona Horns, MD 12/08/2019, 7:20 AM Pager: [phone]

## 2019-12-08 NOTE — Progress Notes (Addendum)
Completed 1L LR bolus, BP 97/54.  Lactic Acid=1.7.

## 2019-12-09 DIAGNOSIS — R10814 Left lower quadrant abdominal tenderness: Secondary | ICD-10-CM

## 2019-12-09 DIAGNOSIS — Z888 Allergy status to other drugs, medicaments and biological substances status: Secondary | ICD-10-CM

## 2019-12-09 DIAGNOSIS — Z886 Allergy status to analgesic agent status: Secondary | ICD-10-CM

## 2019-12-09 DIAGNOSIS — Z881 Allergy status to other antibiotic agents status: Secondary | ICD-10-CM

## 2019-12-09 DIAGNOSIS — Z885 Allergy status to narcotic agent status: Secondary | ICD-10-CM

## 2019-12-09 LAB — BASIC METABOLIC PANEL
Anion gap: 5 (ref 5–15)
BUN: 7 mg/dL (ref 6–20)
CO2: 26 mmol/L (ref 22–32)
Calcium: 7.9 mg/dL — ABNORMAL LOW (ref 8.9–10.3)
Chloride: 103 mmol/L (ref 98–111)
Creatinine, Ser: 0.79 mg/dL (ref 0.44–1.00)
GFR calc Af Amer: >60 mL/min (ref >60)
GFR calc non Af Amer: >60 mL/min (ref >60)
Glucose, Bld: 93 mg/dL (ref 70–99)
Potassium: 4.1 mmol/L (ref 3.5–5.1)
Sodium: 134 mmol/L — ABNORMAL LOW (ref 135–145)

## 2019-12-09 LAB — CBC
HCT: 33.5 % — ABNORMAL LOW (ref 36.0–46.0)
Hemoglobin: 11.2 g/dL — ABNORMAL LOW (ref 12.0–15.0)
MCH: 31.3 pg (ref 26.0–34.0)
MCHC: 33.4 g/dL (ref 30.0–36.0)
MCV: 93.6 fL (ref 80.0–100.0)
Platelets: 191 10*3/uL (ref 150–400)
RBC: 3.58 MIL/uL — ABNORMAL LOW (ref 3.87–5.11)
RDW: 13.8 % (ref 11.5–15.5)
WBC: 17.3 10*3/uL — ABNORMAL HIGH (ref 4.0–10.5)
nRBC: 0 % (ref 0.0–0.2)

## 2019-12-09 LAB — GLUCOSE, CAPILLARY
Glucose-Capillary: 97 mg/dL (ref 70–99)
Glucose-Capillary: 146 mg/dL — ABNORMAL HIGH (ref 70–99)
Glucose-Capillary: 142 mg/dL — ABNORMAL HIGH (ref 70–99)

## 2019-12-09 MED ORDER — SIMETHICONE 80 MG PO CHEW: 80.0000 mg | CHEWABLE_TABLET | Freq: Four times a day (QID) | ORAL | 0 refills | Status: DC | PRN

## 2019-12-09 MED ORDER — BISMUTH SUBSALICYLATE 262 MG PO CHEW
2.0000 | CHEWABLE_TABLET | Freq: Three times a day (TID) | ORAL | Status: DC
  Administered 2019-12-09: 524 mg via ORAL
  Filled 2019-12-09 (×3): qty 2

## 2019-12-09 MED ORDER — DICYCLOMINE HCL 10 MG PO CAPS: 10.0000 mg | ORAL_CAPSULE | Freq: Three times a day (TID) | ORAL | 0 refills | Status: DC

## 2019-12-09 MED FILL — DICYCLOMINE HCL 10 MG CAPS: 10 | 5 days supply | Qty: 20 | Fill #0

## 2019-12-09 MED FILL — MI-ACID GAS 80 MG TAB CHEW: 80 | 5 days supply | Qty: 20 | Fill #0

## 2019-12-09 NOTE — Progress Notes (Signed)
   Subjective: Ms. Cambria is concerned about her abdominal pain and finds it difficult to get comfortable. Had 3 stools yesterday. Doing well with keeping up with liquids. Orthostatics were better this morning. Discussed plan to advance diet and walk today, pt very agreeable.   Objective:  Vital signs in last 24 hours: Vitals:   12/08/19 0549 12/08/19 1614 12/08/19 2050 12/09/19 0337  BP:  (!) 107/56 112/67 118/72  Pulse:  96 95 97  Resp:  16 18 18   Temp:  99.8 F (37.7 C) 98.8 F (37.1 C) 98.5 F (36.9 C)  TempSrc:  Oral Oral Oral  SpO2: 98% 96% 96% 99%  Weight:      Height:       Physical Exam Vitals and nursing note reviewed.  Constitutional:      General: She is not in acute distress.    Appearance: She is not ill-appearing.     Comments: Pt up and standing at the side of the bed  Pulmonary:     Effort: Pulmonary effort is normal.  Abdominal:     General: Abdomen is flat.     Palpations: Abdomen is soft.     Tenderness: There is abdominal tenderness (LLQ).  Skin:    General: Skin is warm and dry.  Neurological:     Mental Status: She is alert.    Assessment/Plan:  Principal Problem:   Acute colitis Active Problems:   Type 1 diabetes mellitus (HCC)   Orthostatic hypotension   Ms. Sidman is a 54 year old F with significant PMH of type I diabetes, Hashimoto's, Sjogren's syndrome, and HTN, who presentedwith severe acute onset diarrhea with CT imaging remarkable for acute colitis.  Acute diarrheal illness Colitis picture continues to appear infectious. BM frequency stable with 3 yesterday, bloody/mucus in character. Hgb stable at 11. Electrolytes stable. Pt tolerating PO intake. Appears clinically much improved today, euvolemic, and able to stand at the side of bed. - orthostatic improved today, no longer positive  - encouraged ambulation - pt off IV fluids - advance diet to regular - GI pathogen panel still pending - continue bentyl and PRN simethicone  for gas cramps/pain - add on Pepto - work to avoid narcotic for pain control to avoid constipation - ondansetron available q6h PRN  Type I diabetes Per last endocrinology visit in 08/2019, had very well controlled diabetes with A1c's in the 6's.  - AM glc this morning 146 - sugars remain well controlled - pt to continue to use home insulin pump  Diet - regular Fluids - none DVT ppx - enoxaparin 40mg  subQ daily CODE STATUS - FULL CODE  Dispo: Anticipated discharge in approximately 0-1 day(s).   Ladona Horns, MD 12/09/2019, 6:27 AM Pager: [phone]

## 2019-12-09 NOTE — Discharge Summary (Signed)
Name: Mary Erickson MRN: [id number] DOB: 1966-08-07 54 y.o. PCP: Mary Girt, DO  Date of Admission: 12/07/2019  3:24 AM Date of Discharge: 12/09/2019 Attending Physician: Axel Filler, *  Discharge Diagnosis: Principal Problem:   Acute colitis Active Problems:   Type 1 diabetes mellitus ([place])   Orthostatic hypotension  Discharge Medications: Allergies as of 12/09/2019      Reactions   Doxycycline Other (See Comments)   Elevates liver enzymes   Aspirin Other (See Comments)   Can take 81 mg but has to be coated; No other Aspirin due to bleeding in stools from ulcers   Ciprofloxacin Hives   Per pt, she can tolerate oral but not IV   Nsaids Other (See Comments)   Causes kidney and skin problems Kidney and skin problems   Amoxicillin-pot Clavulanate Nausea Only   Codeine Nausea And Vomiting   Hydrocodone-acetaminophen Nausea And Vomiting   Oxycodone Nausea And Vomiting   Sulfa Antibiotics Nausea And Vomiting   Sulfasalazine Nausea And Vomiting      Medication List    TAKE these medications   B-12 2500 MCG Tabs Take 2,500 mcg by mouth daily.   CLEAR EYES OP Place 1 drop into both eyes daily.   cycloSPORINE 0.05 % ophthalmic emulsion Commonly known as: RESTASIS Place 1 drop into both eyes 2 (two) times daily.   D3-1000 25 MCG (1000 UT) capsule Generic drug: Cholecalciferol Take 1,000 Units by mouth daily.   dicyclomine 10 MG capsule Commonly known as: BENTYL Take 1 capsule (10 mg total) by mouth 4 (four) times daily -  before meals and at bedtime for 5 days.   Fish Oil 1200 MG Caps Take 1,200 mg by mouth 2 (two) times daily.   ibuprofen 200 MG tablet Commonly known as: ADVIL Take 200-400 mg by mouth every 6 (six) hours as needed for mild pain.   insulin aspart 100 UNIT/ML injection Commonly known as: novoLOG Inject 50-60 Units into the skin See admin instructions. Insulin pump basil rate 50-60 units throughout day. Additional 0-5 units  per bolos.   insulin pump Soln Inject into the skin.   Levemir FlexTouch 100 UNIT/ML Pen Generic drug: Insulin Detemir Inject 7 Units into the skin 2 (two) times daily as needed (in case pump breaks).   Levothyroxine Sodium 50 MCG Caps Take 50 mcg by mouth daily.   Magnesium 400 MG Caps Take 400 mg by mouth daily.   multivitamins ther. w/minerals Tabs tablet Take 1 tablet by mouth daily.   naproxen sodium 220 MG tablet Commonly known as: ALEVE Take 220 mg by mouth as needed (pain).   omeprazole 40 MG capsule Commonly known as: PRILOSEC Take 40 mg by mouth daily.   ondansetron 4 MG disintegrating tablet Commonly known as: Zofran ODT Take 1 tablet (4 mg total) by mouth every 8 (eight) hours as needed for nausea or vomiting.   predniSONE 20 MG tablet Commonly known as: DELTASONE Take 2 tablets (40 mg total) by mouth daily with breakfast. For the next four days   prochlorperazine 10 MG tablet Commonly known as: COMPAZINE Take 1 tablet (10 mg total) by mouth 2 (two) times daily as needed for nausea or vomiting.   promethazine 25 MG tablet Commonly known as: PHENERGAN Take 1 tablet (25 mg total) by mouth every 6 (six) hours as needed for nausea or vomiting.   simethicone 80 MG chewable tablet Commonly known as: MYLICON Chew 1 tablet (80 mg total) by mouth 4 (four) times  daily as needed for up to 5 days for flatulence (abdominal gas pain).   traMADol 50 MG tablet Commonly known as: ULTRAM Take 1-2 tablets (50-100 mg total) by mouth every 6 (six) hours as needed for moderate pain or severe pain.   vitamin C 1000 MG tablet Take 1,000 mg by mouth daily.   vitamin E 180 MG (400 UNITS) capsule Generic drug: vitamin E Take 400 Units by mouth daily.   Wh Petrol-Mineral Oil-Lanolin 0.1-0.1 % Oint Apply 1 application to eye every evening.   Zinc 50 MG Tabs Take 50 mg by mouth daily.       Disposition and follow-up:   Mary Erickson was discharged from [facility] in Good condition.  At the hospital follow up visit please address:  1.  Acute diarrheal illness - treated with IV fluids and supportive care - C diff negative, and stool studies pending at time of d/c - pt to follow-up with outpatient GI doctor  2.  Labs / imaging needed at time of follow-up: NONE  3.  Pending labs/ test needing follow-up: GI pathogen panel  Follow-up Appointments: Follow-up Information    Tarri Glenn, [place]. Schedule an appointment as soon as possible for a visit in 3 days.   Specialty: Gastroenterology Contact information: [id number] [place] [address] [phone]        [employer]   Specialty: Emergency Medicine Why: If symptoms worsen Contact information: [address] [id number] [place] Hospital Course by problem list: 1. [name] is a 54 year old F with significant PMH of type I diabetes, Hashimoto's, Sjogren's syndrome, and HTN, who presented with severe acute onset diarrhea with CT imaging remarkable for acute colitis. History and sudden onset presentation most consistent with infectious colitis as opposed to IBD. No recent abx use, no history of consuming uncooked/questionable foods, and C diff testing was negative. On hospital day 1, her stool output turned from loose/watery brown to mucus/red. Her Hgb held at greater than 11. Pt was documented as having 6.7L of stool output, and was given aggressive IV fluid resuscitation of over 10L. She was given a 1 time dose to azithromycin 1,000mg  PO. On hospital day 2, pt's blood pressures had improved and was no longer orthostatic. She was tolerating a diet and able to ambulate in the hallways. Pt was discharged with bentyl and gas-x for persistent abdominal pain/cramping and instructed to follow-up with her outpatient GI doctor.   Discharge Vitals:    BP 118/72 (BP Location: Right Arm)   Pulse 97   Temp 98.6 F (37 C) (Oral)   Resp 18   Ht 5\' 8"  (1.727 m)   Wt 67 kg   SpO2 99%   BMI 22.46 kg/m   Pertinent Labs, Studies, and Procedures:  CBC Latest Ref Rng & Units 12/09/2019 12/08/2019 12/08/2019  WBC 4.0 - 10.5 K/uL 17.3(H) 14.1(H) 15.8(H)  Hemoglobin 12.0 - 15.0 g/dL 11.2(L) 12.3 12.3  Hematocrit 36.0 - 46.0 % 33.5(L) 36.9 36.1  Platelets 150 - 400 K/uL 191 218 235   BMP Latest Ref Rng & Units 12/09/2019 12/08/2019 12/08/2019  Glucose 70 - 99 mg/dL 93 101(H) 142(H)  BUN 6 - 20 mg/dL 7 11 12   Creatinine 0.44 - 1.00 mg/dL 0.79 0.92 0.95  Sodium 135 - 145 mmol/L 134(L) 138 136  Potassium 3.5 - 5.1 mmol/L 4.1  4.2 4.1  Chloride 98 - 111 mmol/L 103 104 105  CO2 22 - 32 mmol/L 26 28 26   Calcium 8.9 - 10.3 mg/dL 7.9(L) 8.2(L) 8.1(L)   Iron/TIBC/Ferritin/ %Sat    Component Value Date/Time   IRON 20 (L) 12/07/2019 0412   TIBC NOT CALCULATED 12/07/2019 0412   FERRITIN 6 (L) 12/07/2019 0412   IRONPCTSAT NOT CALCULATED 12/07/2019 0412   Lab Results  Component Value Date   VITAMINB12 >7,500 (H) 12/07/2019   Urinalysis    Component Value Date/Time   COLORURINE YELLOW 12/07/2019 0935   APPEARANCEUR CLEAR 12/07/2019 0935   LABSPEC 1.043 (H) 12/07/2019 0935   PHURINE 6.0 12/07/2019 0935   GLUCOSEU >=500 (A) 12/07/2019 0935   HGBUR NEGATIVE 12/07/2019 0935   BILIRUBINUR NEGATIVE 12/07/2019 0935   KETONESUR 20 (A) 12/07/2019 0935   PROTEINUR NEGATIVE 12/07/2019 0935   UROBILINOGEN 0.2 11/25/2012 0045   NITRITE NEGATIVE 12/07/2019 0935   LEUKOCYTESUR MODERATE (A) 12/07/2019 0935   Recent Results (from the past 240 hour(s))  C difficile quick scan w PCR reflex     Status: None   Collection Time: 12/07/19  6:45 AM   Specimen: STOOL  Result Value Ref Range Status   C Diff antigen NEGATIVE NEGATIVE Final   C Diff toxin NEGATIVE NEGATIVE Final   C Diff interpretation No C. difficile detected.  Final    Comment: Performed at  [facility], [place] [address], [place], [place] [address]  Urine culture     Status: Abnormal   Collection Time: 12/07/19  9:35 AM   Specimen: Urine, Random  Result Value Ref Range Status   Specimen Description URINE, RANDOM  Final   Special Requests   Final    NONE Performed at [facility], [place] [address] [place], [place] [address]    Culture 40,000 COLONIES/mL VIRIDANS STREPTOCOCCUS (A)  Final   Report Status 12/08/2019 FINAL  Final  SARS CORONAVIRUS 2 (TAT 6-24 HRS) Nasopharyngeal Nasopharyngeal Swab     Status: None   Collection Time: 12/07/19 12:31 PM   Specimen: Nasopharyngeal Swab  Result Value Ref Range Status   SARS Coronavirus 2 NEGATIVE NEGATIVE Final    Comment: (NOTE) SARS-CoV-2 target nucleic acids are NOT DETECTED. The SARS-CoV-2 RNA is generally detectable in upper and lower respiratory specimens during the acute phase of infection. Negative results do not preclude SARS-CoV-2 infection, do not rule out co-infections with other pathogens, and should not be used as the sole basis for treatment or other patient management decisions. Negative results must be combined with clinical observations, patient history, and epidemiological information. The expected result is Negative. Fact Sheet for Patients: SugarRoll.be Fact Sheet for Healthcare Providers: https://www.woods-mathews.com/ This test is not yet approved or cleared by the [place] FDA and  has been authorized for detection and/or diagnosis of SARS-CoV-2 by FDA under an Emergency Use Authorization (EUA). This EUA will remain  in effect (meaning this test can be used) for the duration of the COVID-19 declaration under Section 56 4(b)(1) of the Act, 21 U.S.C. section 360bbb-3(b)(1), unless the authorization is terminated or revoked sooner. Performed at [facility], [place] [address], [place], [place] [address]   Culture, blood (routine x 2)      Status: None (Preliminary result)   Collection Time: 12/07/19  4:50 PM   Specimen: BLOOD  Result Value Ref Range Status   Specimen Description BLOOD RIGHT ANTECUBITAL  Final   Special Requests   Final  BOTTLES DRAWN AEROBIC ONLY Blood Culture adequate volume   Culture   Final    NO GROWTH 2 DAYS Performed at [facility], [place] [address] [place], [place] [address]    Report Status PENDING  Incomplete  Culture, blood (routine x 2)     Status: None (Preliminary result)   Collection Time: 12/07/19  4:53 PM   Specimen: BLOOD  Result Value Ref Range Status   Specimen Description BLOOD RIGHT ANTECUBITAL  Final   Special Requests   Final    BOTTLES DRAWN AEROBIC ONLY Blood Culture adequate volume   Culture   Final    NO GROWTH 2 DAYS Performed at [facility], [place] [address] [place], [place] [address]    Report Status PENDING  Incomplete   CT ABDOMEN 12/07/2019 CLINICAL DATA:  Lower abdominal pain for 9 hours. Nausea and vomiting. Diarrhea.  EXAM: CT ABDOMEN AND PELVIS WITH CONTRAST  TECHNIQUE: Multidetector CT imaging of the abdomen and pelvis was performed using the standard protocol following bolus administration of intravenous contrast.  CONTRAST:  160mL OMNIPAQUE IOHEXOL 300 MG/ML  SOLN  COMPARISON:  02/03/2013 from [facility]  FINDINGS: Lower Chest: No acute findings.  Hepatobiliary: 3.7 cm low-attenuation lesion in the posterior right hepatic lobe remains stable since prior study, consistent with benign etiology. Focal fatty infiltration also seen adjacent to the falciform ligament. No new or enlarging liver lesions identified. Prior cholecystectomy. No evidence of biliary obstruction.  Pancreas:  No mass or inflammatory changes.  Spleen: Within normal limits in size and appearance.  Adrenals/Urinary Tract: No masses identified. Sub-cm right renal cyst noted. No evidence of ureteral calculi or hydronephrosis. Urinary bladder  is mildly distended but otherwise unremarkable in appearance.  Stomach/Bowel: No evidence of bowel obstruction. Moderate wall thickening is seen involving the descending and sigmoid colon, consistent with colitis. No evidence of abscess or free intraperitoneal air. Mild diffuse mesenteric edema is noted.  Vascular/Lymphatic: No pathologically enlarged lymph nodes. No abdominal aortic aneurysm. Aortic atherosclerosis incidentally noted.  Reproductive: Multiple pelvic surgical clips noted. No mass or other significant abnormality.  Other:  None.  Musculoskeletal:  No suspicious bone lesions identified.  IMPRESSION: Moderate colitis involving the descending and sigmoid colon. No evidence of abscess or other complication.  Discharge Instructions: Discharge Instructions    Call MD for:  difficulty breathing, headache or visual disturbances   Complete by: As directed    Call MD for:  extreme fatigue   Complete by: As directed    Call MD for:  hives   Complete by: As directed    Call MD for:  persistant dizziness or light-headedness   Complete by: As directed    Call MD for:  persistant nausea and vomiting   Complete by: As directed    Call MD for:  redness, tenderness, or signs of infection (pain, swelling, redness, odor or green/yellow discharge around incision site)   Complete by: As directed    Call MD for:  severe uncontrolled pain   Complete by: As directed    Call MD for:  temperature >100.4   Complete by: As directed    Diet - low sodium heart healthy   Complete by: As directed    Discharge instructions   Complete by: As directed    Ms. Cerasoli,  You were admitted to the hospital due to dehydration from an acute diarrheal illness. You were treated with supportive care, anti-nausea medicines, and IV fluids. It is likely this illness  and inflammation was caused by an infection, though a specific bacteria was not identified on the stool samples.  Please  follow-up with your primary GI doctor for hospital follow-up and continued monitoring as they may recommend a colonoscopy in the future.  Thank you for letting us be part of your care!   Increase activity slowly   Complete by: As directed       Signed: Ladona Horns, MD 12/09/2019, 2:21 PM   Pager: [phone]

## 2019-12-10 ENCOUNTER — Emergency Department (HOSPITAL_COMMUNITY): Payer: Medicaid Other

## 2019-12-10 ENCOUNTER — Encounter (HOSPITAL_COMMUNITY): Payer: Self-pay

## 2019-12-10 ENCOUNTER — Inpatient Hospital Stay (HOSPITAL_COMMUNITY)
Admission: EM | Admit: 2019-12-10 | Discharge: 2019-12-11 | DRG: 392 | Discharge status: Against Medical Advice | Payer: Medicaid Other | Attending: Student in an Organized Health Care Education/Training Program | Admitting: Student in an Organized Health Care Education/Training Program

## 2019-12-10 DIAGNOSIS — R42 Dizziness and giddiness: Secondary | ICD-10-CM

## 2019-12-10 DIAGNOSIS — Z794 Long term (current) use of insulin: Secondary | ICD-10-CM

## 2019-12-10 DIAGNOSIS — H548 Legal blindness, as defined in USA: Secondary | ICD-10-CM | POA: Diagnosis present

## 2019-12-10 DIAGNOSIS — Z880 Allergy status to penicillin: Secondary | ICD-10-CM

## 2019-12-10 DIAGNOSIS — Z8619 Personal history of other infectious and parasitic diseases: Secondary | ICD-10-CM

## 2019-12-10 DIAGNOSIS — R933 Abnormal findings on diagnostic imaging of other parts of digestive tract: Secondary | ICD-10-CM

## 2019-12-10 DIAGNOSIS — Z9049 Acquired absence of other specified parts of digestive tract: Secondary | ICD-10-CM

## 2019-12-10 DIAGNOSIS — Z886 Allergy status to analgesic agent status: Secondary | ICD-10-CM

## 2019-12-10 DIAGNOSIS — Z87440 Personal history of urinary (tract) infections: Secondary | ICD-10-CM

## 2019-12-10 DIAGNOSIS — E109 Type 1 diabetes mellitus without complications: Secondary | ICD-10-CM | POA: Diagnosis present

## 2019-12-10 DIAGNOSIS — Z9641 Presence of insulin pump (external) (internal): Secondary | ICD-10-CM | POA: Diagnosis present

## 2019-12-10 DIAGNOSIS — A09 Infectious gastroenteritis and colitis, unspecified: Principal | ICD-10-CM | POA: Diagnosis present

## 2019-12-10 DIAGNOSIS — Z820 Family history of epilepsy and other diseases of the nervous system: Secondary | ICD-10-CM

## 2019-12-10 DIAGNOSIS — Z85820 Personal history of malignant melanoma of skin: Secondary | ICD-10-CM

## 2019-12-10 DIAGNOSIS — Z882 Allergy status to sulfonamides status: Secondary | ICD-10-CM

## 2019-12-10 DIAGNOSIS — Z5329 Procedure and treatment not carried out because of patient's decision for other reasons: Secondary | ICD-10-CM | POA: Diagnosis not present

## 2019-12-10 DIAGNOSIS — Z833 Family history of diabetes mellitus: Secondary | ICD-10-CM

## 2019-12-10 DIAGNOSIS — Z90710 Acquired absence of both cervix and uterus: Secondary | ICD-10-CM

## 2019-12-10 DIAGNOSIS — E7800 Pure hypercholesterolemia, unspecified: Secondary | ICD-10-CM | POA: Diagnosis present

## 2019-12-10 DIAGNOSIS — K219 Gastro-esophageal reflux disease without esophagitis: Secondary | ICD-10-CM | POA: Diagnosis present

## 2019-12-10 DIAGNOSIS — Z87891 Personal history of nicotine dependence: Secondary | ICD-10-CM

## 2019-12-10 DIAGNOSIS — K529 Noninfective gastroenteritis and colitis, unspecified: Secondary | ICD-10-CM | POA: Diagnosis present

## 2019-12-10 DIAGNOSIS — E063 Autoimmune thyroiditis: Secondary | ICD-10-CM | POA: Diagnosis present

## 2019-12-10 DIAGNOSIS — N319 Neuromuscular dysfunction of bladder, unspecified: Secondary | ICD-10-CM | POA: Diagnosis present

## 2019-12-10 DIAGNOSIS — M3500 Sicca syndrome, unspecified: Secondary | ICD-10-CM | POA: Diagnosis present

## 2019-12-10 DIAGNOSIS — Z79899 Other long term (current) drug therapy: Secondary | ICD-10-CM

## 2019-12-10 DIAGNOSIS — Z881 Allergy status to other antibiotic agents status: Secondary | ICD-10-CM

## 2019-12-10 DIAGNOSIS — Z20822 Contact with and (suspected) exposure to covid-19: Secondary | ICD-10-CM | POA: Diagnosis present

## 2019-12-10 DIAGNOSIS — Z885 Allergy status to narcotic agent status: Secondary | ICD-10-CM

## 2019-12-10 LAB — CBC WITH DIFFERENTIAL/PLATELET
Abs Immature Granulocytes: 0.09 10*3/uL — ABNORMAL HIGH (ref 0.00–0.07)
Basophils Absolute: 0.1 10*3/uL (ref 0.0–0.1)
Basophils Relative: 1 %
Eosinophils Absolute: 0.2 10*3/uL (ref 0.0–0.5)
Eosinophils Relative: 1 %
HCT: 34 % — ABNORMAL LOW (ref 36.0–46.0)
Hemoglobin: 11.2 g/dL — ABNORMAL LOW (ref 12.0–15.0)
Immature Granulocytes: 1 %
Lymphocytes Relative: 12 %
Lymphs Abs: 1.8 10*3/uL (ref 0.7–4.0)
MCH: 31.3 pg (ref 26.0–34.0)
MCHC: 32.9 g/dL (ref 30.0–36.0)
MCV: 95 fL (ref 80.0–100.0)
Monocytes Absolute: 1.4 10*3/uL — ABNORMAL HIGH (ref 0.1–1.0)
Monocytes Relative: 10 %
Neutro Abs: 11 10*3/uL — ABNORMAL HIGH (ref 1.7–7.7)
Neutrophils Relative %: 75 %
Platelets: 214 10*3/uL (ref 150–400)
RBC: 3.58 MIL/uL — ABNORMAL LOW (ref 3.87–5.11)
RDW: 13.9 % (ref 11.5–15.5)
WBC: 14.6 10*3/uL — ABNORMAL HIGH (ref 4.0–10.5)
nRBC: 0 % (ref 0.0–0.2)

## 2019-12-10 LAB — URINALYSIS, ROUTINE W REFLEX MICROSCOPIC
Bilirubin Urine: NEGATIVE
Glucose, UA: NEGATIVE mg/dL
Hgb urine dipstick: NEGATIVE
Ketones, ur: NEGATIVE mg/dL
Leukocytes,Ua: NEGATIVE
Nitrite: NEGATIVE
Protein, ur: NEGATIVE mg/dL
Specific Gravity, Urine: 1.005 (ref 1.005–1.030)
pH: 8 (ref 5.0–8.0)

## 2019-12-10 LAB — COMPREHENSIVE METABOLIC PANEL
ALT: 21 U/L (ref 0–44)
AST: 19 U/L (ref 15–41)
Albumin: 2.9 g/dL — ABNORMAL LOW (ref 3.5–5.0)
Alkaline Phosphatase: 76 U/L (ref 38–126)
Anion gap: 11 (ref 5–15)
BUN: 10 mg/dL (ref 6–20)
CO2: 28 mmol/L (ref 22–32)
Calcium: 8.8 mg/dL — ABNORMAL LOW (ref 8.9–10.3)
Chloride: 102 mmol/L (ref 98–111)
Creatinine, Ser: 0.77 mg/dL (ref 0.44–1.00)
GFR calc Af Amer: >60 mL/min (ref >60)
GFR calc non Af Amer: >60 mL/min (ref >60)
Glucose, Bld: 70 mg/dL (ref 70–99)
Potassium: 3.5 mmol/L (ref 3.5–5.1)
Sodium: 141 mmol/L (ref 135–145)
Total Bilirubin: 0.6 mg/dL (ref 0.3–1.2)
Total Protein: 5.9 g/dL — ABNORMAL LOW (ref 6.5–8.1)

## 2019-12-10 LAB — GI PATHOGEN PANEL BY PCR, STOOL

## 2019-12-10 MED ORDER — IOHEXOL 300 MG/ML  SOLN
100.0000 mL | Freq: Once | INTRAMUSCULAR | Status: AC | PRN
  Administered 2019-12-10: 100 mL via INTRAVENOUS

## 2019-12-10 MED ORDER — IOHEXOL 9 MG/ML PO SOLN
ORAL | Status: AC
  Administered 2019-12-10: 500 mL via ORAL
  Filled 2019-12-10: qty 1000

## 2019-12-10 MED ORDER — ACETAMINOPHEN 500 MG PO TABS
1000.0000 mg | ORAL_TABLET | Freq: Once | ORAL | Status: AC
  Administered 2019-12-10: 1000 mg via ORAL
  Filled 2019-12-10: qty 2

## 2019-12-10 MED ORDER — SODIUM CHLORIDE 0.9 % IV BOLUS
1000.0000 mL | Freq: Once | INTRAVENOUS | Status: AC
  Administered 2019-12-10: 1000 mL via INTRAVENOUS

## 2019-12-10 MED ORDER — IOHEXOL 9 MG/ML PO SOLN
500.0000 mL | ORAL | Status: AC
  Administered 2019-12-10: 500 mL via ORAL

## 2019-12-10 NOTE — ED Triage Notes (Signed)
Pt presents with c/o dizziness. Pt reports she was feeling dizzy early this week and was admitted at [facility], reports no definitive diagnosis but she did have diarrhea with blood and mucus as well as vomiting. Pt reports she was discharged but today is still feeling dizzy. Pt is concerned that she is dehydrated.

## 2019-12-10 NOTE — ED Provider Notes (Addendum)
[name] DEPT Provider Note   CSN: [id number] Arrival date & time: 12/10/19  1716     History Chief Complaint  Patient presents with  . Dizziness    Mary [facility]kson is a 54 y.o. female.  HPI Patient presents to the emergency department with dizziness that was occurring today.  The patient states she was admitted to [facility] recently for dehydration and bloody diarrhea with mucus as well as vomiting.  The patient states that she was discharged today but she felt dizzy patient states that nothing seems to make her condition better or worse.  The patient denies chest pain, shortness of breath, headache,blurred vision, neck pain, fever, cough, weakness, numbness, dizziness, anorexia, edema, rash, back pain, dysuria, hematemesis, bloody stool, near syncope, or syncope.    Past Medical History:  Diagnosis Date  . Acid reflux   . Anemia   . Arthritis    "elbows, hands, neck, shoulders" (10/02/2016)  . Calcium blood increased   . Chronic neck pain   . Chronic UTI (urinary tract infection)    "from self caths" (10/02/2016)  . Hepatitis B    acute hepatitis B from lancet  . History of alcoholism ([facility])    10/02/2016 "sober since 03/14/2005"  . History of blood transfusion 1984; 1989; 2008   "quite a few"  . History of shortness of breath   . Hypercholesteremia   . Hypertension    pt. states that she has only had low blood pressure  . Hypothyroidism   . Internal carotid aneurysm dx'd early 2017  . Legally blind in left eye, as defined in [place]   . Melanoma ([facility])    left upper arm  . Migraines    "quite a few in a month; at least 15" (10/02/2016)  . Neuropathy    neck; "not sure if it's from DM or Sjogren's" (10/02/2016)  . Pneumonia 1986   "1 yr after major OR when I was under anesthesia for 13 hr"  . Seizures ([facility]) ~ 2004; ~ 2013   "seizures from diabetes "  . Self-catheterizes urinary bladder    "since OR in 1984" (10/02/2016)  .  Sjogren's syndrome ([facility])   . Thyroid goiter    "still present" (10/02/2016)  . Type II diabetes mellitus ([facility])   . Urinary retention    self caths  . Wears glasses     Patient Active Problem List   Diagnosis Date Noted  . Acute colitis 12/08/2019  . Orthostatic hypotension 12/08/2019  . Type 1 diabetes mellitus ([facility]) 06/30/2019  . Hashimoto's disease 06/30/2019  . Sjogren's syndrome ([place]) 06/30/2019  . Hx of Hypertension 06/30/2019  . Neurogenic bladder 06/30/2019  . Malignant melanoma of left upper arm ([facility]) 10/02/2016    Past Surgical History:  Procedure Laterality Date  . ABDOMINAL HYSTERECTOMY  2008  . ECTOPIC PREGNANCY SURGERY  1989  . EXCISION MELANOMA WITH SENTINEL LYMPH NODE BIOPSY Left 10/02/2016   Procedure: WIDE LOCAL EXCISION AND ADVANCEMENT FLAP CLOSURE LEFT UPPER  MELANOMA WITH SENTINEL LYMPH NODE MAPPING AND BIOPSY;  Surgeon: [name], MD;  Location: [facility];  Service: General;  Laterality: Left;  . EYE SURGERY    . LAPAROSCOPIC CHOLECYSTECTOMY  ~ 01/2016  . MELANOMA EXCISION WITH SENTINEL LYMPH NODE BIOPSY Left 10/02/2016   WIDE LOCAL EXCISION AND ADVANCEMENT FLAP CLOSURE LEFT UPPER  MELANOMA WITH SENTINEL LYMPH NODE MAPPING AND BIOPSY  . SHOULDER ARTHROSCOPY W/ ROTATOR CUFF REPAIR Right 2014   "partial"  . STRABISMUS  SURGERY Left 1971  . TUMOR EXCISION  1984   from tail bone and part of tail bone removed     OB History   No obstetric history on file.     History reviewed. No pertinent family history.  Social History   Tobacco Use  . Smoking status: Former Smoker    Packs/day: 1.50    Years: 24.00    Pack years: 36.00    Types: Cigarettes    Quit date: 2008    Years since quitting: 13.0  . Smokeless tobacco: Never Used  Substance Use Topics  . Alcohol use: No    Comment: 10/02/2016 "sober since 03/14/2005"  . Drug use: Yes    Types: Marijuana    Comment: 10/02/2016 " occasional; nothing since before 2006"    Home Medications Prior to  Admission medications   Medication Sig Start Date End Date Taking? Authorizing Provider  acetaminophen (TYLENOL) 325 MG tablet Take 650 mg by mouth every 6 (six) hours as needed for mild pain.   Yes [provider]  Ascorbic Acid (VITAMIN C) 1000 MG tablet Take 1,000 mg by mouth daily.   Yes [provider]  Cholecalciferol (D3-1000) 25 MCG (1000 UT) capsule Take 1,000 Units by mouth daily.    Yes [provider]  Cyanocobalamin (B-12) 2500 MCG TABS Take 2,500 mcg by mouth daily.    Yes [provider]  cycloSPORINE (RESTASIS) 0.05 % ophthalmic emulsion Place 1 drop into both eyes 2 (two) times daily.   Yes [provider]  dicyclomine (BENTYL) 10 MG capsule Take 1 capsule (10 mg total) by mouth 4 (four) times daily -  before meals and at bedtime for 5 days. 12/09/19 12/14/19 Yes Ladona Horns, MD  ibuprofen (ADVIL) 200 MG tablet Take 200-400 mg by mouth every 6 (six) hours as needed for mild pain.   Yes [provider]  insulin aspart (NOVOLOG) 100 UNIT/ML injection Inject 50-60 Units into the skin See admin instructions. Insulin pump basil rate 50-60 units throughout day. Additional 0-5 units per bolos.   Yes [provider]  Insulin Detemir (LEVEMIR FLEXTOUCH) 100 UNIT/ML Pen Inject 4 Units into the skin 2 (two) times daily as needed (in case pump breaks).  09/27/17  Yes [provider]  Insulin Human (INSULIN PUMP) SOLN Inject into the skin.   Yes [provider]  Levothyroxine Sodium 50 MCG CAPS Take 50 mcg by mouth daily. 11/10/19  Yes [provider]  Magnesium 400 MG CAPS Take 400 mg by mouth daily.   Yes [provider]  Multiple Vitamins-Minerals (MULTIVITAMINS THER. W/MINERALS) TABS Take 1 tablet by mouth daily.   Yes [provider]  Naphazoline HCl (CLEAR EYES OP) Place 1 drop into both eyes daily.   Yes [provider]  naproxen sodium (ALEVE) 220 MG tablet Take 220 mg by  mouth as needed (pain).   Yes [provider]  Omega-3 Fatty Acids (FISH OIL) 1200 MG CAPS Take 1,200 mg by mouth 2 (two) times daily.    Yes [provider]  omeprazole (PRILOSEC) 40 MG capsule Take 40 mg by mouth daily. 05/04/19  Yes [provider]  ondansetron (ZOFRAN ODT) 4 MG disintegrating tablet Take 1 tablet (4 mg total) by mouth every 8 (eight) hours as needed for nausea or vomiting. 12/07/19  Yes Sherwood Gambler, MD  prochlorperazine (COMPAZINE) 10 MG tablet Take 1 tablet (10 mg total) by mouth 2 (two) times daily as needed for nausea or vomiting.  10/10/19  Yes Jacqlyn Larsen, PA-C  simethicone (MYLICON) 80 MG chewable tablet Chew 1 tablet (80 mg total) by mouth 4 (four) times daily as needed for up to 5 days for flatulence (abdominal gas pain). 12/09/19 12/14/19 Yes Ladona Horns, MD  vitamin E (VITAMIN E) 400 UNIT capsule Take 400 Units by mouth daily.   Yes [provider]  White Petrolatum-Mineral Oil [facility] PETROL-MINERAL OIL-LANOLIN) 0.1-0.1 % OINT Apply 1 application to eye every evening.   Yes [provider]  Zinc 50 MG TABS Take 50 mg by mouth daily.    Yes [provider]  predniSONE (DELTASONE) 20 MG tablet Take 2 tablets (40 mg total) by mouth daily with breakfast. For the next four days Patient not taking: Reported on 12/07/2019 10/10/19   Jacqlyn Larsen, PA-C  promethazine (PHENERGAN) 25 MG tablet Take 1 tablet (25 mg total) by mouth every 6 (six) hours as needed for nausea or vomiting. 12/07/19   Sherwood Gambler, MD  traMADol (ULTRAM) 50 MG tablet Take 1-2 tablets (50-100 mg total) by mouth every 6 (six) hours as needed for moderate pain or severe pain. Patient not taking: Reported on 10/10/2019 10/02/16   [name], MD    Allergies    Doxycycline, Aspirin, Ciprofloxacin, Nsaids, Amoxicillin-pot clavulanate, Codeine, Hydrocodone-acetaminophen, Oxycodone, Sulfa antibiotics, and Sulfasalazine  Review of Systems   Review of  Systems All other systems negative except as documented in the HPI. All pertinent positives and negatives as reviewed in the HPI. Physical Exam Updated Vital Signs BP 106/89   Pulse 79   Temp 98.1 F (36.7 C) (Oral)   Resp 11   SpO2 100%   Physical Exam Vitals and nursing note reviewed.  Constitutional:      General: She is not in acute distress.    Appearance: She is well-developed.  HENT:     Head: Normocephalic and atraumatic.  Eyes:     Pupils: Pupils are equal, round, and reactive to light.  Cardiovascular:     Rate and Rhythm: Normal rate and regular rhythm.     Heart sounds: Normal heart sounds. No murmur. No friction rub. No gallop.   Pulmonary:     Effort: Pulmonary effort is normal. No respiratory distress.     Breath sounds: Normal breath sounds. No wheezing.  Abdominal:     General: Bowel sounds are normal. There is distension.     Palpations: Abdomen is soft.     Tenderness: There is generalized abdominal tenderness. There is no guarding or rebound.  Musculoskeletal:     Cervical back: Normal range of motion and neck supple.  Skin:    General: Skin is warm and dry.     Capillary Refill: Capillary refill takes less than 2 seconds.     Findings: No erythema or rash.  Neurological:     Mental Status: She is alert and oriented to person, place, and time.     Motor: No abnormal muscle tone.     Coordination: Coordination normal.  Psychiatric:        Behavior: Behavior normal.     ED Results / Procedures / Treatments   Labs (all labs ordered are listed, but only abnormal results are displayed) Labs Reviewed  COMPREHENSIVE METABOLIC PANEL - Abnormal; Notable for the following components:      Result Value   Calcium 8.8 (*)    Total Protein 5.9 (*)    Albumin 2.9 (*)    All other components within normal limits  CBC WITH DIFFERENTIAL/PLATELET - Abnormal; Notable for the following components:   WBC 14.6 (*)    RBC 3.58 (*)    Hemoglobin 11.2 (*)    HCT  34.0 (*)    Neutro Abs 11.0 (*)    Monocytes Absolute 1.4 (*)    Abs Immature Granulocytes 0.09 (*)    All other components within normal limits  URINALYSIS, ROUTINE W REFLEX MICROSCOPIC - Abnormal; Notable for the following components:   Color, Urine STRAW (*)    All other components within normal limits    EKG EKG Interpretation  Date/Time:  Thursday December 10 2019 19:15:52 EST Ventricular Rate:  83 PR Interval:    QRS Duration: 90 QT Interval:  352 QTC Calculation: 414 R Axis:   82 Text Interpretation: Sinus rhythm Low voltage, precordial leads No significant change since last tracing Confirmed by Fredia Sorrow [phone]) on 12/10/2019 7:27:32 PM   Radiology No results found.  Procedures Procedures (including critical care time)  Medications Ordered in ED Medications  iohexol (OMNIPAQUE) 9 MG/ML oral solution (has no administration in time range)  iohexol (OMNIPAQUE) 9 MG/ML oral solution 500 mL (500 mLs Oral Contrast Given 12/10/19 2038)  iohexol (OMNIPAQUE) 300 MG/ML solution 100 mL (has no administration in time range)  sodium chloride 0.9 % bolus 1,000 mL (1,000 mLs Intravenous New Bag/Given (Non-Interop) 12/10/19 2143)  acetaminophen (TYLENOL) tablet 1,000 mg (1,000 mg Oral Given 12/10/19 2141)    ED Course  I have reviewed the triage vital signs and the nursing notes.  Pertinent labs & imaging results that were available during my care of the patient were reviewed by me and considered in my medical decision making (see chart for details).    MDM Rules/Calculators/A&P                      Patient has had pretty stable blood pressures here in the emergency department this morning is not orthostatic at this point.  She is given a liter of fluid she will have further CT scan imaging because she states her abdomen feels more distended than previously.  Patient awaiting CT scan imaging at this point. Final Clinical Impression(s) / ED Diagnoses Final diagnoses:  None      Rx / DC Orders ED Discharge Orders    None       Dalia Heading, PA-C 12/10/19 2307    Dalia Heading, PA-C 12/10/19 [id number]    Fredia Sorrow, MD 12/19/19 [phone]

## 2019-12-11 ENCOUNTER — Encounter (HOSPITAL_COMMUNITY): Payer: Self-pay | Admitting: Anesthesiology

## 2019-12-11 DIAGNOSIS — Z9641 Presence of insulin pump (external) (internal): Secondary | ICD-10-CM

## 2019-12-11 DIAGNOSIS — N319 Neuromuscular dysfunction of bladder, unspecified: Secondary | ICD-10-CM | POA: Diagnosis not present

## 2019-12-11 DIAGNOSIS — Z888 Allergy status to other drugs, medicaments and biological substances status: Secondary | ICD-10-CM

## 2019-12-11 DIAGNOSIS — H548 Legal blindness, as defined in USA: Secondary | ICD-10-CM | POA: Diagnosis not present

## 2019-12-11 DIAGNOSIS — E063 Autoimmune thyroiditis: Secondary | ICD-10-CM | POA: Diagnosis not present

## 2019-12-11 DIAGNOSIS — Z833 Family history of diabetes mellitus: Secondary | ICD-10-CM | POA: Diagnosis not present

## 2019-12-11 DIAGNOSIS — Z886 Allergy status to analgesic agent status: Secondary | ICD-10-CM

## 2019-12-11 DIAGNOSIS — M3500 Sicca syndrome, unspecified: Secondary | ICD-10-CM

## 2019-12-11 DIAGNOSIS — Z87440 Personal history of urinary (tract) infections: Secondary | ICD-10-CM | POA: Diagnosis not present

## 2019-12-11 DIAGNOSIS — A09 Infectious gastroenteritis and colitis, unspecified: Secondary | ICD-10-CM | POA: Diagnosis not present

## 2019-12-11 DIAGNOSIS — K219 Gastro-esophageal reflux disease without esophagitis: Secondary | ICD-10-CM | POA: Diagnosis not present

## 2019-12-11 DIAGNOSIS — Z79899 Other long term (current) drug therapy: Secondary | ICD-10-CM | POA: Diagnosis not present

## 2019-12-11 DIAGNOSIS — K529 Noninfective gastroenteritis and colitis, unspecified: Secondary | ICD-10-CM

## 2019-12-11 DIAGNOSIS — Z820 Family history of epilepsy and other diseases of the nervous system: Secondary | ICD-10-CM | POA: Diagnosis not present

## 2019-12-11 DIAGNOSIS — Z85820 Personal history of malignant melanoma of skin: Secondary | ICD-10-CM | POA: Diagnosis not present

## 2019-12-11 DIAGNOSIS — Z880 Allergy status to penicillin: Secondary | ICD-10-CM | POA: Diagnosis not present

## 2019-12-11 DIAGNOSIS — Z881 Allergy status to other antibiotic agents status: Secondary | ICD-10-CM

## 2019-12-11 DIAGNOSIS — Z20822 Contact with and (suspected) exposure to covid-19: Secondary | ICD-10-CM | POA: Diagnosis not present

## 2019-12-11 DIAGNOSIS — I10 Essential (primary) hypertension: Secondary | ICD-10-CM | POA: Diagnosis not present

## 2019-12-11 DIAGNOSIS — Z87891 Personal history of nicotine dependence: Secondary | ICD-10-CM | POA: Diagnosis not present

## 2019-12-11 DIAGNOSIS — R933 Abnormal findings on diagnostic imaging of other parts of digestive tract: Secondary | ICD-10-CM

## 2019-12-11 DIAGNOSIS — E109 Type 1 diabetes mellitus without complications: Secondary | ICD-10-CM | POA: Diagnosis not present

## 2019-12-11 DIAGNOSIS — R42 Dizziness and giddiness: Secondary | ICD-10-CM

## 2019-12-11 DIAGNOSIS — Z5329 Procedure and treatment not carried out because of patient's decision for other reasons: Secondary | ICD-10-CM

## 2019-12-11 DIAGNOSIS — E119 Type 2 diabetes mellitus without complications: Secondary | ICD-10-CM | POA: Diagnosis not present

## 2019-12-11 DIAGNOSIS — Z90710 Acquired absence of both cervix and uterus: Secondary | ICD-10-CM | POA: Diagnosis not present

## 2019-12-11 DIAGNOSIS — K921 Melena: Secondary | ICD-10-CM

## 2019-12-11 DIAGNOSIS — Z9049 Acquired absence of other specified parts of digestive tract: Secondary | ICD-10-CM | POA: Diagnosis not present

## 2019-12-11 DIAGNOSIS — E7800 Pure hypercholesterolemia, unspecified: Secondary | ICD-10-CM | POA: Diagnosis not present

## 2019-12-11 DIAGNOSIS — Z882 Allergy status to sulfonamides status: Secondary | ICD-10-CM | POA: Diagnosis not present

## 2019-12-11 DIAGNOSIS — Z8619 Personal history of other infectious and parasitic diseases: Secondary | ICD-10-CM | POA: Diagnosis not present

## 2019-12-11 DIAGNOSIS — Z885 Allergy status to narcotic agent status: Secondary | ICD-10-CM

## 2019-12-11 DIAGNOSIS — Z794 Long term (current) use of insulin: Secondary | ICD-10-CM | POA: Diagnosis not present

## 2019-12-11 LAB — GLUCOSE, CAPILLARY
Glucose-Capillary: 74 mg/dL (ref 70–99)
Glucose-Capillary: 67 mg/dL — ABNORMAL LOW (ref 70–99)
Glucose-Capillary: 126 mg/dL — ABNORMAL HIGH (ref 70–99)
Glucose-Capillary: 71 mg/dL (ref 70–99)
Glucose-Capillary: 99 mg/dL (ref 70–99)

## 2019-12-11 LAB — PROTIME-INR
INR: 1 (ref 0.8–1.2)
Prothrombin Time: 13.3 seconds (ref 11.4–15.2)

## 2019-12-11 LAB — COMPREHENSIVE METABOLIC PANEL
ALT: 28 U/L (ref 0–44)
AST: 36 U/L (ref 15–41)
Albumin: 2.4 g/dL — ABNORMAL LOW (ref 3.5–5.0)
Alkaline Phosphatase: 107 U/L (ref 38–126)
Anion gap: 8 (ref 5–15)
BUN: 6 mg/dL (ref 6–20)
CO2: 26 mmol/L (ref 22–32)
Calcium: 8.3 mg/dL — ABNORMAL LOW (ref 8.9–10.3)
Chloride: 105 mmol/L (ref 98–111)
Creatinine, Ser: 0.8 mg/dL (ref 0.44–1.00)
GFR calc Af Amer: >60 mL/min (ref >60)
GFR calc non Af Amer: >60 mL/min (ref >60)
Glucose, Bld: 75 mg/dL (ref 70–99)
Potassium: 3.7 mmol/L (ref 3.5–5.1)
Sodium: 139 mmol/L (ref 135–145)
Total Bilirubin: 0.6 mg/dL (ref 0.3–1.2)
Total Protein: 5 g/dL — ABNORMAL LOW (ref 6.5–8.1)

## 2019-12-11 LAB — SEDIMENTATION RATE: Sed Rate: 48 mm/hr — ABNORMAL HIGH (ref 0–22)

## 2019-12-11 LAB — CBC
HCT: 30.6 % — ABNORMAL LOW (ref 36.0–46.0)
Hemoglobin: 10.3 g/dL — ABNORMAL LOW (ref 12.0–15.0)
MCH: 31 pg (ref 26.0–34.0)
MCHC: 33.7 g/dL (ref 30.0–36.0)
MCV: 92.2 fL (ref 80.0–100.0)
Platelets: 231 10*3/uL (ref 150–400)
RBC: 3.32 MIL/uL — ABNORMAL LOW (ref 3.87–5.11)
RDW: 13.8 % (ref 11.5–15.5)
WBC: 11.9 10*3/uL — ABNORMAL HIGH (ref 4.0–10.5)
nRBC: 0 % (ref 0.0–0.2)

## 2019-12-11 LAB — C-REACTIVE PROTEIN: CRP: 8.8 mg/dL — ABNORMAL HIGH (ref <1.0)

## 2019-12-11 LAB — PHOSPHORUS: Phosphorus: 4 mg/dL (ref 2.5–4.6)

## 2019-12-11 LAB — SARS CORONAVIRUS 2 (TAT 6-24 HRS): SARS Coronavirus 2: NEGATIVE

## 2019-12-11 LAB — MAGNESIUM: Magnesium: 1.7 mg/dL (ref 1.7–2.4)

## 2019-12-11 MED ORDER — SIMETHICONE 80 MG PO CHEW: 80.0000 mg | CHEWABLE_TABLET | Freq: Four times a day (QID) | ORAL | Status: DC | PRN

## 2019-12-11 MED ORDER — ENOXAPARIN SODIUM 40 MG/0.4ML ~~LOC~~ SOLN
40.0000 mg | Freq: Every day | SUBCUTANEOUS | Status: DC
  Administered 2019-12-11: 40 mg via SUBCUTANEOUS
  Filled 2019-12-11: qty 0.4

## 2019-12-11 MED ORDER — FENTANYL CITRATE (PF) 100 MCG/2ML IJ SOLN
100.0000 ug | Freq: Once | INTRAMUSCULAR | Status: AC
  Administered 2019-12-11: 100 ug via INTRAVENOUS
  Filled 2019-12-11: qty 2

## 2019-12-11 MED ORDER — PEG-KCL-NACL-NASULF-NA ASC-C 100 G PO SOLR
0.5000 | Freq: Once | ORAL | Status: DC
  Filled 2019-12-11: qty 1

## 2019-12-11 MED ORDER — LACTATED RINGERS IV SOLN: INTRAVENOUS | Status: AC

## 2019-12-11 MED ORDER — ENOXAPARIN SODIUM 40 MG/0.4ML ~~LOC~~ SOLN: 40.0000 mg | Freq: Every day | SUBCUTANEOUS | Status: DC

## 2019-12-11 MED ORDER — PEG-KCL-NACL-NASULF-NA ASC-C 100 G PO SOLR: 1.0000 | Freq: Once | ORAL | Status: DC

## 2019-12-11 MED ORDER — DICYCLOMINE HCL 10 MG PO CAPS
10.0000 mg | ORAL_CAPSULE | Freq: Three times a day (TID) | ORAL | Status: DC
  Administered 2019-12-11 (×2): 10 mg via ORAL
  Filled 2019-12-11 (×2): qty 1

## 2019-12-11 MED ORDER — PANTOPRAZOLE SODIUM 40 MG PO TBEC
80.0000 mg | DELAYED_RELEASE_TABLET | Freq: Every day | ORAL | Status: DC
  Administered 2019-12-11: 80 mg via ORAL
  Filled 2019-12-11: qty 2

## 2019-12-11 MED ORDER — PEG-KCL-NACL-NASULF-NA ASC-C 100 G PO SOLR
1.0000 | Freq: Once | ORAL | Status: DC
  Filled 2019-12-11: qty 1

## 2019-12-11 MED ORDER — DEXTROSE 50 % IV SOLN
INTRAVENOUS | Status: AC
  Filled 2019-12-11: qty 50

## 2019-12-11 MED ORDER — ONDANSETRON HCL 4 MG PO TABS: 8.0000 mg | ORAL_TABLET | Freq: Three times a day (TID) | ORAL | Status: DC | PRN

## 2019-12-11 MED ORDER — LEVOTHYROXINE SODIUM 50 MCG PO TABS
50.0000 ug | ORAL_TABLET | Freq: Every day | ORAL | Status: DC
  Filled 2019-12-11: qty 1

## 2019-12-11 MED ORDER — METHYLPREDNISOLONE SODIUM SUCC 40 MG IJ SOLR
20.0000 mg | Freq: Three times a day (TID) | INTRAMUSCULAR | Status: DC
  Administered 2019-12-11: 20 mg via INTRAVENOUS
  Filled 2019-12-11: qty 1

## 2019-12-11 MED ORDER — FENTANYL CITRATE (PF) 100 MCG/2ML IJ SOLN: 25.0000 ug | Freq: Once | INTRAMUSCULAR | Status: DC | PRN

## 2019-12-11 MED ORDER — CYCLOSPORINE 0.05 % OP EMUL
1.0000 [drp] | Freq: Two times a day (BID) | OPHTHALMIC | Status: DC
  Administered 2019-12-11: 1 [drp] via OPHTHALMIC
  Filled 2019-12-11 (×2): qty 1

## 2019-12-11 MED ORDER — LACTATED RINGERS IV BOLUS
1000.0000 mL | Freq: Once | INTRAVENOUS | Status: AC
  Administered 2019-12-11: 1000 mL via INTRAVENOUS

## 2019-12-11 MED ORDER — ACETAMINOPHEN 325 MG PO TABS
650.0000 mg | ORAL_TABLET | Freq: Four times a day (QID) | ORAL | Status: DC | PRN
  Administered 2019-12-11: 650 mg via ORAL
  Filled 2019-12-11: qty 2

## 2019-12-11 MED ORDER — DEXTROSE 50 % IV SOLN
12.5000 g | INTRAVENOUS | Status: AC
  Administered 2019-12-11: 12.5 g via INTRAVENOUS

## 2019-12-11 MED ORDER — INSULIN PUMP
SUBCUTANEOUS | Status: DC
  Filled 2019-12-11: qty 1

## 2019-12-11 MED ORDER — DICYCLOMINE HCL 10 MG PO CAPS: 10.0000 mg | ORAL_CAPSULE | Freq: Three times a day (TID) | ORAL | Status: DC

## 2019-12-11 MED ORDER — SODIUM CHLORIDE 0.9 % IV SOLN: 8.0000 mg | Freq: Three times a day (TID) | INTRAVENOUS | Status: DC | PRN

## 2019-12-11 NOTE — Progress Notes (Signed)
Clarified with infection prevention, Cherrie Spelgea, regarding pt needing COVID test.  The pt was negative on 12/07/19 and discharged. Pt was readmitted on 12/10/19 and a COVID test was not ordered. According to hospital protocol and Infection prevention the pt will need to be tested for COVID. [facility] awaiting response.  Paulla Fore, RN, BSN

## 2019-12-11 NOTE — Progress Notes (Signed)
MD paged again at second call number [phone],  Dr Koleen Distance returned call,  Information given as noted in previous note. She will call the pt in her room and talk with her.

## 2019-12-11 NOTE — Progress Notes (Signed)
   Subjective: [facility] is disappointed she is back in the hospital. She says she continued to have abdominal pain which progressed to back pain once she got home. Her bowel movements have slowed, but when she goes it is primarily bright red mucous. Pain worsens after eating. Her pain is currently controlled after receiving fentanyl a few hours ago.   We discussed that we are now leaning more towards an autoimmune process as opposed to infectious colitis and would like to consult GI for a potential procedure. Pt was understanding and agreeable.  Objective:  Vital signs in last 24 hours: Vitals:   12/11/19 0430 12/11/19 0517 12/11/19 0520 12/11/19 1003  BP: (!) 102/55 108/62  116/65  Pulse: 70 72  70  Resp: 17 16  16  Temp:  98.5 F (36.9 C)  98.5 F (36.9 C)  TempSrc:  Oral  Oral  SpO2: 93% 97%  96%  Weight:   65.9 kg   Height:   5' 8.5" (1.74 m)    General: awake, alert, sitting up in bed in NAD Abd: BS+, abdomen soft, diffusely tender to palpation  Skin: warm and dry  Assessment/Plan:  Active Problems:   Type 1 diabetes mellitus (HCC)   Hashimoto's disease   Acute colitis   Dizziness   Colitis  Mary Erickson is a 53 year old F with significant PMH of type I diabetes, Hashimoto's, Sjogren's syndrome, and HTN, who is being readmitted for worsening colitis.   Acute Colitis - worsening CT abdomen/pelvis on admission with worsening colitis, specifically from the splenic flexure to rectosigmoid colon. GI pathogen panel and c-diff were negative at her prior admission. Pt continuing to have blood BMs approximately 3 times per day. - worsening presentation despite supportive care and antibiotics is less consistent with infectious process more concerning for inflammatory etiology - CRP elevated to 8.8, ESR elevated to 48 - leukocytosis improving since last admission (11.9 << 14.6 << 17.3) - pt remains hemodynamically stable, with negative orthostatics on admission GI consulted,  appreciate their recommendations - considering IBD vs infectious vs ischemic vs other - colonoscopy scheduled for 1/30 - clear liquid diet today and NPO after midnight - Movi prep to begin at 2pm today - EGD can be done outpatient, if she feels this is pressing  Medication management with - simethicone 80 mg four times daily PRN and bentyl 10 mg three times daily  - continue pantoprazole 80 mg daily - acetaminophen 650 mg Q6HR PRN for mild-moderate pain - will defer opioids for pain management at this time given pt's moderate stool burden on CT imaging and risk of worsening constipation  T2DM:  Pt with home insulin pump - please allow pt to self administer through her pump - should she need steroids, will need to adjust insulin regimen  Neurogenic bladder: Patient self caths at home. - continue to allow pt to self cath and ensure there are kits available to her in the room  Dispo: Anticipated discharge in approximately 2-3 day(s).   Jones, Ellen, MD 12/11/2019, 12:09 PM Pager: [phone]  

## 2019-12-11 NOTE — Progress Notes (Addendum)
Inpatient Diabetes Program Recommendations  AACE/ADA: New Consensus Statement on Inpatient Glycemic Control (2015)  Target Ranges:  Prepandial:   less than 140 mg/dL      Peak postprandial:   less than 180 mg/dL (1-2 hours)      Critically ill patients:  140 - 180 mg/dL   Lab Results  Component Value Date   GLUCAP 126 (H) 12/11/2019    Review of Glycemic Control Results for Mary [facility]kson, Mary [facility]kson (MRN [id number]) as of 12/11/2019 10:18  Ref. Range 12/11/2019 05:18 12/11/2019 07:31 12/11/2019 08:48  Glucose-Capillary Latest Ref Range: 70 - 99 mg/dL 74 67 (L) 126 (H)   Diabetes history: DM 1 Outpatient Diabetes medications:  Endocrinologist: Dr. Kathlene Cote with [facility] seen 09/07/2019--Pump settings at that visit wer as follows: Basal Rates: 12am- 0.45units/hr 6:30am- 0.55 units/hr Total Basal per 24 hour period= 12.55 units Insulin to Carb Ratio: 12am- 1 unit for every 12 grams Carbs 6am- 1 unit for every 11 grams Carbs Sensitivity Factor: 1 unit for every 50 mg/dl above goal CBG Goal CBG 100 mg/dl Current orders for Inpatient glycemic control:  Insulin pump per home settings  Inpatient Diabetes Program Recommendations:    Continue insulin pump per home settings.  Patient was seen by diabetes coordinator during previous admission. Will follow and speak to patient to make sure that she has all her supplies while admitted.   Thanks  Adah Perl, RN, BC-ADM Inpatient Diabetes Coordinator Pager [phone] (8a-5p)  [phone]- Spoke with patient. She has supplies.  Plans to see her endocrinologist on Monday.  No needs at this time.

## 2019-12-11 NOTE — ED Notes (Signed)
Admitting MD paged for pain medication as pt is reporting pain.  Await call back

## 2019-12-11 NOTE — Progress Notes (Signed)
Pt upset over changes in medication orders ie solumedrol, GI PA Ellouise Newer contacted to speak with pt of plan for colonoscopy and medication changes.  After speaking with her the pt called GI MD office, spoke with MD, and plan is that MD will be coming to see pt sometime this evening to talk with pt (all per pt discussed with unit assistant director Laverda Sorenson, RN). Requested pt to let the unit RN know when MD comes and if she is agreeing to have bowel prep started this evening for colonoscopy tomorrow. Pt stated she would let nurse know.

## 2019-12-11 NOTE — Progress Notes (Signed)
Received page by nursing staff. States that she pulled out her line and walked out AMA . She was informed that she needed to wait to speak with a physician and also sign AMA paperwork but she left without doing either. Patient already out of the building per staff.

## 2019-12-11 NOTE — Consult Note (Addendum)
Consultation  Referring Provider: Dr. Evette Doffing     Primary Care Physician:  Nicola Girt, DO Primary Gastroenterologist:  [facility] -Dr. Dorrene German       Reason for Consultation: Colitis             HPI:   Mary Erickson is a 54 y.o. female with a past medical history including Hashimoto's thyroiditis, Sjogren's syndrome, insulin-dependent diabetes and others listed below, who presented to the [facility], ER for dizziness and abdominal distention.    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  Tells me that her autoimmune disease of Hashimoto's also plays a role.  Apparently was always told to eat a high-fiber diet which never worked for her but started MiraLAX which helped until her Hashimoto's went out of control, then had to be on Linzess for a period of time, most recently has been using stool softeners and MiraLAX which was helping.  In fact, she was feeling "the best I ever have before this all started".  Tells me that prior to her first admission she started with a lot of abdominal pain and weakness and multiple loose/bloody bowel movements.  Tells me she never really got any better while in the hospital and even the nurse stated "I am surprised they are letting you go home".    Tells me that upon getting home she continued with mostly lower abdominal pain which hurt soon after eating and cramping which is all over her abdomen, she continued to go to the bathroom every 30 minutes or so and when she would go to the bathroom she would urinate and felt like she was just becoming more and more dehydrated.  Continued with bowel movements every 30 minutes to an hour which turned to just liquid and then just bright red blood.  She re-presented to the ER.  Associated symptoms include some  nausea and near syncope.  Patient says she has looked up IBD and it is said that the pain from this radiates into your back and "I certainly have a lot of back pain".  She was able to tolerate some toast and broth prior to coming to the hospital and some salmon yesterday.    Explains that she was set up to have an EGD and colonoscopy as an outpatient but these were to be done in [facility] and "I live in [place] and don't want to drive all the way there for procedures".  Explained that they were going to do an EGD as well due to patient's history of nausea as well as her uncontrolled reflux.  Tells me typically she takes Omeprazole 40 mg daily which sometimes helps but, but occasionally she needs something in addition to this.  She has also heard that she should take "breaks from this medicine", so she does this as well and will just drink baking soda and water.    Reports last colonoscopy was in 2014 by Dr. Isaac Laud while she was in the hospital with acute hepatitis B.  Apparently at that time had severe abdominal pain and constipation so they proceed with a colonoscopy.  She was told she had hemorrhoids and a "twisted colon".    Of note patient seems very disgruntled about her last hospital stay and "I do not  know why I came back here".    Also reports a family history of MS in her father diagnosed in his 59s, so this is always in the back of her head.    Denies of fever or chills.     ER course: CT abdomen pelvis with contrast: Severe wall thickening of the colon extending from the splenic flexure to the rectosigmoid colon consistent with colitis of infectious, inflammatory or ischemic etiology; findings have worsened since CT 12/07/2019; large volume of fecal debris within the right and transverse colon, proximal blood colitis changes, no dilated small bowel to suggest bowel obstruction; new abdominal pelvic ascites, no free air, ascites in the pelvis may be slightly loculated; thick-walled  bladder  Recent GI history: 12/07/2019-12/09/2019: Patient hospitalized after presenting with severe acute onset diarrhea with CT remarkable for acute colitis, treated with supportive care empirically with azithromycin 1000 mg x 1, patient had symptomatic improvement was discharged with Bentyl and Gas-X for persistent abdominal pain/cramping instructed to follow-up with outpatient GI  Past Medical History:  Diagnosis Date  . Acid reflux   . Anemia   . Arthritis    "elbows, hands, neck, shoulders" (10/02/2016)  . Calcium blood increased   . Chronic neck pain   . Chronic UTI (urinary tract infection)    "from self caths" (10/02/2016)  . Hepatitis B    acute hepatitis B from lancet  . History of alcoholism ([place])    10/02/2016 "sober since 03/14/2005"  . History of blood transfusion 1984; 1989; 2008   "quite a few"  . History of shortness of breath   . Hypercholesteremia   . Hypertension    pt. states that she has only had low blood pressure  . Hypothyroidism   . Internal carotid aneurysm dx'd early 2017  . Legally blind in left eye, as defined in [place]   . Melanoma ([place])    left upper arm  . Migraines    "quite a few in a month; at least 15" (10/02/2016)  . Neuropathy    neck; "not sure if it's from DM or Sjogren's" (10/02/2016)  . Pneumonia 1986   "1 yr after major OR when I was under anesthesia for 13 hr"  . Seizures ([place]) ~ 2004; ~ 2013   "seizures from diabetes "  . Self-catheterizes urinary bladder    "since OR in 1984" (10/02/2016)  . Sjogren's syndrome ([place])   . Thyroid goiter    "still present" (10/02/2016)  . Type II diabetes mellitus ([place])   . Urinary retention    self caths  . Wears glasses     Past Surgical History:  Procedure Laterality Date  . ABDOMINAL HYSTERECTOMY  2008  . ECTOPIC PREGNANCY SURGERY  1989  . EXCISION MELANOMA WITH SENTINEL LYMPH NODE BIOPSY Left 10/02/2016   Procedure: WIDE LOCAL EXCISION AND ADVANCEMENT FLAP CLOSURE LEFT UPPER  MELANOMA  WITH SENTINEL LYMPH NODE MAPPING AND BIOPSY;  Surgeon: [name], MD;  Location: [facility];  Service: General;  Laterality: Left;  . EYE SURGERY    . LAPAROSCOPIC CHOLECYSTECTOMY  ~ 01/2016  . MELANOMA EXCISION WITH SENTINEL LYMPH NODE BIOPSY Left 10/02/2016   WIDE LOCAL EXCISION AND ADVANCEMENT FLAP CLOSURE LEFT UPPER  MELANOMA WITH SENTINEL LYMPH NODE MAPPING AND BIOPSY  . SHOULDER ARTHROSCOPY W/ ROTATOR CUFF REPAIR Right 2014   "partial"  . STRABISMUS SURGERY Left 1971  . TUMOR EXCISION  1984   from tail bone and part of tail bone removed    History reviewed.  No pertinent family history.   Social History   Tobacco Use  . Smoking status: Former Smoker    Packs/day: 1.50    Years: 24.00    Pack years: 36.00    Types: Cigarettes    Quit date: 2008    Years since quitting: 13.0  . Smokeless tobacco: Never Used  Substance Use Topics  . Alcohol use: No    Comment: 10/02/2016 "sober since 03/14/2005"  . Drug use: Yes    Types: Marijuana    Comment: 10/02/2016 " occasional; nothing since before 2006"    Prior to Admission medications   Medication Sig Start Date End Date Taking? Authorizing Provider  acetaminophen (TYLENOL) 325 MG tablet Take 650 mg by mouth every 6 (six) hours as needed for mild pain.   Yes [provider]  Ascorbic Acid (VITAMIN C) 1000 MG tablet Take 1,000 mg by mouth daily.   Yes [provider]  Cholecalciferol (D3-1000) 25 MCG (1000 UT) capsule Take 1,000 Units by mouth daily.    Yes [provider]  Cyanocobalamin (B-12) 2500 MCG TABS Take 2,500 mcg by mouth daily.    Yes [provider]  cycloSPORINE (RESTASIS) 0.05 % ophthalmic emulsion Place 1 drop into both eyes 2 (two) times daily.   Yes [provider]  dicyclomine (BENTYL) 10 MG capsule Take 1 capsule (10 mg total) by mouth 4 (four) times daily -  before meals and at bedtime for 5 days. 12/09/19 12/14/19 Yes Ladona Horns, MD  ibuprofen (ADVIL) 200 MG tablet Take  200-400 mg by mouth every 6 (six) hours as needed for mild pain.   Yes [provider]  insulin aspart (NOVOLOG) 100 UNIT/ML injection Inject 50-60 Units into the skin See admin instructions. Insulin pump basil rate 50-60 units throughout day. Additional 0-5 units per bolos.   Yes [provider]  Insulin Detemir (LEVEMIR FLEXTOUCH) 100 UNIT/ML Pen Inject 4 Units into the skin 2 (two) times daily as needed (in case pump breaks).  09/27/17  Yes [provider]  Insulin Human (INSULIN PUMP) SOLN Inject into the skin.   Yes [provider]  Levothyroxine Sodium 50 MCG CAPS Take 50 mcg by mouth daily. 11/10/19  Yes [provider]  Magnesium 400 MG CAPS Take 400 mg by mouth daily.   Yes [provider]  Multiple Vitamins-Minerals (MULTIVITAMINS THER. W/MINERALS) TABS Take 1 tablet by mouth daily.   Yes [provider]  Naphazoline HCl (CLEAR EYES OP) Place 1 drop into both eyes daily.   Yes [provider]  naproxen sodium (ALEVE) 220 MG tablet Take 220 mg by mouth as needed (pain).   Yes [provider]  Omega-3 Fatty Acids (FISH OIL) 1200 MG CAPS Take 1,200 mg by mouth 2 (two) times daily.    Yes [provider]  omeprazole (PRILOSEC) 40 MG capsule Take 40 mg by mouth daily. 05/04/19  Yes [provider]  ondansetron (ZOFRAN ODT) 4 MG disintegrating tablet Take 1 tablet (4 mg total) by mouth every 8 (eight) hours as needed for nausea or vomiting. 12/07/19  Yes Sherwood Gambler, MD  prochlorperazine (COMPAZINE) 10 MG tablet Take 1 tablet (10 mg total) by mouth 2 (two) times daily as needed for nausea or vomiting. 10/10/19  Yes Jacqlyn Larsen, PA-C  simethicone (MYLICON) 80 MG chewable tablet Chew 1 tablet (80 mg total) by mouth 4 (four) times daily as needed for up to 5 days for flatulence (abdominal gas pain). 12/09/19 12/14/19 Yes  Ladona Horns, MD  vitamin E (VITAMIN E) 400 UNIT capsule Take 400 Units by mouth  daily.   Yes [provider]  White Petrolatum-Mineral Oil [facility] PETROL-MINERAL OIL-LANOLIN) 0.1-0.1 % OINT Apply 1 application to eye every evening.   Yes [provider]  Zinc 50 MG TABS Take 50 mg by mouth daily.    Yes [provider]  predniSONE (DELTASONE) 20 MG tablet Take 2 tablets (40 mg total) by mouth daily with breakfast. For the next four days Patient not taking: Reported on 12/07/2019 10/10/19   Jacqlyn Larsen, PA-C  promethazine (PHENERGAN) 25 MG tablet Take 1 tablet (25 mg total) by mouth every 6 (six) hours as needed for nausea or vomiting. 12/07/19   Sherwood Gambler, MD  traMADol (ULTRAM) 50 MG tablet Take 1-2 tablets (50-100 mg total) by mouth every 6 (six) hours as needed for moderate pain or severe pain. Patient not taking: Reported on 10/10/2019 10/02/16   [name], MD    Current Facility-Administered Medications  Medication Dose Route Frequency Provider Last Rate Last Admin  . acetaminophen (TYLENOL) tablet 650 mg  650 mg Oral Q6H PRN Mosetta Anis, MD   650 mg at 12/11/19 1032  . cycloSPORINE (RESTASIS) 0.05 % ophthalmic emulsion 1 drop  1 drop Both Eyes BID Mosetta Anis, MD   1 drop at 12/11/19 0941  . dextrose 50 % solution           . dicyclomine (BENTYL) capsule 10 mg  10 mg Oral TID AC & HS Mosetta Anis, MD   10 mg at 12/11/19 0916  . enoxaparin (LOVENOX) injection 40 mg  40 mg Subcutaneous Daily Mosetta Anis, MD   40 mg at 12/11/19 0940  . fentaNYL (SUBLIMAZE) injection 25 mcg  25 mcg Intravenous Once PRN Mosetta Anis, MD      . insulin pump   Subcutaneous Q4H Mosetta Anis, MD      . levothyroxine (SYNTHROID) tablet 50 mcg  50 mcg Oral Daily Mosetta Anis, MD      . methylPREDNISolone sodium succinate (SOLU-MEDROL) 40 mg/mL injection 20 mg  20 mg Intravenous Q8H [place], Carley D, DO   20 mg at 12/11/19 0914  . ondansetron (ZOFRAN) 8 mg in sodium chloride 0.9 % 50 mL IVPB  8 mg Intravenous Q8H PRN Mosetta Anis, MD       Or  .  ondansetron [facility]) tablet 8 mg  8 mg Oral Q8H PRN Mosetta Anis, MD      . pantoprazole (PROTONIX) EC tablet 80 mg  80 mg Oral Daily Mosetta Anis, MD   80 mg at 12/11/19 0940  . simethicone (MYLICON) chewable tablet 80 mg  80 mg Oral QID PRN Mosetta Anis, MD        Allergies as of 12/10/2019 - Review Complete 12/10/2019  Allergen Reaction Noted  . Doxycycline Other (See Comments) 04/08/2014  . Aspirin Other (See Comments) 02/29/2016  . Ciprofloxacin Hives 04/06/2014  . Nsaids Other (See Comments) 04/10/2016  . Amoxicillin-pot clavulanate Nausea Only 12/30/2017  . Codeine Nausea And Vomiting 01/18/2012  . Hydrocodone-acetaminophen Nausea And Vomiting 01/31/2016  . Oxycodone Nausea And Vomiting 01/31/2016  . Sulfa antibiotics Nausea And Vomiting 01/18/2012  . Sulfasalazine Nausea And Vomiting 01/18/2012     Review of Systems:    Constitutional: No fever or chills Skin: No rash  Cardiovascular: No chest pain Respiratory: No SOB  Gastrointestinal: See HPI and otherwise negative Genitourinary:  No dysuria  Neurological: No headache Musculoskeletal: No new muscle or joint pain Hematologic: No bruising Psychiatric: No history of depression or anxiety    Physical Exam:  Vital signs in last 24 hours: Temp:  [98.1 F (36.7 C)-98.5 F (36.9 C)] 98.5 F (36.9 C) (01/29 1003) Pulse Rate:  [70-86] 70 (01/29 1003) Resp:  [10-18] 16 (01/29 1003) BP: (97-125)/(55-89) 116/65 (01/29 1003) SpO2:  [91 %-100 %] 96 % (01/29 1003) Weight:  [65.9 kg] 65.9 kg (01/29 0520)   General:   Pleasant Caucasian female appears to be in NAD, Well developed, Well nourished, alert and cooperative Head:  Normocephalic and atraumatic. Eyes:   PEERL, EOMI. No icterus. Conjunctiva pink. Ears:  Normal auditory acuity. Neck:  Supple Throat: Oral cavity and pharynx without inflammation, swelling or lesion. Teeth in good condition. Lungs: Respirations even and unlabored. Lungs clear to auscultation  bilaterally.   No wheezes, crackles, or rhonchi.  Heart: Normal S1, S2. No MRG. Regular rate and rhythm. No peripheral edema, cyanosis or pallor.  Abdomen:  Soft, mild distension, Marked ttp in LLQ, Moderate ttp in RLQ. No rebound or guarding. Normal bowel sounds. No appreciable masses or hepatomegaly. Rectal:  Not performed.  Msk:  Symmetrical without gross deformities. Peripheral pulses intact.  Extremities:  Without edema, no deformity or joint abnormality.  Neurologic:  Alert and  oriented x4;  grossly normal neurologically.  Skin:   Dry and intact without significant lesions or rashes. Psychiatric: Demonstrates good judgement and reason without abnormal affect or behaviors.  LAB RESULTS: Recent Labs    12/09/19 0329 12/10/19 1819 12/11/19 0707  WBC 17.3* 14.6* 11.9*  HGB 11.2* 11.2* 10.3*  HCT 33.5* 34.0* 30.6*  PLT 191 214 231   BMET Recent Labs    12/09/19 0329 12/10/19 1819 12/11/19 0707  NA 134* 141 139  K 4.1 3.5 3.7  CL 103 102 105  CO2 26 28 26   GLUCOSE 93 70 75  BUN 7 10 6   CREATININE 0.79 0.77 0.80  CALCIUM 7.9* 8.8* 8.3*   LFT Recent Labs    12/11/19 0707  PROT 5.0*  ALBUMIN 2.4*  AST 36  ALT 28  ALKPHOS 107  BILITOT 0.6   PT/INR Recent Labs    12/11/19 0114  LABPROT 13.3  INR 1.0    STUDIES: CT Abdomen Pelvis W Contrast  Result Date: 12/10/2019 CLINICAL DATA:  Abdomen distension EXAM: CT ABDOMEN AND PELVIS WITH CONTRAST TECHNIQUE: Multidetector CT imaging of the abdomen and pelvis was performed using the standard protocol following bolus administration of intravenous contrast. CONTRAST:  17mL OMNIPAQUE IOHEXOL 300 MG/ML  SOLN COMPARISON:  CT 12/07/2019, 02/03/2013 FINDINGS: Lower chest: Linear densities in the right middle lobe, lingula and bases felt most consistent with atelectasis. Normal heart size. Hepatobiliary: 3.6 cm hypodense lesion in the right hepatic lobe, most likely hemangioma. Probably stable allowing for differences in  measurement technique. Status post cholecystectomy. No biliary dilatation Pancreas: Unremarkable. No pancreatic ductal dilatation or surrounding inflammatory changes. Spleen: Normal in size without focal abnormality. Adrenals/Urinary Tract: Adrenal glands are normal. Kidneys show no hydronephrosis. Slightly thick-walled urinary bladder Stomach/Bowel: Stomach is nonenlarged. No dilated small bowel. Large volume of stool within the right and transverse colon. Severe wall thickening extending from splenic flexure to the rectosigmoid colon, appears worse compared with 12/07/2019. Negative appendix. Vascular/Lymphatic: Mild aortic atherosclerosis. No aneurysm. Subcentimeter retroperitoneal lymph nodes. Reproductive: No adnexal masses.  Multiple clips in the pelvis. Other: No free air. Small amount of abdominopelvic ascites. Ascites in  the pelvis may be slightly loculated. Musculoskeletal: No acute or suspicious abnormality. IMPRESSION: 1. Severe wall thickening of the colon extending from the splenic flexure to the rectosigmoid colon consistent with colitis of infectious, inflammatory, or ischemic etiology. Findings have worsened since CT 12/07/2019. Large volume of fecal debris within the right and transverse colon, proximal to the colitis changes. No dilated small bowel to suggest bowel obstruction. 2. New abdominopelvic ascites. No free air. Ascites within the pelvis may be slightly loculated. 3. Thick-walled urinary bladder. Electronically Signed   By: Donavan Foil M.D.   On: 12/10/2019 23:51    Impression / Plan:   Impression: 1.  Acute colitis: Patient discharged following recent admission for acute colitis on 12/09/2019, symptoms worsened at home and she represented with a CT as above showing worsening of colitis, during last admission GI PCR and C. difficile were negative, treated empirically with 1 dose of Azithromycin for infectious colitis, now think symptoms may be inflammatory in nature, white count has  trended down 17.3--> 11.9 ; consider IBD versus infectious cause versus ischemic versus other 2.  Neurogenic bladder: Self caths at home 3.  GERD: Minimally controlled on Omeprazole 40 mg daily, also with some nausea occasionally  Plan: 1.  We will stop Prednisone for now as we are not really sure what we are treating. 2.  Patient will be scheduled for colonoscopy 12/12/2019 with Dr. Tarri Glenn.  Did discuss risks, benefits, limitations and alternatives the patient agrees to proceed. 3.  Covid testing is pending 4.  Patient will be on clear liquid diet today and n.p.o. after midnight.  She will start movi prep this afternoon. 5.  Continue other supportive measures 6.  Discussed above with Dr. Tarri Glenn.  Explained to the patient at time of interview that an EGD is not really pressing at this moment, she can likely have this done as an outpatient if felt necessary.  She was agreeable. 7. Please await any final recommendations from Dr. Tarri Glenn.  Thank you for your kind consultation, we will continue to follow.  Lavone Nian [facility]  12/11/2019, 10:36 AM

## 2019-12-11 NOTE — ED Notes (Signed)
[facility] contacted and paperwork printed  

## 2019-12-11 NOTE — ED Notes (Signed)
ED TO INPATIENT HANDOFF REPORT  Name/Age/Gender Mary Erickson 54 y.o. female  Code Status Code Status History    Date Active Date Inactive Code Status Order ID Comments User Context   12/07/2019 1342 12/09/2019 2049 Full Code [id number]  Welford Roche, MD ED   10/02/2016 1458 10/03/2016 1353 Full Code [id number]  [name], MD Inpatient   Advance Care Planning Activity      Home/SNF/Other Home  Chief Complaint Dizziness [R42] Acute colitis [K52.9]  Level of Care/Admitting Diagnosis ED Disposition    ED Disposition Condition [place]: [facility] [id number]  Level of Care: Telemetry Medical [id number]  Covid Evaluation: Confirmed COVID Negative  Date Laboratory Confirmed COVID Negative: 12/07/2019  Diagnosis: Acute colitis [id number]  Admitting Physician: Axel Filler [id number]  Attending Physician: Axel Filler [id number]       Medical History Past Medical History:  Diagnosis Date  . Acid reflux   . Anemia   . Arthritis    "elbows, hands, neck, shoulders" (10/02/2016)  . Calcium blood increased   . Chronic neck pain   . Chronic UTI (urinary tract infection)    "from self caths" (10/02/2016)  . Hepatitis B    acute hepatitis B from lancet  . History of alcoholism ([place])    10/02/2016 "sober since 03/14/2005"  . History of blood transfusion 1984; 1989; 2008   "quite a few"  . History of shortness of breath   . Hypercholesteremia   . Hypertension    pt. states that she has only had low blood pressure  . Hypothyroidism   . Internal carotid aneurysm dx'd early 2017  . Legally blind in left eye, as defined in [place]   . Melanoma ([place])    left upper arm  . Migraines    "quite a few in a month; at least 15" (10/02/2016)  . Neuropathy    neck; "not sure if it's from DM or Sjogren's" (10/02/2016)  . Pneumonia 1986   "1 yr after major OR when I was under anesthesia for 13 hr"  . Seizures ([place]) ~ 2004; ~  2013   "seizures from diabetes "  . Self-catheterizes urinary bladder    "since OR in 1984" (10/02/2016)  . Sjogren's syndrome ([place])   . Thyroid goiter    "still present" (10/02/2016)  . Type II diabetes mellitus ( [place])   . Urinary retention    self caths  . Wears glasses     Allergies Allergies  Allergen Reactions  . Doxycycline Other (See Comments)    Elevates liver enzymes   . Aspirin Other (See Comments)    Can take 81 mg but has to be coated; No other Aspirin due to bleeding in stools from ulcers  . Ciprofloxacin Hives    Per pt, she can tolerate oral but not IV  . Nsaids Other (See Comments)    Causes kidney and skin problems Kidney and skin problems  . Amoxicillin-Pot Clavulanate Nausea Only  . Codeine Nausea And Vomiting  . Hydrocodone-Acetaminophen Nausea And Vomiting  . Oxycodone Nausea And Vomiting  . Sulfa Antibiotics Nausea And Vomiting  . Sulfasalazine Nausea And Vomiting    IV Location/Drains/Wounds Patient Lines/Drains/Airways Status   Active Line/Drains/Airways    Name:   Placement date:   Placement time:   Site:   Days:   Peripheral IV 12/10/19 Right Wrist   12/10/19    1909    Wrist   1   Incision (Closed) 10/02/16  Arm Left   10/02/16    1143     1165   Incision (Closed) 10/02/16 Axilla Left   10/02/16    1143     1165          Labs/Imaging Results for orders placed or performed during the hospital encounter of 12/10/19 (from the past 48 hour(s))  Comprehensive metabolic panel     Status: Abnormal   Collection Time: 12/10/19  6:19 PM  Result Value Ref Range   Sodium 141 135 - 145 mmol/L   Potassium 3.5 3.5 - 5.1 mmol/L   Chloride 102 98 - 111 mmol/L   CO2 28 22 - 32 mmol/L   Glucose, Bld 70 70 - 99 mg/dL   BUN 10 6 - 20 mg/dL   Creatinine, Ser 0.77 0.44 - 1.00 mg/dL   Calcium 8.8 (L) 8.9 - 10.3 mg/dL   Total Protein 5.9 (L) 6.5 - 8.1 g/dL   Albumin 2.9 (L) 3.5 - 5.0 g/dL   AST 19 15 - 41 U/L   ALT 21 0 - 44 U/L   Alkaline Phosphatase 76  38 - 126 U/L   Total Bilirubin 0.6 0.3 - 1.2 mg/dL   GFR calc non Af Amer >60 >60 mL/min   GFR calc Af Amer >60 >60 mL/min   Anion gap 11 5 - 15    Comment: Performed at [facility], [place] [address] [place], [place] [address]  CBC with Differential     Status: Abnormal   Collection Time: 12/10/19  6:19 PM  Result Value Ref Range   WBC 14.6 (H) 4.0 - 10.5 K/uL   RBC 3.58 (L) 3.87 - 5.11 MIL/uL   Hemoglobin 11.2 (L) 12.0 - 15.0 g/dL   HCT 34.0 (L) 36.0 - 46.0 %   MCV 95.0 80.0 - 100.0 fL   MCH 31.3 26.0 - 34.0 pg   MCHC 32.9 30.0 - 36.0 g/dL   RDW 13.9 11.5 - 15.5 %   Platelets 214 150 - 400 K/uL   nRBC 0.0 0.0 - 0.2 %   Neutrophils Relative % 75 %   Neutro Abs 11.0 (H) 1.7 - 7.7 K/uL   Lymphocytes Relative 12 %   Lymphs Abs 1.8 0.7 - 4.0 K/uL   Monocytes Relative 10 %   Monocytes Absolute 1.4 (H) 0.1 - 1.0 K/uL   Eosinophils Relative 1 %   Eosinophils Absolute 0.2 0.0 - 0.5 K/uL   Basophils Relative 1 %   Basophils Absolute 0.1 0.0 - 0.1 K/uL   Immature Granulocytes 1 %   Abs Immature Granulocytes 0.09 (H) 0.00 - 0.07 K/uL    Comment: Performed at [facility], [place] [address], [place], [place] [address]  Urinalysis, Routine w reflex microscopic     Status: Abnormal   Collection Time: 12/10/19  8:00 PM  Result Value Ref Range   Color, Urine STRAW (A) YELLOW   APPearance CLEAR CLEAR   Specific Gravity, Urine 1.005 1.005 - 1.030   pH 8.0 5.0 - 8.0   Glucose, UA NEGATIVE NEGATIVE mg/dL   Hgb urine dipstick NEGATIVE NEGATIVE   Bilirubin Urine NEGATIVE NEGATIVE   Ketones, ur NEGATIVE NEGATIVE mg/dL   Protein, ur NEGATIVE NEGATIVE mg/dL   Nitrite NEGATIVE NEGATIVE   Leukocytes,Ua NEGATIVE NEGATIVE    Comment: Performed at [place] [address] [place],  [address]   CT Abdomen Pelvis W Contrast  Result Date: 12/10/2019 CLINICAL DATA:  Abdomen distension EXAM: CT ABDOMEN  AND PELVIS WITH CONTRAST  TECHNIQUE: Multidetector CT imaging of the abdomen and pelvis was performed using the standard protocol following bolus administration of intravenous contrast. CONTRAST:  181mL OMNIPAQUE IOHEXOL 300 MG/ML  SOLN COMPARISON:  CT 12/07/2019, 02/03/2013 FINDINGS: Lower chest: Linear densities in the right middle lobe, lingula and bases felt most consistent with atelectasis. Normal heart size. Hepatobiliary: 3.6 cm hypodense lesion in the right hepatic lobe, most likely hemangioma. Probably stable allowing for differences in measurement technique. Status post cholecystectomy. No biliary dilatation Pancreas: Unremarkable. No pancreatic ductal dilatation or surrounding inflammatory changes. Spleen: Normal in size without focal abnormality. Adrenals/Urinary Tract: Adrenal glands are normal. Kidneys show no hydronephrosis. Slightly thick-walled urinary bladder Stomach/Bowel: Stomach is nonenlarged. No dilated small bowel. Large volume of stool within the right and transverse colon. Severe wall thickening extending from splenic flexure to the rectosigmoid colon, appears worse compared with 12/07/2019. Negative appendix. Vascular/Lymphatic: Mild aortic atherosclerosis. No aneurysm. Subcentimeter retroperitoneal lymph nodes. Reproductive: No adnexal masses.  Multiple clips in the pelvis. Other: No free air. Small amount of abdominopelvic ascites. Ascites in the pelvis may be slightly loculated. Musculoskeletal: No acute or suspicious abnormality. IMPRESSION: 1. Severe wall thickening of the colon extending from the splenic flexure to the rectosigmoid colon consistent with colitis of infectious, inflammatory, or ischemic etiology. Findings have worsened since CT 12/07/2019. Large volume of fecal debris within the right and transverse colon, proximal to the colitis changes. No dilated small bowel to suggest bowel obstruction. 2. New abdominopelvic ascites. No free air. Ascites within the pelvis may be slightly loculated. 3.  Thick-walled urinary bladder. Electronically Signed   By: Donavan Foil M.D.   On: 12/10/2019 23:51    Pending Labs Unresulted Labs (From admission, onward)   None      Vitals/Pain Today's Vitals   12/10/19 2144 12/10/19 2200 12/10/19 2256 12/10/19 2300  BP: 125/73 106/89  103/63  Pulse: 82 79  76  Resp: 16 11  13   Temp:      TempSrc:      SpO2: 100% 100%  100%  PainSc:   6      Isolation Precautions No active isolations  Medications Medications  iohexol (OMNIPAQUE) 9 MG/ML oral solution 500 mL (500 mLs Oral Contrast Given 12/10/19 2211)  sodium chloride 0.9 % bolus 1,000 mL (1,000 mLs Intravenous New Bag/Given (Non-Interop) 12/10/19 2143)  acetaminophen (TYLENOL) tablet 1,000 mg (1,000 mg Oral Given 12/10/19 2141)  iohexol (OMNIPAQUE) 300 MG/ML solution 100 mL (100 mLs Intravenous Contrast Given 12/10/19 2309)    Mobility walks

## 2019-12-11 NOTE — H&P (Signed)
Date: 12/11/2019               Patient Name:  Mary Erickson MRN: [id number]  DOB: 1966/08/01 Age / Sex: 54 y.o., female   PCP: Nicola Girt, DO         Medical Service: Internal Medicine Teaching Service         Attending Physician: Dr. Evette Doffing, Mallie Mussel, *    First Contact: Dr. Ronnald Ramp Pager: [phone]  Second Contact: Dr. Koleen Distance Pager: [phone]       After Hours (After 5p/  First Contact Pager: [phone]  weekends / holidays): Second Contact Pager: [phone]   Chief Complaint: Abdominal pain  History of Present Illness:  Ms. Wareing is a 54 year old female with PMH of HTN, T2DM, hypothyroidism, and sjogren's syndrome who presented to [facility] ER for dizziness and abdominal distention. She mentions that after she was discharged, she continued to feel weak with dizziness. States she felt the room darken and was afraid she was going to pass out. She had severe worsening of her abdominal pain which is now radiating to the back without obvious inciting events. Mentions that the pain was initially intermittent but is now constant. She did attempt to eat toast and chicken soup with avocado and was able to tolerate the meal without vomiting. She did have 'waves of nausea.' Also she mentions having constant abdominal cramps and states she had about 3 bowel movements after discharge with very small volume stool but with mucus and blood. She mentions having prior hx of abdominal surgery and states her abdominal pain felt significantly worse than those prior experience. She mentions pain not being alleviated by Tylenol but had significant improvement with fentanyl in the ED.  On ROS, denies any chest pain, palpitations, dyspnea. Mentions some subjective fevers and chills but not states it may be due to her Hashimoto's. Endorsing some chest congestion. Denies any dysuria, cloudy urine or foul smelling odor. Mentions some occasional lower extremity swelling.  Per chart review,  patient was hospitalized from 12/07/19 to 12/09/19 after presenting with severe acute onset diarrhea with CT remarkable for acute colitis. Patient was treated with supportive care and empirically with azithromycin 1000 mg x1. With symptomatic improvement patient was discharged with bentyl and gas-X for persistent abdominal pain/cramping and instructed to followup with outpatient GI doctor.  Meds: * Acetaminophen 650 mg PRN for Abdominal pain * Multivitamin, Probiotics, Vitamin C, Vitamin D, Vitamin B12, Magnesium, Zinc, Omega-3 fatty acids, and Vitamin E supplements * Restasis eye drops * Insulin pump * Levothyroxine 50 mcg daily * Omeprazole 40 mg daily * Zofran 4 mg PRN for nausea * Simethicone 80 mg PRN for gas pain * Dicyclomine 10 mg four times daily  Allergies: Allergies as of 12/10/2019 - Review Complete 12/10/2019  Allergen Reaction Noted  . Doxycycline Other (See Comments) 04/08/2014  . Aspirin Other (See Comments) 02/29/2016  . Ciprofloxacin Hives 04/06/2014  . Nsaids Other (See Comments) 04/10/2016  . Amoxicillin-pot clavulanate Nausea Only 12/30/2017  . Codeine Nausea And Vomiting 01/18/2012  . Hydrocodone-acetaminophen Nausea And Vomiting 01/31/2016  . Oxycodone Nausea And Vomiting 01/31/2016  . Sulfa antibiotics Nausea And Vomiting 01/18/2012  . Sulfasalazine Nausea And Vomiting 01/18/2012   Past Medical History: * Hypertension * T2DM * GERD * Hypothyroidism from Hashimoto's thyroiditis * Sjogrens syndrome - previously on plaquenil (stopped in 2019), uses eyedrops * Anemia * Arthritis * Hepatitis B * Alcohol use disorder - no alcohol since 2006 * High  cholesterol * Internal carotid aneurysm * Melanoma of left upper arm - s/p excision in November 2017 by Dr. Barry Dienes * Migraines * Urinary retention - 2/2 neurogenic bladder * Frequent UTIs - secondary to self-catheterization for above * Blindness in left eye  Family History:  * Father with primary progressive MS.  Brother with type 1 diabetes mellitus. Denies family history of Crohn's disease or ulcerative colitis  Social History:  * Patient moved to [place] in July 2020 from [place], [place] to be closer to medical care * Lives in apartment by herself. Patient reports good support system * Seen by psychologist, Doroteo Bradford, at [facility] once per month * Has not smoked in 10 year, history of smoking * No current alcohol usage  Review of Systems: A complete ROS was negative except as per HPI.  Physical Exam: Blood pressure 106/66, pulse 73, temperature 98.1 F (36.7 C), temperature source Oral, resp. rate 10, SpO2 96 %. Physical Exam  Constitutional: She is well-developed, well-nourished, and in no distress.  HENT:  Head: Normocephalic and atraumatic.  Eyes: EOM are normal. Right eye exhibits no discharge. Left eye exhibits no discharge.  Neck: No tracheal deviation present.  Cardiovascular: Normal rate and regular rhythm. Exam reveals no gallop and no friction rub.  No murmur heard. Pulmonary/Chest: Effort normal and breath sounds normal. No respiratory distress. She has no wheezes. She has no rales.  Abdominal: Soft. She exhibits distension. There is abdominal tenderness (Diffuse tenderness to mild palpation, worse in LLQ). There is no rebound and no guarding.  Musculoskeletal:        General: No tenderness, deformity or edema. Normal range of motion.     Cervical back: Normal range of motion.  Neurological: She is alert. Coordination normal.  Skin: Skin is warm and dry. No rash noted. She is not diaphoretic. No erythema.  Psychiatric: Memory and judgment normal.    EKG: personally reviewed my interpretation is normal sinus rhythm without evidence for acute ischemia, QTc 376  CXR: personally reviewed my interpretation is not performed  CT Abdomen/Pelvis W Contrast (12/10/19): IMPRESSION: 1. Severe wall thickening of the colon extending from the splenic flexure to the rectosigmoid colon  consistent with colitis of infectious, inflammatory, or ischemic etiology. Findings have worsened since CT 12/07/2019. Large volume of fecal debris within the right and transverse colon, proximal to the colitis changes. No dilated small bowel to suggest bowel obstruction. 2. New abdominopelvic ascites. No free air. Ascites within the pelvis may be slightly loculated. 3. Thick-walled urinary bladder.  CBC Latest Ref Rng & Units 12/10/2019 12/09/2019 12/08/2019  WBC 4.0 - 10.5 K/uL 14.6(H) 17.3(H) 14.1(H)  Hemoglobin 12.0 - 15.0 g/dL 11.2(L) 11.2(L) 12.3  Hematocrit 36.0 - 46.0 % 34.0(L) 33.5(L) 36.9  Platelets 150 - 400 K/uL 214 191 218   BMP Latest Ref Rng & Units 12/10/2019 12/09/2019 12/08/2019  Glucose 70 - 99 mg/dL 70 93 101(H)  BUN 6 - 20 mg/dL 10 7 11   Creatinine 0.44 - 1.00 mg/dL 0.77 0.79 0.92  Sodium 135 - 145 mmol/L 141 134(L) 138  Potassium 3.5 - 5.1 mmol/L 3.5 4.1 4.2  Chloride 98 - 111 mmol/L 102 103 104  CO2 22 - 32 mmol/L 28 26 28   Calcium 8.9 - 10.3 mg/dL 8.8(L) 7.9(L) 8.2(L)   Assessment & Plan by Problem: Active Problems:   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)   Type 1 diabetes mellitus ([place])   Hashimoto's disease   Acute colitis   Dizziness  Patient is  a 54 year old female with PMH of  HTN, T2DM, hypothyroidism, sjogren's syndrome, and recent admission for colitis who presented for dizziness and abdominal distention and was found on CT to have worsening colitis.  # Acute colitis: Patient discharged following recent admission for acute colitis. Patient with continued mucous/bloody bowel movements at home that are less frequent. CT abdomen/pelvis reveals worsening of colitis with new ascites. During last admission, GI PCR and C-Diff were negative. At that time patient was treated empirically with azithromycin for infectious colitis. Given patient's autoimmune history, worsening colitis on CT, and continued symptoms, there is concern that symptoms may be secondary  to inflammatory etiology. Patient is hemodynamically stable and with negative orthostatics. * Simethicone 80 mg four times daily PRN * Bentyl 10 mg three times daily  * Pantoprazole 80 mg daily * Acetaminophen 650 mg Q6HR PRN for mild-moderate pain * Fentanyl 25 mcg IV x1 for severe pain, will defer to day team for further PRN pain management * LR bolus 1 L x1 + LR mIVF at 200 cc/hr * Repeat CBC, CMP  # Hypothyroidism: Continue home 50 mcg daily  # T2DM: Continue home insulin pump  # Sjogrens: Continue home restasis eye drops  # Neurogenic bladder: Patient self caths at home. Patient may continue to self-cath   Dispo: Admit patient to Observation with expected length of stay less than 2 midnights.  Signed: Jeanmarie Hubert, MD 12/11/2019, 1:52 AM

## 2019-12-11 NOTE — Anesthesia Preprocedure Evaluation (Deleted)
Anesthesia Evaluation    Reviewed: Allergy & Precautions, H&P , Patient's Chart, lab work & pertinent test results  Airway        Dental   Pulmonary neg pulmonary ROS, former smoker,           Cardiovascular Exercise Tolerance: Good hypertension, negative cardio ROS       Neuro/Psych  Headaches, Seizures -, Well Controlled,  negative neurological ROS  negative psych ROS   GI/Hepatic negative GI ROS, Neg liver ROS, GERD  ,(+) Hepatitis -  Endo/Other  negative endocrine ROSdiabetes, Insulin DependentHypothyroidism   Renal/GU negative Renal ROS  negative genitourinary   Musculoskeletal  (+) Arthritis , Osteoarthritis,    Abdominal   Peds  Hematology negative hematology ROS (+) Blood dyscrasia, anemia ,   Anesthesia Other Findings   Reproductive/Obstetrics negative OB ROS                             Anesthesia Physical Anesthesia Plan  ASA: III  Anesthesia Plan: MAC   Post-op Pain Management:    Induction: Intravenous  PONV Risk Score and Plan: 2 and Propofol infusion and Treatment may vary due to age or medical condition  Airway Management Planned: Simple Face Mask  Additional Equipment:   Intra-op Plan:   Post-operative Plan:   Informed Consent: I have reviewed the patients History and Physical, chart, labs and discussed the procedure including the risks, benefits and alternatives for the proposed anesthesia with the patient or authorized representative who has indicated his/her understanding and acceptance.     Dental advisory given  Plan Discussed with: CRNA  Anesthesia Plan Comments:         Anesthesia Quick Evaluation

## 2019-12-11 NOTE — Progress Notes (Signed)
Pt spoke with GI MD at bedside, pt is not going to have colonoscopy, declining to have. Pt wants to continue on IV solumedrol that medical team ordered this am. GI PA had d/c'd med earlier today.  Pt wants medical MD paged and request medication be resumed, pt feels it has helped her abd pain.  Page placed to after 5pm pager [phone], waiting for call back.

## 2019-12-11 NOTE — Discharge Summary (Signed)
Name: CARSHENA SHAIKH MRN: [id number] DOB: December 30, 1965 54 y.o. PCP: Nicola Girt, DO  Date of Admission: 12/10/2019  5:25 PM Date of Discharge: 12/11/2019 Attending Physician: Lalla Brothers  Discharge Diagnosis: 1. Acute colitis  Discharge Medications: Allergies  Allergen Reactions  . Doxycycline Other (See Comments)    Elevates liver enzymes   . Aspirin Other (See Comments)    Can take 81 mg but has to be coated; No other Aspirin due to bleeding in stools from ulcers  . Ciprofloxacin Hives    Per pt, she can tolerate oral but not IV  . Nsaids Other (See Comments)    Causes kidney and skin problems Kidney and skin problems  . Amoxicillin-Pot Clavulanate Nausea Only  . Codeine Nausea And Vomiting  . Hydrocodone-Acetaminophen Nausea And Vomiting  . Oxycodone Nausea And Vomiting  . Sulfa Antibiotics Nausea And Vomiting  . Sulfasalazine Nausea And Vomiting      TAKE these medications   B-12 2500 MCG Tabs Take 2,500 mcg by mouth daily.   CLEAR EYES OP Place 1 drop into both eyes daily.   cycloSPORINE 0.05 % ophthalmic emulsion Commonly known as: RESTASIS Place 1 drop into both eyes 2 (two) times daily.   D3-1000 25 MCG (1000 UT) capsule Generic drug: Cholecalciferol Take 1,000 Units by mouth daily.   dicyclomine 10 MG capsule Commonly known as: BENTYL Take 1 capsule (10 mg total) by mouth 4 (four) times daily -  before meals and at bedtime for 5 days.   Fish Oil 1200 MG Caps Take 1,200 mg by mouth 2 (two) times daily.   ibuprofen 200 MG tablet Commonly known as: ADVIL Take 200-400 mg by mouth every 6 (six) hours as needed for mild pain.   insulin aspart 100 UNIT/ML injection Commonly known as: novoLOG Inject 50-60 Units into the skin See admin instructions. Insulin pump basil rate 50-60 units throughout day. Additional 0-5 units per bolos.   insulin pump Soln Inject into the skin.   Levemir FlexTouch 100 UNIT/ML Pen Generic drug:  Insulin Detemir Inject 7 Units into the skin 2 (two) times daily as needed (in case pump breaks).   Levothyroxine Sodium 50 MCG Caps Take 50 mcg by mouth daily.   Magnesium 400 MG Caps Take 400 mg by mouth daily.   multivitamins ther. w/minerals Tabs tablet Take 1 tablet by mouth daily.   naproxen sodium 220 MG tablet Commonly known as: ALEVE Take 220 mg by mouth as needed (pain).   omeprazole 40 MG capsule Commonly known as: PRILOSEC Take 40 mg by mouth daily.   ondansetron 4 MG disintegrating tablet Commonly known as: Zofran ODT Take 1 tablet (4 mg total) by mouth every 8 (eight) hours as needed for nausea or vomiting.   predniSONE 20 MG tablet Commonly known as: DELTASONE Take 2 tablets (40 mg total) by mouth daily with breakfast. For the next four days   prochlorperazine 10 MG tablet Commonly known as: COMPAZINE Take 1 tablet (10 mg total) by mouth 2 (two) times daily as needed for nausea or vomiting.   promethazine 25 MG tablet Commonly known as: PHENERGAN Take 1 tablet (25 mg total) by mouth every 6 (six) hours as needed for nausea or vomiting.   simethicone 80 MG chewable tablet Commonly known as: MYLICON Chew 1 tablet (80 mg total) by mouth 4 (four) times daily as needed for up to 5 days for flatulence (abdominal gas pain).   traMADol 50 MG tablet Commonly known as: ULTRAM Take  1-2 tablets (50-100 mg total) by mouth every 6 (six) hours as needed for moderate pain or severe pain.   vitamin C 1000 MG tablet Take 1,000 mg by mouth daily.   vitamin E 180 MG (400 UNITS) capsule Generic drug: vitamin E Take 400 Units by mouth daily.   Wh Petrol-Mineral Oil-Lanolin 0.1-0.1 % Oint Apply 1 application to eye every evening.   Zinc 50 MG Tabs Take 50 mg by mouth daily.     Disposition and follow-up:   Ms.Teylor R Hollenberg left AGAINST MEDICAL ADVICE from [facility]. At the hospital follow up visit, please address:  1.  Acute  colitis: Please ensure she has f/u with GI  2.  Labs / imaging needed at time of follow-up: cbc, cmp  3.  Pending labs/ test needing follow-up: N/A  Hospital Course by problem list: 1. Acute Colitis: Mrs.Hankinson is a 54 year old female with PMH of  HTN, T2DM, hypothyroidism, sjogren's syndrome, and recent admission for colitis presenting with abdominal pain. CT abd/pelvis showed worsening bowel wall thickening compared to prior imaging and she was admitted for work-up. GI was consulted who recommended colonoscopy for further work-up for possible inflammatory disease. However, she stated she did not want to have her colonoscopy done and left the hospital [place].  Discharge Vitals:   BP 117/71 (BP Location: Left Arm)   Pulse 71   Temp 98.5 F (36.9 C) (Oral)   Resp 16   Ht 5' 8.5" (1.74 m)   Wt 65.9 kg   SpO2 96%   BMI 21.77 kg/m   Pertinent Labs, Studies, and Procedures:   CT ABDOMEN AND PELVIS WITH CONTRAST FINDINGS: Lower chest: Linear densities in the right middle lobe, lingula and bases felt most consistent with atelectasis. Normal heart size.  Hepatobiliary: 3.6 cm hypodense lesion in the right hepatic lobe, most likely hemangioma. Probably stable allowing for differences in measurement technique. Status post cholecystectomy. No biliary dilatation  Pancreas: Unremarkable. No pancreatic ductal dilatation or surrounding inflammatory changes.  Spleen: Normal in size without focal abnormality.  Adrenals/Urinary Tract: Adrenal glands are normal. Kidneys show no hydronephrosis. Slightly thick-walled urinary bladder  Stomach/Bowel: Stomach is nonenlarged. No dilated small bowel. Large volume of stool within the right and transverse colon. Severe wall thickening extending from splenic flexure to the rectosigmoid colon, appears worse compared with 12/07/2019. Negative appendix.  Vascular/Lymphatic: Mild aortic atherosclerosis. No aneurysm. Subcentimeter  retroperitoneal lymph nodes.  Reproductive: No adnexal masses.  Multiple clips in the pelvis.  Other: No free air. Small amount of abdominopelvic ascites. Ascites in the pelvis may be slightly loculated.  Musculoskeletal: No acute or suspicious abnormality.  IMPRESSION: 1. Severe wall thickening of the colon extending from the splenic flexure to the rectosigmoid colon consistent with colitis of infectious, inflammatory, or ischemic etiology. Findings have worsened since CT 12/07/2019. Large volume of fecal debris within the right and transverse colon, proximal to the colitis changes. No dilated small bowel to suggest bowel obstruction. 2. New abdominopelvic ascites. No free air. Ascites within the pelvis may be slightly loculated. 3. Thick-walled urinary bladder.  CBC Latest Ref Rng & Units 12/11/2019 12/10/2019 12/09/2019  WBC 4.0 - 10.5 K/uL 11.9(H) 14.6(H) 17.3(H)  Hemoglobin 12.0 - 15.0 g/dL 10.3(L) 11.2(L) 11.2(L)  Hematocrit 36.0 - 46.0 % 30.6(L) 34.0(L) 33.5(L)  Platelets 150 - 400 K/uL 231 214 191   CMP Latest Ref Rng & Units 12/11/2019 12/10/2019 12/09/2019  Glucose 70 - 99 mg/dL 75 70 93  BUN 6 -  20 mg/dL 6 10 7   Creatinine 0.44 - 1.00 mg/dL 0.80 0.77 0.79  Sodium 135 - 145 mmol/L 139 141 134(L)  Potassium 3.5 - 5.1 mmol/L 3.7 3.5 4.1  Chloride 98 - 111 mmol/L 105 102 103  CO2 22 - 32 mmol/L 26 28 26   Calcium 8.9 - 10.3 mg/dL 8.3(L) 8.8(L) 7.9(L)  Total Protein 6.5 - 8.1 g/dL 5.0(L) 5.9(L) -  Total Bilirubin 0.3 - 1.2 mg/dL 0.6 0.6 -  Alkaline Phos 38 - 126 U/L 107 76 -  AST 15 - 41 U/L 36 19 -  ALT 0 - 44 U/L 28 21 -   Signed: Mosetta Anis, MD 12/11/2019, 10:02 PM   Pager: [phone]

## 2019-12-11 NOTE — Progress Notes (Addendum)
Dr Koleen Distance came up to pt room and talked with her at length about her care and concerns. At approx [id number], pt stepped out into the hall, inquired if the doctor had put orders in yet. She stated the doctor had said she had to speak with another doctor first then would decide. Instructed her that nurse would look for the order, and oncoming nurse to get report shortly.  At approx 1925, pt came out of room fully dressed in home clothes, coat on and her belongings and practically ran up the hall, stating "I'm leaving, you're not doing anything for me anyway", not able to stop pt or talk with her, as she would not stop to listen to discuss her needs. Pt has been upset off and on all day and very head strong (as she refused to take any meds this morning until she got more in and out cath kits at her bedside first).  Staff has been able to meet her needs all day, and doctors have spoke with her at length through out the day.Unable to have pt sign an AMA form, as she would not stop to even talk to staff about what was going on that she felt she had to leave.  Pt stated she had removed her IV site when the nurse asked her is she had taken it out, pt had her coat on and site was not visible.   At [place], spoke with Dr Truman Hayward and informed him of her leaving AMA.

## 2019-12-12 LAB — CULTURE, BLOOD (ROUTINE X 2)
Culture: NO GROWTH
Culture: NO GROWTH
Special Requests: ADEQUATE
Special Requests: ADEQUATE

## 2019-12-12 SURGERY — COLONOSCOPY WITH PROPOFOL
Anesthesia: Monitor Anesthesia Care

## 2019-12-14 NOTE — Progress Notes (Signed)
Patient's pocketbook and medications returned to patient and signed for. All items accounted for. Dorthey Sawyer, RN

## 2019-12-17 ENCOUNTER — Ambulatory Visit (HOSPITAL_COMMUNITY)
Admission: EM | Admit: 2019-12-17 | Discharge: 2019-12-17 | Disposition: A | Discharge status: Home / Self Care | Payer: Medicaid Other | Attending: Physician Assistant | Admitting: Physician Assistant

## 2019-12-17 ENCOUNTER — Other Ambulatory Visit: Payer: Self-pay

## 2019-12-17 ENCOUNTER — Encounter (HOSPITAL_COMMUNITY): Payer: Self-pay | Admitting: Emergency Medicine

## 2019-12-17 DIAGNOSIS — I808 Phlebitis and thrombophlebitis of other sites: Secondary | ICD-10-CM | POA: Diagnosis not present

## 2019-12-17 MED ORDER — DICLOFENAC SODIUM 1 % EX GEL: 2.0000 g | Freq: Four times a day (QID) | CUTANEOUS | 0 refills | Status: DC

## 2019-12-17 NOTE — Discharge Instructions (Addendum)
Apply the diclofenac gel to the painful areas 3-4 times a day  Apply warm compress to the areas  Use compression wraps/ace wraps to lightly apply pressure  Change wrists for your watch  Follow up with your primary care if not improving  If swelling worsens, you experience upper arm swelling or worsening pain, return to this clinic or go to the Emergency department to be evaluated

## 2019-12-17 NOTE — ED Triage Notes (Signed)
Pt here for swollen area to left forearm after being hospitalized recently with some redness and pain

## 2019-12-17 NOTE — ED Provider Notes (Signed)
[facility]    CSN: [id number] Arrival date & time: 12/17/19  1520      History   Chief Complaint Chief Complaint  Patient presents with  . arm swelling    HPI Mary Erickson is a 54 y.o. female.   Patient reports to urgent care today for swollen and tender veins on her left arm. She reports painful and hard veins since her discharge from the hospital on 1/28. Yesterday however she noticed an area of redness and swelling over one of these veins on her left forearm. This redness and swelling has gotten a bit worse since yesterday and she was concerned. She has had no swelling else where. She does believe some of them have improved with regular to pain and hardness. Her right arm has no issues. Denies chest pain or shortness of breath.   She believes these are related to the IVs placed during her visit. She has never had this issue before.   She has a history of sjogrens, DMI and hashimotos and was in the hospital for GI bleed related to colitis. No history of blood clots.     Past Medical History:  Diagnosis Date  . Acid reflux   . Anemia   . Arthritis    "elbows, hands, neck, shoulders" (10/02/2016)  . Calcium blood increased   . Chronic neck pain   . Chronic UTI (urinary tract infection)    "from self caths" (10/02/2016)  . Hepatitis B    acute hepatitis B from lancet  . History of alcoholism ([place])    10/02/2016 "sober since 03/14/2005"  . History of blood transfusion 1984; 1989; 2008   "quite a few"  . History of shortness of breath   . Hypercholesteremia   . Hypertension    pt. states that she has only had low blood pressure  . Hypothyroidism   . Internal carotid aneurysm dx'd early 2017  . Legally blind in left eye, as defined in [place]   . Melanoma ([place])    left upper arm  . Migraines    "quite a few in a month; at least 15" (10/02/2016)  . Neuropathy    neck; "not sure if it's from DM or Sjogren's" (10/02/2016)  . Pneumonia 1986   "1 yr after  major OR when I was under anesthesia for 13 hr"  . Seizures ([place]) ~ 2004; ~ 2013   "seizures from diabetes "  . Self-catheterizes urinary bladder    "since OR in 1984" (10/02/2016)  . Sjogren's syndrome ([place])   . Thyroid goiter    "still present" (10/02/2016)  . Type II diabetes mellitus ([place])   . Urinary retention    self caths  . Wears glasses     Patient Active Problem List   Diagnosis Date Noted  . Dizziness 12/11/2019  . Colitis 12/11/2019  . Abnormal CT scan, colon   . Acute colitis 12/08/2019  . Orthostatic hypotension 12/08/2019  . Type 1 diabetes mellitus ([place]) 06/30/2019  . Hashimoto's disease 06/30/2019  . Sjogren's syndrome ([place]) 06/30/2019  . Hx of Hypertension 06/30/2019  . Neurogenic bladder 06/30/2019  . Malignant melanoma of left upper arm ([place]) 10/02/2016    Past Surgical History:  Procedure Laterality Date  . ABDOMINAL HYSTERECTOMY  2008  . ECTOPIC PREGNANCY SURGERY  1989  . EXCISION MELANOMA WITH SENTINEL LYMPH NODE BIOPSY Left 10/02/2016   Procedure: WIDE LOCAL EXCISION AND ADVANCEMENT FLAP CLOSURE LEFT UPPER  MELANOMA WITH SENTINEL LYMPH NODE MAPPING AND BIOPSY;  Surgeon: [name], MD;  Location: [place];  Service: General;  Laterality: Left;  . EYE SURGERY    . LAPAROSCOPIC CHOLECYSTECTOMY  ~ 01/2016  . MELANOMA EXCISION WITH SENTINEL LYMPH NODE BIOPSY Left 10/02/2016   WIDE LOCAL EXCISION AND ADVANCEMENT FLAP CLOSURE LEFT UPPER  MELANOMA WITH SENTINEL LYMPH NODE MAPPING AND BIOPSY  . SHOULDER ARTHROSCOPY W/ ROTATOR CUFF REPAIR Right 2014   "partial"  . STRABISMUS SURGERY Left 1971  . TUMOR EXCISION  1984   from tail bone and part of tail bone removed    OB History   No obstetric history on file.      Home Medications    Prior to Admission medications   Medication Sig Start Date End Date Taking? Authorizing Provider  acetaminophen (TYLENOL) 325 MG tablet Take 650 mg by mouth every 6 (six) hours as needed for mild pain.    [provider]  Ascorbic Acid (VITAMIN C) 1000 MG tablet Take 1,000 mg by mouth daily.    [provider]  Cholecalciferol (D3-1000) 25 MCG (1000 UT) capsule Take 1,000 Units by mouth daily.     [provider]  Cyanocobalamin (B-12) 2500 MCG TABS Take 2,500 mcg by mouth daily.     [provider]  cycloSPORINE (RESTASIS) 0.05 % ophthalmic emulsion Place 1 drop into both eyes 2 (two) times daily.    [provider]  diclofenac Sodium (VOLTAREN) 1 % GEL Apply 2 g topically 4 (four) times daily. 12/17/19   [name], Marguerita Beards, PA-C  dicyclomine (BENTYL) 10 MG capsule Take 1 capsule (10 mg total) by mouth 4 (four) times daily -  before meals and at bedtime for 5 days. 12/09/19 12/14/19  Ladona Horns, MD  ibuprofen (ADVIL) 200 MG tablet Take 200-400 mg by mouth every 6 (six) hours as needed for mild pain.    [provider]  insulin aspart (NOVOLOG) 100 UNIT/ML injection Inject 50-60 Units into the skin See admin instructions. Insulin pump basil rate 50-60 units throughout day. Additional 0-5 units per bolos.    [provider]  Insulin Detemir (LEVEMIR FLEXTOUCH) 100 UNIT/ML Pen Inject 4 Units into the skin 2 (two) times daily as needed (in case pump breaks).  09/27/17   [provider]  Insulin Human (INSULIN PUMP) SOLN Inject into the skin.    [provider]  Levothyroxine Sodium 50 MCG CAPS Take 50 mcg by mouth daily. 11/10/19   [provider]  Magnesium 400 MG CAPS Take 400 mg by mouth daily.    [provider]  Multiple Vitamins-Minerals (MULTIVITAMINS THER. W/MINERALS) TABS Take 1 tablet by mouth daily.    [provider]  Naphazoline HCl (CLEAR EYES OP) Place 1 drop into both eyes daily.    [provider]  naproxen sodium (ALEVE) 220 MG tablet Take 220 mg by mouth as needed (pain).    [provider]  Omega-3 Fatty Acids (FISH OIL) 1200 MG CAPS Take 1,200 mg by mouth 2 (two) times daily.      [provider]  omeprazole (PRILOSEC) 40 MG capsule Take 40 mg by mouth daily. 05/04/19   [provider]  ondansetron (ZOFRAN ODT) 4 MG disintegrating tablet Take 1 tablet (4 mg total) by mouth every 8 (eight) hours as needed for nausea or vomiting. 12/07/19   Sherwood Gambler, MD  predniSONE (DELTASONE) 20 MG tablet Take 2 tablets (40 mg total) by mouth daily with breakfast. For the next four days Patient not taking:  Reported on 12/07/2019 10/10/19   Jacqlyn Larsen, PA-C  prochlorperazine (COMPAZINE) 10 MG tablet Take 1 tablet (10 mg total) by mouth 2 (two) times daily as needed for nausea or vomiting. 10/10/19   Jacqlyn Larsen, PA-C  promethazine (PHENERGAN) 25 MG tablet Take 1 tablet (25 mg total) by mouth every 6 (six) hours as needed for nausea or vomiting. 12/07/19   Sherwood Gambler, MD  simethicone (MYLICON) 80 MG chewable tablet Chew 1 tablet (80 mg total) by mouth 4 (four) times daily as needed for up to 5 days for flatulence (abdominal gas pain). 12/09/19 12/14/19  Ladona Horns, MD  traMADol (ULTRAM) 50 MG tablet Take 1-2 tablets (50-100 mg total) by mouth every 6 (six) hours as needed for moderate pain or severe pain. Patient not taking: Reported on 10/10/2019 10/02/16   [name], MD  vitamin E (VITAMIN E) 400 UNIT capsule Take 400 Units by mouth daily.    [provider]  White Petrolatum-Mineral Oil [facility] PETROL-MINERAL OIL-LANOLIN) 0.1-0.1 % OINT Apply 1 application to eye every evening.    [provider]  Zinc 50 MG TABS Take 50 mg by mouth daily.     [provider]    Family History History reviewed. No pertinent family history.  Social History Social History   Tobacco Use  . Smoking status: Former Smoker    Packs/day: 1.50    Years: 24.00    Pack years: 36.00    Types: Cigarettes    Quit date: 2008    Years since quitting: 13.1  . Smokeless tobacco: Never Used  Substance Use Topics  . Alcohol use: No    Comment: 10/02/2016  "sober since 03/14/2005"  . Drug use: Yes    Types: Marijuana    Comment: 10/02/2016 " occasional; nothing since before 2006"     Allergies   Doxycycline, Aspirin, Ciprofloxacin, Nsaids, Amoxicillin-pot clavulanate, Codeine, Hydrocodone-acetaminophen, Oxycodone, Sulfa antibiotics, and Sulfasalazine   Review of Systems Review of Systems  Constitutional: Negative for chills and fever.  Respiratory: Negative for cough, chest tightness and shortness of breath.   Cardiovascular: Negative for chest pain, palpitations and leg swelling.  Musculoskeletal: Negative for arthralgias, back pain, joint swelling and myalgias.  Skin: Positive for color change and rash.  Neurological: Negative for dizziness, seizures, syncope, light-headedness, numbness and headaches.  Hematological: Negative for adenopathy. Does not bruise/bleed easily.  All other systems reviewed and are negative.    Physical Exam Triage Vital Signs ED Triage Vitals  Enc Vitals Group     BP 12/17/19 1615 128/81     Pulse Rate 12/17/19 1615 73     Resp 12/17/19 1615 18     Temp 12/17/19 1615 98.4 F (36.9 C)     Temp Source 12/17/19 1615 Oral     SpO2 12/17/19 1615 100 %     Weight --      Height --      Head Circumference --      Peak Flow --      Pain Score 12/17/19 1616 4     Pain Loc --      Pain Edu? --      Excl. in [place]? --    No data found.  Updated Vital Signs BP 128/81 (BP Location: Right Arm)   Pulse 73   Temp 98.4 F (36.9 C) (Oral)   Resp 18   SpO2 100%   Visual Acuity Right Eye Distance:   Left Eye Distance:   Bilateral Distance:  Right Eye Near:   Left Eye Near:    Bilateral Near:     Physical Exam Vitals and nursing note reviewed.  Constitutional:      General: She is not in acute distress.    Appearance: Normal appearance. She is well-developed. She is not ill-appearing.  HENT:     Head: Normocephalic and atraumatic.  Eyes:     General: No scleral icterus.    Conjunctiva/sclera:  Conjunctivae normal.     Pupils: Pupils are equal, round, and reactive to light.  Cardiovascular:     Rate and Rhythm: Normal rate.  Pulmonary:     Effort: Pulmonary effort is normal. No respiratory distress.  Musculoskeletal:     Cervical back: Normal range of motion and neck supple.     Right lower leg: No edema.     Left lower leg: No edema.  Skin:    General: Skin is warm and dry.     Comments: Left arm with hard rope like superficial veins. On forearm with erythema and TTP with rope like feeling. No distal or proximal swelling .  Neurological:     General: No focal deficit present.     Mental Status: She is alert and oriented to person, place, and time.  Psychiatric:        Mood and Affect: Mood normal.        Behavior: Behavior normal.        Thought Content: Thought content normal.        Judgment: Judgment normal.      UC Treatments / Results  Labs (all labs ordered are listed, but only abnormal results are displayed) Labs Reviewed - No data to display  EKG   Radiology No results found.  Procedures Procedures (including critical care time)  Medications Ordered in UC Medications - No data to display  Initial Impression / Assessment and Plan / UC Course  I have reviewed the triage vital signs and the nursing notes.  Pertinent labs & imaging results that were available during my care of the patient were reviewed by me and considered in my medical decision making (see chart for details).     #Superficial Thrombophlebitis - given history of IV catheretization in that arm, likely 2/2 to that.  - conservative treatment with warm compress, compression and due to history of poor tolerance to NSAID and recent GI issues, will use topical voltaren for anit-inflammatory. - Strict follow up and ED precautions discussed   Final Clinical Impressions(s) / UC Diagnoses   Final diagnoses:  Superficial thrombophlebitis of left upper extremity     Discharge  Instructions     Apply the diclofenac gel to the painful areas 3-4 times a day  Apply warm compress to the areas  Use compression wraps/ace wraps to lightly apply pressure  Change wrists for your watch  Follow up with your primary care if not improving  If swelling worsens, you experience upper arm swelling or worsening pain, return to this clinic or go to the Emergency department to be evaluated        ED Prescriptions    Medication Sig Dispense Auth. Provider   diclofenac Sodium (VOLTAREN) 1 % GEL Apply 2 g topically 4 (four) times daily. 50 g Kamaree Berkel, Marguerita Beards, PA-C     PDMP not reviewed this encounter.   Purnell Shoemaker, PA-C 12/17/19 1920

## 2020-03-03 ENCOUNTER — Ambulatory Visit: Payer: Self-pay | Admitting: Ophthalmology

## 2020-03-10 ENCOUNTER — Encounter (HOSPITAL_BASED_OUTPATIENT_CLINIC_OR_DEPARTMENT_OTHER): Payer: Self-pay | Admitting: Ophthalmology

## 2020-03-10 ENCOUNTER — Other Ambulatory Visit: Payer: Self-pay

## 2020-03-15 ENCOUNTER — Other Ambulatory Visit (HOSPITAL_COMMUNITY): Payer: Medicaid Other

## 2020-03-18 ENCOUNTER — Encounter (HOSPITAL_BASED_OUTPATIENT_CLINIC_OR_DEPARTMENT_OTHER)
Admission: RE | Disposition: A | Discharge status: Home / Self Care | Payer: Self-pay | Source: Home / Self Care | Attending: Ophthalmology

## 2020-03-18 ENCOUNTER — Ambulatory Visit (HOSPITAL_BASED_OUTPATIENT_CLINIC_OR_DEPARTMENT_OTHER)
Admission: RE | Admit: 2020-03-18 | Discharge: 2020-03-18 | Disposition: A | Discharge status: Home / Self Care | Payer: Medicaid Other | Attending: Ophthalmology | Admitting: Ophthalmology

## 2020-03-18 ENCOUNTER — Ambulatory Visit: Payer: Self-pay | Admitting: Ophthalmology

## 2020-03-18 ENCOUNTER — Encounter (HOSPITAL_BASED_OUTPATIENT_CLINIC_OR_DEPARTMENT_OTHER): Payer: Self-pay | Admitting: Ophthalmology

## 2020-03-18 ENCOUNTER — Other Ambulatory Visit (HOSPITAL_COMMUNITY)
Admission: RE | Admit: 2020-03-18 | Discharge: 2020-03-18 | Disposition: A | Discharge status: Home / Self Care | Payer: Medicaid Other | Source: Ambulatory Visit | Attending: Ophthalmology | Admitting: Ophthalmology

## 2020-03-18 ENCOUNTER — Ambulatory Visit (HOSPITAL_BASED_OUTPATIENT_CLINIC_OR_DEPARTMENT_OTHER): Payer: Medicaid Other | Admitting: Anesthesiology

## 2020-03-18 ENCOUNTER — Other Ambulatory Visit: Payer: Self-pay

## 2020-03-18 DIAGNOSIS — K219 Gastro-esophageal reflux disease without esophagitis: Secondary | ICD-10-CM | POA: Insufficient documentation

## 2020-03-18 DIAGNOSIS — E7800 Pure hypercholesterolemia, unspecified: Secondary | ICD-10-CM | POA: Diagnosis not present

## 2020-03-18 DIAGNOSIS — E039 Hypothyroidism, unspecified: Secondary | ICD-10-CM | POA: Insufficient documentation

## 2020-03-18 DIAGNOSIS — M3500 Sicca syndrome, unspecified: Secondary | ICD-10-CM | POA: Insufficient documentation

## 2020-03-18 DIAGNOSIS — M1990 Unspecified osteoarthritis, unspecified site: Secondary | ICD-10-CM | POA: Insufficient documentation

## 2020-03-18 DIAGNOSIS — E1040 Type 1 diabetes mellitus with diabetic neuropathy, unspecified: Secondary | ICD-10-CM | POA: Insufficient documentation

## 2020-03-18 DIAGNOSIS — Z85820 Personal history of malignant melanoma of skin: Secondary | ICD-10-CM | POA: Insufficient documentation

## 2020-03-18 DIAGNOSIS — Z794 Long term (current) use of insulin: Secondary | ICD-10-CM | POA: Diagnosis not present

## 2020-03-18 DIAGNOSIS — Z79890 Hormone replacement therapy (postmenopausal): Secondary | ICD-10-CM | POA: Diagnosis not present

## 2020-03-18 DIAGNOSIS — H5010 Unspecified exotropia: Secondary | ICD-10-CM | POA: Diagnosis not present

## 2020-03-18 DIAGNOSIS — Z87891 Personal history of nicotine dependence: Secondary | ICD-10-CM | POA: Insufficient documentation

## 2020-03-18 DIAGNOSIS — Z79899 Other long term (current) drug therapy: Secondary | ICD-10-CM | POA: Insufficient documentation

## 2020-03-18 DIAGNOSIS — H5200 Hypermetropia, unspecified eye: Secondary | ICD-10-CM | POA: Diagnosis not present

## 2020-03-18 DIAGNOSIS — I10 Essential (primary) hypertension: Secondary | ICD-10-CM | POA: Insufficient documentation

## 2020-03-18 DIAGNOSIS — Z20822 Contact with and (suspected) exposure to covid-19: Secondary | ICD-10-CM | POA: Insufficient documentation

## 2020-03-18 HISTORY — PX: STRABISMUS SURGERY: SHX218

## 2020-03-18 LAB — RESPIRATORY PANEL BY RT PCR (FLU A&B, COVID)
Influenza A by PCR: NEGATIVE
Influenza B by PCR: NEGATIVE
SARS Coronavirus 2 by RT PCR: NEGATIVE

## 2020-03-18 LAB — BASIC METABOLIC PANEL
Anion gap: 5 (ref 5–15)
BUN: 14 mg/dL (ref 6–20)
CO2: 28 mmol/L (ref 22–32)
Calcium: 8.9 mg/dL (ref 8.9–10.3)
Chloride: 106 mmol/L (ref 98–111)
Creatinine, Ser: 0.88 mg/dL (ref 0.44–1.00)
GFR calc Af Amer: >60 mL/min (ref >60)
GFR calc non Af Amer: >60 mL/min (ref >60)
Glucose, Bld: 167 mg/dL — ABNORMAL HIGH (ref 70–99)
Potassium: 4.2 mmol/L (ref 3.5–5.1)
Sodium: 139 mmol/L (ref 135–145)

## 2020-03-18 LAB — GLUCOSE, CAPILLARY
Glucose-Capillary: 155 mg/dL — ABNORMAL HIGH (ref 70–99)
Glucose-Capillary: 120 mg/dL — ABNORMAL HIGH (ref 70–99)

## 2020-03-18 SURGERY — REPAIR STRABISMUS
Anesthesia: General | Site: Eye | Laterality: Left

## 2020-03-18 MED ORDER — ONDANSETRON 4 MG PO TBDP
ORAL_TABLET | ORAL | Status: AC
  Filled 2020-03-18: qty 1

## 2020-03-18 MED ORDER — KETOROLAC TROMETHAMINE 30 MG/ML IJ SOLN
INTRAMUSCULAR | Status: AC
  Filled 2020-03-18: qty 1

## 2020-03-18 MED ORDER — HYDROMORPHONE HCL 1 MG/ML IJ SOLN
0.2500 mg | INTRAMUSCULAR | Status: DC | PRN
  Administered 2020-03-18 (×2): 0.5 mg via INTRAVENOUS

## 2020-03-18 MED ORDER — IBUPROFEN 200 MG PO TABS
ORAL_TABLET | ORAL | Status: AC
  Filled 2020-03-18: qty 4

## 2020-03-18 MED ORDER — ONDANSETRON HCL 4 MG/2ML IJ SOLN
INTRAMUSCULAR | Status: DC | PRN
  Administered 2020-03-18: 4 mg via INTRAVENOUS

## 2020-03-18 MED ORDER — LIDOCAINE 2% (20 MG/ML) 5 ML SYRINGE
INTRAMUSCULAR | Status: AC
  Filled 2020-03-18: qty 5

## 2020-03-18 MED ORDER — LACTATED RINGERS IV SOLN: INTRAVENOUS | Status: DC

## 2020-03-18 MED ORDER — PROMETHAZINE HCL 25 MG/ML IJ SOLN: 6.2500 mg | INTRAMUSCULAR | Status: DC | PRN

## 2020-03-18 MED ORDER — IBUPROFEN 600 MG PO TABS
800.0000 mg | ORAL_TABLET | Freq: Once | ORAL | Status: AC
  Administered 2020-03-18: 800 mg via ORAL

## 2020-03-18 MED ORDER — FENTANYL CITRATE (PF) 100 MCG/2ML IJ SOLN
INTRAMUSCULAR | Status: AC
  Filled 2020-03-18: qty 2

## 2020-03-18 MED ORDER — FENTANYL CITRATE (PF) 100 MCG/2ML IJ SOLN
50.0000 ug | INTRAMUSCULAR | Status: DC | PRN
  Administered 2020-03-18: 100 ug via INTRAVENOUS

## 2020-03-18 MED ORDER — LIDOCAINE 2% (20 MG/ML) 5 ML SYRINGE
INTRAMUSCULAR | Status: DC | PRN
  Administered 2020-03-18: 50 mg via INTRAVENOUS

## 2020-03-18 MED ORDER — TOBRAMYCIN-DEXAMETHASONE 0.3-0.1 % OP OINT
TOPICAL_OINTMENT | OPHTHALMIC | Status: DC | PRN
  Administered 2020-03-18: 1 via OPHTHALMIC

## 2020-03-18 MED ORDER — TOBRAMYCIN-DEXAMETHASONE 0.3-0.1 % OP OINT
TOPICAL_OINTMENT | OPHTHALMIC | Status: AC
  Filled 2020-03-18: qty 3.5

## 2020-03-18 MED ORDER — MIDAZOLAM HCL 2 MG/2ML IJ SOLN
1.0000 mg | INTRAMUSCULAR | Status: DC | PRN
  Administered 2020-03-18: 09:00:00 2 mg via INTRAVENOUS

## 2020-03-18 MED ORDER — ONDANSETRON HCL 4 MG/2ML IJ SOLN
INTRAMUSCULAR | Status: AC
  Filled 2020-03-18: qty 2

## 2020-03-18 MED ORDER — HYDROMORPHONE HCL 1 MG/ML IJ SOLN
INTRAMUSCULAR | Status: AC
  Filled 2020-03-18: qty 0.5

## 2020-03-18 MED ORDER — MEPERIDINE HCL 25 MG/ML IJ SOLN: 6.2500 mg | INTRAMUSCULAR | Status: DC | PRN

## 2020-03-18 MED ORDER — PHENYLEPHRINE 40 MCG/ML (10ML) SYRINGE FOR IV PUSH (FOR BLOOD PRESSURE SUPPORT)
PREFILLED_SYRINGE | INTRAVENOUS | Status: AC
  Filled 2020-03-18: qty 10

## 2020-03-18 MED ORDER — DEXAMETHASONE SODIUM PHOSPHATE 10 MG/ML IJ SOLN
INTRAMUSCULAR | Status: AC
  Filled 2020-03-18: qty 1

## 2020-03-18 MED ORDER — DEXAMETHASONE SODIUM PHOSPHATE 4 MG/ML IJ SOLN
INTRAMUSCULAR | Status: DC | PRN
  Administered 2020-03-18: 5 mg via INTRAVENOUS

## 2020-03-18 MED ORDER — EPHEDRINE 5 MG/ML INJ
INTRAVENOUS | Status: AC
  Filled 2020-03-18: qty 10

## 2020-03-18 MED ORDER — EPHEDRINE SULFATE 50 MG/ML IJ SOLN
INTRAMUSCULAR | Status: DC | PRN
  Administered 2020-03-18: 10 mg via INTRAVENOUS

## 2020-03-18 MED ORDER — MIDAZOLAM HCL 2 MG/2ML IJ SOLN
INTRAMUSCULAR | Status: AC
  Filled 2020-03-18: qty 2

## 2020-03-18 MED ORDER — ONDANSETRON 4 MG PO TBDP
4.0000 mg | ORAL_TABLET | Freq: Once | ORAL | Status: AC
  Administered 2020-03-18: 4 mg via ORAL

## 2020-03-18 MED ORDER — SUCCINYLCHOLINE CHLORIDE 200 MG/10ML IV SOSY
PREFILLED_SYRINGE | INTRAVENOUS | Status: AC
  Filled 2020-03-18: qty 10

## 2020-03-18 SURGICAL SUPPLY — 35 items
APL SRG 3 HI ABS STRL LF PLS (MISCELLANEOUS) ×1
APL SWBSTK 6 STRL LF DISP (MISCELLANEOUS) ×4
APPLICATOR COTTON TIP 6 STRL (MISCELLANEOUS) ×4 IMPLANT
APPLICATOR COTTON TIP 6IN STRL (MISCELLANEOUS) ×12 IMPLANT
APPLICATOR DR MATTHEWS STRL (MISCELLANEOUS) ×3 IMPLANT
BNDG EYE OVAL (GAUZE/BANDAGES/DRESSINGS) ×4 IMPLANT
CAUTERY EYE LOW TEMP 1300F FIN (OPHTHALMIC RELATED) IMPLANT
CLOSURE WOUND 1/4X4 (GAUZE/BANDAGES/DRESSINGS) ×1
COVER BACK TABLE 60X90IN (DRAPES) ×3 IMPLANT
COVER MAYO STAND STRL (DRAPES) ×3 IMPLANT
COVER WAND RF STERILE (DRAPES) IMPLANT
DRAPE SURG 17X23 STRL (DRAPES) ×6 IMPLANT
DRAPE U-SHAPE 76X120 STRL (DRAPES) ×2 IMPLANT
GLOVE BIO SURGEON STRL SZ 6.5 (GLOVE) ×4 IMPLANT
GLOVE BIO SURGEONS STRL SZ 6.5 (GLOVE) ×2
GLOVE BIOGEL M STRL SZ7.5 (GLOVE) ×3 IMPLANT
GOWN STRL REUS W/ TWL LRG LVL3 (GOWN DISPOSABLE) ×1 IMPLANT
GOWN STRL REUS W/TWL LRG LVL3 (GOWN DISPOSABLE) ×3
GOWN STRL REUS W/TWL XL LVL3 (GOWN DISPOSABLE) ×3 IMPLANT
NS IRRIG 1000ML POUR BTL (IV SOLUTION) ×3 IMPLANT
SET BASIN DAY SURGERY F.S. (CUSTOM PROCEDURE TRAY) ×3 IMPLANT
SHEET MEDIUM DRAPE 40X70 STRL (DRAPES) IMPLANT
SPEAR EYE SURG WECK-CEL (MISCELLANEOUS) ×8 IMPLANT
STRIP CLOSURE SKIN 1/4X4 (GAUZE/BANDAGES/DRESSINGS) ×1 IMPLANT
SUT 6 0 SILK T G140 8DA (SUTURE) ×2 IMPLANT
SUT MERSILENE 6-0 18IN S14 8MM (SUTURE)
SUT PLAIN 6 0 TG1408 (SUTURE) ×2 IMPLANT
SUT SILK 4 0 C 3 735G (SUTURE) IMPLANT
SUT VICRYL 6 0 S 28 (SUTURE) ×2 IMPLANT
SUT VICRYL ABS 6-0 S29 18IN (SUTURE) ×2 IMPLANT
SUTURE MERSLN 6-0 18IN S14 8MM (SUTURE) IMPLANT
SYR 10ML LL (SYRINGE) ×3 IMPLANT
SYR TB 1ML LL NO SAFETY (SYRINGE) ×3 IMPLANT
TOWEL GREEN STERILE FF (TOWEL DISPOSABLE) ×3 IMPLANT
TRAY DSU PREP LF (CUSTOM PROCEDURE TRAY) ×3 IMPLANT

## 2020-03-18 NOTE — H&P (Signed)
Date of examination:  03-10-20  Indication for surgery: to straighten the eyes and allow some binocularity  Pertinent past medical history:  Past Medical History:  Diagnosis Date  . Acid reflux   . Anemia   . Arthritis    "elbows, hands, neck, shoulders" (10/02/2016)  . Calcium blood increased   . Chronic neck pain   . Chronic UTI (urinary tract infection)    "from self caths" (10/02/2016)  . Hepatitis B    acute hepatitis B from lancet  . History of alcoholism ([place])    10/02/2016 "sober since 03/14/2005"  . History of blood transfusion 1984; 1989; 2008   "quite a few"  . History of shortness of breath   . Hypercholesteremia   . Hypothyroidism   . Internal carotid aneurysm dx'd early 2017  . Legally blind in left eye, as defined in [place]   . Melanoma ([place])    left upper arm  . Migraines    "quite a few in a month; at least 15" (10/02/2016)  . Neuropathy    neck; "not sure if it's from DM or Sjogren's" (10/02/2016)  . Pneumonia 1986   "1 yr after major OR when I was under anesthesia for 13 hr"  . Seizures ([place]) ~ 2004; ~ 2013   "seizures from diabetes "  . Self-catheterizes urinary bladder    "since OR in 1984" (10/02/2016)  . Sjogren's syndrome ([place])   . Thyroid goiter    "still present" (10/02/2016)  . Type II diabetes mellitus ([place])   . Urinary retention    self caths  . Wears glasses     Pertinent ocular history:  Surgery on OS for XT, details unknown.  Sjogren's syndrome.  DM I.   Amblyopia OS  Pertinent family history: No family history on file.  General:  Healthy appearing patient in no distress.    Eyes:    Acuity [place] cc OD 20/20  OS 20/150  External: Within normal limits     Anterior segment: mild NS OU  Motility:   XT=35 comitant, LHT=8, XT'=40, rots nl  Fundus: deferred  Refraction:   OD +4 approx  OS +6 approx  Heart: Regular rate and rhythm without murmur     Lungs: Clear to auscultation     Impression:  1) Exotropia, recurrent by history, s/p  strabismus surgery left eye  Reportedly for XT   2) Hyperopia   3) Amblyopia, left eye  Plan: Explore LMR and LLR  If previous surgery turns out to have been for ET, will recess the resected LLR (? Adjustable) and advance the recessed LMR  If previous sx was for XT and the LLR cannot be recessed further, may need to recess RLR in addition to surgery on OS  Derry Skill

## 2020-03-18 NOTE — Discharge Instructions (Signed)
Post Anesthesia Home Care Instructions  Activity: Get plenty of rest for the remainder of the day. A responsible individual must stay with you for 24 hours following the procedure.  For the next 24 hours, DO NOT: -Drive a car -Paediatric nurse -Drink alcoholic beverages -Take any medication unless instructed by your physician -Make any legal decisions or sign important papers.  Meals: Start with liquid foods such as gelatin or soup. Progress to regular foods as tolerated. Avoid greasy, spicy, heavy foods. If nausea and/or vomiting occur, drink only clear liquids until the nausea and/or vomiting subsides. Call your physician if vomiting continues.  Special Instructions/Symptoms: Your throat may feel dry or sore from the anesthesia or the breathing tube placed in your throat during surgery. If this causes discomfort, gargle with warm salt water. The discomfort should disappear within 24 hours.  If you had a scopolamine patch placed behind your ear for the management of post- operative nausea and/or vomiting:  1. The medication in the patch is effective for 72 hours, after which it should be removed.  Wrap patch in a tissue and discard in the trash. Wash hands thoroughly with soap and water. 2. You may remove the patch earlier than 72 hours if you experience unpleasant side effects which may include dry mouth, dizziness or visual disturbances. 3. Avoid touching the patch. Wash your hands with soap and water after contact with the patch.   Dr. Janee Morn Postop Instructions:    Diet: Clear liquids, advance to soft foods then regular diet as tolerated by this evening.  Pain control:   1)  Naproxen 250-500 mg by mouth every 12 hours as needed for pain  2)  Ice pack/cold compress to operated eye  Eye medications:  Tobradex or Zylet eye ointment 1/2 inch in operated eye(s) twice a day  Activity: No swimming for 1 week.  It is OK to let water run over the face and eyes while showering or  taking a bath, even during the first week.  No other restriction on exercise or activity.  Leave patch in place until seen in Dr. Janee Morn office this afternoon for suture adjustment.   Call Dr. Janee Morn office [phone] with any problems or concerns.    Post Anesthesia Home Care Instructions  Activity: Get plenty of rest for the remainder of the day. A responsible individual must stay with you for 24 hours following the procedure.  For the next 24 hours, DO NOT: -Drive a car -Paediatric nurse -Drink alcoholic beverages -Take any medication unless instructed by your physician -Make any legal decisions or sign important papers.  Meals: Start with liquid foods such as gelatin or soup. Progress to regular foods as tolerated. Avoid greasy, spicy, heavy foods. If nausea and/or vomiting occur, drink only clear liquids until the nausea and/or vomiting subsides. Call your physician if vomiting continues.  Special Instructions/Symptoms: Your throat may feel dry or sore from the anesthesia or the breathing tube placed in your throat during surgery. If this causes discomfort, gargle with warm salt water. The discomfort should disappear within 24 hours.  If you had a scopolamine patch placed behind your ear for the management of post- operative nausea and/or vomiting:  1. The medication in the patch is effective for 72 hours, after which it should be removed.  Wrap patch in a tissue and discard in the trash. Wash hands thoroughly with soap and water. 2. You may remove the patch earlier than 72 hours if you experience unpleasant side effects which may  include dry mouth, dizziness or visual disturbances. 3. Avoid touching the patch. Wash your hands with soap and water after contact with the patch.

## 2020-03-18 NOTE — Op Note (Signed)
03/18/2020  10:56 AM  PATIENT:  Mary Erickson    PRE-OPERATIVE DIAGNOSIS:  Exotropia  POST-OPERATIVE DIAGNOSIS:  same  PROCEDURE:  1.  Exploration of left medial and lateral rectus muscles.   2.. Lateral rectus muscle recession 8.0 mm left eye, adjustable   3.  Medial rectus muscle advancement 5.0 mm/resection 3.0 mm left eye    SURGEON:  Derry Skill, MD  ANESTHESIA:   General  COMPLICATIONS: none  OPERATIVE PROCEDURE: After routine preoperative evaluation including informed consent, the patient was taken to the operating room where She was identified by me. General anesthesia was induced without difficulty after placement of appropriate monitors. The patient was prepped and draped in standard sterile fashion. A lid speculum was placed in the left eye.   Through an inferotemporal fornix incision, the left lateral rectus muscle was engaged on a series of muscle hooks and cleared of its fascial attachments.  The conjunctiva was mobile, and minimal scar tissue was encountered.  The tendon was secured with a double-armed 6-0 Vicryl suture, with a locking bite at each border of the muscle, 1 mm from the insertion. The muscle was disinserted. Through an inferonasal fornix incision through conjunctiva and Tenon's fascia, the left medial rectus muscle was engaged on a series of muscle hooks and carefully cleared of its fascial attachments and scar tissue to at least 15 mm posterior to the insertion. The insertion was found about 10.5 mm posterior to the limbus. The muscle was spread between 2 self-retaining hooks. A 2 mm bite was taken of the center of the muscle belly 3 mm posterior to the insertion, and a knot was tied securely at this location. The needle at each end of the double-armed suture was passed from the center of the muscle belly to the periphery, parallel to and 3 mm posterior to the insertion. The muscle was disinserted. Each pole suture was passed posteriorly to anteriorly  through the corresponding end of the muscle stump, then anteriorly to posteriorly near the center of the stump, then posteriorly to anteriorly through the center of the muscle belly, just posterior to the previously placed knot.  The muscle was drawn up to the level of the original insertion, and all slack was removed, effecting a 5.0 mm advancement and a 3 mm resection of the previously recessed medial rectus muscle. The suture ends were tied securely.  Conjunctiva was closed with a single 6-0 plain gut suture.   Attention was then redirected to the lateral rectus muscle.  Each pull suture was passed back into the original muscle stump and crossed swords fashion.  The muscle was pulled up to the level of the original insertion.  The pole sutures were tied together approximately 10 cm above sclera.  With the muscle still held up to the insertion, the pole sutures were joined together with a needle driver at a measured distance of 8.0 mm above sclera.  A noose knot was tied around the pole sutures at this location using 6-0 Vicryl.  The muscle was allowed to hang back until the noose not reached sclera, creating a 8.0 mm adjustable recession of the lateral rectus muscle.  Conjunctiva was loosely apposed with a large loop of 6-0 plain gut.  A 6-0 silk traction suture was placed at the temporal limbus.  The pole, noose, traction, and conjunctival sutures were taped to the lids.  TobraDex ointment was placed in the left eye.  Note that both eyes had been lubricated continually throughout the procedure  with multiple applications of lubricating ointment.  A sterile patch was placed over the left eye.  The patient was awakened without difficulty and taken to the recovery room in stable condition, having suffered no intraoperative or immediate postoperative complications. Derry Skill, MD

## 2020-03-18 NOTE — Anesthesia Procedure Notes (Signed)
Procedure Name: LMA Insertion Date/Time: 03/18/2020 9:27 AM Performed by: Willa Frater, CRNA Pre-anesthesia Checklist: Patient identified, Emergency Drugs available, Suction available and Patient being monitored Patient Re-evaluated:Patient Re-evaluated prior to induction Oxygen Delivery Method: Circle system utilized Preoxygenation: Pre-oxygenation with 100% oxygen Induction Type: IV induction Ventilation: Mask ventilation without difficulty LMA: LMA flexible inserted LMA Size: 4.0 Number of attempts: 1 Airway Equipment and Method: Bite block Placement Confirmation: positive ETCO2 Tube secured with: Tape Dental Injury: Teeth and Oropharynx as per pre-operative assessment

## 2020-03-18 NOTE — H&P (View-Only) (Signed)
Date of examination:  03-10-20  Indication for surgery: to straighten the eyes and allow some binocularity  Pertinent past medical history:  Past Medical History:  Diagnosis Date  . Acid reflux   . Anemia   . Arthritis    "elbows, hands, neck, shoulders" (10/02/2016)  . Calcium blood increased   . Chronic neck pain   . Chronic UTI (urinary tract infection)    "from self caths" (10/02/2016)  . Hepatitis B    acute hepatitis B from lancet  . History of alcoholism ([place])    10/02/2016 "sober since 03/14/2005"  . History of blood transfusion 1984; 1989; 2008   "quite a few"  . History of shortness of breath   . Hypercholesteremia   . Hypothyroidism   . Internal carotid aneurysm dx'd early 2017  . Legally blind in left eye, as defined in [place]   . Melanoma ([place])    left upper arm  . Migraines    "quite a few in a month; at least 15" (10/02/2016)  . Neuropathy    neck; "not sure if it's from DM or Sjogren's" (10/02/2016)  . Pneumonia 1986   "1 yr after major OR when I was under anesthesia for 13 hr"  . Seizures ([place]) ~ 2004; ~ 2013   "seizures from diabetes "  . Self-catheterizes urinary bladder    "since OR in 1984" (10/02/2016)  . Sjogren's syndrome ([place])   . Thyroid goiter    "still present" (10/02/2016)  . Type II diabetes mellitus ([place])   . Urinary retention    self caths  . Wears glasses     Pertinent ocular history:  Surgery on OS for XT, details unknown.  Sjogren's syndrome.  DM I.   Amblyopia OS  Pertinent family history: No family history on file.  General:  Healthy appearing patient in no distress.    Eyes:    Acuity Benjamin cc OD 20/20  OS 20/150  External: Within normal limits     Anterior segment: mild NS OU  Motility:   XT=35 comitant, LHT=8, XT'=40, rots nl  Fundus: deferred  Refraction:   OD +4 approx  OS +6 approx  Heart: Regular rate and rhythm without murmur     Lungs: Clear to auscultation     Impression:  1) Exotropia, recurrent by history, s/p  strabismus surgery left eye  Reportedly for XT   2) Hyperopia   3) Amblyopia, left eye  Plan: Explore LMR and LLR  If previous surgery turns out to have been for ET, will recess the resected LLR (? Adjustable) and advance the recessed LMR  If previous sx was for XT and the LLR cannot be recessed further, may need to recess RLR in addition to surgery on OS  Derry Skill

## 2020-03-18 NOTE — Interval H&P Note (Signed)
History and Physical Interval Note:  03/18/2020 8:59 AM  Mary Erickson  has presented today for surgery, with the diagnosis of EXOTROPIA.  The various methods of treatment have been discussed with the patient and family. After consideration of risks, benefits and other options for treatment, the patient has consented to  Procedure(s): REPAIR STRABISMUS (Bilateral) as a surgical intervention.  The patient's history has been reviewed, patient examined, no change in status, stable for surgery.  I have reviewed the patient's chart and labs.  Questions were answered to the patient's satisfaction.     Derry Skill

## 2020-03-18 NOTE — Anesthesia Preprocedure Evaluation (Signed)
Anesthesia Evaluation  Patient identified by MRN, date of birth, ID band Patient awake    Reviewed: Allergy & Precautions, NPO status , Patient's Chart, lab work & pertinent test results  History of Anesthesia Complications Negative for: history of anesthetic complications  Airway Mallampati: II  TM Distance: >3 FB Neck ROM: Full    Dental  (+) Teeth Intact   Pulmonary neg shortness of breath, neg sleep apnea, neg COPD, neg recent URI, former smoker,    breath sounds clear to auscultation       Cardiovascular hypertension, + Peripheral Vascular Disease   Rhythm:Regular     Neuro/Psych  Headaches, Seizures -,  negative psych ROS   GI/Hepatic GERD  Controlled,(+) Hepatitis -, B  Endo/Other  diabetes, Insulin DependentHypothyroidism   Renal/GU      Musculoskeletal  (+) Arthritis ,   Abdominal   Peds  Hematology   Anesthesia Other Findings   Reproductive/Obstetrics                             Anesthesia Physical  Anesthesia Plan  ASA: III  Anesthesia Plan: General   Post-op Pain Management:    Induction: Intravenous  PONV Risk Score and Plan: 3 and Ondansetron, Dexamethasone, Midazolam and Treatment may vary due to age or medical condition  Airway Management Planned: LMA  Additional Equipment: None  Intra-op Plan:   Post-operative Plan: Extubation in OR  Informed Consent: I have reviewed the patients History and Physical, chart, labs and discussed the procedure including the risks, benefits and alternatives for the proposed anesthesia with the patient or authorized representative who has indicated his/her understanding and acceptance.     Dental advisory given  Plan Discussed with: CRNA and Surgeon  Anesthesia Plan Comments:         Anesthesia Quick Evaluation

## 2020-03-18 NOTE — Anesthesia Postprocedure Evaluation (Signed)
Anesthesia Post Note  Patient: Britainy R He  Procedure(s) Performed: STRABISMUS REPAIR (Left Eye)     Patient location during evaluation: PACU Anesthesia Type: General Level of consciousness: awake and alert Pain management: pain level controlled Vital Signs Assessment: post-procedure vital signs reviewed and stable Respiratory status: spontaneous breathing, nonlabored ventilation and respiratory function stable Cardiovascular status: blood pressure returned to baseline and stable Postop Assessment: no apparent nausea or vomiting Anesthetic complications: no    Last Vitals:  Vitals:   03/18/20 1200 03/18/20 1215  BP: 124/70 105/61  Pulse: 76 73  Resp: 18 13  Temp:    SpO2: 95% 96%    Last Pain:  Vitals:   03/18/20 1151  TempSrc:   PainSc: [place]

## 2020-03-18 NOTE — Transfer of Care (Signed)
Immediate Anesthesia Transfer of Care Note  Patient: Mary Erickson  Procedure(s) Performed: STRABISMUS REPAIR (Left Eye)  Patient Location: PACU  Anesthesia Type:General  Level of Consciousness: sedated  Airway & Oxygen Therapy: Patient Spontanous Breathing and Patient connected to face mask oxygen  Post-op Assessment: Report given to RN and Post -op Vital signs reviewed and stable  Post vital signs: Reviewed and stable  Last Vitals:  Vitals Value Taken Time  BP    Temp    Pulse    Resp    SpO2      Last Pain:  Vitals:   03/18/20 0816  TempSrc: Tympanic  PainSc: 0-No pain         Complications: No apparent anesthesia complications

## 2020-03-21 ENCOUNTER — Encounter: Payer: Self-pay | Admitting: *Deleted

## 2020-03-21 NOTE — Addendum Note (Signed)
Addendum  created 03/21/20 1124 by Tawni Millers, CRNA   Charge Capture section accepted

## 2020-05-12 ENCOUNTER — Encounter (HOSPITAL_COMMUNITY): Payer: Self-pay

## 2020-05-12 ENCOUNTER — Ambulatory Visit (HOSPITAL_COMMUNITY)
Admission: EM | Admit: 2020-05-12 | Discharge: 2020-05-12 | Disposition: A | Discharge status: Home / Self Care | Payer: Medicaid Other | Attending: Emergency Medicine | Admitting: Emergency Medicine

## 2020-05-12 ENCOUNTER — Other Ambulatory Visit: Payer: Self-pay

## 2020-05-12 DIAGNOSIS — N390 Urinary tract infection, site not specified: Secondary | ICD-10-CM | POA: Insufficient documentation

## 2020-05-12 LAB — POCT URINALYSIS DIP (DEVICE)
Bilirubin Urine: NEGATIVE
Glucose, UA: NEGATIVE mg/dL
Ketones, ur: NEGATIVE mg/dL
Nitrite: NEGATIVE
Protein, ur: NEGATIVE mg/dL
Specific Gravity, Urine: 1.01 (ref 1.005–1.030)
Urobilinogen, UA: 0.2 mg/dL (ref 0.0–1.0)
pH: 6.5 (ref 5.0–8.0)

## 2020-05-12 MED ORDER — FLUCONAZOLE 150 MG PO TABS: 150.0000 mg | ORAL_TABLET | Freq: Once | ORAL | 0 refills | Status: AC

## 2020-05-12 MED ORDER — CIPROFLOXACIN HCL 500 MG PO TABS
500.0000 mg | ORAL_TABLET | Freq: Two times a day (BID) | ORAL | 0 refills | Status: AC
Start: 2020-05-12 — End: 2020-05-19

## 2020-05-12 NOTE — ED Provider Notes (Signed)
[place]    CSN: [id number] Arrival date & time: 05/12/20  1226      History   Chief Complaint Chief Complaint  Patient presents with  . Urinary Tract Infection    HPI Mary Erickson is a 54 y.o. female history of chronic UTIs presenting today for evaluation of possible UTI.  Patient reports that over the past couple days she has developed dysuria, incomplete voiding, frequency.  Patient had remote surgery that has caused neurogenic bladder and as a result has to self cath.  Because of this she has had recurrent UTIs.  Recently she has had fewer than her typical symptoms.  She denies any fevers nausea or vomiting.  Denies abdominal pain or back pain.  She reports has done well with Cipro in the past.  HPI  Past Medical History:  Diagnosis Date  . Acid reflux   . Anemia   . Arthritis    "elbows, hands, neck, shoulders" (10/02/2016)  . Calcium blood increased   . Chronic neck pain   . Chronic UTI (urinary tract infection)    "from self caths" (10/02/2016)  . Hepatitis B    acute hepatitis B from lancet  . History of alcoholism ([place])    10/02/2016 "sober since 03/14/2005"  . History of blood transfusion 1984; 1989; 2008   "quite a few"  . History of shortness of breath   . Hypercholesteremia   . Hypothyroidism   . Internal carotid aneurysm dx'd early 2017  . Legally blind in left eye, as defined in [place]   . Melanoma ([place])    left upper arm  . Migraines    "quite a few in a month; at least 15" (10/02/2016)  . Neuropathy    neck; "not sure if it's from DM or Sjogren's" (10/02/2016)  . Pneumonia 1986   "1 yr after major OR when I was under anesthesia for 13 hr"  . Seizures ([place]) ~ 2004; ~ 2013   "seizures from diabetes "  . Self-catheterizes urinary bladder    "since OR in 1984" (10/02/2016)  . Sjogren's syndrome ([place])   . Thyroid goiter    "still present" (10/02/2016)  . Type II diabetes mellitus ([place])   . Urinary retention    self caths  . Wears  glasses     Patient Active Problem List   Diagnosis Date Noted  . Dizziness 12/11/2019  . Colitis 12/11/2019  . Abnormal CT scan, colon   . Acute colitis 12/08/2019  . Orthostatic hypotension 12/08/2019  . Type 1 diabetes mellitus ([place]) 06/30/2019  . Hashimoto's disease 06/30/2019  . Sjogren's syndrome ([place]) 06/30/2019  . Hx of Hypertension 06/30/2019  . Neurogenic bladder 06/30/2019  . Malignant melanoma of left upper arm ([place]) 10/02/2016    Past Surgical History:  Procedure Laterality Date  . ABDOMINAL HYSTERECTOMY  2008  . ECTOPIC PREGNANCY SURGERY  1989  . EXCISION MELANOMA WITH SENTINEL LYMPH NODE BIOPSY Left 10/02/2016   Procedure: WIDE LOCAL EXCISION AND ADVANCEMENT FLAP CLOSURE LEFT UPPER  MELANOMA WITH SENTINEL LYMPH NODE MAPPING AND BIOPSY;  Surgeon: [name], MD;  Location: [facility];  Service: General;  Laterality: Left;  . EYE SURGERY    . LAPAROSCOPIC CHOLECYSTECTOMY  ~ 01/2016  . MELANOMA EXCISION WITH SENTINEL LYMPH NODE BIOPSY Left 10/02/2016   WIDE LOCAL EXCISION AND ADVANCEMENT FLAP CLOSURE LEFT UPPER  MELANOMA WITH SENTINEL LYMPH NODE MAPPING AND BIOPSY  . SHOULDER ARTHROSCOPY W/ ROTATOR CUFF REPAIR Right 2014   "partial"  .  STRABISMUS SURGERY Left 1971  . STRABISMUS SURGERY Left 03/18/2020   Procedure: STRABISMUS REPAIR;  Surgeon: Everitt Amber, MD;  Location: [facility];  Service: Ophthalmology;  Laterality: Left;  . TUMOR EXCISION  1984   from tail bone and part of tail bone removed    OB History   No obstetric history on file.      Home Medications    Prior to Admission medications   Medication Sig Start Date End Date Taking? Authorizing Provider  Ascorbic Acid (VITAMIN C) 1000 MG tablet Take 1,000 mg by mouth daily.   Yes [provider]  Cholecalciferol (D3-1000) 25 MCG (1000 UT) capsule Take 1,000 Units by mouth daily.    Yes [provider]  Cyanocobalamin (B-12) 2500 MCG TABS Take 2,500 mcg by mouth daily.     Yes [provider]  diclofenac Sodium (VOLTAREN) 1 % GEL Apply 2 g topically 4 (four) times daily. 12/17/19  Yes Darr, Marguerita Beards, PA-C  insulin aspart (NOVOLOG) 100 UNIT/ML injection Inject 50-60 Units into the skin See admin instructions. Insulin pump basil rate 50-60 units throughout day. Additional 0-5 units per bolos.   Yes [provider]  Insulin Detemir (LEVEMIR FLEXTOUCH) 100 UNIT/ML Pen Inject 4 Units into the skin 2 (two) times daily as needed (in case pump breaks).  09/27/17  Yes [provider]  Insulin Human (INSULIN PUMP) SOLN Inject into the skin.   Yes [provider]  Levothyroxine Sodium 50 MCG CAPS Take 50 mcg by mouth daily. 11/10/19  Yes [provider]  Magnesium 400 MG CAPS Take 400 mg by mouth daily.   Yes [provider]  melatonin 1 MG TABS tablet Take 6 mg by mouth at bedtime.   Yes [provider]  Multiple Vitamins-Minerals (MULTIVITAMINS THER. W/MINERALS) TABS Take 1 tablet by mouth daily.   Yes [provider]  Naphazoline HCl (CLEAR EYES OP) Place 1 drop into both eyes daily.   Yes [provider]  naproxen sodium (ALEVE) 220 MG tablet Take 220 mg by mouth as needed (pain).   Yes [provider]  omeprazole (PRILOSEC) 40 MG capsule Take 40 mg by mouth daily. 05/04/19  Yes [provider]  ondansetron (ZOFRAN ODT) 4 MG disintegrating tablet Take 1 tablet (4 mg total) by mouth every 8 (eight) hours as needed for nausea or vomiting. 12/07/19  Yes Sherwood Gambler, MD  selenium 50 MCG TABS tablet Take 200 mcg by mouth daily.   Yes [provider]  vitamin E (VITAMIN E) 400 UNIT capsule Take 400 Units by mouth daily.   Yes [provider]  Zinc 50 MG TABS Take 50 mg by mouth daily.    Yes [provider]  acetaminophen (TYLENOL) 325 MG tablet Take 650 mg by mouth every 6 (six) hours as needed for mild pain.    [provider]  ciprofloxacin  (CIPRO) 500 MG tablet Take 1 tablet (500 mg total) by mouth every 12 (twelve) hours for 7 days. 05/12/20 05/19/20  Milanya Sunderland C, PA-C  fluconazole (DIFLUCAN) 150 MG tablet Take 1 tablet (150 mg total) by mouth once for 1 dose. 05/12/20 05/12/20  Janelle Spellman, Elesa Hacker, PA-C    Family History History reviewed. No pertinent family history.  Social History Social History   Tobacco Use  . Smoking status: Former Smoker    Packs/day: 1.50    Years: 24.00    Pack years: 36.00    Types: Cigarettes  Quit date: 2008    Years since quitting: 13.5  . Smokeless tobacco: Never Used  Substance Use Topics  . Alcohol use: No    Comment: 10/02/2016 "sober since 03/14/2005"  . Drug use: Yes    Types: Marijuana    Comment: 10/02/2016 " occasional; nothing since before 2006"     Allergies   Doxycycline, Aspirin, Ciprofloxacin, Nsaids, Amoxicillin-pot clavulanate, Codeine, Hydrocodone-acetaminophen, Oxycodone, Sulfa antibiotics, and Sulfasalazine   Review of Systems Review of Systems  Constitutional: Negative for fever.  Respiratory: Negative for shortness of breath.   Cardiovascular: Negative for chest pain.  Gastrointestinal: Negative for abdominal pain, diarrhea, nausea and vomiting.  Genitourinary: Positive for dysuria and frequency. Negative for flank pain, genital sores, hematuria, menstrual problem, vaginal bleeding, vaginal discharge and vaginal pain.  Musculoskeletal: Negative for back pain.  Skin: Negative for rash.  Neurological: Negative for dizziness, light-headedness and headaches.     Physical Exam Triage Vital Signs ED Triage Vitals  Enc Vitals Group     BP 05/12/20 1236 106/64     Pulse Rate 05/12/20 1236 79     Resp 05/12/20 1236 16     Temp 05/12/20 1236 98.2 F (36.8 C)     Temp Source 05/12/20 1236 Oral     SpO2 05/12/20 1236 100 %     Weight --      Height --      Head Circumference --      Peak Flow --      Pain Score 05/12/20 1239 0     Pain Loc --      Pain  Edu? --      Excl. in [place]? --    No data found.  Updated Vital Signs BP 106/64 (BP Location: Right Arm)   Pulse 79   Temp 98.2 F (36.8 C) (Oral)   Resp 16   SpO2 100%   Visual Acuity Right Eye Distance:   Left Eye Distance:   Bilateral Distance:    Right Eye Near:   Left Eye Near:    Bilateral Near:     Physical Exam Vitals and nursing note reviewed.  Constitutional:      Appearance: She is well-developed.     Comments: No acute distress  HENT:     Head: Normocephalic and atraumatic.     Nose: Nose normal.  Eyes:     Conjunctiva/sclera: Conjunctivae normal.  Cardiovascular:     Rate and Rhythm: Normal rate.  Pulmonary:     Effort: Pulmonary effort is normal. No respiratory distress.  Abdominal:     General: There is no distension.  Musculoskeletal:        General: Normal range of motion.     Cervical back: Neck supple.  Skin:    General: Skin is warm and dry.  Neurological:     Mental Status: She is alert and oriented to person, place, and time.      UC Treatments / Results  Labs (all labs ordered are listed, but only abnormal results are displayed) Labs Reviewed  POCT URINALYSIS DIP (DEVICE) - Abnormal; Notable for the following components:      Result Value   Hgb urine dipstick SMALL (*)    Leukocytes,Ua LARGE (*)    All other components within normal limits  URINE CULTURE    EKG   Radiology No results found.  Procedures Procedures (including critical care time)  Medications Ordered in UC Medications - No data to display  Initial Impression / Assessment and  Plan / UC Course  I have reviewed the triage vital signs and the nursing notes.  Pertinent labs & imaging results that were available during my care of the patient were reviewed by me and considered in my medical decision making (see chart for details).     Large leuks on UA, small hemoglobin, treating for UTI with Cipro twice daily x1 week.  Provide Diflucan for any subsequent  yeast infection that may develop from antibiotics.  Urine culture pending.  Discussed strict return precautions. Patient verbalized understanding and is agreeable with plan.  Final Clinical Impressions(s) / UC Diagnoses   Final diagnoses:  Lower urinary tract infectious disease     Discharge Instructions     Urine showed evidence of infection. We are treating you with cipro-twice daily for 1 week. Be sure to take full course. Stay hydrated- urine should be pale yellow to clear.   Please return or follow up with your primary provider if symptoms not improving with treatment. Please return sooner if you have worsening of symptoms or develop fever, nausea, vomiting, abdominal pain, back pain, lightheadedness, dizziness.   ED Prescriptions    Medication Sig Dispense Auth. Provider   ciprofloxacin (CIPRO) 500 MG tablet Take 1 tablet (500 mg total) by mouth every 12 (twelve) hours for 7 days. 14 tablet Cataldo Cosgriff C, PA-C   fluconazole (DIFLUCAN) 150 MG tablet Take 1 tablet (150 mg total) by mouth once for 1 dose. 2 tablet Sammie Schermerhorn, [place] C, PA-C     PDMP not reviewed this encounter.   Janith Lima, [place] 05/12/20 [phone]

## 2020-05-12 NOTE — Discharge Instructions (Signed)
Urine showed evidence of infection. We are treating you with cipro- twice daily for 1 week. Be sure to take full course. Stay hydrated- urine should be pale yellow to clear.   Please return or follow up with your primary provider if symptoms not improving with treatment. Please return sooner if you have worsening of symptoms or develop fever, nausea, vomiting, abdominal pain, back pain, lightheadedness, dizziness. 

## 2020-05-12 NOTE — ED Triage Notes (Addendum)
Pt presents today with cloudy urine, and burning during self caths. Pt states she has been self cathing since 17 s/p surgery. Pt states she has had chronic UTIs in past, but has not had trouble recently. Pt denies abdominal pain, N/V, chills, body aches, fever. Pt states presentation today is typical of UTI.

## 2020-05-14 LAB — URINE CULTURE: Culture: >=100000 — AB

## 2020-08-05 ENCOUNTER — Encounter (HOSPITAL_COMMUNITY): Payer: Self-pay | Admitting: *Deleted

## 2020-08-05 ENCOUNTER — Ambulatory Visit (HOSPITAL_COMMUNITY)
Admission: EM | Admit: 2020-08-05 | Discharge: 2020-08-05 | Disposition: A | Discharge status: Home / Self Care | Payer: Medicaid Other | Attending: Internal Medicine | Admitting: Internal Medicine

## 2020-08-05 ENCOUNTER — Other Ambulatory Visit: Payer: Self-pay

## 2020-08-05 DIAGNOSIS — R8290 Unspecified abnormal findings in urine: Secondary | ICD-10-CM | POA: Insufficient documentation

## 2020-08-05 DIAGNOSIS — E109 Type 1 diabetes mellitus without complications: Secondary | ICD-10-CM

## 2020-08-05 DIAGNOSIS — R350 Frequency of micturition: Secondary | ICD-10-CM | POA: Insufficient documentation

## 2020-08-05 LAB — POCT URINALYSIS DIPSTICK, ED / UC
Bilirubin Urine: NEGATIVE
Glucose, UA: NEGATIVE mg/dL
Hgb urine dipstick: NEGATIVE
Ketones, ur: NEGATIVE mg/dL
Leukocytes,Ua: NEGATIVE
Nitrite: NEGATIVE
Protein, ur: NEGATIVE mg/dL
Specific Gravity, Urine: <=1.005 (ref 1.005–1.030)
Urobilinogen, UA: 0.2 mg/dL (ref 0.0–1.0)
pH: 5 (ref 5.0–8.0)

## 2020-08-05 NOTE — ED Triage Notes (Signed)
Patient states that she has experienced urinary frequency and cloudy urine x 1 week.Patient self caths due to previous surgery.  Patient states she is dehydrated and blood sugars have had highs and lows. Patient states she has been off of insulin pump for two days due to trouble with controlling blood sugars. Patient is taking Levemir and Novolog currently. Patient states she thinks that CBG was 109 this morning.

## 2020-08-05 NOTE — ED Provider Notes (Signed)
[place] [facility]   MRN: [id number] DOB: [birth year]  Subjective:   Mary Erickson is a 54 y.o. female presenting for concerns for UTI.  Patient had a urinary tract infection about 2 months ago.  Has had urinary frequency, cloudy urine.  States that she is trying to hydrate but drinks an excessive amount of green tea.  Has been trying to manage her blood sugars as well but states that they have been very high and subsequently very low, difficult to manage for her.  Denies active dysuria, hematuria.  No current facility-administered medications for this encounter.  Current Outpatient Medications:  .  cycloSPORINE (RESTASIS) 0.05 % ophthalmic emulsion, Place 1 drop into both eyes 2 times daily., Disp: , Rfl:  .  acetaminophen (TYLENOL) 325 MG tablet, Take 650 mg by mouth every 6 (six) hours as needed for mild pain., Disp: , Rfl:  .  Ascorbic Acid (VITAMIN C) 1000 MG tablet, Take 1,000 mg by mouth daily., Disp: , Rfl:  .  Cholecalciferol (D3-1000) 25 MCG (1000 UT) capsule, Take 1,000 Units by mouth daily. , Disp: , Rfl:  .  Cyanocobalamin (B-12) 2500 MCG TABS, Take 2,500 mcg by mouth daily. , Disp: , Rfl:  .  diclofenac Sodium (VOLTAREN) 1 % GEL, Apply 2 g topically 4 (four) times daily., Disp: 50 g, Rfl: 0 .  insulin aspart (NOVOLOG) 100 UNIT/ML injection, Inject 50-60 Units into the skin See admin instructions. Insulin pump basil rate 50-60 units throughout day. Additional 0-5 units per bolos., Disp: , Rfl:  .  Insulin Detemir (LEVEMIR FLEXTOUCH) 100 UNIT/ML Pen, Inject 4 Units into the skin 2 (two) times daily as needed (in case pump breaks). , Disp: , Rfl:  .  Insulin Human (INSULIN PUMP) SOLN, Inject into the skin., Disp: , Rfl:  .  Levothyroxine Sodium 50 MCG CAPS, Take 50 mcg by mouth daily., Disp: , Rfl:  .  Magnesium 400 MG CAPS, Take 400 mg by mouth daily., Disp: , Rfl:  .  melatonin 1 MG TABS tablet, Take 6 mg by mouth at bedtime., Disp: , Rfl:  .  Multiple  Vitamins-Minerals (MULTIVITAMINS THER. W/MINERALS) TABS, Take 1 tablet by mouth daily., Disp: , Rfl:  .  Naphazoline HCl (CLEAR EYES OP), Place 1 drop into both eyes daily., Disp: , Rfl:  .  naproxen sodium (ALEVE) 220 MG tablet, Take 220 mg by mouth as needed (pain)., Disp: , Rfl:  .  omeprazole (PRILOSEC) 40 MG capsule, Take 40 mg by mouth daily., Disp: , Rfl:  .  ondansetron (ZOFRAN ODT) 4 MG disintegrating tablet, Take 1 tablet (4 mg total) by mouth every 8 (eight) hours as needed for nausea or vomiting., Disp: 10 tablet, Rfl: 0 .  selenium 50 MCG TABS tablet, Take 200 mcg by mouth daily., Disp: , Rfl:  .  vitamin E (VITAMIN E) 400 UNIT capsule, Take 400 Units by mouth daily., Disp: , Rfl:  .  Zinc 50 MG TABS, Take 50 mg by mouth daily. , Disp: , Rfl:    Allergies  Allergen Reactions  . Doxycycline Other (See Comments)    Elevates liver enzymes   . Aspirin Other (See Comments)    Can take 81 mg but has to be coated; No other Aspirin due to bleeding in stools from ulcers  . Ciprofloxacin Hives    Per pt, she can tolerate oral but not IV  . Nsaids Other (See Comments)    Causes kidney and skin problems Kidney  and skin problems  . Amoxicillin-Pot Clavulanate Nausea Only  . Codeine Nausea And Vomiting  . Hydrocodone-Acetaminophen Nausea And Vomiting  . Oxycodone Nausea And Vomiting  . Sulfa Antibiotics Nausea And Vomiting  . Sulfasalazine Nausea And Vomiting    Past Medical History:  Diagnosis Date  . Acid reflux   . Anemia   . Arthritis    "elbows, hands, neck, shoulders" (10/02/2016)  . Calcium blood increased   . Chronic neck pain   . Chronic UTI (urinary tract infection)    "from self caths" (10/02/2016)  . Hepatitis B    acute hepatitis B from lancet  . History of alcoholism ([place])    10/02/2016 "sober since 03/14/2005"  . History of blood transfusion 1984; 1989; 2008   "quite a few"  . History of shortness of breath   . Hypercholesteremia   . Hypothyroidism   .  Internal carotid aneurysm dx'd early 2017  . Legally blind in left eye, as defined in [place]   . Melanoma ([place])    left upper arm  . Migraines    "quite a few in a month; at least 15" (10/02/2016)  . Neuropathy    neck; "not sure if it's from DM or Sjogren's" (10/02/2016)  . Pneumonia 1986   "1 yr after major OR when I was under anesthesia for 13 hr"  . Seizures ([place]) ~ 2004; ~ 2013   "seizures from diabetes "  . Self-catheterizes urinary bladder    "since OR in 1984" (10/02/2016)  . Sjogren's syndrome ([place])   . Thyroid goiter    "still present" (10/02/2016)  . Type II diabetes mellitus ([place])   . Urinary retention    self caths  . Wears glasses      Past Surgical History:  Procedure Laterality Date  . ABDOMINAL HYSTERECTOMY  2008  . ECTOPIC PREGNANCY SURGERY  1989  . EXCISION MELANOMA WITH SENTINEL LYMPH NODE BIOPSY Left 10/02/2016   Procedure: WIDE LOCAL EXCISION AND ADVANCEMENT FLAP CLOSURE LEFT UPPER  MELANOMA WITH SENTINEL LYMPH NODE MAPPING AND BIOPSY;  Surgeon: [name], MD;  Location: [facility];  Service: General;  Laterality: Left;  . EYE SURGERY    . LAPAROSCOPIC CHOLECYSTECTOMY  ~ 01/2016  . MELANOMA EXCISION WITH SENTINEL LYMPH NODE BIOPSY Left 10/02/2016   WIDE LOCAL EXCISION AND ADVANCEMENT FLAP CLOSURE LEFT UPPER  MELANOMA WITH SENTINEL LYMPH NODE MAPPING AND BIOPSY  . SHOULDER ARTHROSCOPY W/ ROTATOR CUFF REPAIR Right 2014   "partial"  . STRABISMUS SURGERY Left 1971  . STRABISMUS SURGERY Left 03/18/2020   Procedure: STRABISMUS REPAIR;  Surgeon: Everitt Amber, MD;  Location: [facility];  Service: Ophthalmology;  Laterality: Left;  . TUMOR EXCISION  1984   from tail bone and part of tail bone removed    History reviewed. No pertinent family history.  Social History   Tobacco Use  . Smoking status: Former Smoker    Packs/day: 1.50    Years: 24.00    Pack years: 36.00    Types: Cigarettes    Quit date: 2008    Years since quitting: 13.7  .  Smokeless tobacco: Never Used  Substance Use Topics  . Alcohol use: No    Comment: 10/02/2016 "sober since 03/14/2005"  . Drug use: Yes    Types: Marijuana    Comment: 10/02/2016 " occasional; nothing since before 2006"    ROS   Objective:   Vitals: BP 139/79 (BP Location: Left Arm)   Pulse 73   Temp  98.2 F (36.8 C) (Oral)   Resp 16   SpO2 100%   Physical Exam Constitutional:      General: She is not in acute distress.    Appearance: Normal appearance. She is well-developed. She is not ill-appearing, toxic-appearing or diaphoretic.  HENT:     Head: Normocephalic and atraumatic.     Nose: Nose normal.     Mouth/Throat:     Mouth: Mucous membranes are moist.     Pharynx: Oropharynx is clear.  Eyes:     General: No scleral icterus.       Right eye: No discharge.        Left eye: No discharge.     Extraocular Movements: Extraocular movements intact.     Conjunctiva/sclera: Conjunctivae normal.     Pupils: Pupils are equal, round, and reactive to light.  Cardiovascular:     Rate and Rhythm: Normal rate.  Pulmonary:     Effort: Pulmonary effort is normal.  Abdominal:     General: Bowel sounds are normal. There is no distension.     Palpations: Abdomen is soft. There is no mass.     Tenderness: There is no abdominal tenderness. There is no right CVA tenderness, left CVA tenderness, guarding or rebound.  Musculoskeletal:        General: No swelling.     Right lower leg: No edema.     Left lower leg: No edema.  Skin:    General: Skin is warm and dry.  Neurological:     General: No focal deficit present.     Mental Status: She is alert and oriented to person, place, and time.  Psychiatric:        Mood and Affect: Mood normal.        Behavior: Behavior normal.        Thought Content: Thought content normal.        Judgment: Judgment normal.     Results for orders placed or performed during the hospital encounter of 08/05/20 (from the past 24 hour(s))  POCT  Urinalysis Dipstick (ED/UC)     Status: None   Collection Time: 08/05/20 10:01 AM  Result Value Ref Range   Glucose, UA NEGATIVE NEGATIVE mg/dL   Bilirubin Urine NEGATIVE NEGATIVE   Ketones, ur NEGATIVE NEGATIVE mg/dL   Specific Gravity, Urine <=1.005 1.005 - 1.030   Hgb urine dipstick NEGATIVE NEGATIVE   pH 5.0 5.0 - 8.0   Protein, ur NEGATIVE NEGATIVE mg/dL   Urobilinogen, UA 0.2 0.0 - 1.0 mg/dL   Nitrite NEGATIVE NEGATIVE   Leukocytes,Ua NEGATIVE NEGATIVE    Assessment and Plan :   PDMP not reviewed this encounter.  1. Urinary frequency   2. Cloudy urine   3. Type 1 diabetes mellitus without complication ([place])     Labs pending.  Counseled on need to avoid urinary irritants including drinking a lot of caffeinated beverages like green tea.  We will treat based off of urine culture results.  Follow-up with regular doctors otherwise. Counseled patient on potential for adverse effects with medications prescribed/recommended today, ER and return-to-clinic precautions discussed, patient verbalized understanding.    Jaynee Eagles, [place] 08/05/20 [phone]

## 2020-08-06 LAB — URINE CULTURE: Culture: NO GROWTH

## 2021-04-16 ENCOUNTER — Observation Stay (HOSPITAL_COMMUNITY)
Admission: EM | Admit: 2021-04-16 | Discharge: 2021-04-17 | DRG: 641 | Disposition: A | Discharge status: Home / Self Care | Payer: Medicaid Other | Attending: Internal Medicine | Admitting: Internal Medicine

## 2021-04-16 ENCOUNTER — Encounter (HOSPITAL_COMMUNITY): Payer: Self-pay | Admitting: Emergency Medicine

## 2021-04-16 ENCOUNTER — Emergency Department (HOSPITAL_COMMUNITY): Payer: Medicaid Other

## 2021-04-16 DIAGNOSIS — E871 Hypo-osmolality and hyponatremia: Principal | ICD-10-CM | POA: Diagnosis present

## 2021-04-16 DIAGNOSIS — Z90710 Acquired absence of both cervix and uterus: Secondary | ICD-10-CM | POA: Diagnosis not present

## 2021-04-16 DIAGNOSIS — Z87891 Personal history of nicotine dependence: Secondary | ICD-10-CM | POA: Diagnosis not present

## 2021-04-16 DIAGNOSIS — Z20822 Contact with and (suspected) exposure to covid-19: Secondary | ICD-10-CM | POA: Diagnosis present

## 2021-04-16 DIAGNOSIS — Z5329 Procedure and treatment not carried out because of patient's decision for other reasons: Secondary | ICD-10-CM | POA: Diagnosis not present

## 2021-04-16 DIAGNOSIS — E7800 Pure hypercholesterolemia, unspecified: Secondary | ICD-10-CM | POA: Diagnosis present

## 2021-04-16 DIAGNOSIS — E109 Type 1 diabetes mellitus without complications: Secondary | ICD-10-CM | POA: Diagnosis not present

## 2021-04-16 DIAGNOSIS — G8929 Other chronic pain: Secondary | ICD-10-CM | POA: Diagnosis not present

## 2021-04-16 DIAGNOSIS — Z9641 Presence of insulin pump (external) (internal): Secondary | ICD-10-CM | POA: Diagnosis not present

## 2021-04-16 DIAGNOSIS — R197 Diarrhea, unspecified: Secondary | ICD-10-CM

## 2021-04-16 DIAGNOSIS — Z79890 Hormone replacement therapy (postmenopausal): Secondary | ICD-10-CM

## 2021-04-16 DIAGNOSIS — K219 Gastro-esophageal reflux disease without esophagitis: Secondary | ICD-10-CM | POA: Diagnosis not present

## 2021-04-16 DIAGNOSIS — K580 Irritable bowel syndrome with diarrhea: Secondary | ICD-10-CM | POA: Diagnosis present

## 2021-04-16 DIAGNOSIS — Z7952 Long term (current) use of systemic steroids: Secondary | ICD-10-CM

## 2021-04-16 DIAGNOSIS — R42 Dizziness and giddiness: Secondary | ICD-10-CM | POA: Diagnosis present

## 2021-04-16 DIAGNOSIS — E063 Autoimmune thyroiditis: Secondary | ICD-10-CM | POA: Diagnosis not present

## 2021-04-16 DIAGNOSIS — H548 Legal blindness, as defined in USA: Secondary | ICD-10-CM | POA: Diagnosis present

## 2021-04-16 DIAGNOSIS — K589 Irritable bowel syndrome without diarrhea: Secondary | ICD-10-CM

## 2021-04-16 DIAGNOSIS — Z85820 Personal history of malignant melanoma of skin: Secondary | ICD-10-CM | POA: Diagnosis not present

## 2021-04-16 DIAGNOSIS — N319 Neuromuscular dysfunction of bladder, unspecified: Secondary | ICD-10-CM | POA: Diagnosis not present

## 2021-04-16 DIAGNOSIS — Z79899 Other long term (current) drug therapy: Secondary | ICD-10-CM

## 2021-04-16 DIAGNOSIS — E860 Dehydration: Secondary | ICD-10-CM | POA: Diagnosis present

## 2021-04-16 DIAGNOSIS — M3500 Sicca syndrome, unspecified: Secondary | ICD-10-CM | POA: Diagnosis present

## 2021-04-16 DIAGNOSIS — Z794 Long term (current) use of insulin: Secondary | ICD-10-CM

## 2021-04-16 LAB — CBC WITH DIFFERENTIAL/PLATELET
Abs Immature Granulocytes: 0.02 10*3/uL (ref 0.00–0.07)
Basophils Absolute: 0.1 10*3/uL (ref 0.0–0.1)
Basophils Relative: 1 %
Eosinophils Absolute: 0.1 10*3/uL (ref 0.0–0.5)
Eosinophils Relative: 2 %
HCT: 33.9 % — ABNORMAL LOW (ref 36.0–46.0)
Hemoglobin: 11.4 g/dL — ABNORMAL LOW (ref 12.0–15.0)
Immature Granulocytes: 0 %
Lymphocytes Relative: 24 %
Lymphs Abs: 2 10*3/uL (ref 0.7–4.0)
MCH: 31.5 pg (ref 26.0–34.0)
MCHC: 33.6 g/dL (ref 30.0–36.0)
MCV: 93.6 fL (ref 80.0–100.0)
Monocytes Absolute: 1.1 10*3/uL — ABNORMAL HIGH (ref 0.1–1.0)
Monocytes Relative: 14 %
Neutro Abs: 4.9 10*3/uL (ref 1.7–7.7)
Neutrophils Relative %: 59 %
Platelets: 188 10*3/uL (ref 150–400)
RBC: 3.62 MIL/uL — ABNORMAL LOW (ref 3.87–5.11)
RDW: 12.4 % (ref 11.5–15.5)
WBC: 8.3 10*3/uL (ref 4.0–10.5)
nRBC: 0 % (ref 0.0–0.2)

## 2021-04-16 LAB — CBG MONITORING, ED: Glucose-Capillary: 163 mg/dL — ABNORMAL HIGH (ref 70–99)

## 2021-04-16 LAB — COMPREHENSIVE METABOLIC PANEL
ALT: 37 U/L (ref 0–44)
AST: 38 U/L (ref 15–41)
Albumin: 3.3 g/dL — ABNORMAL LOW (ref 3.5–5.0)
Alkaline Phosphatase: 83 U/L (ref 38–126)
Anion gap: 8 (ref 5–15)
BUN: 9 mg/dL (ref 6–20)
CO2: 25 mmol/L (ref 22–32)
Calcium: 8.3 mg/dL — ABNORMAL LOW (ref 8.9–10.3)
Chloride: 89 mmol/L — ABNORMAL LOW (ref 98–111)
Creatinine, Ser: 0.65 mg/dL (ref 0.44–1.00)
GFR, Estimated: >60 mL/min (ref >60)
Glucose, Bld: 163 mg/dL — ABNORMAL HIGH (ref 70–99)
Potassium: 3.7 mmol/L (ref 3.5–5.1)
Sodium: 122 mmol/L — ABNORMAL LOW (ref 135–145)
Total Bilirubin: 1.3 mg/dL — ABNORMAL HIGH (ref 0.3–1.2)
Total Protein: 6 g/dL — ABNORMAL LOW (ref 6.5–8.1)

## 2021-04-16 LAB — TROPONIN I (HIGH SENSITIVITY): Troponin I (High Sensitivity): 3 ng/L (ref <18)

## 2021-04-16 LAB — LIPASE, BLOOD: Lipase: 21 U/L (ref 11–51)

## 2021-04-16 NOTE — ED Provider Notes (Signed)
Emergency Medicine Provider Triage Evaluation Note  Mary Erickson , a 55 y.o. female  was evaluated in triage.  Pt complains of feeling dizzy. With EMS she had 4 of Zofran and 500 cc of IV fluids. She has had diarrhea for 2 weeks.  She states she normally has orthostatic changes however those are only when she stands up and now she feels a little lightheaded when she is lying down. She has pain in her left shoulder that is worse than usual. She has been getting steroid shots to try and help with her shoulder pain.  No fevers.  No pain currently..  Review of Systems  Positive: Lightheaded, diarrhea, nausea Negative: Fevers, shortness of breath  Physical Exam  BP (!) 142/79 (BP Location: Left Arm)   Pulse 68   Temp 98.7 F (37.1 C) (Oral)   Resp 18   SpO2 100%  Gen:   Awake, no distress   Resp:  Normal effort  MSK:   Moves extremities without difficulty  Other:  Appears nauseated, holding vomit bag.  Speaks in full sentences.  Medical Decision Making  Medically screening exam initiated at 7:21 PM.  Appropriate orders placed.  Mary Erickson was informed that the remainder of the evaluation will be completed by another provider, this initial triage assessment does not replace that evaluation, and the importance of remaining in the ED until their evaluation is complete.     Ollen Gross 04/16/21 Delorise Shiner, MD 04/16/21 1940

## 2021-04-16 NOTE — ED Triage Notes (Signed)
Pt reports more dizziness than normal and extreme nausea, was given zofran by EMS however pt has had no relief.  Pt has had diarrhea X2 weeks.

## 2021-04-17 ENCOUNTER — Encounter (HOSPITAL_COMMUNITY): Payer: Self-pay | Admitting: Family Medicine

## 2021-04-17 ENCOUNTER — Other Ambulatory Visit: Payer: Self-pay

## 2021-04-17 DIAGNOSIS — E871 Hypo-osmolality and hyponatremia: Secondary | ICD-10-CM

## 2021-04-17 DIAGNOSIS — K589 Irritable bowel syndrome without diarrhea: Secondary | ICD-10-CM

## 2021-04-17 LAB — BASIC METABOLIC PANEL
Anion gap: 7 (ref 5–15)
BUN: 7 mg/dL (ref 6–20)
CO2: 27 mmol/L (ref 22–32)
Calcium: 9.5 mg/dL (ref 8.9–10.3)
Chloride: 93 mmol/L — ABNORMAL LOW (ref 98–111)
Creatinine, Ser: 0.71 mg/dL (ref 0.44–1.00)
GFR, Estimated: >60 mL/min (ref >60)
Glucose, Bld: 208 mg/dL — ABNORMAL HIGH (ref 70–99)
Potassium: 4.5 mmol/L (ref 3.5–5.1)
Sodium: 127 mmol/L — ABNORMAL LOW (ref 135–145)

## 2021-04-17 LAB — RESP PANEL BY RT-PCR (FLU A&B, COVID) ARPGX2
Influenza A by PCR: NEGATIVE
Influenza B by PCR: NEGATIVE
SARS Coronavirus 2 by RT PCR: NEGATIVE

## 2021-04-17 LAB — CBG MONITORING, ED
Glucose-Capillary: 196 mg/dL — ABNORMAL HIGH (ref 70–99)
Glucose-Capillary: 154 mg/dL — ABNORMAL HIGH (ref 70–99)
Glucose-Capillary: 118 mg/dL — ABNORMAL HIGH (ref 70–99)
Glucose-Capillary: 76 mg/dL (ref 70–99)

## 2021-04-17 LAB — TROPONIN I (HIGH SENSITIVITY): Troponin I (High Sensitivity): 3 ng/L (ref <18)

## 2021-04-17 LAB — CBC
HCT: 39.7 % (ref 36.0–46.0)
Hemoglobin: 13.6 g/dL (ref 12.0–15.0)
MCH: 31.4 pg (ref 26.0–34.0)
MCHC: 34.3 g/dL (ref 30.0–36.0)
MCV: 91.7 fL (ref 80.0–100.0)
Platelets: 185 10*3/uL (ref 150–400)
RBC: 4.33 MIL/uL (ref 3.87–5.11)
RDW: 12.1 % (ref 11.5–15.5)
WBC: 9 10*3/uL (ref 4.0–10.5)
nRBC: 0 % (ref 0.0–0.2)

## 2021-04-17 LAB — HEMOGLOBIN A1C
Hgb A1c MFr Bld: 6.6 % — ABNORMAL HIGH (ref 4.8–5.6)
Mean Plasma Glucose: 143 mg/dL

## 2021-04-17 LAB — OSMOLALITY: Osmolality: 278 mOsm/kg (ref 275–295)

## 2021-04-17 LAB — HIV ANTIBODY (ROUTINE TESTING W REFLEX): HIV Screen 4th Generation wRfx: NONREACTIVE

## 2021-04-17 LAB — SODIUM, URINE, RANDOM: Sodium, Ur: 24 mmol/L

## 2021-04-17 LAB — TSH: TSH: 1.016 u[IU]/mL (ref 0.350–4.500)

## 2021-04-17 MED ORDER — INSULIN DETEMIR 100 UNIT/ML ~~LOC~~ SOLN
10.0000 [IU] | Freq: Two times a day (BID) | SUBCUTANEOUS | Status: DC
  Administered 2021-04-17: 10 [IU] via SUBCUTANEOUS
  Filled 2021-04-17 (×3): qty 0.1

## 2021-04-17 MED ORDER — ACETAMINOPHEN 650 MG RE SUPP
650.0000 mg | Freq: Four times a day (QID) | RECTAL | Status: DC | PRN
Start: 2021-04-17 — End: 2021-04-17

## 2021-04-17 MED ORDER — INSULIN ASPART 100 UNIT/ML ~~LOC~~ SOLN: 50.0000 [IU] | SUBCUTANEOUS | Status: DC

## 2021-04-17 MED ORDER — TRAZODONE HCL 50 MG PO TABS: 25.0000 mg | ORAL_TABLET | Freq: Every evening | ORAL | Status: DC | PRN

## 2021-04-17 MED ORDER — INSULIN ASPART 100 UNIT/ML IJ SOLN
0.0000 [IU] | Freq: Every day | INTRAMUSCULAR | Status: DC
Start: 2021-04-17 — End: 2021-04-17

## 2021-04-17 MED ORDER — SODIUM CHLORIDE 0.9 % IV SOLN: INTRAVENOUS | Status: DC

## 2021-04-17 MED ORDER — INSULIN ASPART 100 UNIT/ML IJ SOLN
0.0000 [IU] | Freq: Three times a day (TID) | INTRAMUSCULAR | Status: DC
  Administered 2021-04-17: 2 [IU] via SUBCUTANEOUS

## 2021-04-17 MED ORDER — SODIUM CHLORIDE 0.9 % IV BOLUS
500.0000 mL | Freq: Once | INTRAVENOUS | Status: AC
  Administered 2021-04-17: 500 mL via INTRAVENOUS

## 2021-04-17 MED ORDER — MELATONIN 3 MG PO TABS: 6.0000 mg | ORAL_TABLET | Freq: Every day | ORAL | Status: DC

## 2021-04-17 MED ORDER — ACETAMINOPHEN 325 MG PO TABS: 650.0000 mg | ORAL_TABLET | Freq: Four times a day (QID) | ORAL | Status: DC | PRN

## 2021-04-17 MED ORDER — LEVOTHYROXINE SODIUM 25 MCG PO TABS
50.0000 ug | ORAL_TABLET | Freq: Every day | ORAL | Status: DC
  Administered 2021-04-17: 50 ug via ORAL
  Filled 2021-04-17: qty 2

## 2021-04-17 MED ORDER — INSULIN PUMP
Freq: Three times a day (TID) | SUBCUTANEOUS | Status: DC
  Filled 2021-04-17: qty 1

## 2021-04-17 NOTE — ED Notes (Signed)
Pt was given 2 apple juice containers after her scheduled  CBG check was 79.

## 2021-04-17 NOTE — ED Notes (Signed)
Pt self catheterizes. Pt able to ambulate to restroom and back. Reports some dizziness.

## 2021-04-17 NOTE — ED Notes (Signed)
This RN attempted twice to start IV - unsuccessful. IV team order to be put in.  Additionally - pt notified this RN that she does not have insulin pump with her so she is unable to give herself bolus. This RN notified Dr. Tonie Griffith of this information - orders to be changed.

## 2021-04-17 NOTE — ED Notes (Addendum)
MD aware of CBG, no coverage ordered at present

## 2021-04-17 NOTE — ED Notes (Signed)
Pt dressed herself & removed her IV, then came to the desk to say inform staff she was leaving. She stated concerns for not wanting to worry staff for what she needs & that she does not do things the way they are going here. Another RN had pt appeased with the plan to speak with a doctor before she leaves, pt agreed. This RN asked secretary to page her admitting provider to call. This RN checked pt's sugar & gave her apple juice since it was 79. While in the room with this RN spoke with pt & pt relayed that she has treated herself for years, she knows she is sick, but feels like she can better care for herself at home.

## 2021-04-17 NOTE — ED Notes (Signed)
Attempted to call repot to 6N, accepting RN in pt room, will call back soon.

## 2021-04-17 NOTE — ED Provider Notes (Signed)
[facility] EMERGENCY DEPARTMENT Provider Note   CSN: [id number] Arrival date & time: 04/16/21  1904     History Chief Complaint  Patient presents with  . Dizziness  . Diarrhea    Mary Erickson is a 55 y.o. female.  55 yo F with chief complaints of fatigue.  Patient states she has had watery diarrhea for the past couple weeks.  Has a history of IBS since this was typical for her.  Has been having problems with her neck and had a cortisone injection earlier in the week.  She had some worsening pain last night and took a tramadol and Aleve.  This morning she woke up and felt even worse.  Has been very fatigued today and had trouble getting up and moving around.  She says if she thinks really hard she has trouble to figure out what is going on but denies overt confusion.  Denies seizures.  She has been drinking lots of water to try and replenish her losses.  The history is provided by the patient.  Dizziness Associated symptoms: diarrhea   Associated symptoms: no chest pain, no headaches, no nausea, no palpitations, no shortness of breath and no vomiting   Diarrhea Associated symptoms: no abdominal pain, no arthralgias, no chills, no fever, no headaches, no myalgias and no vomiting   Illness Severity:  Moderate Onset quality:  Gradual Duration:  2 days Timing:  Constant Progression:  Worsening Chronicity:  New Associated symptoms: diarrhea   Associated symptoms: no abdominal pain, no chest pain, no congestion, no fever, no headaches, no myalgias, no nausea, no rhinorrhea, no shortness of breath, no vomiting and no wheezing        Past Medical History:  Diagnosis Date  . Acid reflux   . Anemia   . Arthritis    "elbows, hands, neck, shoulders" (10/02/2016)  . Calcium blood increased   . Chronic neck pain   . Chronic UTI (urinary tract infection)    "from self caths" (10/02/2016)  . Hepatitis B    acute hepatitis B from lancet  . History of  alcoholism ([place])    10/02/2016 "sober since 03/14/2005"  . History of blood transfusion 1984; 1989; 2008   "quite a few"  . History of shortness of breath   . Hypercholesteremia   . Hypothyroidism   . Internal carotid aneurysm dx'd early 2017  . Legally blind in left eye, as defined in [place]   . Melanoma ([place])    left upper arm  . Migraines    "quite a few in a month; at least 15" (10/02/2016)  . Neuropathy    neck; "not sure if it's from DM or Sjogren's" (10/02/2016)  . Pneumonia 1986   "1 yr after major OR when I was under anesthesia for 13 hr"  . Seizures ([place]) ~ 2004; ~ 2013   "seizures from diabetes "  . Self-catheterizes urinary bladder    "since OR in 1984" (10/02/2016)  . Sjogren's syndrome ([place])   . Thyroid goiter    "still present" (10/02/2016)  . Type II diabetes mellitus ([place])   . Urinary retention    self caths  . Wears glasses     Patient Active Problem List   Diagnosis Date Noted  . Hyponatremia 04/17/2021  . IBS (irritable bowel syndrome) 04/17/2021  . Dizziness 12/11/2019  . Colitis 12/11/2019  . Abnormal CT scan, colon   . Acute colitis 12/08/2019  . Orthostatic hypotension 12/08/2019  . Type 1 diabetes  mellitus ([place]) 06/30/2019  . Hashimoto's disease 06/30/2019  . Sjogren's syndrome ([place]) 06/30/2019  . Hx of Hypertension 06/30/2019  . Neurogenic bladder 06/30/2019  . Malignant melanoma of left upper arm ([place]) 10/02/2016    Past Surgical History:  Procedure Laterality Date  . ABDOMINAL HYSTERECTOMY  2008  . ECTOPIC PREGNANCY SURGERY  1989  . EXCISION MELANOMA WITH SENTINEL LYMPH NODE BIOPSY Left 10/02/2016   Procedure: WIDE LOCAL EXCISION AND ADVANCEMENT FLAP CLOSURE LEFT UPPER  MELANOMA WITH SENTINEL LYMPH NODE MAPPING AND BIOPSY;  Surgeon: [name], MD;  Location: [facility];  Service: General;  Laterality: Left;  . EYE SURGERY    . LAPAROSCOPIC CHOLECYSTECTOMY  ~ 01/2016  . MELANOMA EXCISION WITH SENTINEL LYMPH NODE BIOPSY Left 10/02/2016   WIDE  LOCAL EXCISION AND ADVANCEMENT FLAP CLOSURE LEFT UPPER  MELANOMA WITH SENTINEL LYMPH NODE MAPPING AND BIOPSY  . SHOULDER ARTHROSCOPY W/ ROTATOR CUFF REPAIR Right 2014   "partial"  . STRABISMUS SURGERY Left 1971  . STRABISMUS SURGERY Left 03/18/2020   Procedure: STRABISMUS REPAIR;  Surgeon: Everitt Amber, MD;  Location: [facility];  Service: Ophthalmology;  Laterality: Left;  . TUMOR EXCISION  1984   from tail bone and part of tail bone removed     OB History   No obstetric history on file.     History reviewed. No pertinent family history.  Social History   Tobacco Use  . Smoking status: Former Smoker    Packs/day: 1.50    Years: 24.00    Pack years: 36.00    Types: Cigarettes    Quit date: 2008    Years since quitting: 14.4  . Smokeless tobacco: Never Used  Substance Use Topics  . Alcohol use: No    Comment: 10/02/2016 "sober since 03/14/2005"  . Drug use: Yes    Types: Marijuana    Comment: 10/02/2016 " occasional; nothing since before 2006"    Home Medications Prior to Admission medications   Medication Sig Start Date End Date Taking? Authorizing Provider  acetaminophen (TYLENOL) 325 MG tablet Take 650 mg by mouth every 6 (six) hours as needed for mild pain.    [provider]  Ascorbic Acid (VITAMIN C) 1000 MG tablet Take 1,000 mg by mouth daily.    [provider]  Cholecalciferol (D3-1000) 25 MCG (1000 UT) capsule Take 1,000 Units by mouth daily.     [provider]  Cyanocobalamin (B-12) 2500 MCG TABS Take 2,500 mcg by mouth daily.     [provider]  cycloSPORINE (RESTASIS) 0.05 % ophthalmic emulsion Place 1 drop into both eyes 2 times daily. 02/11/20   [provider]  diclofenac Sodium (VOLTAREN) 1 % GEL Apply 2 g topically 4 (four) times daily. 12/17/19   Darr, Edison Nasuti, PA-C  insulin aspart (NOVOLOG) 100 UNIT/ML injection Inject 50-60 Units into the skin See admin instructions. Insulin pump basil rate 50-60  units throughout day. Additional 0-5 units per bolos.    [provider]  Insulin Detemir (LEVEMIR FLEXTOUCH) 100 UNIT/ML Pen Inject 4 Units into the skin 2 (two) times daily as needed (in case pump breaks).  09/27/17   [provider]  Insulin Human (INSULIN PUMP) SOLN Inject into the skin.    [provider]  Levothyroxine Sodium 50 MCG CAPS Take 50 mcg by mouth daily. 11/10/19   [provider]  Magnesium 400 MG CAPS Take 400 mg by mouth daily.    [provider]  melatonin 1 MG TABS  tablet Take 6 mg by mouth at bedtime.    [provider]  Multiple Vitamins-Minerals (MULTIVITAMINS THER. W/MINERALS) TABS Take 1 tablet by mouth daily.    [provider]  Naphazoline HCl (CLEAR EYES OP) Place 1 drop into both eyes daily.    [provider]  naproxen sodium (ALEVE) 220 MG tablet Take 220 mg by mouth as needed (pain).    [provider]  omeprazole (PRILOSEC) 40 MG capsule Take 40 mg by mouth daily. 05/04/19   [provider]  ondansetron (ZOFRAN ODT) 4 MG disintegrating tablet Take 1 tablet (4 mg total) by mouth every 8 (eight) hours as needed for nausea or vomiting. 12/07/19   Sherwood Gambler, MD  selenium 50 MCG TABS tablet Take 200 mcg by mouth daily.    [provider]  vitamin E (VITAMIN E) 400 UNIT capsule Take 400 Units by mouth daily.    [provider]  Zinc 50 MG TABS Take 50 mg by mouth daily.     [provider]    Allergies    Doxycycline, Aspirin, Ciprofloxacin, Nsaids, Amoxicillin-pot clavulanate, Codeine, Hydrocodone-acetaminophen, Oxycodone, Sulfa antibiotics, and Sulfasalazine  Review of Systems   Review of Systems  Constitutional: Negative for chills and fever.  HENT: Negative for congestion and rhinorrhea.   Eyes: Negative for redness and visual disturbance.  Respiratory: Negative for shortness of breath and wheezing.   Cardiovascular: Negative for chest pain  and palpitations.  Gastrointestinal: Positive for diarrhea. Negative for abdominal pain, nausea and vomiting.  Genitourinary: Negative for dysuria and urgency.  Musculoskeletal: Positive for neck pain. Negative for arthralgias and myalgias.  Skin: Negative for pallor and wound.  Neurological: Positive for dizziness. Negative for headaches.    Physical Exam Updated Vital Signs BP 132/75 (BP Location: Right Arm)   Pulse 71   Temp 98.7 F (37.1 C) (Oral)   Resp 12   Ht 5\' 8"  (1.727 m)   Wt 65.8 kg   SpO2 100%   BMI 22.05 kg/m   Physical Exam Vitals and nursing note reviewed.  Constitutional:      General: She is not in acute distress.    Appearance: She is well-developed. She is not diaphoretic.  HENT:     Head: Normocephalic and atraumatic.  Eyes:     Pupils: Pupils are equal, round, and reactive to light.  Cardiovascular:     Rate and Rhythm: Normal rate and regular rhythm.     Heart sounds: No murmur heard. No friction rub. No gallop.   Pulmonary:     Effort: Pulmonary effort is normal.     Breath sounds: No wheezing or rales.  Abdominal:     General: There is no distension.     Palpations: Abdomen is soft.     Tenderness: There is no abdominal tenderness.  Musculoskeletal:        General: No tenderness.     Cervical back: Normal range of motion and neck supple.  Skin:    General: Skin is warm and dry.  Neurological:     Mental Status: She is alert and oriented to person, place, and time.  Psychiatric:        Behavior: Behavior normal.     ED Results / Procedures / Treatments   Labs (all labs ordered are listed, but only abnormal results are displayed) Labs Reviewed  CBC WITH DIFFERENTIAL/PLATELET - Abnormal; Notable for the following components:      Result Value   RBC 3.62 (*)  Hemoglobin 11.4 (*)    HCT 33.9 (*)    Monocytes Absolute 1.1 (*)    All other components within normal limits  COMPREHENSIVE METABOLIC PANEL - Abnormal; Notable for the  following components:   Sodium 122 (*)    Chloride 89 (*)    Glucose, Bld 163 (*)    Calcium 8.3 (*)    Total Protein 6.0 (*)    Albumin 3.3 (*)    Total Bilirubin 1.3 (*)    All other components within normal limits  CBG MONITORING, ED - Abnormal; Notable for the following components:   Glucose-Capillary 163 (*)    All other components within normal limits  RESP PANEL BY RT-PCR (FLU A&B, COVID) ARPGX2  LIPASE, BLOOD  CBC  URINALYSIS, ROUTINE W REFLEX MICROSCOPIC  SODIUM, URINE, RANDOM  OSMOLALITY  HIV ANTIBODY (ROUTINE TESTING W REFLEX)  TSH  BASIC METABOLIC PANEL  HEMOGLOBIN A1C  TROPONIN I (HIGH SENSITIVITY)  TROPONIN I (HIGH SENSITIVITY)    EKG EKG Interpretation  Date/Time:  Sunday April 16 2021 19:32:40 EDT Ventricular Rate:  59 PR Interval:  182 QRS Duration: 90 QT Interval:  392 QTC Calculation: 388 R Axis:   61 Text Interpretation: Sinus bradycardia Otherwise normal ECG Otherwise no significant change Confirmed by Cena Bruhn ([id number]) on 04/17/2021 12:28:01 AM   Radiology DG Chest 2 View  Result Date: 04/16/2021 CLINICAL DATA:  Left shoulder pain and weakness. EXAM: CHEST - 2 VIEW COMPARISON:  None. FINDINGS: The heart size and mediastinal contours are within normal limits. Both lungs are clear. Radiopaque surgical clips are seen overlying the left axilla. The visualized skeletal structures are unremarkable. IMPRESSION: No active cardiopulmonary disease. Electronically Signed   By: Thaddeus  Houston M.D.   On: 04/16/2021 20:30    Procedures Procedures   Medications Ordered in ED Medications  sodium chloride 0.9 % bolus 500 mL (has no administration in time range)  sodium chloride 0.9 % bolus 500 mL (has no administration in time range)  levothyroxine (SYNTHROID) tablet 50 mcg (has no administration in time range)  melatonin tablet 6 mg (has no administration in time range)  0.9 %  sodium chloride infusion (has no administration in time range)  acetaminophen  (TYLENOL) tablet 650 mg (has no administration in time range)    Or  acetaminophen (TYLENOL) suppository 650 mg (has no administration in time range)  traZODone (DESYREL) tablet 25 mg (has no administration in time range)  insulin aspart (novoLOG) injection 0-9 Units (has no administration in time range)  insulin aspart (novoLOG) injection 0-5 Units (has no administration in time range)  insulin detemir (LEVEMIR) injection 10 Units (has no administration in time range)    ED Course  I have reviewed the triage vital signs and the nursing notes.  Pertinent labs & imaging results that were available during my care of the patient were reviewed by me and considered in my medical decision making (see chart for details).    MDM Rules/Calculators/A&P                          55  yo F with a chief complaints of feeling fatigued after 2 weeks of watery diarrhea.  On exam the patient does appear clinically dehydrated.  She had blood work performed in the MSE process and was found to have a sodium of 122.  Much below her baseline.  We will check osmolality and urine sodium.  We will give a small bolus of IV  fluids.  Will discuss with the hospitalist for admission.  CRITICAL CARE Performed by: Cecilio Asper   Total critical care time: 35 minutes  Critical care time was exclusive of separately billable procedures and treating other patients.  Critical care was necessary to treat or prevent imminent or life-threatening deterioration.  Critical care was time spent personally by me on the following activities: development of treatment plan with patient and/or surrogate as well as nursing, discussions with consultants, evaluation of patient's response to treatment, examination of patient, obtaining history from patient or surrogate, ordering and performing treatments and interventions, ordering and review of laboratory studies, ordering and review of radiographic studies, pulse oximetry and  re-evaluation of patient's condition.  The patients results and plan were reviewed and discussed.   Any x-rays performed were independently reviewed by myself.   Differential diagnosis were considered with the presenting HPI.  Medications  sodium chloride 0.9 % bolus 500 mL (has no administration in time range)  sodium chloride 0.9 % bolus 500 mL (has no administration in time range)  levothyroxine (SYNTHROID) tablet 50 mcg (has no administration in time range)  melatonin tablet 6 mg (has no administration in time range)  0.9 %  sodium chloride infusion (has no administration in time range)  acetaminophen (TYLENOL) tablet 650 mg (has no administration in time range)    Or  acetaminophen (TYLENOL) suppository 650 mg (has no administration in time range)  traZODone (DESYREL) tablet 25 mg (has no administration in time range)  insulin aspart (novoLOG) injection 0-9 Units (has no administration in time range)  insulin aspart (novoLOG) injection 0-5 Units (has no administration in time range)  insulin detemir (LEVEMIR) injection 10 Units (has no administration in time range)    Vitals:   04/16/21 1909 04/16/21 2220 04/17/21 0101  BP: (!) 142/79 129/67 132/75  Pulse: 68 65 71  Resp: 18 18 12   Temp: 98.7 F (37.1 C)    TempSrc: Oral    SpO2: 100% 100% 100%  Weight:   65.8 kg  Height:   5\' 8"  (1.727 m)    Final diagnoses:  Hyponatremia  Diarrhea, unspecified type  Dehydration    Admission/ observation were discussed with the admitting physician, patient and/or family and they are comfortable with the plan.   Final Clinical Impression(s) / ED Diagnoses Final diagnoses:  Hyponatremia  Diarrhea, unspecified type  Dehydration    Rx / DC Orders ED Discharge Orders    None       Deno Etienne, DO 04/17/21 [phone]

## 2021-04-17 NOTE — ED Notes (Signed)
Pt was explained by this RN how the insulin coverage is ordered AC HS while here at [facility]. She acknowledged understanding after this RN told her when she would next get her sugar checked unless she begins to become symptomatic.

## 2021-04-17 NOTE — ED Notes (Signed)
Pt decided to leave after she drank her juice, she signed the AMA form on her way out.

## 2021-04-17 NOTE — H&P (Addendum)
History and Physical    Mary Erickson AJG:[id number] DOB: February 07, 1966 DOA: 04/16/2021  PCP: Nicola Girt, DO   Patient coming from: Home  Chief Complaint: Diarrhea, dizziness, fatigue  HPI: Mary Erickson is a 55 y.o. female with medical history significant for DMT1 on insulin pump, Hashimoto's thyroiditis, Sjogren's syndrome, IBS who presents for evaluation of fatigue, dizziness and two weeks of watery diarrhea.  States she has not had any abdominal pain and no blood or mucus in the diarrhea.  She has not had any fevers or chills and she denies any change in her diet or any recent raw or undercooked meats.  She has not had any recent travel or exposures to new plants, chemicals or pets.  She does have chronic neck pain due to a degenerative disc and she received a cortisone injection last week.  Last night she had increased pain in her neck so she took an Aleve and tramadol and when she woke up this morning she states that she felt worse and had very little energy and was dizzy when she stood up.  She states the room was not spinning around her but she has felt weak and off balance and had to hold onto the wall when she walks so she would not fall.  She has not had any loss of consciousness.  She has not had any confusion but feels like her mind has been working slower than normal.  She has never had any seizures.  She reports that with the diarrhea the last few weeks she was concerned about becoming dehydrated so she was drinking a large volume of water to replenish fluids in her body.   ED Course:  Ms. Tidd has been hemodynamically stable while in the emergency room.  Work-up significant for hyponatremia with a sodium of 122.  CBC is unremarkable.  Troponin was negative at 3 twice.  Potassium 3.7, chloride 89, bicarb 25, glucose 163, creatinine 0.65, BUN 9, calcium 8.3, lipase 21, AST 38, ALT 37, alkaline phosphatase 83.  Urine sodium and serum osmolality labs have been ordered.   Hospitalist service is asked to admit for further management  Review of Systems:  General: Reports fatigue. Denies fever, chills, weight loss, night sweats. Denies change in appetite HENT: Denies head trauma, headache, denies change in hearing, tinnitus. Denies nasal bleeding. Denies sore throat, sores in mouth.  Denies difficulty swallowing Eyes: Denies blurry vision, pain in eye, drainage. Denies discoloration of eyes. Neck: Denies pain.  Denies swelling. Denies pain with movement. Cardiovascular: Denies chest pain, palpitations. Denies edema. Denies orthopnea Respiratory: Denies shortness of breath, cough. Denies wheezing. Denies sputum production Gastrointestinal: Denies abdominal pain, swelling. Reports diarrhea. Reports nausea but no vomiting. Denies melena. Denies hematemesis. Musculoskeletal: Denies limitation of movement.  Denies deformity or swelling. Denies myalgias. Genitourinary: Denies pelvic pain.  Denies urinary frequency or hesitancy.  Denies dysuria.  Skin: Denies rash.  Denies petechiae, purpura, ecchymosis. Neurological: Denies syncope. Denies seizure activity. Denies paresthesia. Denies slurred speech, drooping face. Denies visual change. Psychiatric: Denies depression, anxiety. Denies hallucinations.  Past Medical History:  Diagnosis Date  . Acid reflux   . Anemia   . Arthritis    "elbows, hands, neck, shoulders" (10/02/2016)  . Calcium blood increased   . Chronic neck pain   . Chronic UTI (urinary tract infection)    "from self caths" (10/02/2016)  . Hepatitis B    acute hepatitis B from lancet  . History of alcoholism ([place])    10/02/2016 "  sober since 03/14/2005"  . History of blood transfusion 1984; 1989; 2008   "quite a few"  . History of shortness of breath   . Hypercholesteremia   . Hypothyroidism   . Internal carotid aneurysm dx'd early 2017  . Legally blind in left eye, as defined in [place]   . Melanoma ([place])    left upper arm  . Migraines    "quite a  few in a month; at least 15" (10/02/2016)  . Neuropathy    neck; "not sure if it's from DM or Sjogren's" (10/02/2016)  . Pneumonia 1986   "1 yr after major OR when I was under anesthesia for 13 hr"  . Seizures ([place]) ~ 2004; ~ 2013   "seizures from diabetes "  . Self-catheterizes urinary bladder    "since OR in 1984" (10/02/2016)  . Sjogren's syndrome ([place])   . Thyroid goiter    "still present" (10/02/2016)  . Type II diabetes mellitus ([place])   . Urinary retention    self caths  . Wears glasses     Past Surgical History:  Procedure Laterality Date  . ABDOMINAL HYSTERECTOMY  2008  . ECTOPIC PREGNANCY SURGERY  1989  . EXCISION MELANOMA WITH SENTINEL LYMPH NODE BIOPSY Left 10/02/2016   Procedure: WIDE LOCAL EXCISION AND ADVANCEMENT FLAP CLOSURE LEFT UPPER  MELANOMA WITH SENTINEL LYMPH NODE MAPPING AND BIOPSY;  Surgeon: [name], MD;  Location: [facility];  Service: General;  Laterality: Left;  . EYE SURGERY    . LAPAROSCOPIC CHOLECYSTECTOMY  ~ 01/2016  . MELANOMA EXCISION WITH SENTINEL LYMPH NODE BIOPSY Left 10/02/2016   WIDE LOCAL EXCISION AND ADVANCEMENT FLAP CLOSURE LEFT UPPER  MELANOMA WITH SENTINEL LYMPH NODE MAPPING AND BIOPSY  . SHOULDER ARTHROSCOPY W/ ROTATOR CUFF REPAIR Right 2014   "partial"  . STRABISMUS SURGERY Left 1971  . STRABISMUS SURGERY Left 03/18/2020   Procedure: STRABISMUS REPAIR;  Surgeon: Everitt Amber, MD;  Location: [facility];  Service: Ophthalmology;  Laterality: Left;  . TUMOR EXCISION  1984   from tail bone and part of tail bone removed    Social History  reports that she quit smoking about 14 years ago. Her smoking use included cigarettes. She has a 36.00 pack-year smoking history. She has never used smokeless tobacco. She reports current drug use. Drug: Marijuana. She reports that she does not drink alcohol.  Allergies  Allergen Reactions  . Doxycycline Other (See Comments)    Elevates liver enzymes   . Aspirin Other (See Comments)     Can take 81 mg but has to be coated; No other Aspirin due to bleeding in stools from ulcers  . Ciprofloxacin Hives    Per pt, she can tolerate oral but not IV  . Nsaids Other (See Comments)    Causes kidney and skin problems Kidney and skin problems  . Amoxicillin-Pot Clavulanate Nausea Only  . Codeine Nausea And Vomiting  . Hydrocodone-Acetaminophen Nausea And Vomiting  . Oxycodone Nausea And Vomiting  . Sulfa Antibiotics Nausea And Vomiting  . Sulfasalazine Nausea And Vomiting    History reviewed. No pertinent family history.   Prior to Admission medications   Medication Sig Start Date End Date Taking? Authorizing Provider  acetaminophen (TYLENOL) 325 MG tablet Take 650 mg by mouth every 6 (six) hours as needed for mild pain.    [provider]  Ascorbic Acid (VITAMIN C) 1000 MG tablet Take 1,000 mg by mouth daily.    [provider]  Cholecalciferol (D3-1000) 25  MCG (1000 UT) capsule Take 1,000 Units by mouth daily.     [provider]  Cyanocobalamin (B-12) 2500 MCG TABS Take 2,500 mcg by mouth daily.     [provider]  cycloSPORINE (RESTASIS) 0.05 % ophthalmic emulsion Place 1 drop into both eyes 2 times daily. 02/11/20   [provider]  diclofenac Sodium (VOLTAREN) 1 % GEL Apply 2 g topically 4 (four) times daily. 12/17/19   Darr, Edison Nasuti, PA-C  insulin aspart (NOVOLOG) 100 UNIT/ML injection Inject 50-60 Units into the skin See admin instructions. Insulin pump basil rate 50-60 units throughout day. Additional 0-5 units per bolos.    [provider]  Insulin Detemir (LEVEMIR FLEXTOUCH) 100 UNIT/ML Pen Inject 4 Units into the skin 2 (two) times daily as needed (in case pump breaks).  09/27/17   [provider]  Insulin Human (INSULIN PUMP) SOLN Inject into the skin.    [provider]  Levothyroxine Sodium 50 MCG CAPS Take 50 mcg by mouth daily. 11/10/19   [provider]  Magnesium 400 MG CAPS Take 400 mg  by mouth daily.    [provider]  melatonin 1 MG TABS tablet Take 6 mg by mouth at bedtime.    [provider]  Multiple Vitamins-Minerals (MULTIVITAMINS THER. W/MINERALS) TABS Take 1 tablet by mouth daily.    [provider]  Naphazoline HCl (CLEAR EYES OP) Place 1 drop into both eyes daily.    [provider]  naproxen sodium (ALEVE) 220 MG tablet Take 220 mg by mouth as needed (pain).    [provider]  omeprazole (PRILOSEC) 40 MG capsule Take 40 mg by mouth daily. 05/04/19   [provider]  ondansetron (ZOFRAN ODT) 4 MG disintegrating tablet Take 1 tablet (4 mg total) by mouth every 8 (eight) hours as needed for nausea or vomiting. 12/07/19   Sherwood Gambler, MD  selenium 50 MCG TABS tablet Take 200 mcg by mouth daily.    [provider]  vitamin E (VITAMIN E) 400 UNIT capsule Take 400 Units by mouth daily.    [provider]  Zinc 50 MG TABS Take 50 mg by mouth daily.     [provider]    Physical Exam: Vitals:   04/16/21 1909 04/16/21 2220 04/17/21 0101  BP: (!) 142/79 129/67 132/75  Pulse: 68 65 71  Resp: 18 18 12   Temp: 98.7 F (37.1 C)    TempSrc: Oral    SpO2: 100% 100% 100%  Weight:   65.8 kg  Height:   5\' 8"  (1.727 m)    Constitutional: NAD, calm, comfortable Vitals:   04/16/21 1909 04/16/21 2220 04/17/21 0101  BP: (!) 142/79 129/67 132/75  Pulse: 68 65 71  Resp: 18 18 12   Temp: 98.7 F (37.1 C)    TempSrc: Oral    SpO2: 100% 100% 100%  Weight:   65.8 kg  Height:   5\' 8"  (1.727 m)   General: WDWN, Alert and oriented x3.  Eyes: EOMI, PERRL, conjunctivae normal. Sclera nonicteric HENT:  [place]/AT, external ears normal.  Nares patent without epistasis.  Mucous membranes are dry  Neck: Soft, normal range of motion, supple, no masses, no thyromegaly. Trachea midline Respiratory: clear to auscultation bilaterally, no wheezing, no crackles. Normal respiratory effort. No accessory muscle  use.  Cardiovascular: Regular rate and rhythm, no murmurs / rubs / gallops. Trace pedal edema. 2+ pedal pulses Abdomen: Soft, no tenderness, nondistended, no rebound or guarding.  No masses  palpated. No hepatosplenomegaly. Bowel sounds normoactive Musculoskeletal: FROM. no cyanosis. No joint deformity upper and lower extremities. Normal muscle tone.  Skin: Warm, dry, intact no rashes, lesions, ulcers. No induration Neurologic: CN 2-12 grossly intact.  Normal speech. Sensation intact, Strength 5/5 in all extremities.   Psychiatric: Normal judgment and insight.  Normal mood.    Labs on Admission: I have personally reviewed following labs and imaging studies  CBC: Recent Labs  Lab 04/16/21 1923  WBC 8.3  NEUTROABS 4.9  HGB 11.4*  HCT 33.9*  MCV 93.6  PLT 242    Basic Metabolic Panel: Recent Labs  Lab 04/16/21 1923  NA 122*  K 3.7  CL 89*  CO2 25  GLUCOSE 163*  BUN 9  CREATININE 0.65  CALCIUM 8.3*    GFR: Estimated Creatinine Clearance: 80.2 mL/min (by C-G formula based on SCr of 0.65 mg/dL).  Liver Function Tests: Recent Labs  Lab 04/16/21 1923  AST 38  ALT 37  ALKPHOS 83  BILITOT 1.3*  PROT 6.0*  ALBUMIN 3.3*    Urine analysis:    Component Value Date/Time   COLORURINE STRAW (A) 12/10/2019 2000   APPEARANCEUR CLEAR 12/10/2019 2000   LABSPEC <=1.005 08/05/2020 1001   PHURINE 5.0 08/05/2020 1001   GLUCOSEU NEGATIVE 08/05/2020 1001   HGBUR NEGATIVE 08/05/2020 1001   BILIRUBINUR NEGATIVE 08/05/2020 1001   KETONESUR NEGATIVE 08/05/2020 1001   PROTEINUR NEGATIVE 08/05/2020 1001   UROBILINOGEN 0.2 08/05/2020 1001   NITRITE NEGATIVE 08/05/2020 1001   LEUKOCYTESUR NEGATIVE 08/05/2020 1001    Radiological Exams on Admission: DG Chest 2 View  Result Date: 04/16/2021 CLINICAL DATA:  Left shoulder pain and weakness. EXAM: CHEST - 2 VIEW COMPARISON:  None. FINDINGS: The heart size and mediastinal contours are within normal limits. Both lungs are clear.  Radiopaque surgical clips are seen overlying the left axilla. The visualized skeletal structures are unremarkable. IMPRESSION: No active cardiopulmonary disease. Electronically Signed   By: Virgina Norfolk M.D.   On: 04/16/2021 20:30    EKG: Independently reviewed.  EKG shows sinus bradycardia with no acute ST elevation or depression.  QTc 388  Assessment/Plan Principal Problem:   Hyponatremia Ms. Canino is admitted to Med/Surg floor.  She will be given bolus of 1000 ml NS at 500 ml/hr then will continue NS infusion at 75 ml/hr.  Recheck electrolytes and renal function in am.  Check urine sodium level.  Check serum osmolarity.  Seizure precautions while sodium is being corrected with level being close to 120.   Active Problems:   Type 1 diabetes mellitus Diabetic diet.  She does not have her insulin pump with her so will use levemir 10 units bid for basal insulin and SSI as needed. If she gets her pump then will convert to insulin pump as she normally uses at home Check HgbA1c Monitor blood sugar with meals and bedtime. SSI provided as needed for glycemic control.     IBS (irritable bowel syndrome) Having persistent diarrhea past few weeks.  IVF hydration.     Hashimoto's disease Check TSH. Continue levothyroxine and will adjust dose if needed.     Sjogren's syndrome  Chronic    Neurogenic bladder Pt self-catheterizes to empty bladder.    DVT prophylaxis: Padua score low. Early ambulation for DVT prophylaxis.  Code Status:   Full Code  Family Communication:  Diagnosis and plan discussed with patient.  She verbalizes understanding and agrees with plan.  Further recommendations to follow as clinically indicated Disposition Plan:  Patient is from:  Home  Anticipated DC to:  Home  Anticipated DC date:  Anticipate 2 midnight or more stay in the hospital  Anticipated DC barriers: No barriers to discharge identified at this time  Admission status:  Inpatient  Yevonne Aline  Tyler Cubit MD [facility]  How to contact the [facility] Attending or Consulting provider [place] or covering provider during after hours [place], for this patient?   1. Check the care team in [facility] and look for a) attending/consulting [facility] provider listed and b) the [facility] team listed 2. Log into [email or web address] and use [place]'s universal password to access. If you do not have the password, please contact the hospital operator. 3. Locate the [facility] provider you are looking for under [facility] and page to a number that you can be directly reached. 4. If you still have difficulty reaching the provider, please page the [facility] (Director on Call) for the Hospitalists listed on amion for assistance.  04/17/2021, 2:09 AM

## 2021-04-17 NOTE — Progress Notes (Signed)
[facility] PROGRESS NOTE    Mary Erickson  OHY:[id number] DOB: 1965/11/16 DOA: 04/16/2021 PCP: Nicola Girt, DO      Brief Narrative:   55 years old female patient with history of diabetes type 2 on insulin pump, Hashimoto's thyroiditis, Sjogren's syndrome and IBS was admitted on 04/17/2021 after she presented with dizziness.    Past Medical History:  Diagnosis Date  . Acid reflux   . Anemia   . Arthritis    "elbows, hands, neck, shoulders" (10/02/2016)  . Calcium blood increased   . Chronic neck pain   . Chronic UTI (urinary tract infection)    "from self caths" (10/02/2016)  . Hepatitis B    acute hepatitis B from lancet  . History of alcoholism ([place])    10/02/2016 "sober since 03/14/2005"  . History of blood transfusion 1984; 1989; 2008   "quite a few"  . History of shortness of breath   . Hypercholesteremia   . Hypothyroidism   . Internal carotid aneurysm dx'd early 2017  . Legally blind in left eye, as defined in [place]   . Melanoma ([place])    left upper arm  . Migraines    "quite a few in a month; at least 15" (10/02/2016)  . Neuropathy    neck; "not sure if it's from DM or Sjogren's" (10/02/2016)  . Pneumonia 1986   "1 yr after major OR when I was under anesthesia for 13 hr"  . Seizures ([place]) ~ 2004; ~ 2013   "seizures from diabetes "  . Self-catheterizes urinary bladder    "since OR in 1984" (10/02/2016)  . Sjogren's syndrome ([place])   . Thyroid goiter    "still present" (10/02/2016)  . Type II diabetes mellitus ([place])   . Urinary retention    self caths  . Wears glasses       Assessment & Plan:  Hyponatremia Improving with IV normal saline.  Continue with IV hydration.  Monitor BMP.  Diabetes. Continue with current insulin dose.  IBS. Does report history of intermittent diarrhea over the last several weeks.  Hashimoto's disease. Continue levothyroxine.  Sjogren's syndrome, neurogenic bladder.  Stable.           . insulin aspart  0-5 Units Subcutaneous QHS  . insulin aspart  0-9 Units Subcutaneous TID WC  . insulin detemir  10 Units Subcutaneous BID  . levothyroxine  50 mcg Oral Daily  . melatonin  6 mg Oral QHS     Current Meds  Medication Sig  . Ascorbic Acid (VITAMIN C) 1000 MG tablet Take 1,000 mg by mouth daily.  . Cholecalciferol (D3-1000) 25 MCG (1000 UT) capsule Take 1,000 Units by mouth daily.   . Cyanocobalamin (B-12) 2500 MCG TABS Take 2,500 mcg by mouth daily.   . cycloSPORINE (RESTASIS) 0.05 % ophthalmic emulsion Place 1 drop into both eyes 2 (two) times daily.  . diclofenac Sodium (VOLTAREN) 1 % GEL Apply 2 g topically 4 (four) times daily.  Marland Kitchen estrogens, conjugated, (PREMARIN) 0.45 MG tablet Take 0.45 mg by mouth daily. Take daily for 21 days then do not take for 7 days.  . insulin aspart (NOVOLOG) 100 UNIT/ML injection Inject 50-60 Units into the skin See admin instructions. Insulin pump basil rate 50-60 units throughout day. Additional 0-5 units per bolos.  . Insulin Detemir (LEVEMIR FLEXTOUCH) 100 UNIT/ML Pen Inject 4 Units into the skin 2 (two) times daily as needed (in case pump breaks).   . Insulin Human (INSULIN  PUMP) SOLN Inject into the skin.  . Levothyroxine Sodium 50 MCG CAPS Take 25-50 mcg by mouth daily. Depending on thyroid levels.  . Magnesium 400 MG CAPS Take 400 mg by mouth daily.  . melatonin 1 MG TABS tablet Take 6 mg by mouth at bedtime.  . Multiple Vitamins-Minerals (MULTIVITAMINS THER. W/MINERALS) TABS Take 1 tablet by mouth daily.  . Naphazoline HCl (CLEAR EYES OP) Place 1 drop into both eyes daily.  Marland Kitchen omeprazole (PRILOSEC) 40 MG capsule Take 40 mg by mouth daily.  . ondansetron (ZOFRAN ODT) 4 MG disintegrating tablet Take 1 tablet (4 mg total) by mouth every 8 (eight) hours as needed for nausea or vomiting.  . predniSONE (DELTASONE) 5 MG tablet Take 5 mg by mouth daily as needed (flare up).  . vitamin E 180 MG (400 UNITS) capsule Take 400 Units by  mouth daily.        Disposition: Status is: Inpatient       MDM: The below labs and imaging reports were reviewed and summarized above.  Medication management as above.     DVT prophylaxis:   Code Status: Full code.     Subjective: Patient reports that she feels better this morning.  Dizziness is better.  No fever or chills.  Objective: Vitals:   04/17/21 1145 04/17/21 1417 04/17/21 1600 04/17/21 1645  BP: 109/75  122/75 134/76  Pulse: 70  69 78  Resp: 20  19 20   Temp:  98.8 F (37.1 C)    TempSrc:  Oral    SpO2: 95%  99% 99%  Weight:      Height:        Intake/Output Summary (Last 24 hours) at 04/17/2021 1716 Last data filed at 04/17/2021 0353 Gross per 24 hour  Intake 0 ml  Output 0 ml  Net 0 ml   Filed Weights   04/17/21 0101  Weight: 65.8 kg    Examination: General: Not in obvious distress, alert awake . HENT:   No scleral pallor or icterus noted. Oral mucosa is moist.  Chest:   Clear to auscultation bilaterally. No crackles or wheezes.  CVS: S1 &S2 heard. No murmur.  Regular rate and rhythm. Abdomen: Soft, nontender, nondistended.  Bowel sounds are normoactive. Extremities: No cyanosis, or edema.   Psych: Alert, awake.  Normal mood and affect CNS:  No cranial nerve deficits.  Power equal in all extremities.   Skin: Warm and dry.  No rashes noted.   Data Reviewed: I have personally reviewed following labs and imaging studies:  CBC: Recent Labs  Lab 04/16/21 1923 04/17/21 0315  WBC 8.3 9.0  NEUTROABS 4.9  --   HGB 11.4* 13.6  HCT 33.9* 39.7  MCV 93.6 91.7  PLT 188 932   Basic Metabolic Panel: Recent Labs  Lab 04/16/21 1923 04/17/21 0315  NA 122* 127*  K 3.7 4.5  CL 89* 93*  CO2 25 27  GLUCOSE 163* 208*  BUN 9 7  CREATININE 0.65 0.71  CALCIUM 8.3* 9.5   GFR: Estimated Creatinine Clearance: 80.2 mL/min (by C-G formula based on SCr of 0.71 mg/dL). Liver Function Tests: Recent Labs  Lab 04/16/21 1923  AST 38  ALT 37   ALKPHOS 83  BILITOT 1.3*  PROT 6.0*  ALBUMIN 3.3*   Recent Labs  Lab 04/16/21 1923  LIPASE 21   No results for input(s): AMMONIA in the last 168 hours. Coagulation Profile: No results for input(s): INR, PROTIME in the last 168 hours. Cardiac Enzymes: No  results for input(s): CKTOTAL, CKMB, CKMBINDEX, TROPONINI in the last 168 hours. BNP (last 3 results) No results for input(s): PROBNP in the last 8760 hours. HbA1C: No results for input(s): HGBA1C in the last 72 hours. CBG: Recent Labs  Lab 04/16/21 1912 04/17/21 0357 04/17/21 0727 04/17/21 1140 04/17/21 1710  GLUCAP 163* 196* 154* 118* 76   Lipid Profile: No results for input(s): CHOL, HDL, LDLCALC, TRIG, CHOLHDL, LDLDIRECT in the last 72 hours. Thyroid Function Tests: Recent Labs    04/17/21 0222  TSH 1.016   Anemia Panel: No results for input(s): VITAMINB12, FOLATE, FERRITIN, TIBC, IRON, RETICCTPCT in the last 72 hours. Urine analysis:    Component Value Date/Time   COLORURINE STRAW (A) 12/10/2019 2000   APPEARANCEUR CLEAR 12/10/2019 2000   LABSPEC <=1.005 08/05/2020 1001   PHURINE 5.0 08/05/2020 1001   GLUCOSEU NEGATIVE 08/05/2020 1001   HGBUR NEGATIVE 08/05/2020 1001   BILIRUBINUR NEGATIVE 08/05/2020 1001   KETONESUR NEGATIVE 08/05/2020 1001   PROTEINUR NEGATIVE 08/05/2020 1001   UROBILINOGEN 0.2 08/05/2020 1001   NITRITE NEGATIVE 08/05/2020 1001   LEUKOCYTESUR NEGATIVE 08/05/2020 1001   Sepsis Labs: @LABRCNTIP (procalcitonin:4,lacticacidven:4)  ) Recent Results (from the past 240 hour(s))  Resp Panel by RT-PCR (Flu A&B, Covid) Nasopharyngeal Swab     Status: None   Collection Time: 04/17/21  3:05 AM   Specimen: Nasopharyngeal Swab; Nasopharyngeal(NP) swabs in vial transport medium  Result Value Ref Range Status   SARS Coronavirus 2 by RT PCR NEGATIVE NEGATIVE Final    Comment: (NOTE) SARS-CoV-2 target nucleic acids are NOT DETECTED.  The SARS-CoV-2 RNA is generally detectable in upper  respiratory specimens during the acute phase of infection. The lowest concentration of SARS-CoV-2 viral copies this assay can detect is 138 copies/mL. A negative result does not preclude SARS-Cov-2 infection and should not be used as the sole basis for treatment or other patient management decisions. A negative result may occur with  improper specimen collection/handling, submission of specimen other than nasopharyngeal swab, presence of viral mutation(s) within the areas targeted by this assay, and inadequate number of viral copies(<138 copies/mL). A negative result must be combined with clinical observations, patient history, and epidemiological information. The expected result is Negative.  Fact Sheet for Patients:  EntrepreneurPulse.com.au  Fact Sheet for Healthcare Providers:  IncredibleEmployment.be  This test is no t yet approved or cleared by the [place] FDA and  has been authorized for detection and/or diagnosis of SARS-CoV-2 by FDA under an Emergency Use Authorization (EUA). This EUA will remain  in effect (meaning this test can be used) for the duration of the COVID-19 declaration under Section 564(b)(1) of the Act, 21 U.S.C.section 360bbb-3(b)(1), unless the authorization is terminated  or revoked sooner.       Influenza A by PCR NEGATIVE NEGATIVE Final   Influenza B by PCR NEGATIVE NEGATIVE Final    Comment: (NOTE) The Xpert Xpress SARS-CoV-2/FLU/RSV plus assay is intended as an aid in the diagnosis of influenza from Nasopharyngeal swab specimens and should not be used as a sole basis for treatment. Nasal washings and aspirates are unacceptable for Xpert Xpress SARS-CoV-2/FLU/RSV testing.  Fact Sheet for Patients: EntrepreneurPulse.com.au  Fact Sheet for Healthcare Providers: IncredibleEmployment.be  This test is not yet approved or cleared by the [place] FDA and has been  authorized for detection and/or diagnosis of SARS-CoV-2 by FDA under an Emergency Use Authorization (EUA). This EUA will remain in effect (meaning this test can be used) for the duration of the COVID-19 declaration  under Section 564(b)(1) of the Act, 21 U.S.C. section 360bbb-3(b)(1), unless the authorization is terminated or revoked.  Performed at [facility], [place] [address], [place], [place] [address]          Radiology Studies: DG Chest 2 View  Result Date: 04/16/2021 CLINICAL DATA:  Left shoulder pain and weakness. EXAM: CHEST - 2 VIEW COMPARISON:  None. FINDINGS: The heart size and mediastinal contours are within normal limits. Both lungs are clear. Radiopaque surgical clips are seen overlying the left axilla. The visualized skeletal structures are unremarkable. IMPRESSION: No active cardiopulmonary disease. Electronically Signed   By: Virgina Norfolk M.D.   On: 04/16/2021 20:30        Scheduled Meds: . insulin aspart  0-5 Units Subcutaneous QHS  . insulin aspart  0-9 Units Subcutaneous TID WC  . insulin detemir  10 Units Subcutaneous BID  . levothyroxine  50 mcg Oral Daily  . melatonin  6 mg Oral QHS   Continuous Infusions: . sodium chloride 75 mL/hr at 04/17/21 0935     LOS: 0 days    Time spent: 25 minutes   Dorette Hartel Manning Charity, MD [facility] 04/17/2021, 5:16 PM     Please page though [place] or Epic secure chat:  For [employer], Adult nurse

## 2021-04-17 NOTE — ED Notes (Signed)
IV bolus NS running at 544ml/hr per MD

## 2021-04-17 NOTE — ED Notes (Signed)
Patient was walking back to her and was noted to be upset. Patient made it verbally aware by stating " I've been here over 20 hours and that secretary told me it's better for me to stay in my room. I am I just supposed to stay in my room for 20 hours? I was just walking to the bathroom" This RN acknowledge patient and approached the patient, walked with the patient back to the room. This RN asked patient how I could be of assistance. Patient stated that she was upset with how the Secretary addressed her. Patient stated " It's not fair that I have to wait down here for 20 hours and I can't even walk around. I have been asking for a catheter kit to self cath and no one has brought me anything. I have used the same catheter twice." This RN apologized for the wait time and acknowledged her frustration. This RN brought her multiple catheter kits and informed the patient to call out if she would be needing anything. The primary RN has been made aware.

## 2021-07-11 ENCOUNTER — Ambulatory Visit (HOSPITAL_COMMUNITY)
Admission: EM | Admit: 2021-07-11 | Discharge: 2021-07-11 | Disposition: A | Discharge status: Home / Self Care | Payer: Medicaid Other | Attending: Physician Assistant | Admitting: Physician Assistant

## 2021-07-11 ENCOUNTER — Other Ambulatory Visit: Payer: Self-pay

## 2021-07-11 ENCOUNTER — Encounter (HOSPITAL_COMMUNITY): Payer: Self-pay | Admitting: *Deleted

## 2021-07-11 ENCOUNTER — Telehealth (HOSPITAL_COMMUNITY): Payer: Self-pay | Admitting: Emergency Medicine

## 2021-07-11 DIAGNOSIS — L02412 Cutaneous abscess of left axilla: Secondary | ICD-10-CM | POA: Diagnosis not present

## 2021-07-11 MED ORDER — CEPHALEXIN 500 MG PO CAPS: 500.0000 mg | ORAL_CAPSULE | Freq: Four times a day (QID) | ORAL | 0 refills | Status: AC

## 2021-07-11 MED ORDER — FLUCONAZOLE 150 MG PO TABS: 150.0000 mg | ORAL_TABLET | Freq: Every day | ORAL | 0 refills | Status: AC

## 2021-07-11 MED ORDER — CLINDAMYCIN HCL 300 MG PO CAPS: 300.0000 mg | ORAL_CAPSULE | Freq: Three times a day (TID) | ORAL | 0 refills | Status: DC

## 2021-07-11 NOTE — Telephone Encounter (Signed)
Tried contacting pt to advise new RX was sent to pharmacy. Unable to LVM.

## 2021-07-11 NOTE — ED Provider Notes (Addendum)
[facility]    CSN: [id number] Arrival date & time: 07/11/21  0935      History   Chief Complaint Chief Complaint  Patient presents with   Abscess    Rt axilla    HPI Mary Erickson is a 55 y.o. female.   C/w "abscess" x R axilla x 1 week.  Admits pain, tenderness, swelling, erythema.  Denies f/c, n/v, abdominal pain.  She has some cipro at home which she took and she states helped.     Past Medical History:  Diagnosis Date   Acid reflux    Anemia    Arthritis    "elbows, hands, neck, shoulders" (10/02/2016)   Calcium blood increased    Chronic neck pain    Chronic UTI (urinary tract infection)    "from self caths" (10/02/2016)   Hepatitis B    acute hepatitis B from lancet   History of alcoholism ([place])    10/02/2016 "sober since 03/14/2005"   History of blood transfusion 1984; 1989; 2008   "quite a few"   History of shortness of breath    Hypercholesteremia    Hypothyroidism    Internal carotid aneurysm dx'd early 2017   Legally blind in left eye, as defined in [place]    Melanoma [facility]    left upper arm   Migraines    "quite a few in a month; at least 15" (10/02/2016)   Neuropathy    neck; "not sure if it's from DM or Sjogren's" (10/02/2016)   Pneumonia 1986   "1 yr after major OR when I was under anesthesia for 13 hr"   Seizures ([place]) ~ 2004; ~ 2013   "seizures from diabetes "   Self-catheterizes urinary bladder    "since OR in 1984" (10/02/2016)   Sjogren's syndrome ([place])    Thyroid goiter    "still present" (10/02/2016)   Type II diabetes mellitus ([place])    Urinary retention    self caths   Wears glasses     Patient Active Problem List   Diagnosis Date Noted   Hyponatremia 04/17/2021   IBS (irritable bowel syndrome) 04/17/2021   Dizziness 12/11/2019   Colitis 12/11/2019   Abnormal CT scan, colon    Acute colitis 12/08/2019   Orthostatic hypotension 12/08/2019   Type 1 diabetes mellitus ([place]) 06/30/2019   Hashimoto's disease  06/30/2019   Sjogren's syndrome ([place]) 06/30/2019   Hx of Hypertension 06/30/2019   Neurogenic bladder 06/30/2019   Malignant melanoma of left upper arm ([place]) 10/02/2016    Past Surgical History:  Procedure Laterality Date   ABDOMINAL HYSTERECTOMY  2008   ECTOPIC PREGNANCY SURGERY  1989   EXCISION MELANOMA WITH SENTINEL LYMPH NODE BIOPSY Left 10/02/2016   Procedure: WIDE LOCAL EXCISION AND ADVANCEMENT FLAP CLOSURE LEFT UPPER  MELANOMA WITH SENTINEL LYMPH NODE MAPPING AND BIOPSY;  Surgeon: [name], MD;  Location: [place];  Service: General;  Laterality: Left;   EYE SURGERY     LAPAROSCOPIC CHOLECYSTECTOMY  ~ 01/2016   MELANOMA EXCISION WITH SENTINEL LYMPH NODE BIOPSY Left 10/02/2016   WIDE LOCAL EXCISION AND ADVANCEMENT FLAP CLOSURE LEFT UPPER  MELANOMA WITH SENTINEL LYMPH NODE MAPPING AND BIOPSY   SHOULDER ARTHROSCOPY W/ ROTATOR CUFF REPAIR Right 2014   "partial"   STRABISMUS SURGERY Left 1971   STRABISMUS SURGERY Left 03/18/2020   Procedure: STRABISMUS REPAIR;  Surgeon: Everitt Amber, MD;  Location: [place];  Service: Ophthalmology;  Laterality: Left;   TUMOR EXCISION  1984  from tail bone and part of tail bone removed    OB History   No obstetric history on file.      Home Medications    Prior to Admission medications   Medication Sig Start Date End Date Taking? Authorizing Provider  cephALEXin (KEFLEX) 500 MG capsule Take 1 capsule (500 mg total) by mouth 4 (four) times daily for 7 days. 07/11/21 07/18/21 Yes Peri Jefferson, PA-C  fluconazole (DIFLUCAN) 150 MG tablet Take 1 tablet (150 mg total) by mouth daily for 3 days. 07/11/21 07/14/21 Yes Peri Jefferson, PA-C  Ascorbic Acid (VITAMIN C) 1000 MG tablet Take 1,000 mg by mouth daily.    [provider]  Cholecalciferol (D3-1000) 25 MCG (1000 UT) capsule Take 1,000 Units by mouth daily.     [provider]  Cyanocobalamin (B-12) 2500 MCG TABS Take 2,500 mcg by mouth daily.     [provider]  cycloSPORINE (RESTASIS) 0.05 % ophthalmic emulsion Place 1 drop into both eyes 2 (two) times daily. 02/11/20   [provider]  diclofenac Sodium (VOLTAREN) 1 % GEL Apply 2 g topically 4 (four) times daily. 12/17/19   Darr, Edison Nasuti, PA-C  estrogens, conjugated, (PREMARIN) 0.45 MG tablet Take 0.45 mg by mouth daily. Take daily for 21 days then do not take for 7 days.    [provider]  insulin aspart (NOVOLOG) 100 UNIT/ML injection Inject 50-60 Units into the skin See admin instructions. Insulin pump basil rate 50-60 units throughout day. Additional 0-5 units per bolos.    [provider]  Insulin Detemir (LEVEMIR FLEXTOUCH) 100 UNIT/ML Pen Inject 4 Units into the skin 2 (two) times daily as needed (in case pump breaks).  09/27/17   [provider]  Insulin Human (INSULIN PUMP) SOLN Inject into the skin.    [provider]  Levothyroxine Sodium 50 MCG CAPS Take 25-50 mcg by mouth daily. Depending on thyroid levels. 11/10/19   [provider]  Magnesium 400 MG CAPS Take 400 mg by mouth daily.    [provider]  melatonin 1 MG TABS tablet Take 6 mg by mouth at bedtime.    [provider]  Multiple Vitamins-Minerals (MULTIVITAMINS THER. W/MINERALS) TABS Take 1 tablet by mouth daily.    [provider]  Naphazoline HCl (CLEAR EYES OP) Place 1 drop into both eyes daily.    [provider]  omeprazole (PRILOSEC) 40 MG capsule Take 40 mg by mouth daily. 05/04/19   [provider]  ondansetron (ZOFRAN ODT) 4 MG disintegrating tablet Take 1 tablet (4 mg total) by mouth every 8 (eight) hours as needed for nausea or vomiting. 12/07/19   Sherwood Gambler, MD  predniSONE (DELTASONE) 5 MG tablet Take 5 mg by mouth daily as needed (flare up). 01/31/21   [provider]  vitamin E 180 MG (400 UNITS) capsule Take 400 Units by mouth daily.    [provider]    Family History History reviewed.  No pertinent family history.  Social History Social History   Tobacco Use   Smoking status: Former    Packs/day: 1.50    Years: 24.00    Pack years: 36.00    Types: Cigarettes    Quit date: 2008    Years since quitting: 14.6   Smokeless tobacco: Never  Substance Use Topics   Alcohol use: No    Comment: 10/02/2016 "sober since 03/14/2005"   Drug use: Yes    Types: Marijuana    Comment: 10/02/2016 "  occasional; nothing since before 2006"     Allergies   Doxycycline, Aspirin, Ciprofloxacin, Nsaids, Amoxicillin-pot clavulanate, Codeine, Hydrocodone-acetaminophen, Oxycodone, Sulfa antibiotics, and Sulfasalazine   Review of Systems Review of Systems  Constitutional:  Negative for chills, fatigue and fever.  Gastrointestinal:  Negative for nausea and vomiting.  Musculoskeletal:  Positive for myalgias. Negative for arthralgias.  Skin:  Positive for color change. Negative for wound.  Psychiatric/Behavioral:  Negative for sleep disturbance.     Physical Exam Triage Vital Signs ED Triage Vitals  Enc Vitals Group     BP 07/11/21 1036 123/75     Pulse Rate 07/11/21 1036 66     Resp 07/11/21 1036 16     Temp 07/11/21 1036 98 F (36.7 C)     Temp src --      SpO2 07/11/21 1036 100 %     Weight --      Height --      Head Circumference --      Peak Flow --      Pain Score 07/11/21 1037 5     Pain Loc --      Pain Edu? --      Excl. in [place]? --    No data found.  Updated Vital Signs BP 123/75   Pulse 66   Temp 98 F (36.7 C)   Resp 16   SpO2 100%   Visual Acuity Right Eye Distance:   Left Eye Distance:   Bilateral Distance:    Right Eye Near:   Left Eye Near:    Bilateral Near:     Physical Exam Vitals and nursing note reviewed.  Constitutional:      General: She is not in acute distress.    Appearance: Normal appearance. She is not ill-appearing.  HENT:     Head: Normocephalic and atraumatic.  Eyes:     General: No scleral icterus.    Extraocular  Movements: Extraocular movements intact.     Conjunctiva/sclera: Conjunctivae normal.  Pulmonary:     Effort: Pulmonary effort is normal. No respiratory distress.  Musculoskeletal:     Cervical back: Normal range of motion. No rigidity.  Skin:    Coloration: Skin is not jaundiced.     Findings: No rash.  Neurological:     General: No focal deficit present.     Mental Status: She is alert and oriented to person, place, and time.     Motor: No weakness.     Gait: Gait normal.  Psychiatric:        Mood and Affect: Mood normal.        Behavior: Behavior normal.     UC Treatments / Results  Labs (all labs ordered are listed, but only abnormal results are displayed) Labs Reviewed - No data to display  EKG   Radiology No results found.  Procedures Incision and Drainage  Date/Time: 07/11/2021 12:39 PM Performed by: Peri Jefferson, PA-C Authorized by: Peri Jefferson, PA-C   Consent:    Consent given by:  Patient   Risks discussed:  Pain and infection   Alternatives discussed:  No treatment Universal protocol:    Patient identity confirmed:  Verbally with patient and arm band Location:    Type:  Abscess   Size:  2 cm   Location:  Trunk   Trunk location:  Chest Pre-procedure details:    Skin preparation:  Antiseptic wash Sedation:    Sedation type:  None Anesthesia:    Anesthesia method:  Local infiltration  Local anesthetic:  Lidocaine 2% WITH epi Procedure type:    Complexity:  Simple Procedure details:    Incision types:  Stab incision   Incision depth:  Subcutaneous   Wound management:  Probed and deloculated and irrigated with saline   Drainage:  Purulent   Drainage amount:  Moderate   Wound treatment:  Wound left open   Packing materials:  1/2 in iodoform gauze Post-procedure details:    Procedure completion:  Tolerated well, no immediate complications (including critical care time)  Medications Ordered in UC Medications - No data to  display  Initial Impression / Assessment and Plan / UC Course  I have reviewed the triage vital signs and the nursing notes.  Pertinent labs & imaging results that were available during my care of the patient were reviewed by me and considered in my medical decision making (see chart for details).     Patient declined clindamycin due to ulcerative colitis (not documented in chart) She is allergic to bactrim, PCN, and doxycycline Will start keflex Final Clinical Impressions(s) / UC Diagnoses   Final diagnoses:  Abscess of left axilla   Discharge Instructions   None    ED Prescriptions     Medication Sig Dispense Auth. Provider   clindamycin (CLEOCIN) 300 MG capsule  (Status: Discontinued) Take 1 capsule (300 mg total) by mouth 3 (three) times daily for 7 days. 21 capsule Peri Jefferson, PA-C   fluconazole (DIFLUCAN) 150 MG tablet Take 1 tablet (150 mg total) by mouth daily for 3 days. 3 tablet Peri Jefferson, PA-C   cephALEXin (KEFLEX) 500 MG capsule Take 1 capsule (500 mg total) by mouth 4 (four) times daily for 7 days. 28 capsule Peri Jefferson, PA-C      PDMP not reviewed this encounter.   Peri Jefferson, PA-C 07/11/21 1238    Peri Jefferson, PA-C 07/11/21 1240

## 2021-07-11 NOTE — ED Triage Notes (Signed)
Pt reports abscess rt axilla.

## 2021-09-13 ENCOUNTER — Emergency Department (HOSPITAL_COMMUNITY)
Admission: EM | Admit: 2021-09-13 | Discharge: 2021-09-13 | Disposition: A | Discharge status: Home / Self Care | Payer: Medicaid Other | Attending: Emergency Medicine | Admitting: Emergency Medicine

## 2021-09-13 ENCOUNTER — Encounter (HOSPITAL_COMMUNITY): Payer: Self-pay | Admitting: Emergency Medicine

## 2021-09-13 DIAGNOSIS — E860 Dehydration: Secondary | ICD-10-CM | POA: Insufficient documentation

## 2021-09-13 DIAGNOSIS — E119 Type 2 diabetes mellitus without complications: Secondary | ICD-10-CM | POA: Diagnosis not present

## 2021-09-13 DIAGNOSIS — Z5321 Procedure and treatment not carried out due to patient leaving prior to being seen by health care provider: Secondary | ICD-10-CM | POA: Insufficient documentation

## 2021-09-13 LAB — CBC
HCT: 40.5 % (ref 36.0–46.0)
Hemoglobin: 13.1 g/dL (ref 12.0–15.0)
MCH: 30.9 pg (ref 26.0–34.0)
MCHC: 32.3 g/dL (ref 30.0–36.0)
MCV: 95.5 fL (ref 80.0–100.0)
Platelets: 206 10*3/uL (ref 150–400)
RBC: 4.24 MIL/uL (ref 3.87–5.11)
RDW: 13.3 % (ref 11.5–15.5)
WBC: 4.5 10*3/uL (ref 4.0–10.5)
nRBC: 0 % (ref 0.0–0.2)

## 2021-09-13 LAB — CBG MONITORING, ED: Glucose-Capillary: 101 mg/dL — ABNORMAL HIGH (ref 70–99)

## 2021-09-13 LAB — BASIC METABOLIC PANEL
Anion gap: 6 (ref 5–15)
BUN: 19 mg/dL (ref 6–20)
CO2: 28 mmol/L (ref 22–32)
Calcium: 9.5 mg/dL (ref 8.9–10.3)
Chloride: 106 mmol/L (ref 98–111)
Creatinine, Ser: 0.63 mg/dL (ref 0.44–1.00)
GFR, Estimated: >60 mL/min (ref >60)
Glucose, Bld: 133 mg/dL — ABNORMAL HIGH (ref 70–99)
Potassium: 4.2 mmol/L (ref 3.5–5.1)
Sodium: 140 mmol/L (ref 135–145)

## 2021-09-13 NOTE — ED Triage Notes (Signed)
Per pt, states she is dehydrated-states this happens every few months-states he DM and thyroid disease contributes to symptoms

## 2021-10-31 ENCOUNTER — Encounter (HOSPITAL_COMMUNITY): Payer: Self-pay

## 2021-10-31 ENCOUNTER — Other Ambulatory Visit: Payer: Self-pay

## 2021-10-31 ENCOUNTER — Ambulatory Visit (HOSPITAL_COMMUNITY)
Admission: EM | Admit: 2021-10-31 | Discharge: 2021-10-31 | Disposition: A | Discharge status: Home / Self Care | Payer: Medicaid Other | Attending: Emergency Medicine | Admitting: Emergency Medicine

## 2021-10-31 DIAGNOSIS — N3001 Acute cystitis with hematuria: Secondary | ICD-10-CM | POA: Diagnosis not present

## 2021-10-31 LAB — POCT URINALYSIS DIPSTICK, ED / UC
Bilirubin Urine: NEGATIVE
Glucose, UA: NEGATIVE mg/dL
Ketones, ur: NEGATIVE mg/dL
Nitrite: NEGATIVE
Protein, ur: NEGATIVE mg/dL
Specific Gravity, Urine: 1.01 (ref 1.005–1.030)
Urobilinogen, UA: 0.2 mg/dL (ref 0.0–1.0)
pH: 7 (ref 5.0–8.0)

## 2021-10-31 MED ORDER — CIPROFLOXACIN HCL 250 MG PO TABS: 250.0000 mg | ORAL_TABLET | Freq: Two times a day (BID) | ORAL | 0 refills | Status: AC

## 2021-10-31 NOTE — ED Triage Notes (Signed)
Pt c/o cloudy urine with odor x2-3 days, worse today. States cath herself normally 6-8 times a day. States having to cath more often today. C/o upper back pain. Hx of UTI's.

## 2021-10-31 NOTE — ED Provider Notes (Signed)
[place]  ____________________________________________  Time seen: Approximately 11:57 AM  I have reviewed the triage vital signs and the nursing notes.   HISTORY  Chief Complaint Urinary Tract Infection   Historian Patient     HPI Mary Erickson is a 55 y.o. female presents to the urgent care with dysuria, low back pain and cloudy urine.  Patient self caths daily and states that she has not had a urinary tract infection in approximately a year but her current symptoms feel similar.  She denies nausea, vomiting or abdominal pain.   Past Medical History:  Diagnosis Date   Acid reflux    Anemia    Arthritis    "elbows, hands, neck, shoulders" (10/02/2016)   Calcium blood increased    Chronic neck pain    Chronic UTI (urinary tract infection)    "from self caths" (10/02/2016)   Hepatitis B    acute hepatitis B from lancet   History of alcoholism ([place])    10/02/2016 "sober since 03/14/2005"   History of blood transfusion 1984; 1989; 2008   "quite a few"   History of shortness of breath    Hypercholesteremia    Hypothyroidism    Internal carotid aneurysm dx'd early 2017   Legally blind in left eye, as defined in [place]    Melanoma [facility])    left upper arm   Migraines    "quite a few in a month; at least 15" (10/02/2016)   Neuropathy    neck; "not sure if it's from DM or Sjogren's" (10/02/2016)   Pneumonia 1986   "1 yr after major OR when I was under anesthesia for 13 hr"   Seizures ([place]) ~ 2004; ~ 2013   "seizures from diabetes "   Self-catheterizes urinary bladder    "since OR in 1984" (10/02/2016)   Sjogren's syndrome ([place])    Thyroid goiter    "still present" (10/02/2016)   Type II diabetes mellitus ([place])    Urinary retention    self caths   Wears glasses      Immunizations up to date:  Yes.     Past Medical History:  Diagnosis Date   Acid reflux    Anemia    Arthritis    "elbows, hands, neck, shoulders" (10/02/2016)   Calcium  blood increased    Chronic neck pain    Chronic UTI (urinary tract infection)    "from self caths" (10/02/2016)   Hepatitis B    acute hepatitis B from lancet   History of alcoholism ([place])    10/02/2016 "sober since 03/14/2005"   History of blood transfusion 1984; 1989; 2008   "quite a few"   History of shortness of breath    Hypercholesteremia    Hypothyroidism    Internal carotid aneurysm dx'd early 2017   Legally blind in left eye, as defined in [place]    Melanoma [facility])    left upper arm   Migraines    "quite a few in a month; at least 15" (10/02/2016)   Neuropathy    neck; "not sure if it's from DM or Sjogren's" (10/02/2016)   Pneumonia 1986   "1 yr after major OR when I was under anesthesia for 13 hr"   Seizures ([place]) ~ 2004; ~ 2013   "seizures from diabetes "   Self-catheterizes urinary bladder    "since OR in 1984" (10/02/2016)   Sjogren's syndrome ([place])    Thyroid goiter    "still present" (10/02/2016)   Type  II diabetes mellitus ([place])    Urinary retention    self caths   Wears glasses     Patient Active Problem List   Diagnosis Date Noted   Hyponatremia 04/17/2021   IBS (irritable bowel syndrome) 04/17/2021   Dizziness 12/11/2019   Colitis 12/11/2019   Abnormal CT scan, colon    Acute colitis 12/08/2019   Orthostatic hypotension 12/08/2019   Type 1 diabetes mellitus ([place]) 06/30/2019   Hashimoto's disease 06/30/2019   Sjogren's syndrome ([place]) 06/30/2019   Hx of Hypertension 06/30/2019   Neurogenic bladder 06/30/2019   Malignant melanoma of left upper arm ([place]) 10/02/2016    Past Surgical History:  Procedure Laterality Date   ABDOMINAL HYSTERECTOMY  2008   ECTOPIC PREGNANCY SURGERY  1989   EXCISION MELANOMA WITH SENTINEL LYMPH NODE BIOPSY Left 10/02/2016   Procedure: WIDE LOCAL EXCISION AND ADVANCEMENT FLAP CLOSURE LEFT UPPER  MELANOMA WITH SENTINEL LYMPH NODE MAPPING AND BIOPSY;  Surgeon: [name], MD;  Location: [place];  Service: General;  Laterality:  Left;   EYE SURGERY     LAPAROSCOPIC CHOLECYSTECTOMY  ~ 01/2016   MELANOMA EXCISION WITH SENTINEL LYMPH NODE BIOPSY Left 10/02/2016   WIDE LOCAL EXCISION AND ADVANCEMENT FLAP CLOSURE LEFT UPPER  MELANOMA WITH SENTINEL LYMPH NODE MAPPING AND BIOPSY   SHOULDER ARTHROSCOPY W/ ROTATOR CUFF REPAIR Right 2014   "partial"   STRABISMUS SURGERY Left 1971   STRABISMUS SURGERY Left 03/18/2020   Procedure: STRABISMUS REPAIR;  Surgeon: Everitt Amber, MD;  Location: [place];  Service: Ophthalmology;  Laterality: Left;   TUMOR EXCISION  1984   from tail bone and part of tail bone removed    Prior to Admission medications   Medication Sig Start Date End Date Taking? Authorizing Provider  ciprofloxacin (CIPRO) 250 MG tablet Take 1 tablet (250 mg total) by mouth 2 (two) times daily for 10 days. 10/31/21 11/10/21 Yes Vallarie Mare M, PA-C  Ascorbic Acid (VITAMIN C) 1000 MG tablet Take 1,000 mg by mouth daily.    [provider]  Cholecalciferol (D3-1000) 25 MCG (1000 UT) capsule Take 1,000 Units by mouth daily.     [provider]  CONCERTA 27 MG CR tablet Take 27 mg by mouth every morning. 09/29/21   [provider]  Cyanocobalamin (B-12) 2500 MCG TABS Take 2,500 mcg by mouth daily.     [provider]  cycloSPORINE (RESTASIS) 0.05 % ophthalmic emulsion Place 1 drop into both eyes 2 (two) times daily. 02/11/20   [provider]  diclofenac Sodium (VOLTAREN) 1 % GEL Apply 2 g topically 4 (four) times daily. 12/17/19   Darr, Edison Nasuti, PA-C  estrogens, conjugated, (PREMARIN) 0.45 MG tablet Take 0.45 mg by mouth daily. Take daily for 21 days then do not take for 7 days.    [provider]  insulin aspart (NOVOLOG) 100 UNIT/ML injection Inject 50-60 Units into the skin See admin instructions. Insulin pump basil rate 50-60 units throughout day. Additional 0-5 units per bolos.    [provider]  Insulin Detemir (LEVEMIR FLEXTOUCH) 100 UNIT/ML  Pen Inject 4 Units into the skin 2 (two) times daily as needed (in case pump breaks).  09/27/17   [provider]  Insulin Human (INSULIN PUMP) SOLN Inject into the skin.    [provider]  Levothyroxine Sodium 50 MCG CAPS Take 25-50 mcg by mouth daily. Depending on thyroid levels. 11/10/19   [provider]  Magnesium 400 MG CAPS Take 400 mg by  mouth daily.    [provider]  melatonin 1 MG TABS tablet Take 6 mg by mouth at bedtime.    [provider]  Multiple Vitamins-Minerals (MULTIVITAMINS THER. W/MINERALS) TABS Take 1 tablet by mouth daily.    [provider]  Naphazoline HCl (CLEAR EYES OP) Place 1 drop into both eyes daily.    [provider]  omeprazole (PRILOSEC) 40 MG capsule Take 40 mg by mouth daily. 05/04/19   [provider]  ondansetron (ZOFRAN ODT) 4 MG disintegrating tablet Take 1 tablet (4 mg total) by mouth every 8 (eight) hours as needed for nausea or vomiting. 12/07/19   Sherwood Gambler, MD  vitamin E 180 MG (400 UNITS) capsule Take 400 Units by mouth daily.    [provider]    Allergies Doxycycline, Aspirin, Ciprofloxacin, Nsaids, Amoxicillin-pot clavulanate, Codeine, Hydrocodone-acetaminophen, Oxycodone, Sulfa antibiotics, and Sulfasalazine  History reviewed. No pertinent family history.  Social History Social History   Tobacco Use   Smoking status: Former    Packs/day: 1.50    Years: 24.00    Pack years: 36.00    Types: Cigarettes    Quit date: 2008    Years since quitting: 14.9   Smokeless tobacco: Never  Substance Use Topics   Alcohol use: No    Comment: 10/02/2016 "sober since 03/14/2005"   Drug use: Yes    Types: Marijuana    Comment: 10/02/2016 " occasional; nothing since before 2006"     Review of Systems  Constitutional: No fever/chills Eyes:  No discharge ENT: No upper respiratory complaints. Respiratory: no cough. No SOB/ use of accessory muscles to  breath Gastrointestinal:   No nausea, no vomiting.  No diarrhea.  No constipation. Musculoskeletal: Patient has low back pain.  Skin: Negative for rash, abrasions, lacerations, ecchymosis.    ____________________________________________   PHYSICAL EXAM:  VITAL SIGNS: ED Triage Vitals  Enc Vitals Group     BP 10/31/21 1116 129/81     Pulse Rate 10/31/21 1116 82     Resp 10/31/21 1116 18     Temp 10/31/21 1116 98.5 F (36.9 C)     Temp Source 10/31/21 1116 Oral     SpO2 10/31/21 1116 99 %     Weight --      Height --      Head Circumference --      Peak Flow --      Pain Score 10/31/21 1114 0     Pain Loc --      Pain Edu? --      Excl. in [place]? --      Constitutional: Alert and oriented. Well appearing and in no acute distress. Eyes: Conjunctivae are normal. PERRL. EOMI. Head: Atraumatic. ENT:      Nose: No congestion/rhinnorhea.      Mouth/Throat: Mucous membranes are moist.  Neck: No stridor.  No cervical spine tenderness to palpation. Cardiovascular: Normal rate, regular rhythm. Normal S1 and S2.  Good peripheral circulation. Respiratory: Normal respiratory effort without tachypnea or retractions. Lungs CTAB. Good air entry to the bases with no decreased or absent breath sounds Gastrointestinal: Bowel sounds x 4 quadrants. Soft and nontender to palpation. No guarding or rigidity. No distention. Musculoskeletal: Full range of motion to all extremities. No obvious deformities noted Neurologic:  Normal for age. No gross focal neurologic deficits are appreciated.  Skin:  Skin is warm, dry and intact. No rash noted. Psychiatric: Mood and affect are normal for age. Speech and behavior are normal.  ____________________________________________   LABS (all labs ordered are listed, but only abnormal results are displayed)  Labs Reviewed  POCT URINALYSIS DIPSTICK, ED / UC - Abnormal; Notable for the following components:      Result Value   Hgb urine dipstick SMALL (*)     Leukocytes,Ua MODERATE (*)    All other components within normal limits  URINE CULTURE   ____________________________________________  EKG   ____________________________________________  RADIOLOGY   No results found.  ____________________________________________    PROCEDURES  Procedure(s) performed:     Procedures     Medications - No data to display   ____________________________________________   INITIAL IMPRESSION / ASSESSMENT AND PLAN / ED COURSE  Pertinent labs & imaging results that were available during my care of the patient were reviewed by me and considered in my medical decision making (see chart for details).      Assessment and plan UTI 55 year old female presents to the urgent care with concern for a possible urinary tract infection.  She has had dysuria and low back pain.  Vital signs were reassuring at triage.  On physical exam, patient was alert, active and nontoxic-appearing.  She had no CVA tenderness.  Urinalysis did not indicate UTI with moderate blood and leukocytes.  Urine culture in process at this time.  We will treat with Cipro given patient's request stating that this medication is worked best for her in the past.  Return precautions were given to return with new or worsening symptoms.  All patient questions were answered.     ____________________________________________  FINAL CLINICAL IMPRESSION(S) / ED DIAGNOSES  Final diagnoses:  Acute cystitis with hematuria      NEW MEDICATIONS STARTED DURING THIS VISIT:  ED Discharge Orders          Ordered    ciprofloxacin (CIPRO) 250 MG tablet  2 times daily        10/31/21 1153                This chart was dictated using voice recognition software/Dragon. Despite best efforts to proofread, errors can occur which can change the meaning. Any change was purely unintentional.     Lannie Fields, PA-C 10/31/21 1201

## 2021-10-31 NOTE — Discharge Instructions (Addendum)
Take ciprofloxacin twice daily for the next 5 days.

## 2021-11-02 LAB — URINE CULTURE: Culture: >=100000 — AB

## 2022-01-11 IMAGING — CR DG CHEST 2V
3 series · 3 of 3 positions shown · non-contrast
Comparison: None.

CLINICAL DATA: Left shoulder pain and weakness.

EXAM:
CHEST - 2 VIEW

[chest lat]
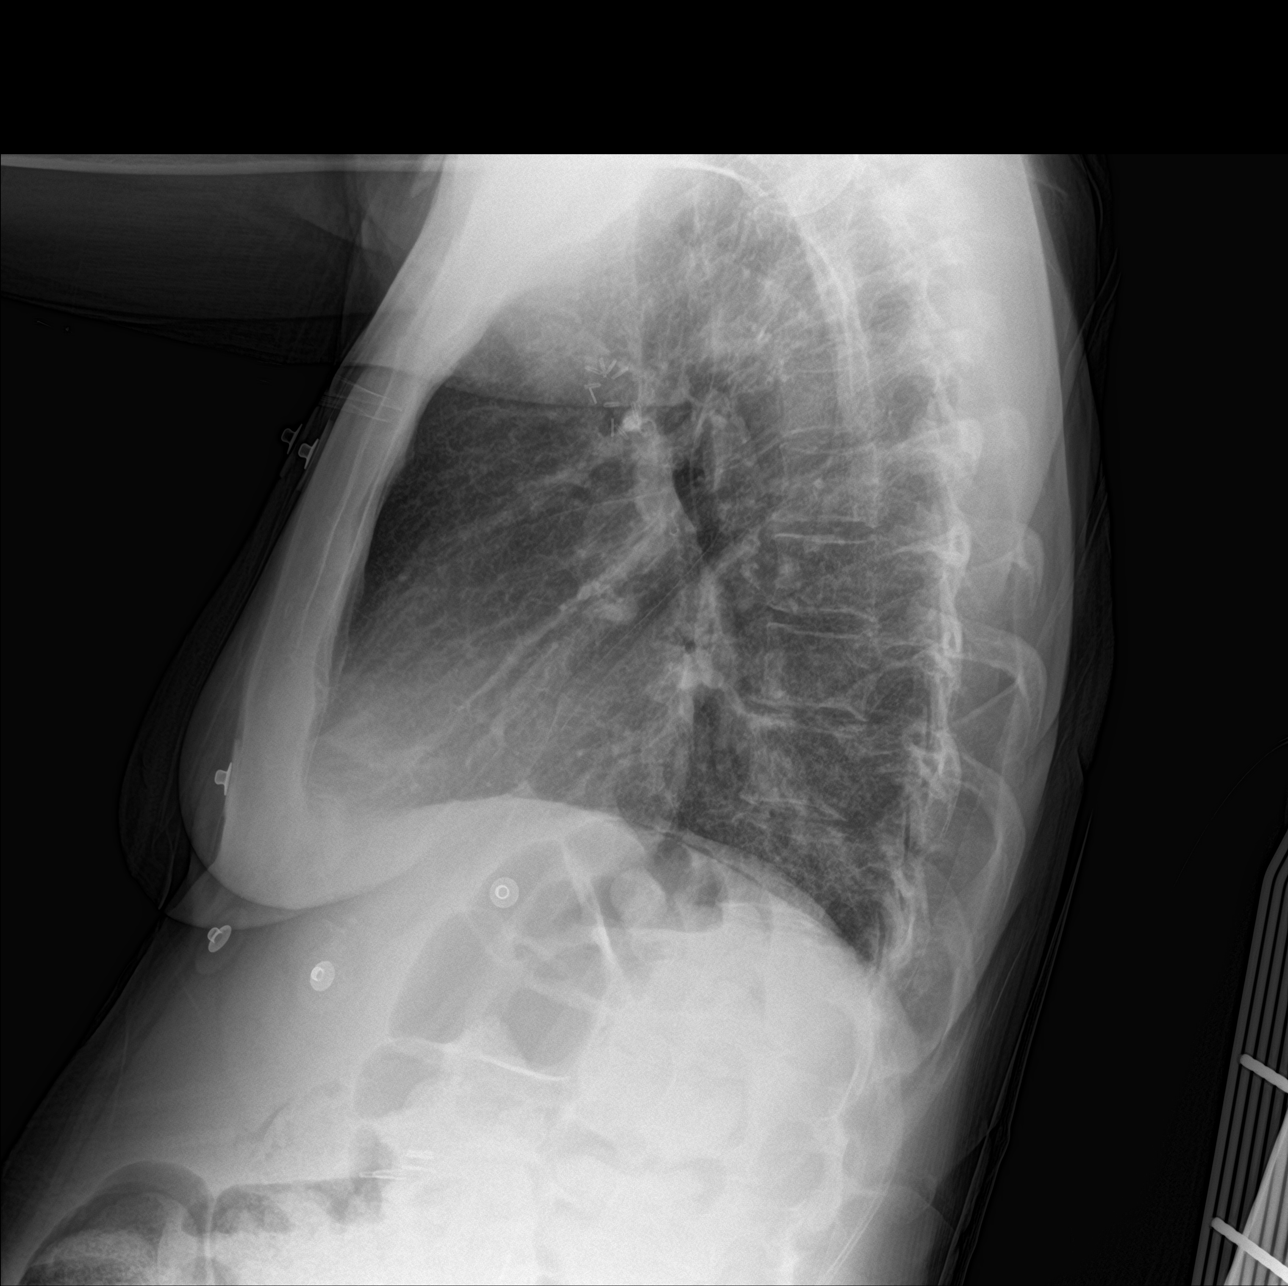

[chest ap (1 of 2)]
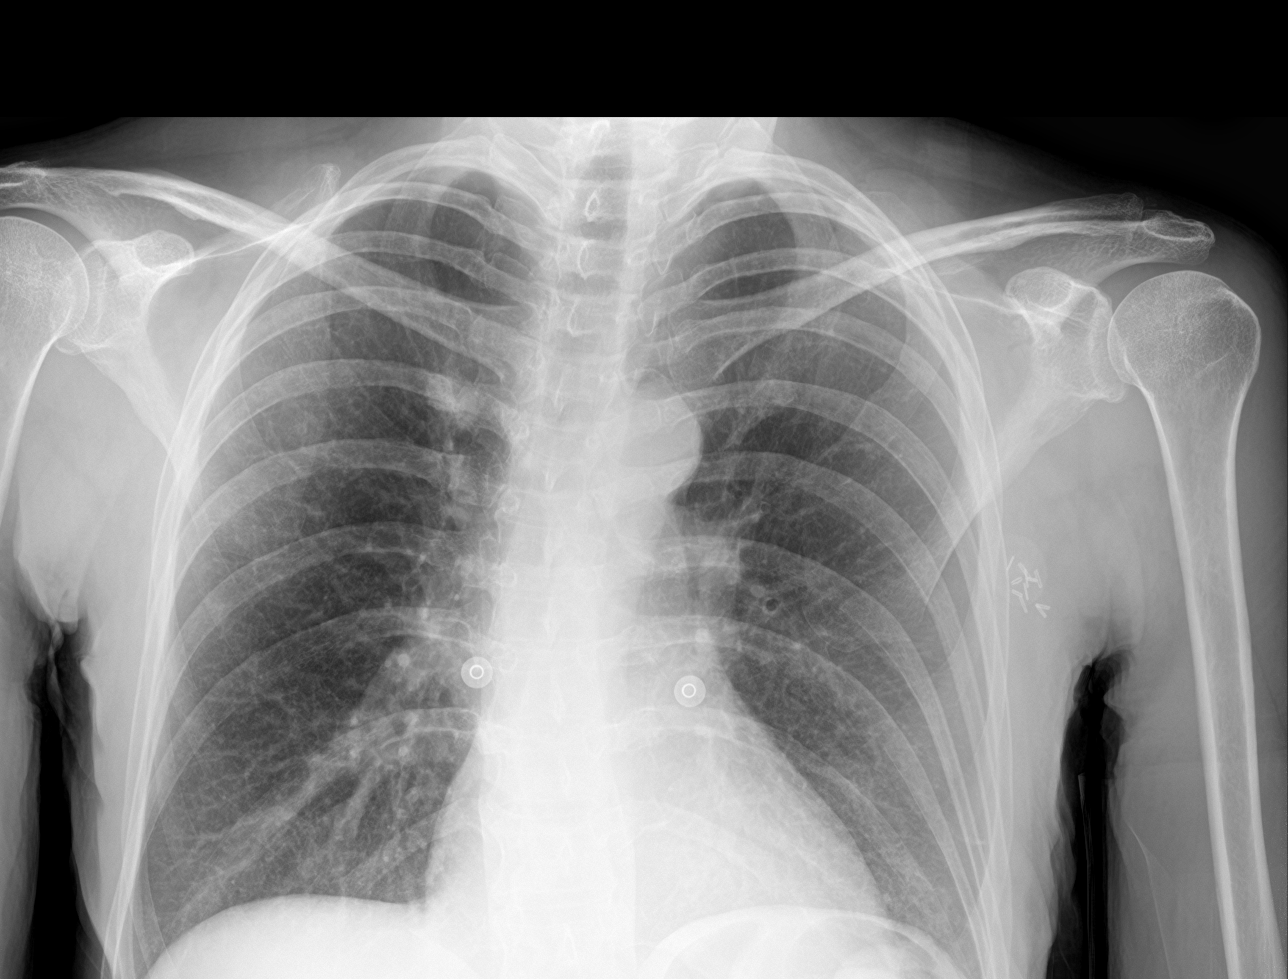

[chest ap (2 of 2)]
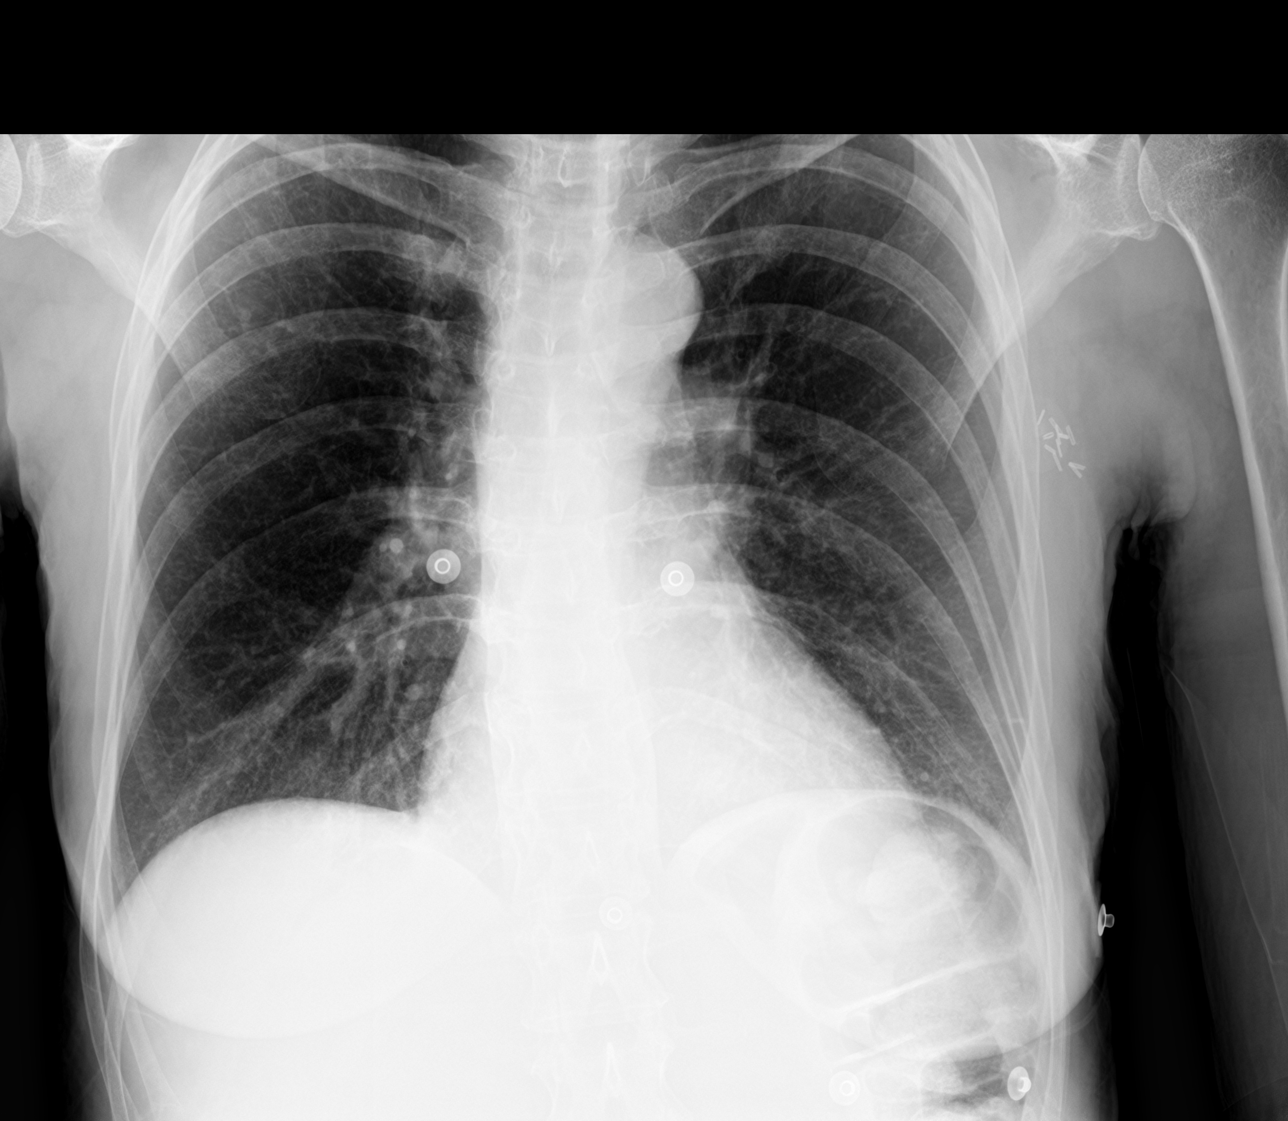

[3 of 3 positions shown; findings below may reference images not displayed]

FINDINGS: The heart size and mediastinal contours are within normal limits.
Both lungs are clear. Radiopaque surgical clips are seen overlying
the left axilla. The visualized skeletal structures are
unremarkable.
IMPRESSION: No active cardiopulmonary disease.

## 2022-04-03 ENCOUNTER — Ambulatory Visit (INDEPENDENT_AMBULATORY_CARE_PROVIDER_SITE_OTHER): Payer: Medicaid Other

## 2022-04-03 ENCOUNTER — Ambulatory Visit: Payer: Medicaid Other | Admitting: Podiatry

## 2022-04-03 DIAGNOSIS — M7989 Other specified soft tissue disorders: Secondary | ICD-10-CM | POA: Diagnosis not present

## 2022-04-03 DIAGNOSIS — M79671 Pain in right foot: Secondary | ICD-10-CM

## 2022-04-10 ENCOUNTER — Other Ambulatory Visit: Payer: Self-pay | Admitting: Podiatry

## 2022-04-10 DIAGNOSIS — M7989 Other specified soft tissue disorders: Secondary | ICD-10-CM

## 2022-04-11 ENCOUNTER — Ambulatory Visit
Admission: RE | Admit: 2022-04-11 | Discharge: 2022-04-11 | Disposition: A | Discharge status: Home / Self Care | Payer: Medicaid Other | Source: Ambulatory Visit | Attending: Podiatry | Admitting: Podiatry

## 2022-04-11 DIAGNOSIS — M7989 Other specified soft tissue disorders: Secondary | ICD-10-CM

## 2022-04-11 NOTE — Progress Notes (Signed)
Subjective:   Patient ID: Mary Erickson, female   DOB: 56 y.o.   MRN: [id number]   HPI 56 year old female presents the office today for concerns about a soft tissue mass, not on the top of her right foot.  This about 3 months ago.  She said the wound when she wears certain shoes it comes out more.  No recent treatment.  No injuries that she reports.  She is a type I diabetic.  Last A1c was 6.1.  Last glucose 75.   Review of Systems  All other systems reviewed and are negative.  Past Medical History:  Diagnosis Date   Acid reflux    Anemia    Arthritis    "elbows, hands, neck, shoulders" (10/02/2016)   Calcium blood increased    Chronic neck pain    Chronic UTI (urinary tract infection)    "from self caths" (10/02/2016)   Hepatitis B    acute hepatitis B from lancet   History of alcoholism ([place])    10/02/2016 "sober since 03/14/2005"   History of blood transfusion 1984; 1989; 2008   "quite a few"   History of shortness of breath    Hypercholesteremia    Hypothyroidism    Internal carotid aneurysm dx'd early 2017   Legally blind in left eye, as defined in [place]    Melanoma [facility])    left upper arm   Migraines    "quite a few in a month; at least 15" (10/02/2016)   Neuropathy    neck; "not sure if it's from DM or Sjogren's" (10/02/2016)   Pneumonia 1986   "1 yr after major OR when I was under anesthesia for 13 hr"   Seizures ([place]) ~ 2004; ~ 2013   "seizures from diabetes "   Self-catheterizes urinary bladder    "since OR in 1984" (10/02/2016)   Sjogren's syndrome ([place])    Thyroid goiter    "still present" (10/02/2016)   Type II diabetes mellitus ([place])    Urinary retention    self caths   Wears glasses     Past Surgical History:  Procedure Laterality Date   ABDOMINAL HYSTERECTOMY  2008   ECTOPIC PREGNANCY SURGERY  1989   EXCISION MELANOMA WITH SENTINEL LYMPH NODE BIOPSY Left 10/02/2016   Procedure: WIDE LOCAL EXCISION AND ADVANCEMENT FLAP CLOSURE LEFT UPPER   MELANOMA WITH SENTINEL LYMPH NODE MAPPING AND BIOPSY;  Surgeon: [name], MD;  Location: [place];  Service: General;  Laterality: Left;   EYE SURGERY     LAPAROSCOPIC CHOLECYSTECTOMY  ~ 01/2016   MELANOMA EXCISION WITH SENTINEL LYMPH NODE BIOPSY Left 10/02/2016   WIDE LOCAL EXCISION AND ADVANCEMENT FLAP CLOSURE LEFT UPPER  MELANOMA WITH SENTINEL LYMPH NODE MAPPING AND BIOPSY   SHOULDER ARTHROSCOPY W/ ROTATOR CUFF REPAIR Right 2014   "partial"   STRABISMUS SURGERY Left 1971   STRABISMUS SURGERY Left 03/18/2020   Procedure: STRABISMUS REPAIR;  Surgeon: Everitt Amber, MD;  Location: [place];  Service: Ophthalmology;  Laterality: Left;   TUMOR EXCISION  1984   from tail bone and part of tail bone removed     Current Outpatient Medications:    Ascorbic Acid (VITAMIN C) 1000 MG tablet, Take 1,000 mg by mouth daily., Disp: , Rfl:    Cholecalciferol (D3-1000) 25 MCG (1000 UT) capsule, Take 1,000 Units by mouth daily. , Disp: , Rfl:    CONCERTA 27 MG CR tablet, Take 27 mg by mouth every morning., Disp: , Rfl:  Cyanocobalamin (B-12) 2500 MCG TABS, Take 2,500 mcg by mouth daily. , Disp: , Rfl:    cycloSPORINE (RESTASIS) 0.05 % ophthalmic emulsion, Place 1 drop into both eyes 2 (two) times daily., Disp: , Rfl:    diclofenac Sodium (VOLTAREN) 1 % GEL, Apply 2 g topically 4 (four) times daily., Disp: 50 g, Rfl: 0   estrogens, conjugated, (PREMARIN) 0.45 MG tablet, Take 0.45 mg by mouth daily. Take daily for 21 days then do not take for 7 days., Disp: , Rfl:    insulin aspart (NOVOLOG) 100 UNIT/ML injection, Inject 50-60 Units into the skin See admin instructions. Insulin pump basil rate 50-60 units throughout day. Additional 0-5 units per bolos., Disp: , Rfl:    Insulin Detemir (LEVEMIR FLEXTOUCH) 100 UNIT/ML Pen, Inject 4 Units into the skin 2 (two) times daily as needed (in case pump breaks). , Disp: , Rfl:    Insulin Human (INSULIN PUMP) SOLN, Inject into the skin., Disp: , Rfl:     Levothyroxine Sodium 50 MCG CAPS, Take 25-50 mcg by mouth daily. Depending on thyroid levels., Disp: , Rfl:    Magnesium 400 MG CAPS, Take 400 mg by mouth daily., Disp: , Rfl:    melatonin 1 MG TABS tablet, Take 6 mg by mouth at bedtime., Disp: , Rfl:    Multiple Vitamins-Minerals (MULTIVITAMINS THER. W/MINERALS) TABS, Take 1 tablet by mouth daily., Disp: , Rfl:    Naphazoline HCl (CLEAR EYES OP), Place 1 drop into both eyes daily., Disp: , Rfl:    omeprazole (PRILOSEC) 40 MG capsule, Take 40 mg by mouth daily., Disp: , Rfl:    ondansetron (ZOFRAN ODT) 4 MG disintegrating tablet, Take 1 tablet (4 mg total) by mouth every 8 (eight) hours as needed for nausea or vomiting., Disp: 10 tablet, Rfl: 0   vitamin E 180 MG (400 UNITS) capsule, Take 400 Units by mouth daily., Disp: , Rfl:   Allergies  Allergen Reactions   Doxycycline Other (See Comments)    Elevates liver enzymes    Aspirin Other (See Comments)    Can take 81 mg but has to be coated; No other Aspirin due to bleeding in stools from ulcers   Ciprofloxacin Hives    Per pt, she can tolerate oral but not IV   Nsaids Other (See Comments)    Causes kidney and skin problems Kidney and skin problems   Amoxicillin-Pot Clavulanate Nausea Only   Codeine Nausea And Vomiting   Hydrocodone-Acetaminophen Nausea And Vomiting   Oxycodone Nausea And Vomiting   Sulfa Antibiotics Nausea And Vomiting   Sulfasalazine Nausea And Vomiting          Objective:  Physical Exam  General: AAO x3, NAD  Dermatological: Skin is warm, dry and supple bilateral.  There are no open sores, no preulcerative lesions, no rash or signs of infection present.  Vascular: Dorsalis Pedis artery and Posterior Tibial artery pedal pulses are 2/4 bilateral with immedate capillary fill time. There is no pain with calf compression, swelling, warmth, erythema.   Neruologic: Grossly intact via light touch bilateral.   Musculoskeletal: Soft tissue mass noted which is mobile  on the dorsal medial aspect of the foot there is also a smaller 1 just proximal to this.  There is no crepitation noted.  No erythema overlying this.  Muscular strength 5/5 in all groups tested bilateral.  Gait: Unassisted, Nonantalgic.       Assessment:   Soft tissue mass right foot     Plan:  -Treatment  options discussed including all alternatives, risks, and complications -Etiology of symptoms were discussed -X-rays were obtained and reviewed with the patient.  3 views of the right foot were obtained.  Notes of acute fracture calcifications present. -I discussed with the aspirate the cyst and she wants to proceed with this.  Discussed risks and benefits of this.  The skin was cleaned alcohol and 3 cc of lidocaine, Marcaine plain was infiltrated in a regional block fashion.  Skin was then cleaned with Betadine.  I utilized an 18-gauge needle to aspirate the cyst but unfortunate was not able to identify any fluid.  I tried 2 attempts and no fluid.  I terminated the procedure.  Areas cleaned.  Bandage applied.  Tolerated well.  Postprocedure instructions discussed. -I will order an ultrasound to further evaluate.  Discussed surgical excision which she would likely want to proceed with.  Trula Slade DPM

## 2022-05-24 ENCOUNTER — Other Ambulatory Visit: Payer: Self-pay

## 2022-05-24 ENCOUNTER — Encounter (HOSPITAL_COMMUNITY): Payer: Self-pay | Admitting: Emergency Medicine

## 2022-05-24 ENCOUNTER — Ambulatory Visit (HOSPITAL_COMMUNITY)
Admission: EM | Admit: 2022-05-24 | Discharge: 2022-05-24 | Disposition: A | Discharge status: Home / Self Care | Payer: Medicaid Other | Attending: Internal Medicine | Admitting: Internal Medicine

## 2022-05-24 DIAGNOSIS — N3001 Acute cystitis with hematuria: Secondary | ICD-10-CM | POA: Diagnosis present

## 2022-05-24 LAB — POCT URINALYSIS DIPSTICK, ED / UC
Bilirubin Urine: NEGATIVE
Glucose, UA: NEGATIVE mg/dL
Ketones, ur: NEGATIVE mg/dL
Nitrite: NEGATIVE
Protein, ur: NEGATIVE mg/dL
Specific Gravity, Urine: 1.01 (ref 1.005–1.030)
Urobilinogen, UA: 0.2 mg/dL (ref 0.0–1.0)
pH: 7 (ref 5.0–8.0)

## 2022-05-24 MED ORDER — CIPROFLOXACIN HCL 500 MG PO TABS: 500.0000 mg | ORAL_TABLET | Freq: Two times a day (BID) | ORAL | 0 refills | Status: DC

## 2022-05-24 NOTE — ED Provider Notes (Signed)
[facility]    CSN: [id number] Arrival date & time: 05/24/22  1343      History   Chief Complaint Chief Complaint  Patient presents with   Urinary Tract Infection    HPI Mary Erickson is a 56 y.o. female comes to the urgent care with 1 day history of dysuria urgency and frequency.  Patient is on chronic steroid therapy.  No nausea or vomiting.  No flank pain.  No fever or chills.  Oral intake and appetite is preserved. HPI  Past Medical History:  Diagnosis Date   Acid reflux    Anemia    Arthritis    "elbows, hands, neck, shoulders" (10/02/2016)   Calcium blood increased    Chronic neck pain    Chronic UTI (urinary tract infection)    "from self caths" (10/02/2016)   Hepatitis B    acute hepatitis B from lancet   History of alcoholism ([place])    10/02/2016 "sober since 03/14/2005"   History of blood transfusion 1984; 1989; 2008   "quite a few"   History of shortness of breath    Hypercholesteremia    Hypothyroidism    Internal carotid aneurysm dx'd early 2017   Legally blind in left eye, as defined in [place]    Melanoma [facility])    left upper arm   Migraines    "quite a few in a month; at least 15" (10/02/2016)   Neuropathy    neck; "not sure if it's from DM or Sjogren's" (10/02/2016)   Pneumonia 1986   "1 yr after major OR when I was under anesthesia for 13 hr"   Seizures ([place]) ~ 2004; ~ 2013   "seizures from diabetes "   Self-catheterizes urinary bladder    "since OR in 1984" (10/02/2016)   Sjogren's syndrome ([place])    Thyroid goiter    "still present" (10/02/2016)   Type II diabetes mellitus ([place])    Urinary retention    self caths   Wears glasses     Patient Active Problem List   Diagnosis Date Noted   Hyponatremia 04/17/2021   IBS (irritable bowel syndrome) 04/17/2021   Dizziness 12/11/2019   Colitis 12/11/2019   Abnormal CT scan, colon    Acute colitis 12/08/2019   Orthostatic hypotension 12/08/2019   Type 1 diabetes mellitus ([place])  06/30/2019   Hashimoto's disease 06/30/2019   Sjogren's syndrome ([place]) 06/30/2019   Hx of Hypertension 06/30/2019   Neurogenic bladder 06/30/2019   Malignant melanoma of left upper arm ([place]) 10/02/2016    Past Surgical History:  Procedure Laterality Date   ABDOMINAL HYSTERECTOMY  2008   ECTOPIC PREGNANCY SURGERY  1989   EXCISION MELANOMA WITH SENTINEL LYMPH NODE BIOPSY Left 10/02/2016   Procedure: WIDE LOCAL EXCISION AND ADVANCEMENT FLAP CLOSURE LEFT UPPER  MELANOMA WITH SENTINEL LYMPH NODE MAPPING AND BIOPSY;  Surgeon: [name], MD;  Location: [place];  Service: General;  Laterality: Left;   EYE SURGERY     LAPAROSCOPIC CHOLECYSTECTOMY  ~ 01/2016   MELANOMA EXCISION WITH SENTINEL LYMPH NODE BIOPSY Left 10/02/2016   WIDE LOCAL EXCISION AND ADVANCEMENT FLAP CLOSURE LEFT UPPER  MELANOMA WITH SENTINEL LYMPH NODE MAPPING AND BIOPSY   SHOULDER ARTHROSCOPY W/ ROTATOR CUFF REPAIR Right 2014   "partial"   STRABISMUS SURGERY Left 1971   STRABISMUS SURGERY Left 03/18/2020   Procedure: STRABISMUS REPAIR;  Surgeon: Everitt Amber, MD;  Location: [place];  Service: Ophthalmology;  Laterality: Left;   TUMOR EXCISION  1984   from tail bone and part of tail bone removed    OB History   No obstetric history on file.      Home Medications    Prior to Admission medications   Medication Sig Start Date End Date Taking? Authorizing Provider  ciprofloxacin (CIPRO) 500 MG tablet Take 1 tablet (500 mg total) by mouth every 12 (twelve) hours. 05/24/22  Yes Natascha Edmonds, Myrene Galas, MD  Ascorbic Acid (VITAMIN C) 1000 MG tablet Take 1,000 mg by mouth daily.    [provider]  Cholecalciferol (D3-1000) 25 MCG (1000 UT) capsule Take 1,000 Units by mouth daily.     [provider]  CONCERTA 27 MG CR tablet Take 27 mg by mouth every morning. 09/29/21   [provider]  Cyanocobalamin (B-12) 2500 MCG TABS Take 2,500 mcg by mouth daily.     [provider]   cycloSPORINE (RESTASIS) 0.05 % ophthalmic emulsion Place 1 drop into both eyes 2 (two) times daily. 02/11/20   [provider]  diclofenac Sodium (VOLTAREN) 1 % GEL Apply 2 g topically 4 (four) times daily. 12/17/19   Darr, Edison Nasuti, PA-C  estrogens, conjugated, (PREMARIN) 0.45 MG tablet Take 0.45 mg by mouth daily. Take daily for 21 days then do not take for 7 days.    [provider]  insulin aspart (NOVOLOG) 100 UNIT/ML injection Inject 50-60 Units into the skin See admin instructions. Insulin pump basil rate 50-60 units throughout day. Additional 0-5 units per bolos.    [provider]  Insulin Detemir (LEVEMIR FLEXTOUCH) 100 UNIT/ML Pen Inject 4 Units into the skin 2 (two) times daily as needed (in case pump breaks).  09/27/17   [provider]  Insulin Human (INSULIN PUMP) SOLN Inject into the skin.    [provider]  Levothyroxine Sodium 50 MCG CAPS Take 25-50 mcg by mouth daily. Depending on thyroid levels. 11/10/19   [provider]  Magnesium 400 MG CAPS Take 400 mg by mouth daily.    [provider]  melatonin 1 MG TABS tablet Take 6 mg by mouth at bedtime.    [provider]  Multiple Vitamins-Minerals (MULTIVITAMINS THER. W/MINERALS) TABS Take 1 tablet by mouth daily.    [provider]  Naphazoline HCl (CLEAR EYES OP) Place 1 drop into both eyes daily.    [provider]  omeprazole (PRILOSEC) 40 MG capsule Take 40 mg by mouth daily. 05/04/19   [provider]  ondansetron (ZOFRAN ODT) 4 MG disintegrating tablet Take 1 tablet (4 mg total) by mouth every 8 (eight) hours as needed for nausea or vomiting. 12/07/19   Sherwood Gambler, MD  predniSONE (STERAPRED UNI-PAK 21 TAB) 10 MG (21) TBPK tablet Take by mouth as directed. 04/26/22   [provider]  vitamin E 180 MG (400 UNITS) capsule Take 400 Units by mouth daily.    [provider]    Family History History reviewed. No  pertinent family history.  Social History Social History   Tobacco Use   Smoking status: Former    Packs/day: 1.50    Years: 24.00    Total pack years: 36.00    Types: Cigarettes    Quit date: 2008    Years since quitting: 15.5   Smokeless tobacco: Never  Vaping Use   Vaping Use: Never used  Substance Use Topics   Alcohol use: No    Comment: 10/02/2016 "sober since 03/14/2005"   Drug use: Yes    Types:  Marijuana    Comment: 10/02/2016 " occasional; nothing since before 2006"     Allergies   Doxycycline, Aspirin, Ciprofloxacin, Nsaids, Amoxicillin-pot clavulanate, Codeine, Hydrocodone-acetaminophen, Oxycodone, Sulfa antibiotics, and Sulfasalazine   Review of Systems Review of Systems  Cardiovascular: Negative.   Gastrointestinal: Negative.   Genitourinary:  Positive for dysuria, frequency and urgency. Negative for vaginal discharge and vaginal pain.  Neurological: Negative.      Physical Exam Triage Vital Signs ED Triage Vitals  Enc Vitals Group     BP 05/24/22 1511 (!) 144/81     Pulse Rate 05/24/22 1511 73     Resp 05/24/22 1511 18     Temp 05/24/22 1511 97.7 F (36.5 C)     Temp Source 05/24/22 1511 Oral     SpO2 05/24/22 1511 98 %     Weight --      Height --      Head Circumference --      Peak Flow --      Pain Score 05/24/22 1508 4     Pain Loc --      Pain Edu? --      Excl. in [place]? --    No data found.  Updated Vital Signs BP (!) 144/81 (BP Location: Left Arm)   Pulse 73   Temp 97.7 F (36.5 C) (Oral)   Resp 18   SpO2 98%   Visual Acuity Right Eye Distance:   Left Eye Distance:   Bilateral Distance:    Right Eye Near:   Left Eye Near:    Bilateral Near:     Physical Exam Vitals and nursing note reviewed.  Constitutional:      General: She is not in acute distress.    Appearance: She is not ill-appearing.  Cardiovascular:     Rate and Rhythm: Normal rate and regular rhythm.     Pulses: Normal pulses.     Heart sounds: Normal  heart sounds.  Pulmonary:     Effort: Pulmonary effort is normal.     Breath sounds: Normal breath sounds.  Abdominal:     General: Bowel sounds are normal.     Palpations: Abdomen is soft. There is no mass.     Tenderness: There is no abdominal tenderness.     Hernia: No hernia is present.  Neurological:     Mental Status: She is alert.      UC Treatments / Results  Labs (all labs ordered are listed, but only abnormal results are displayed) Labs Reviewed  POCT URINALYSIS DIPSTICK, ED / UC - Abnormal; Notable for the following components:      Result Value   Hgb urine dipstick SMALL (*)    Leukocytes,Ua MODERATE (*)    All other components within normal limits  URINE CULTURE    EKG   Radiology No results found.  Procedures Procedures (including critical care time)  Medications Ordered in UC Medications - No data to display  Initial Impression / Assessment and Plan / UC Course  I have reviewed the triage vital signs and the nursing notes.  Pertinent labs & imaging results that were available during my care of the patient were reviewed by me and considered in my medical decision making (see chart for details).     1.  Acute cystitis with hematuria: Point-of-care urinalysis is positive for hemoglobin and leukocyte Estrace Urine cultures have been sent Ciprofloxacin 500 mg twice daily for 5 days We will call patient with recommendations if labs are abnormal  Return precautions given. Final Clinical Impressions(s) / UC Diagnoses   Final diagnoses:  Acute cystitis with hematuria     Discharge Instructions      Increase oral fluid intake Take medications as prescribed We will call you with recommendations if labs are abnormal Return to urgent care if symptoms worsen.   ED Prescriptions     Medication Sig Dispense Auth. Provider   ciprofloxacin (CIPRO) 500 MG tablet Take 1 tablet (500 mg total) by mouth every 12 (twelve) hours. 10 tablet Breigh Annett, Myrene Galas, MD      PDMP not reviewed this encounter.   Chase Picket, MD 05/24/22 [phone]

## 2022-05-24 NOTE — ED Triage Notes (Signed)
Reports noticing cloudy, odorous urine this morning.  Reports burning with urination and frequent urination

## 2022-05-24 NOTE — Discharge Instructions (Addendum)
Increase oral fluid intake Take medications as prescribed We will call you with recommendations if labs are abnormal Return to urgent care if symptoms worsen.

## 2022-05-26 LAB — URINE CULTURE: Culture: 50000 — AB

## 2022-07-09 ENCOUNTER — Encounter: Payer: Self-pay | Admitting: Nurse Practitioner

## 2022-07-09 ENCOUNTER — Telehealth: Payer: Self-pay | Admitting: Gastroenterology

## 2022-07-09 NOTE — Telephone Encounter (Signed)
Urgent referral in [facility] for liver hemangioma, liver lesion.  Records in [place].  Dr. Loletha Carrow supervising MD.  Please advise scheduling?

## 2022-07-09 NOTE — Telephone Encounter (Signed)
LM on vmail to call back to schedule 

## 2022-07-09 NOTE — Telephone Encounter (Signed)
This should be scheduled for the next available appt. Can also be scheduled with an APP. Thanks

## 2022-07-19 ENCOUNTER — Ambulatory Visit (HOSPITAL_COMMUNITY): Payer: Medicaid Other

## 2022-08-10 ENCOUNTER — Ambulatory Visit: Payer: Medicaid Other | Admitting: Nurse Practitioner

## 2022-08-17 ENCOUNTER — Ambulatory Visit (HOSPITAL_COMMUNITY): Payer: Medicaid Other

## 2022-08-18 ENCOUNTER — Ambulatory Visit (INDEPENDENT_AMBULATORY_CARE_PROVIDER_SITE_OTHER): Payer: Medicaid Other

## 2022-08-18 ENCOUNTER — Ambulatory Visit (HOSPITAL_COMMUNITY)
Admission: EM | Admit: 2022-08-18 | Discharge: 2022-08-18 | Discharge status: Against Medical Advice | Payer: Medicaid Other | Attending: Internal Medicine | Admitting: Internal Medicine

## 2022-08-18 ENCOUNTER — Encounter (HOSPITAL_COMMUNITY): Payer: Self-pay

## 2022-08-18 DIAGNOSIS — S92515D Nondisplaced fracture of proximal phalanx of left lesser toe(s), subsequent encounter for fracture with routine healing: Secondary | ICD-10-CM | POA: Diagnosis not present

## 2022-08-18 DIAGNOSIS — M79672 Pain in left foot: Secondary | ICD-10-CM

## 2022-08-18 DIAGNOSIS — N3090 Cystitis, unspecified without hematuria: Secondary | ICD-10-CM

## 2022-08-18 DIAGNOSIS — L989 Disorder of the skin and subcutaneous tissue, unspecified: Secondary | ICD-10-CM | POA: Diagnosis not present

## 2022-08-18 DIAGNOSIS — S92515A Nondisplaced fracture of proximal phalanx of left lesser toe(s), initial encounter for closed fracture: Secondary | ICD-10-CM

## 2022-08-18 LAB — POCT URINALYSIS DIPSTICK, ED / UC
Bilirubin Urine: NEGATIVE
Glucose, UA: NEGATIVE mg/dL
Ketones, ur: NEGATIVE mg/dL
Nitrite: NEGATIVE
Protein, ur: NEGATIVE mg/dL
Specific Gravity, Urine: <=1.005 (ref 1.005–1.030)
Urobilinogen, UA: 0.2 mg/dL (ref 0.0–1.0)
pH: 5.5 (ref 5.0–8.0)

## 2022-08-18 MED ORDER — CIPROFLOXACIN HCL 500 MG PO TABS: 500.0000 mg | ORAL_TABLET | Freq: Two times a day (BID) | ORAL | 0 refills | Status: AC

## 2022-08-18 NOTE — Discharge Instructions (Addendum)
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 Please discuss treatment options with your PCP/ endocrinologist.  Your urine appears to be infected. I have called in cipro. Take this twice daily until gone. Increase your water intake. Avoid excessive sun exposure while on the antibiotic.  Your xray shows a healing fracture, however I would like you to follow up with a podiatrist due to the appearance of the soft tissue structures surrounding the break.

## 2022-08-18 NOTE — ED Triage Notes (Addendum)
Patient with c/o UTI. States she straight caths herself at home. Also states she had a toe injury and wants to make sure the toe is healing ok. Also states she has a cyst on her finger that she wants looked at.

## 2022-08-18 NOTE — ED Provider Notes (Signed)
[place]    CSN: [id number] Arrival date & time: 08/18/22  1215      History   Chief Complaint Chief Complaint  Patient presents with   Dysuria    HPI Mary Erickson is a 56 y.o. female.   Pleasant 56yo female with hx of neurogenic bladder who has had to cath herself to urinate since age 4 presents today due to concerns of UTI. Last UTI was in July this year. She cannot urinate independently and must cath. Her typical symptoms that suggest a UTI include cloudy urine and odor. This has been present for several days and getting worse. Denies bladder pain, flank pain, visible hematuria, or fevers. Additionally, pt states she injured her L pinky toe one month ago but read online that you cant do anything about them so didn't come for eval. She is concerned because the area is still very painful and tender. Still very bruised. The area of pain is the distal MTP on 5th pinky toe on L.  Lastly, pt is concerned about a skin colored bump that just occurred on one of her fingers. It is causing some numbness to the tip of her finger. Has had several others in the past.    Dysuria   Past Medical History:  Diagnosis Date   Acid reflux    Anemia    Arthritis    "elbows, hands, neck, shoulders" (10/02/2016)   Calcium blood increased    Chronic neck pain    Chronic UTI (urinary tract infection)    "from self caths" (10/02/2016)   Hepatitis B    acute hepatitis B from lancet   History of alcoholism ([place])    10/02/2016 "sober since 03/14/2005"   History of blood transfusion 1984; 1989; 2008   "quite a few"   History of shortness of breath    Hypercholesteremia    Hypothyroidism    Internal carotid aneurysm dx'd early 2017   Legally blind in left eye, as defined in [place]    Melanoma [facility])    left upper arm   Migraines    "quite a few in a month; at least 15" (10/02/2016)   Neuropathy    neck; "not sure if it's from DM or Sjogren's" (10/02/2016)   Pneumonia 1986    "1 yr after major OR when I was under anesthesia for 13 hr"   Seizures ([place]) ~ 2004; ~ 2013   "seizures from diabetes "   Self-catheterizes urinary bladder    "since OR in 1984" (10/02/2016)   Sjogren's syndrome ([place])    Thyroid goiter    "still present" (10/02/2016)   Type II diabetes mellitus ([place])    Urinary retention    self caths   Wears glasses     Patient Active Problem List   Diagnosis Date Noted   Hyponatremia 04/17/2021   IBS (irritable bowel syndrome) 04/17/2021   Dizziness 12/11/2019   Colitis 12/11/2019   Abnormal CT scan, colon    Acute colitis 12/08/2019   Orthostatic hypotension 12/08/2019   Type 1 diabetes mellitus ([place]) 06/30/2019   Hashimoto's disease 06/30/2019   Sjogren's syndrome ([place]) 06/30/2019   Hx of Hypertension 06/30/2019   Neurogenic bladder 06/30/2019   Malignant melanoma of left upper arm ([place]) 10/02/2016    Past Surgical History:  Procedure Laterality Date   ABDOMINAL HYSTERECTOMY  2008   ECTOPIC PREGNANCY SURGERY  1989   EXCISION MELANOMA WITH SENTINEL LYMPH NODE BIOPSY Left 10/02/2016   Procedure: WIDE LOCAL EXCISION AND  ADVANCEMENT FLAP CLOSURE LEFT UPPER  MELANOMA WITH SENTINEL LYMPH NODE MAPPING AND BIOPSY;  Surgeon: [name], MD;  Location: [place];  Service: General;  Laterality: Left;   EYE SURGERY     LAPAROSCOPIC CHOLECYSTECTOMY  ~ 01/2016   MELANOMA EXCISION WITH SENTINEL LYMPH NODE BIOPSY Left 10/02/2016   WIDE LOCAL EXCISION AND ADVANCEMENT FLAP CLOSURE LEFT UPPER  MELANOMA WITH SENTINEL LYMPH NODE MAPPING AND BIOPSY   SHOULDER ARTHROSCOPY W/ ROTATOR CUFF REPAIR Right 2014   "partial"   STRABISMUS SURGERY Left 1971   STRABISMUS SURGERY Left 03/18/2020   Procedure: STRABISMUS REPAIR;  Surgeon: Everitt Amber, MD;  Location: [place];  Service: Ophthalmology;  Laterality: Left;   TUMOR EXCISION  1984   from tail bone and part of tail bone removed    OB History   No obstetric history on file.      Home  Medications    Prior to Admission medications   Medication Sig Start Date End Date Taking? Authorizing Provider  Ascorbic Acid (VITAMIN C) 1000 MG tablet Take 1,000 mg by mouth daily.    [provider]  Cholecalciferol (D3-1000) 25 MCG (1000 UT) capsule Take 1,000 Units by mouth daily.     [provider]  ciprofloxacin (CIPRO) 500 MG tablet Take 1 tablet (500 mg total) by mouth every 12 (twelve) hours for 5 days. 08/18/22 08/23/22  Yohanna Tow L, PA  CONCERTA 27 MG CR tablet Take 27 mg by mouth every morning. 09/29/21   [provider]  Cyanocobalamin (B-12) 2500 MCG TABS Take 2,500 mcg by mouth daily.     [provider]  cycloSPORINE (RESTASIS) 0.05 % ophthalmic emulsion Place 1 drop into both eyes 2 (two) times daily. 02/11/20   [provider]  diclofenac Sodium (VOLTAREN) 1 % GEL Apply 2 g topically 4 (four) times daily. 12/17/19   Darr, Edison Nasuti, PA-C  estrogens, conjugated, (PREMARIN) 0.45 MG tablet Take 0.45 mg by mouth daily. Take daily for 21 days then do not take for 7 days.    [provider]  insulin aspart (NOVOLOG) 100 UNIT/ML injection Inject 50-60 Units into the skin See admin instructions. Insulin pump basil rate 50-60 units throughout day. Additional 0-5 units per bolos.    [provider]  Insulin Detemir (LEVEMIR FLEXTOUCH) 100 UNIT/ML Pen Inject 4 Units into the skin 2 (two) times daily as needed (in case pump breaks).  09/27/17   [provider]  Insulin Human (INSULIN PUMP) SOLN Inject into the skin.    [provider]  Levothyroxine Sodium 50 MCG CAPS Take 25-50 mcg by mouth daily. Depending on thyroid levels. 11/10/19   [provider]  Magnesium 400 MG CAPS Take 400 mg by mouth daily.    [provider]  melatonin 1 MG TABS tablet Take 6 mg by mouth at bedtime.    [provider]  Multiple Vitamins-Minerals (MULTIVITAMINS THER. W/MINERALS) TABS Take 1 tablet by mouth  daily.    [provider]  Naphazoline HCl (CLEAR EYES OP) Place 1 drop into both eyes daily.    [provider]  omeprazole (PRILOSEC) 40 MG capsule Take 40 mg by mouth daily. 05/04/19   [provider]  ondansetron (ZOFRAN ODT) 4 MG disintegrating tablet Take 1 tablet (4 mg total) by mouth every 8 (eight) hours as needed for nausea or vomiting. 12/07/19   Sherwood Gambler, MD  predniSONE (STERAPRED UNI-PAK 21 TAB) 10 MG (21) TBPK tablet Take by mouth  as directed. 04/26/22   [provider]  vitamin E 180 MG (400 UNITS) capsule Take 400 Units by mouth daily.    [provider]    Family History No family history on file.  Social History Social History   Tobacco Use   Smoking status: Former    Packs/day: 1.50    Years: 24.00    Total pack years: 36.00    Types: Cigarettes    Quit date: 2008    Years since quitting: 15.7   Smokeless tobacco: Never  Vaping Use   Vaping Use: Never used  Substance Use Topics   Alcohol use: No    Comment: 10/02/2016 "sober since 03/14/2005"   Drug use: Yes    Types: Marijuana    Comment: 10/02/2016 " occasional; nothing since before 2006"     Allergies   Doxycycline, Aspirin, Ciprofloxacin, Nsaids, Amoxicillin-pot clavulanate, Codeine, Hydrocodone-acetaminophen, Oxycodone, Sulfa antibiotics, and Sulfasalazine   Review of Systems Review of Systems  Genitourinary:  Positive for dysuria.  As per HPI   Physical Exam Triage Vital Signs ED Triage Vitals  Enc Vitals Group     BP 08/18/22 1233 124/77     Pulse Rate 08/18/22 1233 67     Resp 08/18/22 1233 18     Temp 08/18/22 1233 98.1 F (36.7 C)     Temp Source 08/18/22 1233 Oral     SpO2 08/18/22 1233 98 %     Weight --      Height --      Head Circumference --      Peak Flow --      Pain Score 08/18/22 1232 0     Pain Loc --      Pain Edu? --      Excl. in [place]? --    No data found.  Updated Vital Signs BP 124/77 (BP Location: Left Arm)    Pulse 67   Temp 98.1 F (36.7 C) (Oral)   Resp 18   SpO2 98%   Visual Acuity Right Eye Distance:   Left Eye Distance:   Bilateral Distance:    Right Eye Near:   Left Eye Near:    Bilateral Near:     Physical Exam Vitals and nursing note reviewed.  Constitutional:      Appearance: Normal appearance.  HENT:     Head: Normocephalic and atraumatic.  Cardiovascular:     Rate and Rhythm: Normal rate.  Pulmonary:     Effort: Pulmonary effort is normal. No respiratory distress.  Musculoskeletal:        General: Tenderness (minimal to bruised area) present. Normal range of motion.     Right lower leg: No edema.     Left lower leg: No edema.  Skin:    General: Skin is warm and dry.     Coloration: Skin is not jaundiced or pale.     Findings: Bruising (with minimal localized swelling to 5th distal MTP joint) and lesion (skin colored callus appearing lesion to L lateral ring finger) present. No erythema or rash.  Neurological:     General: No focal deficit present.     Mental Status: She is alert and oriented to person, place, and time.     Sensory: No sensory deficit.     Motor: No weakness.     Gait: Gait normal.      UC Treatments / Results  Labs (all labs ordered are listed, but only abnormal results are displayed) Labs Reviewed  POCT  URINALYSIS DIPSTICK, ED / UC - Abnormal; Notable for the following components:      Result Value   Hgb urine dipstick SMALL (*)    Leukocytes,Ua MODERATE (*)    All other components within normal limits    EKG   Radiology DG Foot Complete Left  Result Date: 08/18/2022 CLINICAL DATA:  Injury foot 1 month ago. Pain and bruising over left 5th toe near MTP EXAM: LEFT FOOT - COMPLETE 3+ VIEW COMPARISON:  None Available. FINDINGS: Healing fracture noted in the midshaft of the left 5th toe proximal phalanx. No significant displacement. Bridging callus noted. No subluxation or dislocation. IMPRESSION: Healing fracture in the left 5th proximal  phalanx. Electronically Signed   By: Rolm Baptise M.D.   On: 08/18/2022 13:31    Procedures Procedures (including critical care time)  Medications Ordered in UC Medications - No data to display  Initial Impression / Assessment and Plan / UC Course  I have reviewed the triage vital signs and the nursing notes.  Pertinent labs & imaging results that were available during my care of the patient were reviewed by me and considered in my medical decision making (see chart for details).     Cystitis - pt with recurrent UTIs secondary to neurogenic bladder and self cathing. Urine cxs in the past have shown E. Coli with no resistant strains.  Patient requesting Cipro by name.  States she tolerates it fine, this is listed on her allergy list, but patient states it caused hives when given in IV form to the veins of her arm only.  Took Cipro 3 months ago and tolerated well. 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  Would recommend the patient follow-up with her PCP or possibly dermatology for further recommendations. Closed nondisplaced fracture of proximal phalanx -at this point, patient has had the bruising and swelling for over 1 month.  It looks to be a periosteal reaction around the healing.  Was going to recommend walking boot and limited ambulation, but patient left prior to discussion.  Therefore, as patient no longer in office after x-ray results available, will encourage patient to follow-up with podiatry for any further recommendations. I did call patient to notify her of her results who states she will follow up with podiatry.    Final Clinical Impressions(s) / UC Diagnoses   Final diagnoses:  Cystitis  Sclerosis of the skin  Closed nondisplaced fracture of proximal phalanx of lesser toe of left foot, initial encounter     Discharge Instructions      The bump on your finger looks most  consistent with digital sclerosis, which can be a manifestation of Type 1 DM. Please discuss treatment options with your PCP/ endocrinologist.  Your urine appears to be infected. I have called in cipro. Take this twice daily until gone. Increase your water intake.      ED Prescriptions     Medication Sig Dispense Auth. Provider   ciprofloxacin (CIPRO) 500 MG tablet Take 1 tablet (500 mg total) by mouth every 12 (twelve) hours for 5 days. 10 tablet Zavier Canela L, [place]      PDMP not reviewed this encounter.   Chaney Malling, [place] 08/18/22 1400

## 2022-11-21 ENCOUNTER — Encounter (HOSPITAL_COMMUNITY): Payer: Self-pay

## 2022-11-21 ENCOUNTER — Ambulatory Visit (HOSPITAL_COMMUNITY)
Admission: RE | Admit: 2022-11-21 | Discharge: 2022-11-21 | Disposition: A | Discharge status: Home / Self Care | Payer: Medicaid Other | Source: Ambulatory Visit | Attending: Family Medicine | Admitting: Family Medicine

## 2022-11-21 VITALS — BP 143/78 | HR 66 | Temp 97.4°F | Resp 16 | Ht 68.0 in | Wt 135.0 lb

## 2022-11-21 DIAGNOSIS — N3090 Cystitis, unspecified without hematuria: Secondary | ICD-10-CM | POA: Insufficient documentation

## 2022-11-21 LAB — POCT URINALYSIS DIPSTICK, ED / UC
Bilirubin Urine: NEGATIVE
Glucose, UA: NEGATIVE mg/dL
Hgb urine dipstick: NEGATIVE
Ketones, ur: NEGATIVE mg/dL
Leukocytes,Ua: NEGATIVE
Nitrite: NEGATIVE
Protein, ur: NEGATIVE mg/dL
Specific Gravity, Urine: 1.015 (ref 1.005–1.030)
Urobilinogen, UA: 0.2 mg/dL (ref 0.0–1.0)
pH: 7.5 (ref 5.0–8.0)

## 2022-11-21 MED ORDER — CIPROFLOXACIN HCL 500 MG PO TABS: 500.0000 mg | ORAL_TABLET | Freq: Two times a day (BID) | ORAL | 0 refills | Status: AC

## 2022-11-21 NOTE — ED Provider Notes (Signed)
[place]    CSN: [id number] Arrival date & time: 11/21/22  1318      History   Chief Complaint Chief Complaint  Patient presents with   Urinary Tract Infection    HPI Mary Erickson is a 57 y.o. female.    Urinary Tract Infection  Here for chills and burning sensation in her mouth.  That is been going on for about 2 weeks, and then today she noted cloudy urine.  Due to neurogenic bladder, she has to self cath since she was very young.  She was seen here in July and in October for similar symptoms.  Culture did show E. coli at 1 point that was sensitive to Cipro.  She has an allergy to IV Cipro, but when she takes oral she does fine.  She has multiple other allergies.  She has chronic nausea, but she does not have any increase in that symptom.  Does also have type 1 diabetes and hypothyroidism.  Past Medical History:  Diagnosis Date   Acid reflux    Anemia    Arthritis    "elbows, hands, neck, shoulders" (10/02/2016)   Calcium blood increased    Chronic neck pain    Chronic UTI (urinary tract infection)    "from self caths" (10/02/2016)   Hepatitis B    acute hepatitis B from lancet   History of alcoholism ([place])    10/02/2016 "sober since 03/14/2005"   History of blood transfusion 1984; 1989; 2008   "quite a few"   History of shortness of breath    Hypercholesteremia    Hypothyroidism    Internal carotid aneurysm dx'd early 2017   Legally blind in left eye, as defined in [place]    Melanoma [facility])    left upper arm   Migraines    "quite a few in a month; at least 15" (10/02/2016)   Neuropathy    neck; "not sure if it's from DM or Sjogren's" (10/02/2016)   Pneumonia 1986   "1 yr after major OR when I was under anesthesia for 13 hr"   Seizures ([place]) ~ 2004; ~ 2013   "seizures from diabetes "   Self-catheterizes urinary bladder    "since OR in 1984" (10/02/2016)   Sjogren's syndrome ([place])    Thyroid goiter    "still present" (10/02/2016)   Type II  diabetes mellitus ([place])    Urinary retention    self caths   Wears glasses     Patient Active Problem List   Diagnosis Date Noted   Hyponatremia 04/17/2021   IBS (irritable bowel syndrome) 04/17/2021   Dizziness 12/11/2019   Colitis 12/11/2019   Abnormal CT scan, colon    Acute colitis 12/08/2019   Orthostatic hypotension 12/08/2019   Type 1 diabetes mellitus ([place]) 06/30/2019   Hashimoto's disease 06/30/2019   Sjogren's syndrome ([place]) 06/30/2019   Hx of Hypertension 06/30/2019   Neurogenic bladder 06/30/2019   Malignant melanoma of left upper arm ([place]) 10/02/2016    Past Surgical History:  Procedure Laterality Date   ABDOMINAL HYSTERECTOMY  2008   ECTOPIC PREGNANCY SURGERY  1989   EXCISION MELANOMA WITH SENTINEL LYMPH NODE BIOPSY Left 10/02/2016   Procedure: WIDE LOCAL EXCISION AND ADVANCEMENT FLAP CLOSURE LEFT UPPER  MELANOMA WITH SENTINEL LYMPH NODE MAPPING AND BIOPSY;  Surgeon: [name], MD;  Location: [place];  Service: General;  Laterality: Left;   EYE SURGERY     LAPAROSCOPIC CHOLECYSTECTOMY  ~ 01/2016   MELANOMA EXCISION WITH  SENTINEL LYMPH NODE BIOPSY Left 10/02/2016   WIDE LOCAL EXCISION AND ADVANCEMENT FLAP CLOSURE LEFT UPPER  MELANOMA WITH SENTINEL LYMPH NODE MAPPING AND BIOPSY   SHOULDER ARTHROSCOPY W/ ROTATOR CUFF REPAIR Right 2014   "partial"   STRABISMUS SURGERY Left 1971   STRABISMUS SURGERY Left 03/18/2020   Procedure: STRABISMUS REPAIR;  Surgeon: Everitt Amber, MD;  Location: [place] [facility];  Service: Ophthalmology;  Laterality: Left;   TUMOR EXCISION  1984   from tail bone and part of tail bone removed    OB History   No obstetric history on file.      Home Medications    Prior to Admission medications   Medication Sig Start Date End Date Taking? Authorizing Provider  Ascorbic Acid (VITAMIN C) 1000 MG tablet Take 1,000 mg by mouth daily.   Yes [provider]  Cholecalciferol (D3-1000) 25 MCG (1000 UT) capsule Take 1,000  Units by mouth daily.    Yes [provider]  ciprofloxacin (CIPRO) 500 MG tablet Take 1 tablet (500 mg total) by mouth 2 (two) times daily for 5 days. 11/21/22 11/26/22 Yes Kadeshia Kasparian, Gwenlyn Perking, MD  CONCERTA 27 MG CR tablet Take 27 mg by mouth every morning. 09/29/21  Yes [provider]  Cyanocobalamin (B-12) 2500 MCG TABS Take 2,500 mcg by mouth daily.    Yes [provider]  cycloSPORINE (RESTASIS) 0.05 % ophthalmic emulsion Place 1 drop into both eyes 2 (two) times daily. 02/11/20  Yes [provider]  diclofenac Sodium (VOLTAREN) 1 % GEL Apply 2 g topically 4 (four) times daily. 12/17/19  Yes Darr, Edison Nasuti, PA-C  estrogens, conjugated, (PREMARIN) 0.45 MG tablet Take 0.45 mg by mouth daily. Take daily for 21 days then do not take for 7 days.   Yes [provider]  insulin aspart (NOVOLOG) 100 UNIT/ML injection Inject 50-60 Units into the skin See admin instructions. Insulin pump basil rate 50-60 units throughout day. Additional 0-5 units per bolos.   Yes [provider]  Insulin Detemir (LEVEMIR FLEXTOUCH) 100 UNIT/ML Pen Inject 4 Units into the skin 2 (two) times daily as needed (in case pump breaks).  09/27/17  Yes [provider]  Insulin Human (INSULIN PUMP) SOLN Inject into the skin.   Yes [provider]  Levothyroxine Sodium 50 MCG CAPS Take 25-50 mcg by mouth daily. Depending on thyroid levels. 11/10/19  Yes [provider]  Magnesium 400 MG CAPS Take 400 mg by mouth daily.   Yes [provider]  melatonin 1 MG TABS tablet Take 6 mg by mouth at bedtime.   Yes [provider]  Multiple Vitamins-Minerals (MULTIVITAMINS THER. W/MINERALS) TABS Take 1 tablet by mouth daily.   Yes [provider]  Naphazoline HCl (CLEAR EYES OP) Place 1 drop into both eyes daily.   Yes [provider]  omeprazole (PRILOSEC) 40 MG capsule Take 40 mg by mouth daily. 05/04/19  Yes [provider]   ondansetron (ZOFRAN ODT) 4 MG disintegrating tablet Take 1 tablet (4 mg total) by mouth every 8 (eight) hours as needed for nausea or vomiting. 12/07/19  Yes Sherwood Gambler, MD  predniSONE (STERAPRED UNI-PAK 21 TAB) 10 MG (21) TBPK tablet Take by mouth as directed. 04/26/22  Yes [provider]  vitamin E 180 MG (400 UNITS) capsule Take 400 Units by mouth daily.   Yes [provider]    Family History History reviewed. No pertinent family history.  Social History Social History   Tobacco  Use   Smoking status: Former    Packs/day: 1.50    Years: 24.00    Total pack years: 36.00    Types: Cigarettes    Quit date: 2008    Years since quitting: 16.0   Smokeless tobacco: Never  Vaping Use   Vaping Use: Never used  Substance Use Topics   Alcohol use: No    Comment: 10/02/2016 "sober since 03/14/2005"   Drug use: Yes    Types: Marijuana    Comment: 10/02/2016 " occasional; nothing since before 2006"     Allergies   Doxycycline, Aspirin, Ciprofloxacin, Nsaids, Amoxicillin-pot clavulanate, Codeine, Hydrocodone-acetaminophen, Oxycodone, Sulfa antibiotics, and Sulfasalazine   Review of Systems Review of Systems   Physical Exam Triage Vital Signs ED Triage Vitals  Enc Vitals Group     BP 11/21/22 1334 (!) 143/78     Pulse Rate 11/21/22 1334 66     Resp 11/21/22 1334 16     Temp 11/21/22 1334 (!) 97.4 F (36.3 C)     Temp Source 11/21/22 1334 Oral     SpO2 11/21/22 1334 98 %     Weight 11/21/22 1333 135 lb (61.2 kg)     Height 11/21/22 1333 '5\' 8"'$  (1.727 m)     Head Circumference --      Peak Flow --      Pain Score 11/21/22 1332 4     Pain Loc --      Pain Edu? --      Excl. in [place]? --    No data found.  Updated Vital Signs BP (!) 143/78 (BP Location: Right Arm)   Pulse 66   Temp (!) 97.4 F (36.3 C) (Oral)   Resp 16   Ht '5\' 8"'$  (1.727 m)   Wt 61.2 kg   SpO2 98%   BMI 20.53 kg/m   Visual Acuity Right Eye Distance:   Left Eye Distance:    Bilateral Distance:    Right Eye Near:   Left Eye Near:    Bilateral Near:     Physical Exam Vitals reviewed.  Constitutional:      General: She is not in acute distress.    Appearance: She is not toxic-appearing.  HENT:     Nose: Nose normal.     Mouth/Throat:     Mouth: Mucous membranes are moist.     Pharynx: No oropharyngeal exudate or posterior oropharyngeal erythema.  Eyes:     Extraocular Movements: Extraocular movements intact.     Conjunctiva/sclera: Conjunctivae normal.     Pupils: Pupils are equal, round, and reactive to light.  Cardiovascular:     Rate and Rhythm: Normal rate and regular rhythm.     Heart sounds: No murmur heard. Pulmonary:     Effort: Pulmonary effort is normal. No respiratory distress.     Breath sounds: No stridor. No wheezing, rhonchi or rales.  Abdominal:     Palpations: Abdomen is soft.     Tenderness: There is no abdominal tenderness. There is no right CVA tenderness or left CVA tenderness.  Musculoskeletal:     Cervical back: Neck supple.  Lymphadenopathy:     Cervical: No cervical adenopathy.  Skin:    Capillary Refill: Capillary refill takes less than 2 seconds.     Coloration: Skin is not jaundiced or pale.  Neurological:     General: No focal deficit present.     Mental Status: She is alert and oriented to person, place, and time.  Psychiatric:  Behavior: Behavior normal.      UC Treatments / Results  Labs (all labs ordered are listed, but only abnormal results are displayed) Labs Reviewed  URINE CULTURE  POCT URINALYSIS DIPSTICK, ED / UC    EKG   Radiology No results found.  Procedures Procedures (including critical care time)  Medications Ordered in UC Medications - No data to display  Initial Impression / Assessment and Plan / UC Course  I have reviewed the triage vital signs and the nursing notes.  Pertinent labs & imaging results that were available during my care of the patient were reviewed by  me and considered in my medical decision making (see chart for details).        Urinalysis this time is negative.  Culture is still sent and empiric treatment is sent in with Cipro for 5 days. Final Clinical Impressions(s) / UC Diagnoses   Final diagnoses:  Cystitis     Discharge Instructions      The urinalysis this time did not show any blood.  Culture is still sent.  Staff will notify you if it looks like antibiotic needs to be changed  Take Cipro 500 mg--1 tablet 2 times daily for 5 days      ED Prescriptions     Medication Sig Dispense Auth. Provider   ciprofloxacin (CIPRO) 500 MG tablet Take 1 tablet (500 mg total) by mouth 2 (two) times daily for 5 days. 10 tablet Windy Carina Gwenlyn Perking, MD      PDMP not reviewed this encounter.   Barrett Henle, MD 11/21/22 [phone]

## 2022-11-21 NOTE — ED Triage Notes (Signed)
Chief Complaint: Patient having chills, burning sensation in her mouth, and cloudy urine. Patient states she self catheters and is prone to UTIs.   Onset: today   Prescriptions or OTC medications tried: Yes- Cipro    with mild relief

## 2022-11-21 NOTE — Discharge Instructions (Signed)
The urinalysis this time did not show any blood.  Culture is still sent.  Staff will notify you if it looks like antibiotic needs to be changed  Take Cipro 500 mg--1 tablet 2 times daily for 5 days

## 2022-11-22 LAB — URINE CULTURE: Culture: NO GROWTH

## 2023-01-31 ENCOUNTER — Emergency Department (HOSPITAL_COMMUNITY)
Admission: EM | Admit: 2023-01-31 | Discharge: 2023-01-31 | Disposition: A | Discharge status: Home / Self Care | Payer: Medicaid Other | Attending: Emergency Medicine | Admitting: Emergency Medicine

## 2023-01-31 ENCOUNTER — Encounter (HOSPITAL_COMMUNITY): Payer: Self-pay | Admitting: *Deleted

## 2023-01-31 ENCOUNTER — Other Ambulatory Visit: Payer: Self-pay

## 2023-01-31 DIAGNOSIS — R109 Unspecified abdominal pain: Secondary | ICD-10-CM

## 2023-01-31 DIAGNOSIS — Z794 Long term (current) use of insulin: Secondary | ICD-10-CM | POA: Diagnosis not present

## 2023-01-31 DIAGNOSIS — R519 Headache, unspecified: Secondary | ICD-10-CM | POA: Insufficient documentation

## 2023-01-31 DIAGNOSIS — E119 Type 2 diabetes mellitus without complications: Secondary | ICD-10-CM | POA: Insufficient documentation

## 2023-01-31 DIAGNOSIS — R110 Nausea: Secondary | ICD-10-CM | POA: Diagnosis not present

## 2023-01-31 DIAGNOSIS — Z79890 Hormone replacement therapy (postmenopausal): Secondary | ICD-10-CM | POA: Insufficient documentation

## 2023-01-31 DIAGNOSIS — E039 Hypothyroidism, unspecified: Secondary | ICD-10-CM | POA: Insufficient documentation

## 2023-01-31 DIAGNOSIS — R1013 Epigastric pain: Secondary | ICD-10-CM | POA: Diagnosis present

## 2023-01-31 LAB — COMPREHENSIVE METABOLIC PANEL
ALT: 34 U/L (ref 0–44)
AST: 36 U/L (ref 15–41)
Albumin: 3.7 g/dL (ref 3.5–5.0)
Alkaline Phosphatase: 82 U/L (ref 38–126)
Anion gap: 14 (ref 5–15)
BUN: 7 mg/dL (ref 6–20)
CO2: 25 mmol/L (ref 22–32)
Calcium: 9.4 mg/dL (ref 8.9–10.3)
Chloride: 100 mmol/L (ref 98–111)
Creatinine, Ser: 0.77 mg/dL (ref 0.44–1.00)
GFR, Estimated: >60 mL/min (ref >60)
Glucose, Bld: 107 mg/dL — ABNORMAL HIGH (ref 70–99)
Potassium: 3.8 mmol/L (ref 3.5–5.1)
Sodium: 139 mmol/L (ref 135–145)
Total Bilirubin: 1.1 mg/dL (ref 0.3–1.2)
Total Protein: 6.8 g/dL (ref 6.5–8.1)

## 2023-01-31 LAB — CBC
HCT: 36.9 % (ref 36.0–46.0)
Hemoglobin: 12.5 g/dL (ref 12.0–15.0)
MCH: 31.6 pg (ref 26.0–34.0)
MCHC: 33.9 g/dL (ref 30.0–36.0)
MCV: 93.4 fL (ref 80.0–100.0)
Platelets: 232 10*3/uL (ref 150–400)
RBC: 3.95 MIL/uL (ref 3.87–5.11)
RDW: 12.6 % (ref 11.5–15.5)
WBC: 11.5 10*3/uL — ABNORMAL HIGH (ref 4.0–10.5)
nRBC: 0 % (ref 0.0–0.2)

## 2023-01-31 LAB — T4, FREE: Free T4: 0.86 ng/dL (ref 0.61–1.12)

## 2023-01-31 LAB — TSH: TSH: 0.728 u[IU]/mL (ref 0.350–4.500)

## 2023-01-31 LAB — CBG MONITORING, ED: Glucose-Capillary: 102 mg/dL — ABNORMAL HIGH (ref 70–99)

## 2023-01-31 LAB — LIPASE, BLOOD: Lipase: 23 U/L (ref 11–51)

## 2023-01-31 MED ORDER — ONDANSETRON HCL 4 MG/2ML IJ SOLN
4.0000 mg | Freq: Once | INTRAMUSCULAR | Status: DC
  Filled 2023-01-31: qty 2

## 2023-01-31 MED ORDER — SODIUM CHLORIDE 0.9 % IV BOLUS
1000.0000 mL | Freq: Once | INTRAVENOUS | Status: AC
  Administered 2023-01-31: 1000 mL via INTRAVENOUS

## 2023-01-31 MED ORDER — DICYCLOMINE HCL 10 MG PO CAPS
10.0000 mg | ORAL_CAPSULE | Freq: Once | ORAL | Status: AC
  Administered 2023-01-31: 10 mg via ORAL
  Filled 2023-01-31: qty 1

## 2023-01-31 MED ORDER — ALUM & MAG HYDROXIDE-SIMETH 200-200-20 MG/5ML PO SUSP
30.0000 mL | Freq: Once | ORAL | Status: AC
  Administered 2023-01-31: 30 mL via ORAL
  Filled 2023-01-31: qty 30

## 2023-01-31 NOTE — ED Notes (Signed)
Introduced self to patient. Gave her warm blankets and a gown to change into. Then will put her on the monitor.

## 2023-01-31 NOTE — ED Provider Notes (Signed)
[facility] Provider Note   CSN: [id number] Arrival date & time: 01/31/23  2005     History  Chief Complaint  Patient presents with   Thyroid Problem    Mary Erickson is a 57 y.o. female.   Thyroid Problem  Patient worries thinking she is in a thyroid storm.  Has been feeling warm/hot.  Headache.  Had nausea.  Some epigastric pain.  Feeling somewhat better now.  Has known overactive thyroid nodules but overall euthyroid.  Due to have consults with general surgery in a week.  States feeling somewhat better now.  Thinks her heart may have been going quickly.  Had some nausea but that is improving.  States her abdomen feels red in the epigastric area going down.    Past Medical History:  Diagnosis Date   Acid reflux    Anemia    Arthritis    "elbows, hands, neck, shoulders" (10/02/2016)   Calcium blood increased    Chronic neck pain    Chronic UTI (urinary tract infection)    "from self caths" (10/02/2016)   Hepatitis B    acute hepatitis B from lancet   History of alcoholism ([place])    10/02/2016 "sober since 03/14/2005"   History of blood transfusion 1984; 1989; 2008   "quite a few"   History of shortness of breath    Hypercholesteremia    Hypothyroidism    Internal carotid aneurysm dx'd early 2017   Legally blind in left eye, as defined in [place]    Melanoma [facility])    left upper arm   Migraines    "quite a few in a month; at least 15" (10/02/2016)   Neuropathy    neck; "not sure if it's from DM or Sjogren's" (10/02/2016)   Pneumonia 1986   "1 yr after major OR when I was under anesthesia for 13 hr"   Seizures ([place]) ~ 2004; ~ 2013   "seizures from diabetes "   Self-catheterizes urinary bladder    "since OR in 1984" (10/02/2016)   Sjogren's syndrome ([place])    Thyroid goiter    "still present" (10/02/2016)   Type II diabetes mellitus ([place])    Urinary retention    self caths   Wears glasses     Home  Medications Prior to Admission medications   Medication Sig Start Date End Date Taking? Authorizing Provider  Ascorbic Acid (VITAMIN C) 1000 MG tablet Take 1,000 mg by mouth daily.    [provider]  Cholecalciferol (D3-1000) 25 MCG (1000 UT) capsule Take 1,000 Units by mouth daily.     [provider]  CONCERTA 27 MG CR tablet Take 27 mg by mouth every morning. 09/29/21   [provider]  Cyanocobalamin (B-12) 2500 MCG TABS Take 2,500 mcg by mouth daily.     [provider]  cycloSPORINE (RESTASIS) 0.05 % ophthalmic emulsion Place 1 drop into both eyes 2 (two) times daily. 02/11/20   [provider]  diclofenac Sodium (VOLTAREN) 1 % GEL Apply 2 g topically 4 (four) times daily. 12/17/19   Darr, Edison Nasuti, PA-C  estrogens, conjugated, (PREMARIN) 0.45 MG tablet Take 0.45 mg by mouth daily. Take daily for 21 days then do not take for 7 days.    [provider]  insulin aspart (NOVOLOG) 100 UNIT/ML injection Inject 50-60 Units into the skin See admin instructions. Insulin pump basil rate 50-60 units throughout day. Additional 0-5 units per bolos.    [provider]  Insulin Detemir (LEVEMIR FLEXTOUCH) 100 UNIT/ML Pen Inject 4 Units into the skin 2 (two) times daily as needed (in case pump breaks).  09/27/17   [provider]  Insulin Human (INSULIN PUMP) SOLN Inject into the skin.    [provider]  Levothyroxine Sodium 50 MCG CAPS Take 25-50 mcg by mouth daily. Depending on thyroid levels. 11/10/19   [provider]  Magnesium 400 MG CAPS Take 400 mg by mouth daily.    [provider]  melatonin 1 MG TABS tablet Take 6 mg by mouth at bedtime.    [provider]  Multiple Vitamins-Minerals (MULTIVITAMINS THER. W/MINERALS) TABS Take 1 tablet by mouth daily.    [provider]  Naphazoline HCl (CLEAR EYES OP) Place 1 drop into both eyes daily.    [provider]  omeprazole (PRILOSEC)  40 MG capsule Take 40 mg by mouth daily. 05/04/19   [provider]  ondansetron (ZOFRAN ODT) 4 MG disintegrating tablet Take 1 tablet (4 mg total) by mouth every 8 (eight) hours as needed for nausea or vomiting. 12/07/19   Sherwood Gambler, MD  predniSONE (STERAPRED UNI-PAK 21 TAB) 10 MG (21) TBPK tablet Take by mouth as directed. 04/26/22   [provider]  vitamin E 180 MG (400 UNITS) capsule Take 400 Units by mouth daily.    [provider]      Allergies    Doxycycline, Aspirin, Ciprofloxacin, Nsaids, Amoxicillin-pot clavulanate, Codeine, Hydrocodone-acetaminophen, Oxycodone, Sulfa antibiotics, and Sulfasalazine    Review of Systems   Review of Systems  Physical Exam Updated Vital Signs BP 132/75   Pulse 73   Temp 98.5 F (36.9 C) (Oral)   Resp (!) 24   Ht 5\' 8"  (1.727 m)   Wt 59 kg   SpO2 98%   BMI 19.77 kg/m  Physical Exam Vitals reviewed.  HENT:     Head: Atraumatic.  Eyes:     Pupils: Pupils are equal, round, and reactive to light.  Cardiovascular:     Rate and Rhythm: Regular rhythm.  Abdominal:     Comments: Mild epigastric abdominal tenderness without rebound or guarding.  No hernia palpated.  Musculoskeletal:        General: No tenderness.     Cervical back: Neck supple.  Skin:    General: Skin is warm.     Capillary Refill: Capillary refill takes less than 2 seconds.  Neurological:     Mental Status: She is alert and oriented to person, place, and time.     ED Results / Procedures / Treatments   Labs (all labs ordered are listed, but only abnormal results are displayed) Labs Reviewed  CBC - Abnormal; Notable for the following components:      Result Value   WBC 11.5 (*)    All other components within normal limits  COMPREHENSIVE METABOLIC PANEL - Abnormal; Notable for the following components:   Glucose, Bld 107 (*)    All other components within normal limits  CBG MONITORING, ED - Abnormal; Notable for the following  components:   Glucose-Capillary 102 (*)    All other components within normal limits  TSH  T4, FREE  LIPASE, BLOOD  T3, FREE    EKG EKG Interpretation  Date/Time:  Thursday January 31 2023 21:12:41 EDT Ventricular Rate:  75 PR Interval:  157 QRS Duration: 89 QT Interval:  366 QTC Calculation: 409 R Axis:   72 Text Interpretation: Sinus rhythm Confirmed by Davonna Belling (  [id number]) on 01/31/2023 10:55:20 PM  Radiology No results found.  Procedures Procedures    Medications Ordered in ED Medications  ondansetron (ZOFRAN) injection 4 mg (4 mg Intravenous Not Given 01/31/23 2211)  sodium chloride 0.9 % bolus 1,000 mL (0 mLs Intravenous Stopped 01/31/23 2219)  dicyclomine (BENTYL) capsule 10 mg (10 mg Oral Given 01/31/23 2211)  alum & mag hydroxide-simeth (MAALOX/MYLANTA) 200-200-20 MG/5ML suspension 30 mL (30 mLs Oral Given 01/31/23 2300)    ED Course/ Medical Decision Making/ A&P                             Medical Decision Making Amount and/or Complexity of Data Reviewed Labs: ordered.  Risk OTC drugs. Prescription drug management.   Patient with various complaints.  Palpitations.  Feeling hot.  Had headache and nausea.  Feeling better now.  Worried she was having a thyroid storm.  Doubt this is thyroid storm.  TSH and T4 reassuring.  Vitals now reassuring also.  Not hypertensive or tachycardic.  Reviewed notes from endocrinology and thyroid scan.  Lab work reassuring.  Feeling better after treatment.  Does have a history of some chronic GI issues.  Doubt obstruction or severe intra-abdominal pathology.  Will discharge home.        Final Clinical Impression(s) / ED Diagnoses Final diagnoses:  Abdominal pain, unspecified abdominal location    Rx / DC Orders ED Discharge Orders     None         Davonna Belling, MD 01/31/23 2307

## 2023-01-31 NOTE — ED Triage Notes (Signed)
Patient reports she feels she is having a thyroid storm.  She is feeling warm all over, has a headache, and had some nausea.  She was also experiencing dizziness but this has resolved.  122/80, HR 90, 99% on room air, cbg 91.   She reports she has not had a storm before.

## 2023-01-31 NOTE — Discharge Instructions (Signed)
The thyroid testing was reassuring today.  This does not appear to be a thyroid storm.  Take your medicine to help with your abdomen. The workup for that was also reassuring.  Follow-up with surgery as planned also.

## 2023-02-02 LAB — T3, FREE: T3, Free: 2.4 pg/mL (ref 2.0–4.4)

## 2023-02-05 ENCOUNTER — Telehealth: Payer: Self-pay | Admitting: Internal Medicine

## 2023-02-05 NOTE — Telephone Encounter (Signed)
Good Afternoon Dr. Lorenso Courier,  Supervising Provider 3/26 PM  We have received a referral for this patient from [facility], [place] for patient to have a colonoscopy. Patient has previous GI hx with [facility], which we have received the records from their office. She is wanting to transfer care here given that we were the recommended office by her provider and would like to stick with their recommendations. Records will be sent to your office, please review them at your earliest convenience and advise on scheduling.  Thank You!

## 2023-02-07 ENCOUNTER — Ambulatory Visit: Payer: Self-pay | Admitting: Surgery

## 2023-02-14 ENCOUNTER — Encounter: Payer: Self-pay | Admitting: Internal Medicine

## 2023-02-14 NOTE — Telephone Encounter (Signed)
OV sched 7/8 at 3:20

## 2023-02-24 ENCOUNTER — Encounter: Payer: Self-pay | Admitting: Surgery

## 2023-02-24 DIAGNOSIS — E0520 Thyrotoxicosis with toxic multinodular goiter without thyrotoxic crisis or storm: Secondary | ICD-10-CM | POA: Diagnosis present

## 2023-02-24 NOTE — H&P (Signed)
REFERRING PHYSICIAN: Betsey Amen, MD  PROVIDER: Kylo Gavin Myra Rude, MD   Chief Complaint: New Consultation (Toxic multinodular goiter)  History of Present Illness:  Patient is referred by Dr. Crista Curb for surgical evaluation and management of toxic multinodular thyroid. Patient has a longstanding history of thyroid disease dating back many years. She had been diagnosed with Hashimoto's thyroiditis. She has experienced episodes of hypothyroidism and hyperthyroidism. Patient has not tolerated methimazole. Over the past years she is experienced about a 30 pound weight loss. She notes tremors. She is on a low-dose beta-blocker with atenolol. Patient underwent an ultrasound examination showing a relatively normal sized thyroid gland with bilateral nodules. Radioactive iodine uptake scan showed patchy uptake with both warm and cold regions within the thyroid. Patient has had no prior head or neck surgery. There is no significant family history of thyroid disease. Patient now presents on referral from her endocrinologist for consideration for thyroidectomy for treatment of hyperthyroidism. Patient does have a history of Sjogren's syndrome which raise concerns about possible treatment with radioactive iodine. Patient denies any significant compressive symptoms. Of note, the patient also is a type I diabetic.  Review of Systems: A complete review of systems was obtained from the patient. I have reviewed this information and discussed as appropriate with the patient. See HPI as well for other ROS.  Review of Systems  Constitutional: Positive for weight loss.  HENT: Positive for sore throat.  Eyes: Negative.  Respiratory: Negative.  Cardiovascular: Negative.  Gastrointestinal: Negative.  Genitourinary: Negative.  Musculoskeletal: Negative.  Skin: Negative.  Neurological: Positive for tremors.  Endo/Heme/Allergies: Negative.  Psychiatric/Behavioral: Negative.    Medical  History: History reviewed. No pertinent past medical history.  Patient Active Problem List  Diagnosis  Toxic multinodular goiter   History reviewed. No pertinent surgical history.   Allergies  Allergen Reactions  Asprin Ec Low Dose [Aspirin] Other (See Comments)  Bleeding  Ciprofloxacin Swelling  Doxycycline Other (See Comments)  High liver enzymes  Sulfa (Sulfonamide Antibiotics) Nausea   Current Outpatient Medications on File Prior to Visit  Medication Sig Dispense Refill  estrogens, conjugated, (PREMARIN) 0.45 MG tablet Take 0.45 mg by mouth once daily  insulin DETEMIR (LEVEMIR FLEXPEN) pen injector (concentration 100 units/mL) INJECT 14 UNITS UNDER THE SKIN TWICE DAILY IN CASE OF PUMP FAILURE  methylphenidate HCl (CONCERTA) 27 MG ER tablet Take 1 tablet by mouth every morning  omeprazole (PRILOSEC) 40 MG DR capsule  SUMAtriptan succinate 6 mg/0.5 mL Crtg Inject subcutaneously   No current facility-administered medications on file prior to visit.   History reviewed. No pertinent family history.   Social History   Tobacco Use  Smoking Status Not on file  Smokeless Tobacco Not on file    Social History   Socioeconomic History  Marital status: Divorced   Objective:   Vitals:  BP: 128/78  Pulse: 82  Temp: 36.9 C (98.4 F)  SpO2: 98%  Weight: 60.2 kg (132 lb 12.8 oz)  Height: 172.7 cm (5\' 8" )  PainSc: 0-No pain   Body mass index is 20.19 kg/m.  Physical Exam   GENERAL APPEARANCE Comfortable, no acute issues Development: normal Gross deformities: none  SKIN Rash, lesions, ulcers: none Induration, erythema: none Nodules: none palpable  EYES Conjunctiva and lids: normal Pupils: equal and reactive  EARS, NOSE, MOUTH, THROAT External ears: no lesion or deformity External nose: no lesion or deformity Hearing: grossly normal  NECK Symmetric: yes Trachea: midline Thyroid: no palpable nodules in the thyroid  bed, no  tenderness  CHEST Respiratory effort: normal Retraction or accessory muscle use: no Breath sounds: normal bilaterally Rales, rhonchi, wheeze: none  CARDIOVASCULAR Auscultation: regular rhythm, normal rate Murmurs: none Pulses: radial pulse 2+ palpable Lower extremity edema: none  ABDOMEN Not assessed  GENITOURINARY/RECTAL Not assessed  MUSCULOSKELETAL Station and gait: normal Digits and nails: no clubbing or cyanosis Muscle strength: grossly normal all extremities Range of motion: grossly normal all extremities Deformity: none Mild tremor bilateral hands  LYMPHATIC Cervical: none palpable Supraclavicular: none palpable  PSYCHIATRIC Oriented to person, place, and time: yes Mood and affect: normal for situation Judgment and insight: appropriate for situation   Assessment and Plan:   Toxic multinodular goiter  Patient is referred by her endocrinologist for surgical evaluation and management of toxic multinodular thyroid with a longstanding history of thyroid disease including Hashimoto's thyroiditis.  Patient provided with a copy of "The Thyroid Book: Medical and Surgical Treatment of Thyroid Problems", published by [employer], 16 pages. Book reviewed and explained to patient during visit today.  Today we reviewed her clinical history. We reviewed her radioactive iodine uptake scan and ultrasound report. We reviewed her recent laboratory studies. Patient has experienced both intervals of hypothyroidism and hyperthyroidism. Currently she is relatively controlled with a TSH level of 0.728. She is not on methimazole. She does take a low-dose of atenolol. Patient would like to avoid treatment with radioactive iodine due to her history of Sjogren syndrome. Therefore we discussed management today with total thyroidectomy. We discussed the procedure, including the risk and benefits. This includes the risk of recurrent laryngeal nerve injury and injury to parathyroid glands. We  discussed the size and location of the surgical incision. We discussed the hospital stay to be anticipated. We discussed her postoperative recovery. We discussed the need for lifelong thyroid hormone replacement. The patient understands and wishes to proceed with surgery in the near future.   Darnell Level, MD [facility] practice Office: [phone]

## 2023-02-24 NOTE — Progress Notes (Signed)
The patient was identified using 2 approved identifiers. All issues noted in this document were discussed and addressed, Ms Moilanen  voiced understanding and agreement with all preoperative instructions. The patient was emailed the surgery instructions per his / her request.   [email or web address]    COVID Vaccine received:  []  No [x]  Yes Date of any COVID positive Test in last 90 days:  PCP - Warnell Forester, PA-C Cardiologist -   Chest x-ray -04-16-2021   2v   Epic  EKG -  02-01-2023  Epic Stress Test -  ECHO -  Cardiac Cath -   PCR screen: []  Ordered & Completed           []   No Order but Needs PROFEND           [x]   N/A for this surgery  Surgery Plan:  []  Ambulatory                            [x]  Outpatient in bed                            []  Admit  Anesthesia:    [x]  General  []  Spinal                           []   Choice []   MAC  Pacemaker / ICD device [x]  No []  Yes   Spinal Cord Stimulator:[x]  No []  Yes       History of Sleep Apnea? [x]  No []  Yes   CPAP used?- [x]  No []  Yes    Does the patient monitor blood sugar?          []  No []  Yes  []  N/A  Patient has: []  NO Hx DM   []  Pre-DM         IDDM   [x]  DM1  []   DM2    Does patient have a [employer] or Dexacom? 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   Anesthesia review: IDDM, s/p strabismus surgery ? Eye, Hashimoto's, Sjogren's, anemia, Remote  seizures- last:     , Remote hx ETOH and HEP B.  Patient denies shortness of breath, fever, cough and chest pain at PAT appointment.  Patient verbalized understanding and agreement to the Pre-Surgical Instructions that were given to them at this PAT appointment. Patient was also educated of the need to review these PAT instructions again prior to her surgery.I reviewed the appropriate phone numbers to call if they have any and questions or concerns.

## 2023-02-24 NOTE — Patient Instructions (Signed)
SURGICAL WAITING ROOM VISITATION Patients having surgery or a procedure may have no more than 2 support people in the waiting area - these visitors may rotate in the visitor waiting room.   Due to an increase in RSV and influenza rates and associated hospitalizations, children ages 38 and under may not visit patients in [facility] hospitals. If the patient needs to stay at the hospital during part of their recovery, the visitor guidelines for inpatient rooms apply.  PRE-OP VISITATION  Pre-op nurse will coordinate an appropriate time for 1 support person to accompany the patient in pre-op.  This support person may not rotate.  This visitor will be contacted when the time is appropriate for the visitor to come back in the pre-op area.  Please refer to the [facility] website for the visitor guidelines for Inpatients (after your surgery is over and you are in a regular room).  You are not required to quarantine at this time prior to your surgery. However, you must do this: Hand Hygiene often Do NOT share personal items Notify your provider if you are in close contact with someone who has COVID or you develop fever 100.4 or greater, new onset of sneezing, cough, sore throat, shortness of breath or body aches.  If you test positive for Covid or have been in contact with anyone that has tested positive in the last 10 days please notify you surgeon.    Your procedure is scheduled on:  Friday  March 01, 2023  Report to [facility] Main Entrance: [place] entrance where the [employer] is available.   Report to admitting at: 05:15 AM  +++++Call this number if you have any questions or problems the morning of surgery [phone]   How to Manage Your Diabetes Before and After Surgery  Why is it important to control my blood sugar before and after surgery? Improving blood sugar levels before and after surgery helps healing and can limit problems. A way of improving blood sugar control  is eating a healthy diet by:  Eating less sugar and carbohydrates  Increasing activity/exercise  Talking with your doctor about reaching your blood sugar goals High blood sugars (greater than 180 mg/dL) can raise your risk of infections and slow your recovery, so you will need to focus on controlling your diabetes during the weeks before surgery. Make sure that the doctor who takes care of your diabetes knows about your planned surgery including the date and location.  How do I manage my blood sugar before surgery? Check your blood sugar at least 4 times a day, starting 2 days before surgery, to make sure that the level is not too high or low. Check your blood sugar the morning of your surgery when you wake up and every 2 hours until you get to the Short Stay unit. If your blood sugar is less than 70 mg/dL, you will need to treat for low blood sugar: Do not take insulin. Treat a low blood sugar (less than 70 mg/dL) with  cup of clear juice (cranberry or apple), 4 glucose tablets, OR glucose gel. Recheck blood sugar in 15 minutes after treatment (to make sure it is greater than 70 mg/dL). If your blood sugar is not greater than 70 mg/dL on recheck, call [phone] for further instructions. Report your blood sugar to the short stay nurse when you get to Short Stay.  If you are admitted to the hospital after surgery: Your blood sugar will be checked by the staff and  you will probably be given insulin after surgery (instead of oral diabetes medicines) to make sure you have good blood sugar levels. The goal for blood sugar control after surgery is 80-180 mg/dL.   WHAT DO I DO ABOUT MY DIABETES MEDICATION?  THE DAY/ NIGHT BEFORE SURGERY:        Insulin Detemir (Levemir)-  Morning:  Take usual dose.  Evening: Take 80% of usual dose. (Ex: 4 units ?3 units)         Insulin Lispro- Day Before take Lispro as usual but not bedtime dose.   THE MORNING OF SURGERY:      Insulin Detemir (Levemir)-Take  80% of usual dose. (Ex: 4 units ?3 units)  Insulin Lispro- If your CBG is greater than 220 mg/dL, you may take  of your sliding scale (correction) dose of insulin.  IF you have any questions, call the nurse at [phone]   Do not eat food after Midnight the night prior to your surgery/procedure.  After Midnight you may have the following liquids until 04:30 AM DAY OF SURGERY  Clear Liquid Diet Water Black Coffee (sugar ok, NO MILK/CREAM OR CREAMERS)  Tea (sugar ok, NO MILK/CREAM OR CREAMERS) regular and decaf                             Plain Jell-O  with no fruit (NO RED)                                           Fruit ices (not with fruit pulp, NO RED)                                     Popsicles (NO RED)                                                                  Juice: apple, WHITE grape, WHITE cranberry Sports drinks like Gatorade or Powerade (NO RED)             FOLLOW  ANY ADDITIONAL PRE OP INSTRUCTIONS YOU RECEIVED FROM YOUR SURGEON'S OFFICE!!!   Oral Hygiene is also important to reduce your risk of infection.        Remember - BRUSH YOUR TEETH THE MORNING OF SURGERY WITH YOUR REGULAR TOOTHPASTE  Do NOT smoke after Midnight the night before surgery.  Take ONLY these medicines the morning of surgery with A SIP OF WATER: Famotidine, Zofran. You may use your Eye Drops if needed.                    You may not have any metal on your body including hair pins, jewelry, and body piercing  Do not wear make-up, lotions, powders, perfumes  or deodorant  Do not wear nail polish including gel and S&S, artificial / acrylic nails, or any other type of covering on natural nails including finger and toenails. If you have artificial nails, gel coating, etc., that needs to be removed by a nail salon, Please have this removed prior to  surgery. Not doing so may mean that your surgery could be cancelled or delayed if the Surgeon or anesthesia staff feels like they are unable to monitor  you safely.   Do not shave 48 hours prior to surgery to avoid nicks in your skin which may contribute to postoperative infections.    Contacts, Hearing Aids, dentures or bridgework may not be worn into surgery. DENTURES WILL BE REMOVED PRIOR TO SURGERY PLEASE DO NOT APPLY "Poly grip" OR ADHESIVES!!!  You may bring a small overnight bag with you on the day of surgery, only pack items that are not valuable. [facility] IS NOT RESPONSIBLE   FOR VALUABLES THAT ARE LOST OR STOLEN.   Do not bring your home medications to the hospital. The Pharmacy will dispense medications listed on your medication list to you during your admission in the Hospital.  Special Instructions: Bring a copy of your healthcare power of attorney and living will documents the day of surgery, if you wish to have them scanned into your [facility] Records- EPIC  Please read over the following fact sheets you were given: IF YOU HAVE QUESTIONS ABOUT YOUR PRE-OP INSTRUCTIONS, PLEASE CALL [phone].   Since you had a Telephone PST interview,  Please follow the following instructions:   [facility] - Preparing for Surgery Before surgery, you can play an important role.  Because skin is not sterile, your skin needs to be as free of germs as possible.  You can reduce the number of germs on your skin by washing with Antibacterial soap before surgery.  . Do not shave (including legs and underarms) for at least 48 hours prior to the first shower.  You may shave your face/neck.  Please follow these instructions carefully:  1.  Shower with antibacterial Soap the night before surgery and the  morning of surgery.  2.  If you choose to wash your hair, wash your hair first as usual with your normal  shampoo.  3.  After you shampoo, rinse your hair and body thoroughly to remove the shampoo.                             4.  You can apply soap directly to the skin and wash.  Gently with a scrungie or clean washcloth.  5.  Wash face,   Genitals (private parts) with your normal soap.             6.  Wash thoroughly, paying special attention to the area where your  surgery  will be performed.  7.  Thoroughly rinse your body with warm water from the neck down.  8.   Pat yourself dry with a clean towel.             9  Wear clean pajamas.            10 Place clean sheets on your bed the night of your first shower and do not  sleep with pets.    ON THE DAY OF SURGERY : Do not apply any lotions/deodorants the morning of surgery.  Please wear clean clothes to the hospital/surgery center.

## 2023-02-25 ENCOUNTER — Encounter (HOSPITAL_COMMUNITY)
Admission: RE | Admit: 2023-02-25 | Discharge: 2023-02-25 | Disposition: A | Discharge status: Home / Self Care | Payer: Medicaid Other | Source: Ambulatory Visit | Attending: Surgery | Admitting: Surgery

## 2023-02-25 ENCOUNTER — Other Ambulatory Visit: Payer: Self-pay

## 2023-02-25 ENCOUNTER — Encounter (HOSPITAL_COMMUNITY): Payer: Self-pay

## 2023-02-25 DIAGNOSIS — E109 Type 1 diabetes mellitus without complications: Secondary | ICD-10-CM

## 2023-02-25 HISTORY — DX: Attention-deficit hyperactivity disorder, unspecified type: F90.9

## 2023-02-25 HISTORY — DX: Autoimmune thyroiditis: E06.3

## 2023-02-28 ENCOUNTER — Encounter (HOSPITAL_COMMUNITY): Payer: Self-pay | Admitting: Surgery

## 2023-02-28 NOTE — Anesthesia Preprocedure Evaluation (Addendum)
Anesthesia Evaluation  Patient identified by MRN, date of birth, ID band Patient awake    Reviewed: Allergy & Precautions, NPO status , Patient's Chart, lab work & pertinent test results  History of Anesthesia Complications Negative for: history of anesthetic complications  Airway Mallampati: II  TM Distance: >3 FB Neck ROM: Full    Dental no notable dental hx. (+) Dental Advisory Given   Pulmonary former smoker   Pulmonary exam normal        Cardiovascular negative cardio ROS Normal cardiovascular exam     Neuro/Psych negative neurological ROS     GI/Hepatic Neg liver ROS,GERD  Medicated,,  Endo/Other  diabetesHypothyroidism    Renal/GU negative Renal ROS     Musculoskeletal negative musculoskeletal ROS (+)    Abdominal   Peds  Hematology negative hematology ROS (+)   Anesthesia Other Findings   Reproductive/Obstetrics                             Anesthesia Physical Anesthesia Plan  ASA: 2  Anesthesia Plan: General   Post-op Pain Management: Tylenol PO (pre-op)* and Toradol IV (intra-op)*   Induction: Intravenous  PONV Risk Score and Plan: 4 or greater and Ondansetron, Dexamethasone, Midazolam and Scopolamine patch - Pre-op  Airway Management Planned: Oral ETT  Additional Equipment:   Intra-op Plan:   Post-operative Plan: Extubation in OR  Informed Consent: I have reviewed the patients History and Physical, chart, labs and discussed the procedure including the risks, benefits and alternatives for the proposed anesthesia with the patient or authorized representative who has indicated his/her understanding and acceptance.     Dental advisory given  Plan Discussed with: Anesthesiologist and CRNA  Anesthesia Plan Comments:         Anesthesia Quick Evaluation

## 2023-03-01 ENCOUNTER — Ambulatory Visit (HOSPITAL_BASED_OUTPATIENT_CLINIC_OR_DEPARTMENT_OTHER): Payer: Medicaid Other | Admitting: Anesthesiology

## 2023-03-01 ENCOUNTER — Other Ambulatory Visit: Payer: Self-pay

## 2023-03-01 ENCOUNTER — Ambulatory Visit (HOSPITAL_COMMUNITY): Payer: Medicaid Other | Admitting: Anesthesiology

## 2023-03-01 ENCOUNTER — Encounter (HOSPITAL_COMMUNITY): Payer: Self-pay | Admitting: Surgery

## 2023-03-01 ENCOUNTER — Ambulatory Visit (HOSPITAL_COMMUNITY)
Admission: RE | Admit: 2023-03-01 | Discharge: 2023-03-02 | Disposition: A | Discharge status: Home / Self Care | Payer: Medicaid Other | Attending: Surgery | Admitting: Surgery

## 2023-03-01 ENCOUNTER — Encounter (HOSPITAL_COMMUNITY)
Admission: RE | Disposition: A | Discharge status: Home / Self Care | Payer: Self-pay | Source: Home / Self Care | Attending: Surgery

## 2023-03-01 DIAGNOSIS — Z79899 Other long term (current) drug therapy: Secondary | ICD-10-CM | POA: Diagnosis not present

## 2023-03-01 DIAGNOSIS — E063 Autoimmune thyroiditis: Secondary | ICD-10-CM | POA: Diagnosis not present

## 2023-03-01 DIAGNOSIS — E109 Type 1 diabetes mellitus without complications: Secondary | ICD-10-CM

## 2023-03-01 DIAGNOSIS — E042 Nontoxic multinodular goiter: Secondary | ICD-10-CM

## 2023-03-01 DIAGNOSIS — E119 Type 2 diabetes mellitus without complications: Secondary | ICD-10-CM

## 2023-03-01 DIAGNOSIS — Z87891 Personal history of nicotine dependence: Secondary | ICD-10-CM

## 2023-03-01 DIAGNOSIS — M3500 Sicca syndrome, unspecified: Secondary | ICD-10-CM | POA: Insufficient documentation

## 2023-03-01 DIAGNOSIS — K219 Gastro-esophageal reflux disease without esophagitis: Secondary | ICD-10-CM | POA: Insufficient documentation

## 2023-03-01 DIAGNOSIS — R251 Tremor, unspecified: Secondary | ICD-10-CM | POA: Diagnosis not present

## 2023-03-01 DIAGNOSIS — E0520 Thyrotoxicosis with toxic multinodular goiter without thyrotoxic crisis or storm: Secondary | ICD-10-CM

## 2023-03-01 HISTORY — PX: THYROIDECTOMY: SHX17

## 2023-03-01 LAB — GLUCOSE, CAPILLARY
Glucose-Capillary: 276 mg/dL — ABNORMAL HIGH (ref 70–99)
Glucose-Capillary: 216 mg/dL — ABNORMAL HIGH (ref 70–99)
Glucose-Capillary: 244 mg/dL — ABNORMAL HIGH (ref 70–99)
Glucose-Capillary: 334 mg/dL — ABNORMAL HIGH (ref 70–99)
Glucose-Capillary: 245 mg/dL — ABNORMAL HIGH (ref 70–99)

## 2023-03-01 LAB — HEMOGLOBIN A1C
Hgb A1c MFr Bld: 6.9 % — ABNORMAL HIGH (ref 4.8–5.6)
Mean Plasma Glucose: 151.33 mg/dL

## 2023-03-01 SURGERY — THYROIDECTOMY
Anesthesia: General | Site: Neck

## 2023-03-01 MED ORDER — INSULIN ASPART 100 UNIT/ML IJ SOLN
INTRAMUSCULAR | Status: AC
  Administered 2023-03-01: 4 [IU]
  Filled 2023-03-01: qty 1

## 2023-03-01 MED ORDER — METHYLPHENIDATE HCL ER (OSM) 18 MG PO TBCR: 18.0000 mg | EXTENDED_RELEASE_TABLET | Freq: Two times a day (BID) | ORAL | Status: DC

## 2023-03-01 MED ORDER — SCOPOLAMINE 1 MG/3DAYS TD PT72
1.0000 | MEDICATED_PATCH | TRANSDERMAL | Status: DC
  Administered 2023-03-01: 1.5 mg via TRANSDERMAL
  Filled 2023-03-01: qty 1

## 2023-03-01 MED ORDER — CHLORHEXIDINE GLUCONATE 0.12 % MT SOLN
15.0000 mL | Freq: Once | OROMUCOSAL | Status: AC
  Administered 2023-03-01: 15 mL via OROMUCOSAL

## 2023-03-01 MED ORDER — MIDAZOLAM HCL 5 MG/5ML IJ SOLN
INTRAMUSCULAR | Status: DC | PRN
  Administered 2023-03-01: 2 mg via INTRAVENOUS

## 2023-03-01 MED ORDER — PROMETHAZINE HCL 25 MG/ML IJ SOLN
6.2500 mg | INTRAMUSCULAR | Status: DC | PRN
  Administered 2023-03-01: 6.25 mg via INTRAVENOUS

## 2023-03-01 MED ORDER — FAMOTIDINE 20 MG PO TABS
20.0000 mg | ORAL_TABLET | Freq: Two times a day (BID) | ORAL | Status: DC
  Administered 2023-03-01 – 2023-03-02 (×2): 20 mg via ORAL
  Filled 2023-03-01 (×2): qty 1

## 2023-03-01 MED ORDER — CHLORHEXIDINE GLUCONATE CLOTH 2 % EX PADS: 6.0000 | MEDICATED_PAD | Freq: Once | CUTANEOUS | Status: DC

## 2023-03-01 MED ORDER — TRAMADOL HCL 50 MG PO TABS: 50.0000 mg | ORAL_TABLET | Freq: Four times a day (QID) | ORAL | Status: DC | PRN

## 2023-03-01 MED ORDER — FENTANYL CITRATE (PF) 100 MCG/2ML IJ SOLN
INTRAMUSCULAR | Status: AC
  Filled 2023-03-01: qty 2

## 2023-03-01 MED ORDER — HYDROMORPHONE HCL 1 MG/ML IJ SOLN
1.0000 mg | INTRAMUSCULAR | Status: DC | PRN
  Administered 2023-03-01 (×4): 1 mg via INTRAVENOUS
  Filled 2023-03-01 (×4): qty 1

## 2023-03-01 MED ORDER — MELATONIN 10-10 MG PO TBCR: 10.0000 mg | EXTENDED_RELEASE_TABLET | Freq: Every day | ORAL | Status: DC

## 2023-03-01 MED ORDER — PROPOFOL 10 MG/ML IV BOLUS
INTRAVENOUS | Status: AC
  Filled 2023-03-01: qty 20

## 2023-03-01 MED ORDER — LACTATED RINGERS IV SOLN: INTRAVENOUS | Status: DC

## 2023-03-01 MED ORDER — LEVOTHYROXINE SODIUM 88 MCG PO TABS: 88.0000 ug | ORAL_TABLET | Freq: Every day | ORAL | 3 refills | Status: DC

## 2023-03-01 MED ORDER — SODIUM CHLORIDE 0.45 % IV SOLN: INTRAVENOUS | Status: DC

## 2023-03-01 MED ORDER — LIDOCAINE 2% (20 MG/ML) 5 ML SYRINGE
INTRAMUSCULAR | Status: DC | PRN
  Administered 2023-03-01: 80 mg via INTRAVENOUS

## 2023-03-01 MED ORDER — CALCIUM CARBONATE 1250 (500 CA) MG PO TABS
2.0000 | ORAL_TABLET | Freq: Three times a day (TID) | ORAL | Status: DC
  Administered 2023-03-01 – 2023-03-02 (×2): 2500 mg via ORAL
  Filled 2023-03-01 (×2): qty 2

## 2023-03-01 MED ORDER — FENTANYL CITRATE PF 50 MCG/ML IJ SOSY
PREFILLED_SYRINGE | INTRAMUSCULAR | Status: AC
  Filled 2023-03-01: qty 1

## 2023-03-01 MED ORDER — ONDANSETRON HCL 4 MG/2ML IJ SOLN
INTRAMUSCULAR | Status: DC | PRN
  Administered 2023-03-01: 4 mg via INTRAVENOUS

## 2023-03-01 MED ORDER — ONDANSETRON HCL 4 MG/2ML IJ SOLN
4.0000 mg | Freq: Four times a day (QID) | INTRAMUSCULAR | Status: DC | PRN
  Administered 2023-03-01: 4 mg via INTRAVENOUS
  Filled 2023-03-01: qty 2

## 2023-03-01 MED ORDER — DEXMEDETOMIDINE HCL IN NACL 80 MCG/20ML IV SOLN
INTRAVENOUS | Status: DC | PRN
  Administered 2023-03-01: 8 ug via BUCCAL

## 2023-03-01 MED ORDER — EPHEDRINE 5 MG/ML INJ
INTRAVENOUS | Status: AC
  Filled 2023-03-01: qty 5

## 2023-03-01 MED ORDER — DEXAMETHASONE SODIUM PHOSPHATE 10 MG/ML IJ SOLN
INTRAMUSCULAR | Status: DC | PRN
  Administered 2023-03-01: 6 mg via INTRAVENOUS

## 2023-03-01 MED ORDER — ACETAMINOPHEN 650 MG RE SUPP: 650.0000 mg | Freq: Four times a day (QID) | RECTAL | Status: DC | PRN

## 2023-03-01 MED ORDER — ACETAMINOPHEN 500 MG PO TABS
1000.0000 mg | ORAL_TABLET | Freq: Once | ORAL | Status: AC
  Administered 2023-03-01: 1000 mg via ORAL
  Filled 2023-03-01: qty 2

## 2023-03-01 MED ORDER — 0.9 % SODIUM CHLORIDE (POUR BTL) OPTIME
TOPICAL | Status: DC | PRN
  Administered 2023-03-01: 1000 mL

## 2023-03-01 MED ORDER — AMISULPRIDE (ANTIEMETIC) 5 MG/2ML IV SOLN: 10.0000 mg | Freq: Once | INTRAVENOUS | Status: DC | PRN

## 2023-03-01 MED ORDER — INSULIN ASPART 100 UNIT/ML IJ SOLN
0.0000 [IU] | Freq: Three times a day (TID) | INTRAMUSCULAR | Status: DC
  Administered 2023-03-01: 11 [IU] via SUBCUTANEOUS
  Administered 2023-03-02: 8 [IU] via SUBCUTANEOUS

## 2023-03-01 MED ORDER — MELATONIN 5 MG PO TABS
10.0000 mg | ORAL_TABLET | Freq: Every day | ORAL | Status: DC
  Administered 2023-03-01: 10 mg via ORAL
  Filled 2023-03-01: qty 2

## 2023-03-01 MED ORDER — PREDNISONE 5 MG PO TABS
5.0000 mg | ORAL_TABLET | Freq: Two times a day (BID) | ORAL | Status: DC
  Administered 2023-03-01 – 2023-03-02 (×2): 5 mg via ORAL
  Filled 2023-03-01 (×2): qty 1

## 2023-03-01 MED ORDER — INSULIN ASPART 100 UNIT/ML IJ SOLN
INTRAMUSCULAR | Status: AC
  Filled 2023-03-01: qty 1

## 2023-03-01 MED ORDER — FENTANYL CITRATE PF 50 MCG/ML IJ SOSY
25.0000 ug | PREFILLED_SYRINGE | INTRAMUSCULAR | Status: DC | PRN
  Administered 2023-03-01 (×2): 50 ug via INTRAVENOUS

## 2023-03-01 MED ORDER — ACETAMINOPHEN 325 MG PO TABS: 650.0000 mg | ORAL_TABLET | Freq: Four times a day (QID) | ORAL | Status: DC | PRN

## 2023-03-01 MED ORDER — TRAMADOL HCL 50 MG PO TABS
50.0000 mg | ORAL_TABLET | Freq: Four times a day (QID) | ORAL | Status: DC | PRN
  Administered 2023-03-01 – 2023-03-02 (×2): 50 mg via ORAL
  Filled 2023-03-01 (×2): qty 1

## 2023-03-01 MED ORDER — DEXMEDETOMIDINE HCL IN NACL 80 MCG/20ML IV SOLN
INTRAVENOUS | Status: AC
  Filled 2023-03-01: qty 20

## 2023-03-01 MED ORDER — CALCIUM CARBONATE ANTACID 500 MG PO CHEW: 2.0000 | CHEWABLE_TABLET | Freq: Two times a day (BID) | ORAL | 1 refills | Status: DC

## 2023-03-01 MED ORDER — FENTANYL CITRATE (PF) 100 MCG/2ML IJ SOLN
INTRAMUSCULAR | Status: DC | PRN
  Administered 2023-03-01: 100 ug via INTRAVENOUS

## 2023-03-01 MED ORDER — MIDAZOLAM HCL 2 MG/2ML IJ SOLN
INTRAMUSCULAR | Status: AC
  Filled 2023-03-01: qty 2

## 2023-03-01 MED ORDER — HEMOSTATIC AGENTS (NO CHARGE) OPTIME
TOPICAL | Status: DC | PRN
  Administered 2023-03-01: 1 via TOPICAL

## 2023-03-01 MED ORDER — KETOTIFEN FUMARATE 0.035 % OP SOLN
1.0000 [drp] | Freq: Two times a day (BID) | OPHTHALMIC | Status: DC
  Administered 2023-03-01 – 2023-03-02 (×2): 1 [drp] via OPHTHALMIC
  Filled 2023-03-01: qty 5

## 2023-03-01 MED ORDER — CEFAZOLIN SODIUM-DEXTROSE 2-4 GM/100ML-% IV SOLN
2.0000 g | INTRAVENOUS | Status: AC
  Administered 2023-03-01: 2 g via INTRAVENOUS
  Filled 2023-03-01: qty 100

## 2023-03-01 MED ORDER — PROPOFOL 10 MG/ML IV BOLUS
INTRAVENOUS | Status: DC | PRN
  Administered 2023-03-01: 170 mg via INTRAVENOUS

## 2023-03-01 MED ORDER — SUGAMMADEX SODIUM 200 MG/2ML IV SOLN
INTRAVENOUS | Status: DC | PRN
  Administered 2023-03-01: 200 mg via INTRAVENOUS

## 2023-03-01 MED ORDER — EPHEDRINE SULFATE-NACL 50-0.9 MG/10ML-% IV SOSY
PREFILLED_SYRINGE | INTRAVENOUS | Status: DC | PRN
  Administered 2023-03-01 (×3): 10 mg via INTRAVENOUS

## 2023-03-01 MED ORDER — PROMETHAZINE HCL 25 MG/ML IJ SOLN
INTRAMUSCULAR | Status: AC
  Filled 2023-03-01: qty 1

## 2023-03-01 MED ORDER — ONDANSETRON 4 MG PO TBDP
4.0000 mg | ORAL_TABLET | Freq: Four times a day (QID) | ORAL | Status: DC | PRN
  Administered 2023-03-02: 4 mg via ORAL
  Filled 2023-03-01: qty 1

## 2023-03-01 MED ORDER — OXYCODONE HCL 5 MG PO TABS
5.0000 mg | ORAL_TABLET | ORAL | Status: DC | PRN
  Filled 2023-03-01: qty 2

## 2023-03-01 MED ORDER — ESTROGENS CONJUGATED 0.45 MG PO TABS
0.4500 mg | ORAL_TABLET | Freq: Every day | ORAL | Status: DC
  Administered 2023-03-02: 0.45 mg via ORAL
  Filled 2023-03-01 (×2): qty 1

## 2023-03-01 MED ORDER — ORAL CARE MOUTH RINSE: 15.0000 mL | Freq: Once | OROMUCOSAL | Status: AC

## 2023-03-01 MED ORDER — ROCURONIUM BROMIDE 10 MG/ML (PF) SYRINGE
PREFILLED_SYRINGE | INTRAVENOUS | Status: DC | PRN
  Administered 2023-03-01: 10 mg via INTRAVENOUS
  Administered 2023-03-01: 60 mg via INTRAVENOUS

## 2023-03-01 MED ORDER — PROPOFOL 1000 MG/100ML IV EMUL
INTRAVENOUS | Status: AC
  Filled 2023-03-01: qty 100

## 2023-03-01 SURGICAL SUPPLY — 32 items
ADH SKN CLS APL DERMABOND .7 (GAUZE/BANDAGES/DRESSINGS) ×1
APL PRP STRL LF DISP 70% ISPRP (MISCELLANEOUS) ×1
ATTRACTOMAT 16X20 MAGNETIC DRP (DRAPES) ×1 IMPLANT
BAG COUNTER SPONGE SURGICOUNT (BAG) ×1 IMPLANT
BAG SPNG CNTER NS LX DISP (BAG) ×1
BLADE SURG 15 STRL LF DISP TIS (BLADE) ×1 IMPLANT
BLADE SURG 15 STRL SS (BLADE) ×1
CHLORAPREP W/TINT 26 (MISCELLANEOUS) ×1 IMPLANT
CLIP TI MEDIUM 6 (CLIP) ×2 IMPLANT
CLIP TI WIDE RED SMALL 6 (CLIP) ×2 IMPLANT
COVER SURGICAL LIGHT HANDLE (MISCELLANEOUS) ×1 IMPLANT
DERMABOND ADVANCED .7 DNX12 (GAUZE/BANDAGES/DRESSINGS) ×1 IMPLANT
DRAPE LAPAROTOMY T 98X78 PEDS (DRAPES) ×1 IMPLANT
DRAPE UTILITY XL STRL (DRAPES) ×1 IMPLANT
ELECT PENCIL ROCKER SW 15FT (MISCELLANEOUS) ×1 IMPLANT
ELECT REM PT RETURN 15FT ADLT (MISCELLANEOUS) ×1 IMPLANT
GAUZE 4X4 16PLY ~~LOC~~+RFID DBL (SPONGE) ×1 IMPLANT
GLOVE SURG ORTHO 8.0 STRL STRW (GLOVE) ×1 IMPLANT
GOWN STRL REUS W/ TWL XL LVL3 (GOWN DISPOSABLE) ×2 IMPLANT
GOWN STRL REUS W/TWL XL LVL3 (GOWN DISPOSABLE) ×2
HEMOSTAT SURGICEL 2X4 FIBR (HEMOSTASIS) ×1 IMPLANT
ILLUMINATOR WAVEGUIDE N/F (MISCELLANEOUS) ×1 IMPLANT
KIT BASIN OR (CUSTOM PROCEDURE TRAY) ×1 IMPLANT
KIT TURNOVER KIT A (KITS) IMPLANT
PACK BASIC VI WITH GOWN DISP (CUSTOM PROCEDURE TRAY) ×1 IMPLANT
SHEARS HARMONIC 9CM CVD (BLADE) ×1 IMPLANT
SUT MNCRL AB 4-0 PS2 18 (SUTURE) ×1 IMPLANT
SUT VIC AB 3-0 SH 18 (SUTURE) ×2 IMPLANT
SYR BULB IRRIG 60ML STRL (SYRINGE) ×1 IMPLANT
TOWEL OR 17X26 10 PK STRL BLUE (TOWEL DISPOSABLE) ×1 IMPLANT
TOWEL OR NON WOVEN STRL DISP B (DISPOSABLE) ×1 IMPLANT
TUBING CONNECTING 10 (TUBING) ×1 IMPLANT

## 2023-03-01 NOTE — Inpatient Diabetes Management (Signed)
Inpatient Diabetes Program Recommendations  AACE/ADA: New Consensus Statement on Inpatient Glycemic Control (2015)  Target Ranges:  Prepandial:   less than 140 mg/dL      Peak postprandial:   less than 180 mg/dL (1-2 hours)      Critically ill patients:  140 - 180 mg/dL   Lab Results  Component Value Date   GLUCAP 244 (H) 03/01/2023   HGBA1C 6.6 (H) 04/17/2021    Review of Glycemic Control  Latest Reference Range & Units 03/01/23 05:47 03/01/23 08:21 03/01/23 09:40  Glucose-Capillary 70 - 99 mg/dL 161 (H) 096 (H) 045 (H)  (H): Data is abnormally high  Diabetes history: DM Outpatient Diabetes medications: Levemir 4 units BID, Humalog Jr. 1-7 units PRN Current orders for Inpatient glycemic control: Novolog 0-15 units TID, Prednisone 5 mg BID, Had decadron 6 mg this morning.    Inpatient Diabetes Program Recommendations:    Semglee 4 units QD (50% of home dose)  Will continue to follow while inpatient.  Thank you, Dulce Sellar, MSN, CDCES Diabetes Coordinator Inpatient Diabetes Program [phone] (team pager from 8a-5p)

## 2023-03-01 NOTE — Interval H&P Note (Signed)
History and Physical Interval Note:  03/01/2023 7:01 AM  Mary Erickson  has presented today for surgery, with the diagnosis of TOXIC MULTINODULAR THYROID.  The various methods of treatment have been discussed with the patient and family. After consideration of risks, benefits and other options for treatment, the patient has consented to    Procedure(s): TOTAL THYROIDECTOMY (N/A) as a surgical intervention.    The patient's history has been reviewed, patient examined, no change in status, stable for surgery.  I have reviewed the patient's chart and labs.  Questions were answered to the patient's satisfaction.    Darnell Level, MD [facility] practice Office: [phone]   Darnell Level

## 2023-03-01 NOTE — Transfer of Care (Signed)
Immediate Anesthesia Transfer of Care Note  Patient: Mary Erickson  Procedure(s) Performed: Procedure(s): TOTAL THYROIDECTOMY (N/A)  Patient Location: PACU  Anesthesia Type:General  Level of Consciousness:  sedated, patient cooperative and responds to stimulation  Airway & Oxygen Therapy:Patient Spontanous Breathing and Patient connected to face mask oxgen  Post-op Assessment:  Report given to PACU RN and Post -op Vital signs reviewed and stable  Post vital signs:  Reviewed and stable  Last Vitals:  Vitals:   03/01/23 0548 03/01/23 0919  BP: 134/89 (!) 147/86  Pulse: (!) 59 79  Resp: 18 13  Temp: 36.4 C 36.7 C  SpO2: 99% 100%    Complications: No apparent anesthesia complications

## 2023-03-01 NOTE — Op Note (Signed)
Procedure Note  Pre-operative Diagnosis:  toxic multinodular goiter  Post-operative Diagnosis:  same  Surgeon:  Darnell Level, MD  Assistant:  none   Procedure:  Total thyroidectomy  Anesthesia:  General  Estimated Blood Loss:  20 cc  Drains: none         Specimen: thyroid to pathology  Indications:  Patient is referred by Dr. Crista Curb for surgical evaluation and management of toxic multinodular thyroid. Patient has a longstanding history of thyroid disease dating back many years. She had been diagnosed with Hashimoto's thyroiditis. She has experienced episodes of hypothyroidism and hyperthyroidism. Patient has not tolerated methimazole. Over the past years she is experienced about a 30 pound weight loss. She notes tremors. She is on a low-dose beta-blocker with atenolol. Patient underwent an ultrasound examination showing a relatively normal sized thyroid gland with bilateral nodules. Radioactive iodine uptake scan showed patchy uptake with both warm and cold regions within the thyroid. Patient has had no prior head or neck surgery. There is no significant family history of thyroid disease. Patient now presents on referral from her endocrinologist for consideration for thyroidectomy for treatment of hyperthyroidism. Patient does have a history of Sjogren's syndrome which raise concerns about possible treatment with radioactive iodine. Patient denies any significant compressive symptoms. Of note, the patient also is a type I diabetic.   Procedure Details: Procedure was done in OR #1 at the [facility]. The patient was brought to the operating room and placed in a supine position on the operating room table. Following administration of general anesthesia, the patient was positioned and then prepped and draped in the usual aseptic fashion. After ascertaining that an adequate level of anesthesia had been achieved, a small Kocher incision was made with #15 blade. Dissection was carried  through subcutaneous tissues and platysma.Hemostasis was achieved with the electrocautery. Skin flaps were elevated cephalad and caudad from the thyroid notch to the sternal notch. A Mahorner self-retaining retractor was placed for exposure. Strap muscles were incised in the midline and dissection was begun on the left side.  Strap muscles were reflected laterally.  Left thyroid lobe was normal in size with small nodules.  The left lobe was gently mobilized with blunt dissection. Superior pole vessels were dissected out and divided individually between small and medium ligaclips with the harmonic scalpel. The thyroid lobe was rolled anteriorly. Branches of the inferior thyroid artery were divided between small ligaclips with the harmonic scalpel. Inferior venous tributaries were divided between ligaclips. Both the superior and inferior parathyroid glands were identified and preserved on their vascular pedicles. The recurrent laryngeal nerve was identified and preserved along its course. The ligament of Allyson Sabal was released with the electrocautery and the gland was mobilized onto the anterior trachea. Isthmus was mobilized across the midline. There was no significant pyramidal lobe present. Dry pack was placed in the left neck.  The right thyroid lobe was gently mobilized with blunt dissection. Right thyroid lobe was slightly enlarged with nodules. Superior pole vessels were dissected out and divided between small and medium ligaclips with the Harmonic scalpel. Superior parathyroid was identified and preserved. Inferior venous tributaries were divided between medium ligaclips with the harmonic scalpel. The right thyroid lobe was rolled anteriorly and the branches of the inferior thyroid artery divided between small ligaclips. The right recurrent laryngeal nerve was identified and preserved along its course. The ligament of Allyson Sabal was released with the electrocautery. The right thyroid lobe was mobilized onto the  anterior trachea and the remainder  of the thyroid was dissected off the anterior trachea and the thyroid was completely excised. A suture was used to mark the right lobe. The entire thyroid gland was submitted to pathology for review.  Palpation of the operative field demonstrated no evidence of residual disease and no abnormal lymph nodes.  The neck was irrigated with warm saline. Fibrillar was placed throughout the operative field. Strap muscles were approximated in the midline with interrupted 3-0 Vicryl sutures. Platysma was closed with interrupted 3-0 Vicryl sutures. Skin was closed with a running 4-0 Monocryl subcuticular suture. Wound was washed and Dermabond was applied. The patient was awakened from anesthesia and brought to the recovery room. The patient tolerated the procedure well.   Darnell Level, MD [facility] Office: [phone]

## 2023-03-01 NOTE — TOC CM/SW Note (Signed)
Transition of Care (TOC) Screening Note  Patient Details  Name: Mary Erickson Date of Birth: 04/09/1966  Transition of Care [facility]) CM/SW Contact:    Ewing Schlein, LCSW Phone Number: 03/01/2023, 1:28 PM  Transition of Care Department [facility]) has reviewed patient and no TOC needs have been identified at this time. We will continue to monitor patient advancement through interdisciplinary progression rounds. If new patient transition needs arise, please place a TOC consult.

## 2023-03-01 NOTE — Discharge Instructions (Signed)
[facility] - Dr. Sayler Mickiewicz  THYROID & PARATHYROID SURGERY:  POST-OP INSTRUCTIONS  Always review the instruction sheet provided by the hospital nurse at discharge.  A prescription for pain medication may be sent to your pharmacy at the time of discharge.  Take your pain medication as prescribed.  If narcotic pain medicine is not needed, then you may take acetaminophen (Tylenol) or ibuprofen (Advil) as needed for pain or soreness.  Take your normal home medications as prescribed unless otherwise directed.  If you need a refill on your pain medication, please contact the office during regular business hours.  Prescriptions will not be processed by the office after 5:00PM or on weekends.  Start with a light diet upon arrival home, such as soup and crackers or toast.  Be sure to drink plenty of fluids.  Resume your normal diet the day after surgery.  Most patients will experience some swelling and bruising on the chest and neck area.  Ice packs will help for the first 48 hours after arriving home.  Swelling and bruising will take several days to resolve.   It is common to experience some constipation after surgery.  Increasing fluid intake and taking a stool softener (Colace) will usually help to prevent this problem.  A mild laxative (Milk of Magnesia or Miralax) should be taken according to package directions if there has been no bowel movement after 48 hours.  Dermabond glue covers your incision. This seals the wound and you may shower at any time. The Dermabond will remain in place for about a week.  You may gradually remove the glue when it loosens around the edges.  If you need to loosen the Dermabond for removal, apply a layer of Vaseline to the wound for 15 minutes and then remove with a Kleenex. Your sutures are under the skin and will not show - they will dissolve on their own.  You may resume light daily activities beginning the day after discharge (such as self-care,  walking, climbing stairs), gradually increasing activities as tolerated. You may have sexual intercourse when it is comfortable. Refrain from any heavy lifting or straining until approved by your doctor. You may drive when you no longer are taking prescription pain medication, you can comfortably wear a seatbelt, and you can safely maneuver your car and apply the brakes.  You will see your doctor in the office for a follow-up appointment approximately three weeks after your surgery.  Make sure that you call for this appointment within a day or two after you arrive home to insure a convenient appointment time. Please have any requested laboratory tests performed a few days prior to your office visit so that the results will be available at your follow up appointment.  WHEN TO CALL THE [facility] OFFICE: -- Fever greater than 101.5 -- Inability to urinate -- Nausea and/or vomiting - persistent -- Extreme swelling or bruising -- Continued bleeding from incision -- Increased pain, redness, or drainage from the incision -- Difficulty swallowing or breathing -- Muscle cramping or spasms -- Numbness or tingling in hands or around lips  The clinic staff is available to answer your questions during regular business hours.  Please don't hesitate to call and ask to speak to one of the nurses if you have concerns.  [facility] OFFICE: [phone] (24 hours)  Please sign up for MyChart accounts. This will allow you to communicate directly with my nurse or myself without having to call the office. It will also allow you   to view your test results. You will need to enroll in MyChart for my office ([facility]) and for the hospital ([place]).  Mary Vandenbrink, MD [facility] A [facility] practice 

## 2023-03-01 NOTE — OR Nursing (Signed)
Patient brought humalog junior kwikpen from home. Does not have family at bedside at this time. Medication stored by pharmacy form filled out and humalog kwikpen taken to pharmacy. Copy of form placed in patient chart.

## 2023-03-01 NOTE — Anesthesia Postprocedure Evaluation (Signed)
Anesthesia Post Note  Patient: Mary Erickson  Procedure(s) Performed: TOTAL THYROIDECTOMY (Neck)     Patient location during evaluation: PACU Anesthesia Type: General Level of consciousness: sedated Pain management: pain level controlled Vital Signs Assessment: post-procedure vital signs reviewed and stable Respiratory status: spontaneous breathing and respiratory function stable Cardiovascular status: stable Postop Assessment: no apparent nausea or vomiting Anesthetic complications: no  No notable events documented.  Last Vitals:  Vitals:   03/01/23 1015 03/01/23 1034  BP: (!) 122/59 138/75  Pulse: 75 68  Resp: 17   Temp:    SpO2: 98% 94%    Last Pain:  Vitals:   03/01/23 1015  TempSrc:   PainSc: Asleep                 Amalio Loe DANIEL

## 2023-03-01 NOTE — Anesthesia Procedure Notes (Signed)
Procedure Name: Intubation Date/Time: 03/01/2023 7:39 AM  Performed by: Theodosia Quay, CRNAPre-anesthesia Checklist: Patient identified, Emergency Drugs available, Suction available, Patient being monitored and Timeout performed Patient Re-evaluated:Patient Re-evaluated prior to induction Oxygen Delivery Method: Circle system utilized Preoxygenation: Pre-oxygenation with 100% oxygen Induction Type: IV induction Ventilation: Mask ventilation without difficulty Laryngoscope Size: Mac and 3 Grade View: Grade II Tube type: Oral Tube size: 7.0 mm Number of attempts: 1 Airway Equipment and Method: Stylet Placement Confirmation: ETT inserted through vocal cords under direct vision, positive ETCO2, CO2 detector and breath sounds checked- equal and bilateral Secured at: 22 cm Tube secured with: Tape Dental Injury: Teeth and Oropharynx as per pre-operative assessment  Comments: ATOI

## 2023-03-02 ENCOUNTER — Encounter (HOSPITAL_COMMUNITY): Payer: Self-pay | Admitting: Surgery

## 2023-03-02 DIAGNOSIS — E0520 Thyrotoxicosis with toxic multinodular goiter without thyrotoxic crisis or storm: Secondary | ICD-10-CM | POA: Diagnosis not present

## 2023-03-02 LAB — GLUCOSE, CAPILLARY
Glucose-Capillary: 303 mg/dL — ABNORMAL HIGH (ref 70–99)
Glucose-Capillary: 323 mg/dL — ABNORMAL HIGH (ref 70–99)

## 2023-03-02 LAB — CALCIUM: Calcium: 8.8 mg/dL — ABNORMAL LOW (ref 8.9–10.3)

## 2023-03-02 MED ORDER — ALUM & MAG HYDROXIDE-SIMETH 200-200-20 MG/5ML PO SUSP
30.0000 mL | Freq: Four times a day (QID) | ORAL | Status: DC | PRN
  Administered 2023-03-02: 30 mL via ORAL
  Filled 2023-03-02: qty 30

## 2023-03-02 MED ORDER — TRAMADOL HCL 50 MG PO TABS: 50.0000 mg | ORAL_TABLET | Freq: Four times a day (QID) | ORAL | 0 refills | Status: DC | PRN

## 2023-03-02 MED ORDER — OXYCODONE HCL 5 MG PO TABS: 5.0000 mg | ORAL_TABLET | Freq: Four times a day (QID) | ORAL | 0 refills | Status: DC | PRN

## 2023-03-02 NOTE — Progress Notes (Signed)
1 Day Post-Op   Subjective/Chief Complaint: Complains of pain overnight but seems better this am   Objective: Vital signs in last 24 hours: Temp:  [97.4 F (36.3 C)-98.3 F (36.8 C)] 98.3 F (36.8 C) (04/20 0504) Pulse Rate:  [65-83] 67 (04/20 0504) Resp:  [13-20] 18 (04/20 0504) BP: (122-147)/(59-86) 130/74 (04/20 0504) SpO2:  [92 %-100 %] 96 % (04/20 0504)    Intake/Output from previous day: 04/19 0701 - 04/20 0700 In: 1956.9 [P.O.:1010; I.V.:946.9] Out: 1925 [Urine:1900; Blood:25] Intake/Output this shift: Total I/O In: 360 [P.O.:360] Out: -   General appearance: alert and cooperative Neck: incision looks good. Voice strong Resp: clear to auscultation bilaterally Cardio: regular rate and rhythm GI: soft, non-tender; bowel sounds normal; no masses,  no organomegaly  Lab Results:  No results for input(s): "WBC", "HGB", "HCT", "PLT" in the last 72 hours. BMET No results for input(s): "NA", "K", "CL", "CO2", "GLUCOSE", "BUN", "CREATININE", "CALCIUM" in the last 72 hours. PT/INR No results for input(s): "LABPROT", "INR" in the last 72 hours. ABG No results for input(s): "PHART", "HCO3" in the last 72 hours.  Invalid input(s): "PCO2", "PO2"  Studies/Results: No results found.  Anti-infectives: Anti-infectives (From admission, onward)    Start     Dose/Rate Route Frequency Ordered Stop   03/01/23 0600  ceFAZolin (ANCEF) IVPB 2g/100 mL premix        2 g 200 mL/hr over 30 Minutes Intravenous On call to O.R. 03/01/23 0540 03/01/23 0728       Assessment/Plan: s/p Procedure(s): TOTAL THYROIDECTOMY (N/A) Advance diet Check calcium. If ok then plan for d/c  LOS: 0 days    Chevis Pretty III 03/02/2023

## 2023-03-02 NOTE — Progress Notes (Signed)
Nurse reviewed discharge instructions with pt. Pt verbalized understanding of discharge instructions, follow up appointments and new medications.  No concerns at time of discharge.  Pt's insulin returned to her from pharmacy prior to discharge.

## 2023-03-03 ENCOUNTER — Emergency Department (HOSPITAL_COMMUNITY)
Admission: EM | Admit: 2023-03-03 | Discharge: 2023-03-03 | Disposition: A | Discharge status: Home / Self Care | Payer: Medicaid Other | Attending: Emergency Medicine | Admitting: Emergency Medicine

## 2023-03-03 ENCOUNTER — Other Ambulatory Visit: Payer: Self-pay

## 2023-03-03 DIAGNOSIS — Z9089 Acquired absence of other organs: Secondary | ICD-10-CM | POA: Insufficient documentation

## 2023-03-03 DIAGNOSIS — Z9889 Other specified postprocedural states: Secondary | ICD-10-CM

## 2023-03-03 DIAGNOSIS — Z794 Long term (current) use of insulin: Secondary | ICD-10-CM | POA: Diagnosis not present

## 2023-03-03 DIAGNOSIS — E119 Type 2 diabetes mellitus without complications: Secondary | ICD-10-CM | POA: Diagnosis not present

## 2023-03-03 DIAGNOSIS — E039 Hypothyroidism, unspecified: Secondary | ICD-10-CM | POA: Diagnosis not present

## 2023-03-03 DIAGNOSIS — Z85820 Personal history of malignant melanoma of skin: Secondary | ICD-10-CM | POA: Insufficient documentation

## 2023-03-03 DIAGNOSIS — R221 Localized swelling, mass and lump, neck: Secondary | ICD-10-CM | POA: Diagnosis present

## 2023-03-03 NOTE — ED Provider Notes (Signed)
[place] EMERGENCY DEPARTMENT AT [facility] Provider Note   CSN: [id number] Arrival date & time: 03/03/23  4098     History  Chief Complaint  Patient presents with   Post-op Problem    Mary Erickson is a 57 y.o. female.  HPI Patient presents with swelling on her upper chest and neck.  Had recent thyroidectomy 2 days ago.  States now more swelling.  No difficulty speaking.  States her voice does feel little deeper than before but not having any difficulty.   Past Medical History:  Diagnosis Date   Acid reflux    ADHD (attention deficit hyperactivity disorder)    Anemia    Arthritis    "elbows, hands, neck, shoulders" (10/02/2016)   Calcium blood increased    Chronic neck pain    Chronic UTI (urinary tract infection)    "from self caths" (10/02/2016)   Hashimoto's disease    Hepatitis B    acute hepatitis B from lancet   History of alcoholism    10/02/2016 "sober since 03/14/2005"   History of blood transfusion 1984; 1989; 2008   "quite a few"   History of shortness of breath    Hypercholesteremia    Hypothyroidism    no meds   Internal carotid aneurysm dx'd early 2017   Legally blind in left eye, as defined in [place]    Melanoma    left upper arm   Migraines    "quite a few in a month; at least 15" (10/02/2016)   Neuropathy    neck; "not sure if it's from DM or Sjogren's" (10/02/2016)   Pneumonia 1986   "1 yr after major OR when I was under anesthesia for 13 hr"   Seizures ~ 2004; ~ 2013   "seizures from diabetes "   Self-catheterizes urinary bladder    "since OR in 1984" (10/02/2016)   Sjogren's syndrome    Thyroid goiter    "still present" (10/02/2016)   Type II diabetes mellitus    Urinary retention    self caths   Wears glasses     Home Medications Prior to Admission medications   Medication Sig Start Date End Date Taking? Authorizing Provider  acetaminophen (TYLENOL) 500 MG tablet Take 1,000 mg by mouth every 6 (six) hours as  needed for moderate pain.    [provider]  benzocaine (ORAJEL) 10 % mucosal gel Use as directed 1 Application in the mouth or throat as needed for mouth pain.    [provider]  calcium carbonate (TUMS) 500 MG chewable tablet Chew 2 tablets (400 mg of elemental calcium total) by mouth 2 (two) times daily. 03/01/23   Darnell Level, MD  Cholecalciferol (VITAMIN D3) 125 MCG (5000 UT) capsule Take 5,000 Units by mouth daily.    [provider]  Cyanocobalamin (B-12) 3000 MCG CAPS Take 3,000 mcg by mouth daily.    [provider]  diclofenac Sodium (VOLTAREN) 1 % GEL Apply 2 g topically 4 (four) times daily. Patient taking differently: Apply 2 g topically 4 (four) times daily as needed (pain). 12/17/19   Darr, Gerilyn Pilgrim, PA-C  estrogens, conjugated, (PREMARIN) 0.45 MG tablet Take 0.45 mg by mouth daily.    [provider]  famotidine (PEPCID) 20 MG tablet Take 20 mg by mouth 2 (two) times daily.    [provider]  fluconazole (DIFLUCAN) 150 MG tablet Take 150 mg by mouth See admin instructions. Take 150 mg every 3 days for 3 doses starting  on the 15th of every month    [provider]  Insulin Detemir (LEVEMIR FLEXTOUCH) 100 UNIT/ML Pen Inject 4 Units into the skin 2 (two) times daily. 09/27/17   [provider]  Insulin lispro (HUMALOG JUNIOR KWIKPEN) 100 UNIT/ML Inject 1-7 Units into the skin as needed (high blood sugar).    [provider]  Ketotifen Fumarate ([employer] ALLERGY EYE DROPS OP) Place 1 drop into both eyes 2 (two) times daily.    [provider]  levothyroxine (SYNTHROID) 88 MCG tablet Take 1 tablet (88 mcg total) by mouth daily before breakfast. 03/01/23   Darnell Level, MD  Magnesium 400 MG CAPS Take 400 mg by mouth daily.    [provider]  Melatonin 10-10 MG TBCR Take 10 mg by mouth at bedtime.    [provider]  methylphenidate 18 MG PO CR tablet Take 18 mg by mouth 2 (two) times daily.     [provider]  Multiple Vitamins-Minerals (MULTIVITAMINS THER. W/MINERALS) TABS Take 1 tablet by mouth daily.    [provider]  omeprazole (PRILOSEC) 40 MG capsule Take 40 mg by mouth at bedtime. 05/04/19   [provider]  ondansetron (ZOFRAN ODT) 4 MG disintegrating tablet Take 1 tablet (4 mg total) by mouth every 8 (eight) hours as needed for nausea or vomiting. 12/07/19   Pricilla Loveless, MD  oxyCODONE (OXY IR/ROXICODONE) 5 MG immediate release tablet Take 1 tablet (5 mg total) by mouth every 6 (six) hours as needed for moderate pain. 03/02/23   Griselda Miner, MD  Polyethyl Glycol-Propyl Glycol (SYSTANE OP) Place 1 drop into both eyes 3 (three) times daily as needed (dry eyes).    [provider]  polyethylene glycol powder (GLYCOLAX/MIRALAX) 17 GM/SCOOP powder Take 17 g by mouth daily.    [provider]  predniSONE (DELTASONE) 5 MG tablet Take 5 mg by mouth 2 (two) times daily.    [provider]  traMADol (ULTRAM) 50 MG tablet Take 1 tablet (50 mg total) by mouth every 6 (six) hours as needed. 03/02/23 03/01/24  Chevis Pretty III, MD  zinc gluconate 50 MG tablet Take 50 mg by mouth daily.    [provider]      Allergies    Doxycycline, Aspirin, Ciprofloxacin, Nsaids, Amoxicillin-pot clavulanate, Gluten meal, Lactose, Codeine, Hydrocodone-acetaminophen, Oxycodone, Sulfa antibiotics, and Sulfasalazine    Review of Systems   Review of Systems  Physical Exam Updated Vital Signs BP 109/62   Pulse (!) 57   Temp 98.1 F (36.7 C) (Oral)   Resp 18   SpO2 96%  Physical Exam Vitals and nursing note reviewed.  Neck:     Comments: Surgical site clean dry and intact.  Mild ecchymosis superior to it.  Some swelling inferiorly and onto upper chest.  No pulsatile mass.  No subcu emphysema. Cardiovascular:     Rate and Rhythm: Normal rate.  Neurological:     Mental Status: She is alert.     ED Results / Procedures / Treatments    Labs (all labs ordered are listed, but only abnormal results are displayed) Labs Reviewed - No data to display  EKG None  Radiology No results found.  Procedures Procedures    Medications Ordered in ED Medications - No data to display  ED Course/ Medical Decision Making/ A&P  Medical Decision Making  Patient postoperative swelling.  Overall well-appearing.  Doubt air leak.  Doubt large hematoma.  Will discuss with general surgery.  Seen by Dr. Derrell Lolling.  Doubt severe swelling at this time.  Discussed with patient.  Discharge home with outpatient follow-up.        Final Clinical Impression(s) / ED Diagnoses Final diagnoses:  Post-operative state    Rx / DC Orders ED Discharge Orders     None         Benjiman Core, MD 03/03/23 1511

## 2023-03-03 NOTE — ED Triage Notes (Addendum)
C/o swelling to neck after thyroidectomy on Friday.  Denies sob or blood thinner usage Denies body aches, fever, chills.  Ambulatory to triage, talking in full sentences without distress

## 2023-03-03 NOTE — Discharge Instructions (Signed)
Watch for more swelling.  Watch for difficulty breathing or swallowing.  Follow-up with the surgeon as planned.

## 2023-03-04 LAB — GLUCOSE, CAPILLARY: Glucose-Capillary: 273 mg/dL — ABNORMAL HIGH (ref 70–99)

## 2023-03-07 ENCOUNTER — Encounter (HOSPITAL_COMMUNITY): Payer: Self-pay

## 2023-03-07 ENCOUNTER — Emergency Department (HOSPITAL_COMMUNITY)
Admission: EM | Admit: 2023-03-07 | Discharge: 2023-03-07 | Disposition: A | Discharge status: Home / Self Care | Payer: Medicaid Other | Attending: Emergency Medicine | Admitting: Emergency Medicine

## 2023-03-07 ENCOUNTER — Other Ambulatory Visit: Payer: Self-pay

## 2023-03-07 ENCOUNTER — Emergency Department (HOSPITAL_COMMUNITY): Payer: Medicaid Other

## 2023-03-07 DIAGNOSIS — E039 Hypothyroidism, unspecified: Secondary | ICD-10-CM | POA: Insufficient documentation

## 2023-03-07 DIAGNOSIS — R1030 Lower abdominal pain, unspecified: Secondary | ICD-10-CM | POA: Diagnosis not present

## 2023-03-07 DIAGNOSIS — E119 Type 2 diabetes mellitus without complications: Secondary | ICD-10-CM | POA: Insufficient documentation

## 2023-03-07 DIAGNOSIS — R110 Nausea: Secondary | ICD-10-CM

## 2023-03-07 DIAGNOSIS — K2960 Other gastritis without bleeding: Secondary | ICD-10-CM | POA: Insufficient documentation

## 2023-03-07 DIAGNOSIS — K5900 Constipation, unspecified: Secondary | ICD-10-CM | POA: Diagnosis not present

## 2023-03-07 LAB — COMPREHENSIVE METABOLIC PANEL
ALT: 29 U/L (ref 0–44)
AST: 30 U/L (ref 15–41)
Albumin: 4.3 g/dL (ref 3.5–5.0)
Alkaline Phosphatase: 80 U/L (ref 38–126)
Anion gap: 8 (ref 5–15)
BUN: 10 mg/dL (ref 6–20)
CO2: 29 mmol/L (ref 22–32)
Calcium: 9.8 mg/dL (ref 8.9–10.3)
Chloride: 99 mmol/L (ref 98–111)
Creatinine, Ser: 0.76 mg/dL (ref 0.44–1.00)
GFR, Estimated: >60 mL/min (ref >60)
Glucose, Bld: 140 mg/dL — ABNORMAL HIGH (ref 70–99)
Potassium: 3.9 mmol/L (ref 3.5–5.1)
Sodium: 136 mmol/L (ref 135–145)
Total Bilirubin: 0.8 mg/dL (ref 0.3–1.2)
Total Protein: 7.9 g/dL (ref 6.5–8.1)

## 2023-03-07 LAB — CBC
HCT: 40.6 % (ref 36.0–46.0)
Hemoglobin: 13.4 g/dL (ref 12.0–15.0)
MCH: 31.2 pg (ref 26.0–34.0)
MCHC: 33 g/dL (ref 30.0–36.0)
MCV: 94.4 fL (ref 80.0–100.0)
Platelets: 292 10*3/uL (ref 150–400)
RBC: 4.3 MIL/uL (ref 3.87–5.11)
RDW: 13 % (ref 11.5–15.5)
WBC: 10.9 10*3/uL — ABNORMAL HIGH (ref 4.0–10.5)
nRBC: 0 % (ref 0.0–0.2)

## 2023-03-07 LAB — SURGICAL PATHOLOGY

## 2023-03-07 LAB — LIPASE, BLOOD: Lipase: 25 U/L (ref 11–51)

## 2023-03-07 LAB — URINALYSIS, ROUTINE W REFLEX MICROSCOPIC
Bacteria, UA: NONE SEEN
Bilirubin Urine: NEGATIVE
Glucose, UA: NEGATIVE mg/dL
Hgb urine dipstick: NEGATIVE
Ketones, ur: NEGATIVE mg/dL
Nitrite: NEGATIVE
Protein, ur: NEGATIVE mg/dL
Specific Gravity, Urine: 1.003 — ABNORMAL LOW (ref 1.005–1.030)
pH: 8 (ref 5.0–8.0)

## 2023-03-07 MED ORDER — ONDANSETRON HCL 4 MG/2ML IJ SOLN
4.0000 mg | Freq: Once | INTRAMUSCULAR | Status: AC
  Administered 2023-03-07: 4 mg via INTRAVENOUS
  Filled 2023-03-07: qty 2

## 2023-03-07 MED ORDER — LIDOCAINE VISCOUS HCL 2 % MT SOLN
15.0000 mL | Freq: Once | OROMUCOSAL | Status: AC
  Administered 2023-03-07: 15 mL via ORAL
  Filled 2023-03-07: qty 15

## 2023-03-07 MED ORDER — METOCLOPRAMIDE HCL 10 MG PO TABS: 10.0000 mg | ORAL_TABLET | Freq: Four times a day (QID) | ORAL | 0 refills | Status: DC

## 2023-03-07 MED ORDER — ALUM & MAG HYDROXIDE-SIMETH 200-200-20 MG/5ML PO SUSP
30.0000 mL | Freq: Once | ORAL | Status: AC
  Administered 2023-03-07: 30 mL via ORAL
  Filled 2023-03-07: qty 30

## 2023-03-07 MED ORDER — IOHEXOL 300 MG/ML  SOLN
100.0000 mL | Freq: Once | INTRAMUSCULAR | Status: AC | PRN
  Administered 2023-03-07: 100 mL via INTRAVENOUS

## 2023-03-07 MED ORDER — ACETAMINOPHEN 500 MG PO TABS
1000.0000 mg | ORAL_TABLET | Freq: Once | ORAL | Status: AC
  Administered 2023-03-07: 1000 mg via ORAL
  Filled 2023-03-07: qty 2

## 2023-03-07 NOTE — Discharge Instructions (Addendum)
Thank you for coming to [facility]. You were seen for abdominal pain, reflux. We did an exam, labs, and imaging, and these showed chronic constipation.   For your constipation, please take 1-2 capfuls of miralax per day as well as colace twice per day daily for 30 days. If you begin having watery diarrhea, please stop taking the bowel regimen. However, your scan demonstrated chronic constipation which could be contributing to your feelings of bloating/indigestion/reflux. We will switch your nausea medicine to reglan, as zofran can contribute to constipation. You can take reglan 10 mg every 6 hours as needed for nausea/vomiting. Please follow up with your gastroenterologist at their soonest appointment.   Do not hesitate to return to the ED or call 911 if you experience: -Worsening symptoms -Lightheadedness, passing out -Fevers/chills -Anything else that concerns you

## 2023-03-07 NOTE — ED Triage Notes (Signed)
Patient had her thyroid removed Friday. Has been feeling nauseous and acid tasting in her mouth. Also has abdominal pain around her belly button.

## 2023-03-07 NOTE — Progress Notes (Signed)
Good news!  Pathology is benign.  Darnell Level, MD [facility] A [facility] practice Office: [phone]

## 2023-03-07 NOTE — ED Provider Notes (Signed)
[place] EMERGENCY DEPARTMENT AT [facility] Provider Note   CSN: [id number] Arrival date & time: 03/07/23  1435     History  Chief Complaint  Patient presents with   Nausea    Mary Erickson is a 57 y.o. female with Sjogren's syndrome, toxic multinodular thyroid with long history of both hypothyroidism/hyperthyroidism who recently had total thyroidectomy on 03/01/2023 presents with nausea and an acid taste in her mouth. She notes several months of worsening GERD and concern she may have an ulcer. Has been insufficiently managing at home with daily omeprazole, twice daily Pepcid, Maalox, and eventually putting baking soda and the things that she drinks.  She has had lower quadrant abdominal bloating and pain for several months that is worsening. A/w intermittent upper abdominal sharp pain that feels focal. Today she had a metallic taste in her mouth that tasted like blood and spit into the sink and it was pink.  She has not had any hematemesis, melena or hematochezia.  Of note, pathology of thyroid was benign. Has a h/o IBS, intermittent constipation/diarrhea, manages w/ miralax, today had diarrheal bowel movement this AM. Denies urinary or vaginal symptoms. Abdominal surgical history extensive including tumor removal from spine as a teenager, ovarian cyst/?tumor removal, ectopic pregnancy, hysterectomy. Patient requests an endoscopy if possible.  HPI     Home Medications Prior to Admission medications   Medication Sig Start Date End Date Taking? Authorizing Provider  metoCLOPramide (REGLAN) 10 MG tablet Take 1 tablet (10 mg total) by mouth every 6 (six) hours. 03/07/23  Yes Loetta Rough, MD  acetaminophen (TYLENOL) 500 MG tablet Take 1,000 mg by mouth every 6 (six) hours as needed for moderate pain.    [provider]  benzocaine (ORAJEL) 10 % mucosal gel Use as directed 1 Application in the mouth or throat as needed for mouth pain.    [provider]   calcium carbonate (TUMS) 500 MG chewable tablet Chew 2 tablets (400 mg of elemental calcium total) by mouth 2 (two) times daily. 03/01/23   Darnell Level, MD  Cholecalciferol (VITAMIN D3) 125 MCG (5000 UT) capsule Take 5,000 Units by mouth daily.    [provider]  Cyanocobalamin (B-12) 3000 MCG CAPS Take 3,000 mcg by mouth daily.    [provider]  diclofenac Sodium (VOLTAREN) 1 % GEL Apply 2 g topically 4 (four) times daily. Patient taking differently: Apply 2 g topically 4 (four) times daily as needed (pain). 12/17/19   Darr, Gerilyn Pilgrim, PA-C  estrogens, conjugated, (PREMARIN) 0.45 MG tablet Take 0.45 mg by mouth daily.    [provider]  famotidine (PEPCID) 20 MG tablet Take 20 mg by mouth 2 (two) times daily.    [provider]  fluconazole (DIFLUCAN) 150 MG tablet Take 150 mg by mouth See admin instructions. Take 150 mg every 3 days for 3 doses starting on the 15th of every month    [provider]  Insulin Detemir (LEVEMIR FLEXTOUCH) 100 UNIT/ML Pen Inject 4 Units into the skin 2 (two) times daily. 09/27/17   [provider]  Insulin lispro (HUMALOG JUNIOR KWIKPEN) 100 UNIT/ML Inject 1-7 Units into the skin as needed (high blood sugar).    [provider]  Ketotifen Fumarate ([employer] ALLERGY EYE DROPS OP) Place 1 drop into both eyes 2 (two) times daily.    [provider]  levothyroxine (SYNTHROID) 88 MCG tablet Take 1 tablet (88 mcg total) by mouth daily before breakfast. 03/01/23   Gerkin,  Tawanna Cooler, MD  Magnesium 400 MG CAPS Take 400 mg by mouth daily.    [provider]  Melatonin 10-10 MG TBCR Take 10 mg by mouth at bedtime.    [provider]  methylphenidate 18 MG PO CR tablet Take 18 mg by mouth 2 (two) times daily.    [provider]  Multiple Vitamins-Minerals (MULTIVITAMINS THER. W/MINERALS) TABS Take 1 tablet by mouth daily.    [provider]  omeprazole (PRILOSEC) 40 MG capsule Take 40  mg by mouth at bedtime. 05/04/19   [provider]  ondansetron (ZOFRAN ODT) 4 MG disintegrating tablet Take 1 tablet (4 mg total) by mouth every 8 (eight) hours as needed for nausea or vomiting. 12/07/19   Pricilla Loveless, MD  oxyCODONE (OXY IR/ROXICODONE) 5 MG immediate release tablet Take 1 tablet (5 mg total) by mouth every 6 (six) hours as needed for moderate pain. 03/02/23   Griselda Miner, MD  Polyethyl Glycol-Propyl Glycol (SYSTANE OP) Place 1 drop into both eyes 3 (three) times daily as needed (dry eyes).    [provider]  polyethylene glycol powder (GLYCOLAX/MIRALAX) 17 GM/SCOOP powder Take 17 g by mouth daily.    [provider]  predniSONE (DELTASONE) 5 MG tablet Take 5 mg by mouth 2 (two) times daily.    [provider]  traMADol (ULTRAM) 50 MG tablet Take 1 tablet (50 mg total) by mouth every 6 (six) hours as needed. 03/02/23 03/01/24  Chevis Pretty III, MD  zinc gluconate 50 MG tablet Take 50 mg by mouth daily.    [provider]      Allergies    Doxycycline, Aspirin, Ciprofloxacin, Nsaids, Amoxicillin-pot clavulanate, Gluten meal, Lactose, Codeine, Hydrocodone-acetaminophen, Oxycodone, Sulfa antibiotics, and Sulfasalazine    Review of Systems   Review of Systems Review of systems Negative for f/c.  A 10 point review of systems was performed and is negative unless otherwise reported in HPI.  Physical Exam Updated Vital Signs BP (!) 164/83   Pulse 66   Temp 98.2 F (36.8 C) (Oral)   Resp 13   Ht 5\' 8"  (1.727 m)   Wt 61 kg   SpO2 100%   BMI 20.45 kg/m  Physical Exam General: Normal appearing female, lying in bed.  HEENT: PERRLA, Sclera anicteric, MMM, trachea midline.  Cardiology: RRR, no murmurs/rubs/gallops. BL radial and DP pulses equal bilaterally.  Resp: Normal respiratory rate and effort. CTAB, no wheezes, rhonchi, crackles.  Abd: Mildly distended in lower quadrants with mild TTP. Soft. No rebound tenderness or guarding.   GU: Deferred. MSK: No peripheral edema or signs of trauma. Extremities without deformity or TTP. No cyanosis or clubbing. Skin: warm, dry.  Back: No CVA tenderness Neuro: A&Ox4, CNs II-XII grossly intact. MAEs. Sensation grossly intact.  Psych: Normal mood and affect.   ED Results / Procedures / Treatments   Labs (all labs ordered are listed, but only abnormal results are displayed) Labs Reviewed  COMPREHENSIVE METABOLIC PANEL - Abnormal; Notable for the following components:      Result Value   Glucose, Bld 140 (*)    All other components within normal limits  CBC - Abnormal; Notable for the following components:   WBC 10.9 (*)    All other components within normal limits  URINALYSIS, ROUTINE W REFLEX MICROSCOPIC - Abnormal; Notable for the following components:   Color, Urine COLORLESS (*)    Specific Gravity, Urine 1.003 (*)    Leukocytes,Ua TRACE (*)    All  other components within normal limits  LIPASE, BLOOD    EKG EKG Interpretation  Date/Time:  Thursday March 07 2023 17:17:53 EDT Ventricular Rate:  61 PR Interval:  157 QRS Duration: 88 QT Interval:  396 QTC Calculation: 399 R Axis:   65 Text Interpretation: Sinus rhythm Similar to prior Confirmed by Vivi Barrack [phone]) on 03/07/2023 5:55:57 PM  Radiology CT ABDOMEN PELVIS W CONTRAST  Result Date: 03/07/2023 CLINICAL DATA:  Recent thyroidectomy, nausea, periumbilical abdominal pain EXAM: CT ABDOMEN AND PELVIS WITH CONTRAST TECHNIQUE: Multidetector CT imaging of the abdomen and pelvis was performed using the standard protocol following bolus administration of intravenous contrast. RADIATION DOSE REDUCTION: This exam was performed according to the departmental dose-optimization program which includes automated exposure control, adjustment of the mA and/or kV according to patient size and/or use of iterative reconstruction technique. CONTRAST:  OMNIPAQUE IOHEXOL 300 MG/ML  SOLN COMPARISON:  12/10/2019 FINDINGS:  Lower chest: No acute pleural or parenchymal lung disease. Hepatobiliary: Stable hemangioma within the right lobe liver measuring up to 3.9 cm. No other focal liver abnormalities. Cholecystectomy. Pancreas: Unremarkable. No pancreatic ductal dilatation or surrounding inflammatory changes. Spleen: Normal in size without focal abnormality. Adrenals/Urinary Tract: Adrenal glands are unremarkable. Kidneys are normal, without renal calculi, focal lesion, or hydronephrosis. Bladder is unremarkable. Stomach/Bowel: No bowel obstruction or ileus. There is a large amount of retained stool throughout the colon consistent with constipation. No bowel wall thickening or inflammatory change. Vascular/Lymphatic: Aortic atherosclerosis. No enlarged abdominal or pelvic lymph nodes. Reproductive: Status post hysterectomy. No adnexal masses. Other: No free fluid or free intraperitoneal gas. No abdominal wall hernia. Musculoskeletal: Stable postsurgical changes from resection of the distal sacrum and coccyx. No acute or destructive bony lesions. Reconstructed images demonstrate no additional findings. IMPRESSION: 1. Large amount of retained stool again seen throughout the colon, consistent with chronic constipation. No bowel obstruction or ileus. 2. No acute intra-abdominal or intrapelvic process. 3.  Aortic Atherosclerosis (ICD10-I70.0). Electronically Signed   By: Sharlet Salina M.D.   On: 03/07/2023 19:01    Procedures Procedures    Medications Ordered in ED Medications  ondansetron (ZOFRAN) injection 4 mg (4 mg Intravenous Given 03/07/23 1733)  iohexol (OMNIPAQUE) 300 MG/ML solution 100 mL (100 mLs Intravenous Contrast Given 03/07/23 1832)  acetaminophen (TYLENOL) tablet 1,000 mg (1,000 mg Oral Given 03/07/23 1924)  alum & mag hydroxide-simeth (MAALOX/MYLANTA) 200-200-20 MG/5ML suspension 30 mL (30 mLs Oral Given 03/07/23 1925)    And  lidocaine (XYLOCAINE) 2 % viscous mouth solution 15 mL (15 mLs Oral Given 03/07/23 1925)     ED Course/ Medical Decision Making/ A&P                          Medical Decision Making Amount and/or Complexity of Data Reviewed Labs: ordered. Decision-making details documented in ED Course. Radiology: ordered. Decision-making details documented in ED Course.  Risk OTC drugs. Prescription drug management.    This patient presents to the ED for concern of abdominal pain, reflux, bloating; this involves an extensive number of treatment options, and is a complaint that carries with it a high risk of complications and morbidity.  I considered the following differential and admission for this acute, potentially life threatening condition. Patient is overall HDS and well-appearing.  MDM:    For DDX for abdominal pain includes but is not limited to:  Abdominal exam without peritoneal signs. No evidence of acute abdomen at this time.   Consider GERD/gastritis/PUD.  Patient does not have any signs of acute abdomen to indicate perforated viscus at this time.  Additionally her symptoms seem to be more subacute versus chronic in nature.  Consider ileus, partial bowel obstruction given patient's lower abdominal bloating that seems to be worsening as well as constipation and diarrhea today.  Consider pancreatitis though no epigastric tenderness palpation at this time.  Consider vascular catastrophe or other intra-abdominal infection such as hepatobiliary disease like cholecystitis or acute appendicitis.  Patient does have history of Sjogren syndrome, could be having slow transit/gastroparesis, patient relates that she is taking a lot of Zofran which could contribute to constipation.  Will obtain CT abdomen pelvis to rule out surgical or intra-abdominal infection and then treat patient symptomatically.  Patient currently does have a follow-up appointment with GI in July but would like to have a sooner appointment for endoscopy.   Clinical Course as of 03/07/23 2125  Thu Mar 07, 2023  1820  Urinalysis, Routine w reflex microscopic -Urine, Clean Catch(!) No UTI [HN]  1820 Hemoglobin: 13.4 [HN]  1820 Lipase: 25 [HN]  1820 Comprehensive metabolic panel(!) Largely unremarkable in context of presentation [HN]  1905 CT ABDOMEN PELVIS W CONTRAST 1. Large amount of retained stool again seen throughout the colon, consistent with chronic constipation. No bowel obstruction or ileus. 2. No acute intra-abdominal or intrapelvic process. 3.  Aortic Atherosclerosis (ICD10-I70.0).   [HN]    Clinical Course User Index [HN] Loetta Rough, MD    Labs: I Ordered, and personally interpreted labs.  The pertinent results include:  those listed above  Imaging Studies ordered: I ordered imaging studies including CT abd pelvis w contrast I independently visualized and interpreted imaging. I agree with the radiologist interpretation  Additional history obtained from chart review.    Cardiac Monitoring: The patient was maintained on a cardiac monitor.  I personally viewed and interpreted the cardiac monitored which showed an underlying rhythm of: NSR  Reevaluation: After the interventions noted above, I reevaluated the patient and found that they have :improved  Social Determinants of Health: Patient lives independently   Disposition:  Patient's symptoms improved after GI cocktail and tylenol. Discussed with her the change from PRN zofran to PRN reglan and daily bowel regimen to improve constipation, which is likely contributing to her bloating/indigestion/reflux. Instructed to continue pepcid/omeprazole. Patient is Instructed to call GI and try to get on cancellation list to find sooner appointment for her symptoms. Given DC instructions/return precautions, all questions answered to patient's satisfaction.  Co morbidities that complicate the patient evaluation  Past Medical History:  Diagnosis Date   Acid reflux    ADHD (attention deficit hyperactivity disorder)    Anemia     Arthritis    "elbows, hands, neck, shoulders" (10/02/2016)   Calcium blood increased    Chronic neck pain    Chronic UTI (urinary tract infection)    "from self caths" (10/02/2016)   Hashimoto's disease    Hepatitis B    acute hepatitis B from lancet   History of alcoholism (HCC)    10/02/2016 "sober since 03/14/2005"   History of blood transfusion 1984; 1989; 2008   "quite a few"   History of shortness of breath    Hypercholesteremia    Hypothyroidism    no meds   Internal carotid aneurysm dx'd early 2017   Legally blind in left eye, as defined in [place]    Melanoma [facility])    left upper arm   Migraines    "quite a  few in a month; at least 15" (10/02/2016)   Neuropathy    neck; "not sure if it's from DM or Sjogren's" (10/02/2016)   Pneumonia 1986   "1 yr after major OR when I was under anesthesia for 13 hr"   Seizures (HCC) ~ 2004; ~ 2013   "seizures from diabetes "   Self-catheterizes urinary bladder    "since OR in 1984" (10/02/2016)   Sjogren's syndrome (HCC)    Thyroid goiter    "still present" (10/02/2016)   Type II diabetes mellitus (HCC)    Urinary retention    self caths   Wears glasses      Medicines Meds ordered this encounter  Medications   ondansetron (ZOFRAN) injection 4 mg   iohexol (OMNIPAQUE) 300 MG/ML solution 100 mL   acetaminophen (TYLENOL) tablet 1,000 mg   AND Linked Order Group    alum & mag hydroxide-simeth (MAALOX/MYLANTA) 200-200-20 MG/5ML suspension 30 mL    lidocaine (XYLOCAINE) 2 % viscous mouth solution 15 mL   metoCLOPramide (REGLAN) 10 MG tablet    Sig: Take 1 tablet (10 mg total) by mouth every 6 (six) hours.    Dispense:  30 tablet    Refill:  0    I have reviewed the patients home medicines and have made adjustments as needed  Problem List / ED Course: Problem List Items Addressed This Visit   None Visit Diagnoses     Nausea    -  Primary   Lower abdominal pain       Constipation, unspecified constipation type       Reflux  gastritis                       This note was created using dictation software, which may contain spelling or grammatical errors.    Loetta Rough, MD 03/07/23 2129

## 2023-04-29 ENCOUNTER — Telehealth: Payer: Self-pay | Admitting: Internal Medicine

## 2023-04-29 NOTE — Telephone Encounter (Signed)
PT understands that she has not been seen yet but she has some questions pertaining to SIBO and would just like to speak to a nurse to try and provide some insight until her appointment in July. Please advise.

## 2023-05-20 ENCOUNTER — Ambulatory Visit (INDEPENDENT_AMBULATORY_CARE_PROVIDER_SITE_OTHER): Payer: MEDICAID | Admitting: Internal Medicine

## 2023-05-20 ENCOUNTER — Encounter: Payer: Self-pay | Admitting: Internal Medicine

## 2023-05-20 VITALS — BP 100/64 | HR 62 | Ht 68.0 in | Wt 130.0 lb

## 2023-05-20 DIAGNOSIS — Z1211 Encounter for screening for malignant neoplasm of colon: Secondary | ICD-10-CM | POA: Diagnosis not present

## 2023-05-20 DIAGNOSIS — K219 Gastro-esophageal reflux disease without esophagitis: Secondary | ICD-10-CM

## 2023-05-20 DIAGNOSIS — K5900 Constipation, unspecified: Secondary | ICD-10-CM | POA: Diagnosis not present

## 2023-05-20 MED ORDER — LINACLOTIDE 145 MCG PO CAPS: 145.0000 ug | ORAL_CAPSULE | Freq: Every day | ORAL | 3 refills | Status: DC

## 2023-05-20 MED ORDER — NA SULFATE-K SULFATE-MG SULF 17.5-3.13-1.6 GM/177ML PO SOLN: ORAL | 0 refills | Status: DC

## 2023-05-20 NOTE — Progress Notes (Signed)
Chief Complaint: GERD  HPI : 57 year old female with history of Sjogren's syndrome, hypothyroidism, T1DM, seizures, migraines, and ADHD presents with GERD  She has had a lot of thyroid issues over the years, which has led to a lot of GI issues. She has had a poor appetite and GERD over the last 2 months. Her blood sugars have been low. She does feel like she has issues with dehydration. She wakes up a little nauseous in the morning and throughout the day. Denies vomiting. For her GERD, she is currently on omeprazole 40 mg every day. She has had constipation since she was a teenager. She had had colonoscopies in the past when she was not adequately cleaned out.  She has been told that she may be developing early gastroparesis due to her underlying diabetes. Last colonoscopy in 2014 that was done in the hospital was normal. She had a benign tumor removed via a large surgery when she was 17. She has had to self-cath since she was 57 years old as a result of this surgery. Denies family history of colon cancer. Patient has never had colon polyps. Denies blood in the stools. She was previously on Reglan but stopped this due to concern about long term side effects. She has put herself back on the Linzess 72 mcg every day, which does help with constipation. She takes Miralax PRN and enemas PRN. She has history of SIBO that caused a lot of bloating, which she has been treated for in the past. Weight has been relatively stable. Endorses ab pain on occasion in the lower abdomen.   Wt Readings from Last 3 Encounters:  05/20/23 130 lb (59 kg)  03/07/23 134 lb 7.7 oz (61 kg)  03/01/23 134 lb (60.8 kg)   Past Medical History:  Diagnosis Date   Acid reflux    ADHD (attention deficit hyperactivity disorder)    Anemia    Arthritis    "elbows, hands, neck, shoulders" (10/02/2016)   Calcium blood increased    Chronic neck pain    Chronic UTI (urinary tract infection)    "from self caths" (10/02/2016)    Hashimoto's disease    Hepatitis B    acute hepatitis B from lancet   History of alcoholism (HCC)    10/02/2016 "sober since 03/14/2005"   History of blood transfusion 1984; 1989; 2008   "quite a few"   History of shortness of breath    Hypercholesteremia    Hypothyroidism    no meds   Internal carotid aneurysm dx'd early 2017   Legally blind in left eye, as defined in [place]    Melanoma [facility])    left upper arm   Migraines    "quite a few in a month; at least 15" (10/02/2016)   Neuropathy    neck; "not sure if it's from DM or Sjogren's" (10/02/2016)   Pneumonia 1986   "1 yr after major OR when I was under anesthesia for 13 hr"   Seizures (HCC) ~ 2004; ~ 2013   "seizures from diabetes "   Self-catheterizes urinary bladder    "since OR in 1984" (10/02/2016)   Sjogren's syndrome (HCC)    Thyroid goiter    "still present" (10/02/2016)   Type II diabetes mellitus (HCC)    Urinary retention    self caths   Wears glasses      Past Surgical History:  Procedure Laterality Date   ABDOMINAL HYSTERECTOMY  2008   BILATERAL CARPAL TUNNEL RELEASE Bilateral  ECTOPIC PREGNANCY SURGERY  1989   EXCISION MELANOMA WITH SENTINEL LYMPH NODE BIOPSY Left 10/02/2016   Procedure: WIDE LOCAL EXCISION AND ADVANCEMENT FLAP CLOSURE LEFT UPPER  MELANOMA WITH SENTINEL LYMPH NODE MAPPING AND BIOPSY;  Surgeon: Almond Lint, MD;  Location: [facility] OR;  Service: General;  Laterality: Left;   EYE SURGERY     LAPAROSCOPIC CHOLECYSTECTOMY  ~ 01/2016   MELANOMA EXCISION WITH SENTINEL LYMPH NODE BIOPSY Left 10/02/2016   WIDE LOCAL EXCISION AND ADVANCEMENT FLAP CLOSURE LEFT UPPER  MELANOMA WITH SENTINEL LYMPH NODE MAPPING AND BIOPSY   OVARIAN CYST REMOVAL     SHOULDER ARTHROSCOPY W/ ROTATOR CUFF REPAIR Right 2014   "partial"   STRABISMUS SURGERY Left 1971   STRABISMUS SURGERY Left 03/18/2020   Procedure: STRABISMUS REPAIR;  Surgeon: Verne Carrow, MD;  Location: [facility];  Service: Ophthalmology;   Laterality: Left;   THYROIDECTOMY N/A 03/01/2023   Procedure: TOTAL THYROIDECTOMY;  Surgeon: Darnell Level, MD;  Location: [facility];  Service: General;  Laterality: N/A;   TUMOR EXCISION  1984   from tail bone and part of tail bone removed   Family History  Problem Relation Age of Onset   Multiple sclerosis Father    Colon cancer Neg Hx    Stomach cancer Neg Hx    Social History   Tobacco Use   Smoking status: Former    Packs/day: 1.50    Years: 24.00    Additional pack years: 0.00    Total pack years: 36.00    Types: Cigarettes    Quit date: 2008    Years since quitting: 16.5   Smokeless tobacco: Never  Vaping Use   Vaping Use: Never used  Substance Use Topics   Alcohol use: No    Comment: 10/02/2016 "sober since 03/14/2005"   Drug use: Not Currently    Types: Marijuana    Comment: 10/02/2016 " occasional; nothing since before 2006"   Current Outpatient Medications  Medication Sig Dispense Refill   acetaminophen (TYLENOL) 500 MG tablet Take 1,000 mg by mouth every 6 (six) hours as needed for moderate pain.     benzocaine (ORAJEL) 10 % mucosal gel Use as directed 1 Application in the mouth or throat as needed for mouth pain.     calcium carbonate (TUMS) 500 MG chewable tablet Chew 2 tablets (400 mg of elemental calcium total) by mouth 2 (two) times daily. 90 tablet 1   Cholecalciferol (VITAMIN D3) 125 MCG (5000 UT) capsule Take 5,000 Units by mouth daily.     Cyanocobalamin (B-12) 3000 MCG CAPS Take 3,000 mcg by mouth daily.     diclofenac Sodium (VOLTAREN) 1 % GEL Apply 2 g topically 4 (four) times daily. (Patient taking differently: Apply 2 g topically 4 (four) times daily as needed (pain).) 50 g 0   estrogens, conjugated, (PREMARIN) 0.45 MG tablet Take 0.45 mg by mouth daily.     famotidine (PEPCID) 20 MG tablet Take 20 mg by mouth 2 (two) times daily.     fluconazole (DIFLUCAN) 150 MG tablet Take 150 mg by mouth See admin instructions. Take 150 mg every 3 days for 3 doses  starting on the 15th of every month     Insulin Detemir (LEVEMIR FLEXTOUCH) 100 UNIT/ML Pen Inject 4 Units into the skin 2 (two) times daily.     Insulin lispro (HUMALOG JUNIOR KWIKPEN) 100 UNIT/ML Inject 1-7 Units into the skin as needed (high blood sugar).     Ketotifen Fumarate ([employer] ALLERGY EYE  DROPS OP) Place 1 drop into both eyes 2 (two) times daily.     levothyroxine (SYNTHROID) 88 MCG tablet Take 1 tablet (88 mcg total) by mouth daily before breakfast. 30 tablet 3   linaclotide (LINZESS) 72 MCG capsule Take by mouth.     Magnesium 400 MG CAPS Take 400 mg by mouth daily.     Melatonin 10-10 MG TBCR Take 10 mg by mouth at bedtime.     methylphenidate 18 MG PO CR tablet Take 18 mg by mouth 2 (two) times daily.     Multiple Vitamins-Minerals (MULTIVITAMINS THER. W/MINERALS) TABS Take 1 tablet by mouth daily.     omeprazole (PRILOSEC) 40 MG capsule Take 40 mg by mouth at bedtime.     ondansetron (ZOFRAN ODT) 4 MG disintegrating tablet Take 1 tablet (4 mg total) by mouth every 8 (eight) hours as needed for nausea or vomiting. 10 tablet 0   Polyethyl Glycol-Propyl Glycol (SYSTANE OP) Place 1 drop into both eyes 3 (three) times daily as needed (dry eyes).     polyethylene glycol powder (GLYCOLAX/MIRALAX) 17 GM/SCOOP powder Take 17 g by mouth daily.     predniSONE (DELTASONE) 5 MG tablet Take 5 mg by mouth 2 (two) times daily.     traMADol (ULTRAM) 50 MG tablet Take 1 tablet (50 mg total) by mouth every 6 (six) hours as needed. 20 tablet 0   zinc gluconate 50 MG tablet Take 50 mg by mouth daily.     No current facility-administered medications for this visit.   Allergies  Allergen Reactions   Doxycycline Other (See Comments)    Elevates liver enzymes    Aspirin Other (See Comments)    Can take 81 mg but has to be coated; No other Aspirin due to bleeding in stools from ulcers   Ciprofloxacin Hives    Per pt, she can tolerate oral but not IV   Nsaids Other (See Comments)    GI issues     Amoxicillin-Pot Clavulanate Nausea Only   Gluten Meal     Avoid due to autoimmune issues    Lactose     Avoid due to autoimmune issues    Codeine Nausea And Vomiting   Hydrocodone-Acetaminophen Nausea And Vomiting   Oxycodone Nausea And Vomiting   Sulfa Antibiotics Nausea And Vomiting   Sulfasalazine Nausea And Vomiting     Review of Systems: All systems reviewed and negative except where noted in HPI.   Physical Exam: BP 100/64   Pulse 62   Ht 5\' 8"  (1.727 m)   Wt 130 lb (59 kg)   BMI 19.77 kg/m  Constitutional: Pleasant,well-developed, female in no acute distress. HEENT: Normocephalic and atraumatic. Conjunctivae are normal. No scleral icterus. Cardiovascular: Normal rate, regular rhythm.  Pulmonary/chest: Effort normal and breath sounds normal. No wheezing, rales or rhonchi. Abdominal: Soft, nondistended, nontender. Bowel sounds quiet. There are no masses palpable. No hepatomegaly. Extremities: No edema Neurological: Alert and oriented to person place and time. Skin: Skin is warm and dry. No rashes noted. Psychiatric: Normal mood and affect. Behavior is normal.  Labs 02/2023: CBC with elevated WBC of 10.9. CMP unremarkable. Lipase nml.   CT A/P w/contrast 03/07/23: IMPRESSION: 1. Large amount of retained stool again seen throughout the colon, consistent with chronic constipation. No bowel obstruction or ileus. 2. No acute intra-abdominal or intrapelvic process. 3.  Aortic Atherosclerosis (ICD10-I70.0).  ASSESSMENT AND PLAN: Colon cancer screening Constipation GERD Patient presents with longstanding constipation for which she is currently on  low-dose Linzess and MiraLAX/enemas as needed.  Will increase her Linzess dose since her recent CT scan in 02/2023 showed a large amount of retained stool.  Patient is interested in another colonoscopy for colon cancer screening so we will try to optimize her constipation regimen beforehand and then have her do a 2-day prep to try to  ensure that she will have an adequate exam.  Will also plan for an EGD procedure due to recent exacerbation of her reflux symptoms. - Continue omeprazole 40 mg every day. She will use Pepcid at bedtime PRN. - Increase Linzess from 72 mcg to 145 mcg every day - Continue Miralax PRN and enemas PRN - Patient will call me/message me in 1 month to let me know how the constipation is responding to the Linzess therapy - EGD/colonoscopy [facility] in 2 months. Will do 2-day prep with Suprep/Miralax  Eulah Pont, MD  I spent 60 minutes of time, including in depth chart review, independent review of results as outlined above, communicating results with the patient directly, face-to-face time with the patient, coordinating care, ordering studies and medications as appropriate, and documentation.

## 2023-05-20 NOTE — Patient Instructions (Addendum)
You have been scheduled for an endoscopy and colonoscopy. Please follow the written instructions given to you at your visit today.  Please pick up your prep supplies at the pharmacy within the next 1-3 days.  If you use inhalers (even only as needed), please bring them with you on the day of your procedure.  DO NOT TAKE 7 DAYS PRIOR TO TEST- Trulicity (dulaglutide) Ozempic, Wegovy (semaglutide) Mounjaro (tirzepatide) Bydureon Bcise (exanatide extended release)  DO NOT TAKE 1 DAY PRIOR TO YOUR TEST Rybelsus (semaglutide) Adlyxin (lixisenatide) Victoza (liraglutide) Byetta (exanatide)   We have sent the following medications to your pharmacy for you to pick up at your convenience: Linzess, Suprep  _______________________________________________________  If your blood pressure at your visit was 140/90 or greater, please contact your primary care physician to follow up on this.  _______________________________________________________  If you are age 57 or older, your body mass index should be between 23-30. Your Body mass index is 19.77 kg/m. If this is out of the aforementioned range listed, please consider follow up with your Primary Care Provider.  If you are age 57 or younger, your body mass index should be between 19-25. Your Body mass index is 19.77 kg/m. If this is out of the aformentioned range listed, please consider follow up with your Primary Care Provider.   ________________________________________________________  The [facility] providers would like to encourage you to use [facility] to communicate with providers for non-urgent requests or questions.  Due to long hold times on the telephone, sending your provider a message by [facility] may be a faster and more efficient way to get a response.  Please allow 48 business hours for a response.  Please remember that this is for non-urgent requests.  _______________________________________________________   Due to recent changes  in healthcare laws, you may see the results of your imaging and laboratory studies on MyChart before your provider has had a chance to review them.  We understand that in some cases there may be results that are confusing or concerning to you. Not all laboratory results come back in the same time frame and the provider may be waiting for multiple results in order to interpret others.  Please give [place] 48 hours in order for your provider to thoroughly review all the results before contacting the office for clarification of your results.    Thank you for entrusting me with your care and for choosing [facility], Dr. Eulah Pont

## 2023-05-25 ENCOUNTER — Other Ambulatory Visit: Payer: Self-pay

## 2023-05-25 ENCOUNTER — Ambulatory Visit (HOSPITAL_COMMUNITY)
Admission: RE | Admit: 2023-05-25 | Discharge: 2023-05-25 | Disposition: A | Discharge status: Home / Self Care | Payer: MEDICAID | Source: Ambulatory Visit | Attending: Internal Medicine | Admitting: Internal Medicine

## 2023-05-25 ENCOUNTER — Encounter (HOSPITAL_COMMUNITY): Payer: Self-pay

## 2023-05-25 VITALS — BP 120/77 | HR 63 | Temp 98.4°F | Resp 18

## 2023-05-25 DIAGNOSIS — K638219 Small intestinal bacterial overgrowth, unspecified: Secondary | ICD-10-CM

## 2023-05-25 MED ORDER — RIFAXIMIN 550 MG PO TABS: 550.0000 mg | ORAL_TABLET | Freq: Three times a day (TID) | ORAL | 0 refills | Status: DC

## 2023-05-25 NOTE — Discharge Instructions (Addendum)
You were seen today for abdominal bloating, nausea and chronic constipation.  It seems like you are having a flare of your SIBO.  We are treating you with Xifaxan 500 mg 3 times daily for the next 14 days.  It may be helpful if you start a probiotic OTC.  You have Zofran to take as needed for nausea.  Continue omeprazole and famotidine as previously prescribed.  Please follow-up with your PCP or GI doctor symptoms persist.

## 2023-05-25 NOTE — ED Triage Notes (Signed)
Concerned for lower intestinal bacteria.  Recently had thyroid surgery.  Complains of bloated, rash on trunk and top of both legs and has nausea.

## 2023-05-25 NOTE — ED Provider Notes (Addendum)
[facility]    CSN: [id number] Arrival date & time: 05/25/23  1234      History   Chief Complaint Chief Complaint  Patient presents with   Appointment    12:30   Rash   Abdominal Pain    HPI Mary Erickson is a 57 y.o. female with a history of GERD, type 1 diabetes with gastroparesis, chronic constipation presents to urgent care today with complaint of nausea, reflux, severe abdominal bloating and rash of abdomen.  She noticed this 2 days ago.  She denies vomiting, diarrhea or blood in her stool.  She reports she was recently diagnosed with SIBO, treated with ciprofloxacin which she has been off for 2 weeks.  She is her symptoms improved but then worsened again 2 days ago.  She is taking famotidine and omeprazole as prescribed.  She reports she saw her GI doctor on Monday but was not having any recurrent symptoms at that time.  She reports he she is scheduled to have an upper endoscopy and colonoscopy in the near future.  HPI  Past Medical History:  Diagnosis Date   Acid reflux    ADHD (attention deficit hyperactivity disorder)    Anemia    Arthritis    "elbows, hands, neck, shoulders" (10/02/2016)   Calcium blood increased    Chronic neck pain    Chronic UTI (urinary tract infection)    "from self caths" (10/02/2016)   Hashimoto's disease    Hepatitis B    acute hepatitis B from lancet   History of alcoholism (HCC)    10/02/2016 "sober since 03/14/2005"   History of blood transfusion 1984; 1989; 2008   "quite a few"   History of shortness of breath    Hypercholesteremia    Hypothyroidism    no meds   Internal carotid aneurysm dx'd early 2017   Legally blind in left eye, as defined in [place]    Melanoma [facility])    left upper arm   Migraines    "quite a few in a month; at least 15" (10/02/2016)   Neuropathy    neck; "not sure if it's from DM or Sjogren's" (10/02/2016)   Pneumonia 1986   "1 yr after major OR when I was under anesthesia for 13 hr"    Seizures (HCC) ~ 2004; ~ 2013   "seizures from diabetes "   Self-catheterizes urinary bladder    "since OR in 1984" (10/02/2016)   Sjogren's syndrome (HCC)    Thyroid goiter    "still present" (10/02/2016)   Type II diabetes mellitus (HCC)    Urinary retention    self caths   Wears glasses     Patient Active Problem List   Diagnosis Date Noted   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   Goiter, toxic, multinodular 02/24/2023   Hyponatremia 04/17/2021   IBS (irritable bowel syndrome) 04/17/2021   Dizziness 12/11/2019   Colitis 12/11/2019   Abnormal CT scan, colon    Acute colitis 12/08/2019   Orthostatic hypotension 12/08/2019   Type 1 diabetes mellitus (HCC) 06/30/2019   Hashimoto's disease 06/30/2019   Sjogren's syndrome (HCC) 06/30/2019   Hx of Hypertension 06/30/2019   Neurogenic bladder 06/30/2019   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) 10/02/2016    Past Surgical History:  Procedure Laterality Date   ABDOMINAL HYSTERECTOMY  2008   BILATERAL CARPAL TUNNEL RELEASE Bilateral    ECTOPIC PREGNANCY SURGERY  1989   EXCISION MELANOMA WITH SENTINEL LYMPH NODE BIOPSY Left 10/02/2016  Procedure: WIDE LOCAL EXCISION AND ADVANCEMENT FLAP CLOSURE LEFT UPPER  MELANOMA WITH SENTINEL LYMPH NODE MAPPING AND BIOPSY;  Surgeon: Almond Lint, MD;  Location: [facility];  Service: General;  Laterality: Left;   EYE SURGERY     LAPAROSCOPIC CHOLECYSTECTOMY  ~ 01/2016   MELANOMA EXCISION WITH SENTINEL LYMPH NODE BIOPSY Left 10/02/2016   WIDE LOCAL EXCISION AND ADVANCEMENT FLAP CLOSURE LEFT UPPER  MELANOMA WITH SENTINEL LYMPH NODE MAPPING AND BIOPSY   OVARIAN CYST REMOVAL     SHOULDER ARTHROSCOPY W/ ROTATOR CUFF REPAIR Right 2014   "partial"   STRABISMUS SURGERY Left 1971   STRABISMUS SURGERY Left 03/18/2020   Procedure: STRABISMUS REPAIR;  Surgeon: Verne Carrow, MD;  Location: [facility];  Service: Ophthalmology;  Laterality: Left;   THYROIDECTOMY N/A 03/01/2023   Procedure:  TOTAL THYROIDECTOMY;  Surgeon: Darnell Level, MD;  Location: [facility];  Service: General;  Laterality: N/A;   TUMOR EXCISION  1984   from tail bone and part of tail bone removed    OB History   No obstetric history on file.      Home Medications    Prior to Admission medications   Medication Sig Start Date End Date Taking? Authorizing Provider  rifaximin (XIFAXAN) 550 MG TABS tablet Take 1 tablet (550 mg total) by mouth 3 (three) times daily for 14 days. 05/25/23 06/08/23 Yes Acheron Sugg, Salvadore Oxford, NP  acetaminophen (TYLENOL) 500 MG tablet Take 1,000 mg by mouth every 6 (six) hours as needed for moderate pain.    [provider]  benzocaine (ORAJEL) 10 % mucosal gel Use as directed 1 Application in the mouth or throat as needed for mouth pain.    [provider]  calcium carbonate (TUMS) 500 MG chewable tablet Chew 2 tablets (400 mg of elemental calcium total) by mouth 2 (two) times daily. 03/01/23   Darnell Level, MD  Cholecalciferol (VITAMIN D3) 125 MCG (5000 UT) capsule Take 5,000 Units by mouth daily.    [provider]  Cyanocobalamin (B-12) 3000 MCG CAPS Take 3,000 mcg by mouth daily.    [provider]  diclofenac Sodium (VOLTAREN) 1 % GEL Apply 2 g topically 4 (four) times daily. Patient taking differently: Apply 2 g topically 4 (four) times daily as needed (pain). 12/17/19   Darr, Gerilyn Pilgrim, PA-C  estrogens, conjugated, (PREMARIN) 0.45 MG tablet Take 0.45 mg by mouth daily.    [provider]  famotidine (PEPCID) 20 MG tablet Take 20 mg by mouth 2 (two) times daily.    [provider]  fluconazole (DIFLUCAN) 150 MG tablet Take 150 mg by mouth See admin instructions. Take 150 mg every 3 days for 3 doses starting on the 15th of every month    [provider]  Insulin Detemir (LEVEMIR FLEXTOUCH) 100 UNIT/ML Pen Inject 4 Units into the skin 2 (two) times daily. 09/27/17   [provider]  Insulin lispro (HUMALOG JUNIOR KWIKPEN) 100  UNIT/ML Inject 1-7 Units into the skin as needed (high blood sugar).    [provider]  Ketotifen Fumarate ([facility] ALLERGY EYE DROPS OP) Place 1 drop into both eyes 2 (two) times daily.    [provider]  levothyroxine (SYNTHROID) 88 MCG tablet Take 1 tablet (88 mcg total) by mouth daily before breakfast. 03/01/23   Darnell Level, MD  linaclotide [facility]) 145 MCG CAPS capsule Take 1 capsule (145 mcg total) by mouth daily before breakfast. 05/20/23   Imogene Burn, MD  Magnesium 400 MG  CAPS Take 400 mg by mouth daily.    [provider]  Melatonin 10-10 MG TBCR Take 10 mg by mouth at bedtime.    [provider]  methylphenidate 18 MG PO CR tablet Take 18 mg by mouth 2 (two) times daily.    [provider]  Multiple Vitamins-Minerals (MULTIVITAMINS THER. W/MINERALS) TABS Take 1 tablet by mouth daily.    [provider]  Na Sulfate-K Sulfate-Mg Sulf 17.5-3.13-1.6 GM/177ML SOLN Use as directed; may use generic; goodrx card if insurance will not cover generic 05/20/23   Imogene Burn, MD  omeprazole (PRILOSEC) 40 MG capsule Take 40 mg by mouth at bedtime. 05/04/19   [provider]  ondansetron (ZOFRAN ODT) 4 MG disintegrating tablet Take 1 tablet (4 mg total) by mouth every 8 (eight) hours as needed for nausea or vomiting. 12/07/19   Pricilla Loveless, MD  Polyethyl Glycol-Propyl Glycol (SYSTANE OP) Place 1 drop into both eyes 3 (three) times daily as needed (dry eyes).    [provider]  polyethylene glycol powder (GLYCOLAX/MIRALAX) 17 GM/SCOOP powder Take 17 g by mouth daily.    [provider]  predniSONE (DELTASONE) 5 MG tablet Take 5 mg by mouth 2 (two) times daily.    [provider]  traMADol (ULTRAM) 50 MG tablet Take 1 tablet (50 mg total) by mouth every 6 (six) hours as needed. 03/02/23 03/01/24  Chevis Pretty III, MD  zinc gluconate 50 MG tablet Take 50 mg by mouth daily.    [provider]    Family  History Family History  Problem Relation Age of Onset   Multiple sclerosis Father    Colon cancer Neg Hx    Stomach cancer Neg Hx     Social History Social History   Tobacco Use   Smoking status: Former    Current packs/day: 0.00    Average packs/day: 1.5 packs/day for 24.0 years (36.0 ttl pk-yrs)    Types: Cigarettes    Start date: 3    Quit date: 2008    Years since quitting: 16.5   Smokeless tobacco: Never  Vaping Use   Vaping status: Never Used  Substance Use Topics   Alcohol use: No    Comment: 10/02/2016 "sober since 03/14/2005"   Drug use: Not Currently    Types: Marijuana    Comment: 10/02/2016 " occasional; nothing since before 2006"     Allergies   Doxycycline, Aspirin, Ciprofloxacin, Nsaids, Amoxicillin-pot clavulanate, Gluten meal, Lactose, Codeine, Hydrocodone-acetaminophen, Oxycodone, Sulfa antibiotics, and Sulfasalazine   Review of Systems Review of Systems  Constitutional:  Negative for appetite change, chills and fever.  Respiratory:  Negative for cough and shortness of breath.   Cardiovascular:  Negative for chest pain.  Gastrointestinal:  Positive for abdominal distention, constipation and nausea. Negative for blood in stool, diarrhea and vomiting.  Genitourinary: Negative.   Skin:  Positive for rash.  Neurological:  Negative for weakness and headaches.     Physical Exam Triage Vital Signs ED Triage Vitals  Encounter Vitals Group     BP 05/25/23 1305 120/77     Systolic BP Percentile --      Diastolic BP Percentile --      Pulse Rate 05/25/23 1305 63     Resp 05/25/23 1305 18     Temp 05/25/23 1305 98.4 F (36.9 C)     Temp Source 05/25/23 1305 Oral     SpO2 05/25/23 1305 97 %     Weight --  Height --      Head Circumference --      Peak Flow --      Pain Score 05/25/23 1302 7     Pain Loc --      Pain Education --      Exclude from Growth Chart --    No data found.  Updated Vital Signs BP 120/77 (BP Location: Right Arm)    Pulse 63   Temp 98.4 F (36.9 C) (Oral)   Resp 18   SpO2 97%     Physical Exam Constitutional:      General: She is not in acute distress. Cardiovascular:     Rate and Rhythm: Normal rate and regular rhythm.     Heart sounds: Normal heart sounds.  Pulmonary:     Effort: Pulmonary effort is normal.     Breath sounds: Normal breath sounds.  Abdominal:     General: Abdomen is protuberant. Bowel sounds are increased. There is distension. There is no abdominal bruit.     Palpations: Abdomen is soft. There is no hepatomegaly or splenomegaly.     Tenderness: There is no abdominal tenderness.  Neurological:     Mental Status: She is alert.      UC Treatments / Results  Labs   EKG   Radiology No results found.    Medications Ordered in UC Medications - No data to display  Initial Impression / Assessment and Plan / UC Course  I have reviewed the triage vital signs and the nursing notes.  Pertinent labs & imaging results that were available during my care of the patient were reviewed by me and considered in my medical decision making (see chart for details).     57 year old female with 2-day history of abdominal bloating, nausea, reflux with history of chronic constipation.  She was recently diagnosed with SIBO which has a high rate of recurrence status post treatment which she completed 2 weeks ago with ciprofloxacin.  According to up-to-date, recommendation his Xifaxan 550 mg 3 times daily for 14 days.  Recommend probiotic use.  Continue omeprazole and famotidine as previously prescribed.  She has a prescription for Zofran as needed for nausea.  She already has a follow-up appointment with her GI doctor for upper endoscopy and colonoscopy.  Advised to follow-up if symptoms persist or worsen.  Final Clinical Impressions(s) / UC Diagnoses   Final diagnoses:  Small intestinal bacterial overgrowth (SIBO)     Discharge Instructions      You were seen today for  abdominal bloating, nausea and chronic constipation.  It seems like you are having a flare of your SIBO.  We are treating you with Xifaxan 500 mg 3 times daily for the next 14 days.  It may be helpful if you start a probiotic OTC.  You have Zofran to take as needed for nausea.  Continue omeprazole and famotidine as previously prescribed.  Please follow-up with your PCP or GI doctor symptoms persist.     ED Prescriptions     Medication Sig Dispense Auth. Provider   rifaximin (XIFAXAN) 550 MG TABS tablet Take 1 tablet (550 mg total) by mouth 3 (three) times daily for 14 days. 42 tablet Lorre Munroe, NP      PDMP not reviewed this encounter.     Lorre Munroe, NP 05/25/23 1341

## 2023-05-27 ENCOUNTER — Telehealth: Payer: Self-pay | Admitting: Internal Medicine

## 2023-05-27 NOTE — Telephone Encounter (Signed)
Spoke with the patient. She will take the enema as directed. Begin Linzess 290 mcg total daily. Follow up by phone.

## 2023-05-27 NOTE — Telephone Encounter (Signed)
Patient was seen in urgent care this weekend with complaint of nausea, reflux, severe abdominal bloating and rash of abdomen.  She was given a prescription for nausea and Xifaxan for SIBO. The Xifaxan is held up by her insurance. Patient reports no bowel movement since her enema "last week." She takes Miralax (a little more than a dose) every day along with Linzess 145 mcg. She is willing to repeat the enema. What do you recommend?

## 2023-05-27 NOTE — Telephone Encounter (Signed)
PT is requesting a call back to discuss her new symptoms. She went to urgent care on Saturday about severe nausea, weakness, hot to touch, stomach acid. She is also having SIBO issues along with a rash on her back and stomach. Please advise.

## 2023-05-29 NOTE — Telephone Encounter (Signed)
Inbound call from patient requesting to speak with you. Patient states that her symptoms increase , she is taking probiotic everyday but is not getting better, also stated that Rifaximin [place] not good for her liver.Please advise.

## 2023-05-29 NOTE — Telephone Encounter (Signed)
Spoke with the patient. She did use an enema 2 days ago. She does not say she felt it emptied her. She has not had any bowel movement since then. She is taking Linzess 145 mcg every morning as directed. She is conscientious in making sure she is getting an adequate intake of fluids every day. She has walked some each evening in an effort to help with her constipation. Patient is willing to try a Miralax purge this evening. She has not heard anything from the office that prescribed Xifaxan for her. The pharmacy said they are waiting for what sounds like a prior authorization from insurance to approve the prescription. I explained briefly these things can take time, but she could call that provider office to be certain they have received notice from the pharmacy. We do not have samples at this time. Patient asks if Cipro would be an option for treatment of SIBO. We will talk again tomorrow to see how she is doing. Does this sound reasonable?

## 2023-05-30 ENCOUNTER — Telehealth: Payer: Self-pay

## 2023-05-30 NOTE — Telephone Encounter (Signed)
A message has been sent to our pharmacy team asking for a case to be opened regarding the Xifaxan.

## 2023-05-30 NOTE — Telephone Encounter (Signed)
Spoke to the patient. Patient is slightly worried that the Miralax prep did not work. Denies vomiting. Ab pain has slight cramping but not that severe. She is still passing gas. I asked her to use the enemas twice daily. She would like wait for the rifaximin to be approved. Beth, could we check into the status of her prior authorization. She is still tolerating PO intake and is drinking smoothies for nutritional support. If enemas do not work for inducing a BM by tomorrow., then will plan for CT A/P w/contrast

## 2023-05-30 NOTE — Telephone Encounter (Signed)
Please start PA for Xifaxan 550 mg TID for 14 days. Thank you

## 2023-05-30 NOTE — Telephone Encounter (Signed)
Patient stated that she tried the MiraLax purge and it did not work. Patient is requesting a call back to discuss the next steps. Please advise.

## 2023-05-31 NOTE — Telephone Encounter (Signed)
Mary Erickson called stating that Dr. Leonides Schanz advised her to try 3 enemas yesterday. Mary Erickson stated that after her first one, she felt sick all day and did not take the others. Mary Erickson is requesting a call back to discuss. Please advise.

## 2023-06-03 ENCOUNTER — Other Ambulatory Visit: Payer: Self-pay

## 2023-06-03 MED ORDER — RIFAXIMIN 550 MG PO TABS: 550.0000 mg | ORAL_TABLET | Freq: Three times a day (TID) | ORAL | 0 refills | Status: AC

## 2023-06-04 ENCOUNTER — Other Ambulatory Visit (HOSPITAL_COMMUNITY): Payer: Self-pay

## 2023-06-04 ENCOUNTER — Telehealth: Payer: Self-pay

## 2023-06-04 NOTE — Telephone Encounter (Signed)
New prescription in Dr Derek Mound name for Xifaxan sent to the pharmacy. It will need prior authorization. On 05/30/23 I asked out pharmacy team to start a PA for Xifaxan.

## 2023-06-04 NOTE — Telephone Encounter (Signed)
Pharmacy Patient Advocate Encounter   Received notification from Pt Calls Messages that prior authorization for Xifaxan 550MG  tablets is required/requested.   Insurance verification completed.   The patient is insured through  [employer]  .   Per test claim: PA submitted to [employer] via CoverMyMeds Key/confirmation #/EOC [id number] Status is pending

## 2023-06-04 NOTE — Telephone Encounter (Signed)
PA request has been Submitted. New Encounter created for follow up. For additional info see Pharmacy Prior Auth telephone encounter from 06/04/2023.

## 2023-06-04 NOTE — Telephone Encounter (Signed)
Our pharmacy team confirmed a PA has been started.

## 2023-06-05 ENCOUNTER — Other Ambulatory Visit (HOSPITAL_COMMUNITY): Payer: Self-pay

## 2023-06-05 NOTE — Telephone Encounter (Signed)
Pharmacy Patient Advocate Encounter  Received notification from [facility] that Prior Authorization for XIFAXAN 550MG  has been APPROVED from 7.23.24 to 8.23.24. Ran test claim, Copay is $ALREADY FILLED AT PHARMACY.  PA #/Case ID/Reference #: [id number]

## 2023-06-17 ENCOUNTER — Telehealth: Payer: Self-pay | Admitting: Internal Medicine

## 2023-06-17 NOTE — Telephone Encounter (Signed)
Patient is taking the Xifaxan for SIBO. She feels very nauseated with this medication but is continuing it. She has taken Linzess 290 mcg and Miralax. She had "very large water stool." She decreased the Linzess to 72 mcg but she is not sure if that was the right thing to do. She is "drinking an incredible amount of water but my urine remains cloudy." She is taking Pepcid 20 mg q AM and Pepcid 40 mg at bedtime. She will take an extra Pepcid 20 mg in the afternoon sometimes. She continues to have reflux despite this.  Patient states she is unsure if she should continue with these medications, if being on "the high dose of acid reducing medication is working against my SIBO" and many other concerns. Office visit made for the patient.

## 2023-06-17 NOTE — Telephone Encounter (Signed)
Patient called states she is feeling really sick from the Xifaxan medication would like to discuss further with a nurse since she is on other medications is having a lost of appetite not sure what she should be doing next. Seeking further advise.

## 2023-06-18 ENCOUNTER — Encounter: Payer: Self-pay | Admitting: Physician Assistant

## 2023-06-18 ENCOUNTER — Ambulatory Visit (INDEPENDENT_AMBULATORY_CARE_PROVIDER_SITE_OTHER): Payer: MEDICAID | Admitting: Physician Assistant

## 2023-06-18 ENCOUNTER — Ambulatory Visit (INDEPENDENT_AMBULATORY_CARE_PROVIDER_SITE_OTHER)
Admission: RE | Admit: 2023-06-18 | Discharge: 2023-06-18 | Disposition: A | Discharge status: Home / Self Care | Payer: MEDICAID | Source: Ambulatory Visit | Attending: Physician Assistant | Admitting: Physician Assistant

## 2023-06-18 ENCOUNTER — Other Ambulatory Visit (INDEPENDENT_AMBULATORY_CARE_PROVIDER_SITE_OTHER): Payer: MEDICAID

## 2023-06-18 DIAGNOSIS — K638219 Small intestinal bacterial overgrowth, unspecified: Secondary | ICD-10-CM

## 2023-06-18 DIAGNOSIS — K5900 Constipation, unspecified: Secondary | ICD-10-CM

## 2023-06-18 DIAGNOSIS — K219 Gastro-esophageal reflux disease without esophagitis: Secondary | ICD-10-CM

## 2023-06-18 DIAGNOSIS — R140 Abdominal distension (gaseous): Secondary | ICD-10-CM

## 2023-06-18 DIAGNOSIS — R197 Diarrhea, unspecified: Secondary | ICD-10-CM

## 2023-06-18 DIAGNOSIS — K5909 Other constipation: Secondary | ICD-10-CM | POA: Diagnosis not present

## 2023-06-18 LAB — CBC WITH DIFFERENTIAL/PLATELET
Basophils Absolute: 0.1 10*3/uL (ref 0.0–0.1)
Basophils Relative: 0.7 % (ref 0.0–3.0)
Eosinophils Absolute: 0.1 10*3/uL (ref 0.0–0.7)
Eosinophils Relative: 1.1 % (ref 0.0–5.0)
HCT: 42.5 % (ref 36.0–46.0)
Hemoglobin: 13.9 g/dL (ref 12.0–15.0)
Lymphocytes Relative: 19.6 % (ref 12.0–46.0)
Lymphs Abs: 1.5 10*3/uL (ref 0.7–4.0)
MCHC: 32.7 g/dL (ref 30.0–36.0)
MCV: 95.1 fl (ref 78.0–100.0)
Monocytes Absolute: 0.6 10*3/uL (ref 0.1–1.0)
Monocytes Relative: 7.6 % (ref 3.0–12.0)
Neutro Abs: 5.5 10*3/uL (ref 1.4–7.7)
Neutrophils Relative %: 71 % (ref 43.0–77.0)
Platelets: 276 10*3/uL (ref 150.0–400.0)
RBC: 4.47 Mil/uL (ref 3.87–5.11)
RDW: 14.4 % (ref 11.5–15.5)
WBC: 7.7 10*3/uL (ref 4.0–10.5)

## 2023-06-18 LAB — BASIC METABOLIC PANEL
BUN: 13 mg/dL (ref 6–23)
CO2: 30 mEq/L (ref 19–32)
Calcium: 10.2 mg/dL (ref 8.4–10.5)
Chloride: 99 mEq/L (ref 96–112)
Creatinine, Ser: 1 mg/dL (ref 0.40–1.20)
GFR: 62.63 mL/min (ref >60.00)
Glucose, Bld: 99 mg/dL (ref 70–99)
Potassium: 3.7 mEq/L (ref 3.5–5.1)
Sodium: 139 mEq/L (ref 135–145)

## 2023-06-18 MED ORDER — ONDANSETRON 4 MG PO TBDP: 4.0000 mg | ORAL_TABLET | Freq: Four times a day (QID) | ORAL | 1 refills | Status: DC | PRN

## 2023-06-18 MED ORDER — LINACLOTIDE 72 MCG PO CAPS: ORAL_CAPSULE | ORAL | 6 refills | Status: DC

## 2023-06-18 NOTE — Patient Instructions (Addendum)
_______________________________________________________  If your blood pressure at your visit was 140/90 or greater, please contact your primary care physician to follow up on this.  If you are age 57 or younger, your body mass index should be between 19-25. Your Body mass index is 19.92 kg/m. If this is out of the aformentioned range listed, please consider follow up with your Primary Care Provider.  ________________________________________________________  The [facility] providers would like to encourage you to use [facility] to communicate with providers for non-urgent requests or questions.  Due to long hold times on the telephone, sending your provider a message by [facility] may be a faster and more efficient way to get a response.  Please allow 48 business hours for a response.  Please remember that this is for non-urgent requests.  _______________________________________________________  TRY to decrease Pepcid 20mg  one tablet twice daily.  TRY Linzess alternating with 145 mcg daily   FINISH Xifaxan  CONTINUE: Zofran 4mg  every 6 hours as needed.  Your provider has requested that you go to the basement level for lab work before leaving today. Press "B" on the elevator. The lab is located at the first door on the left as you exit the elevator.  Your provider has requested that you have an abdominal x ray before leaving today. Please go to the basement floor to our Radiology department for the test.  Due to recent changes in healthcare laws, you may see the results of your imaging and laboratory studies on MyChart before your provider has had a chance to review them.  We understand that in some cases there may be results that are confusing or concerning to you. Not all laboratory results come back in the same time frame and the provider may be waiting for multiple results in order to interpret others.  Please give [place] 48 hours in order for your provider to thoroughly review all the results  before contacting the office for clarification of your results.   Thank you for entrusting me with your care and choosing [facility].  Amy Esterwood, PA-C

## 2023-06-18 NOTE — Progress Notes (Signed)
Subjective:    Patient ID: Mary Erickson, female    DOB: 07/14/1966, 57 y.o.   MRN: [id number]  HPI Mary Erickson is a pleasant 57 year old white female established with Dr. Leonides Schanz who was last seen on 05/20/2023 and comes back in today for follow-up and persistent complaints. Patient has history of chronic constipation, prior history of SIBO for which she had been treated, chronic GERD, history of IBS, insulin-dependent diabetes mellitus, Sjogren syndrome, history of Hashimoto's disease and multinodular goiter for which she underwent thyroidectomy in the spring 2024. Patient says that she was put on high doses of "PPIs after surgery which she has been trying to get herself weaned back off of and is concerned that the higher doses of PPIs had aggravated SIBO symptoms. When she was seen in July by Dr. Leonides Schanz her Linzess was increased from 72 mcg to 145 mcg daily, at 1 point even that was not working and she increased to 290 mcg daily and then welled up with terrible diarrhea.  She has gone back to 72 mcg/day but now has not had a bowel movement over the past 2 days. She had called back feeling that her SIBO symptoms were much worse and has been placed on a course of Xifaxan which she is finishing with 3 days left.  She has been very nauseated on the Xifaxan and requiring some Zofran use. Regarding GERD symptoms currently she is taking Pepcid up to 80 mg daily, says most days 60 mg in divided doses.  Heartburn is fairly well-controlled at present. She is very worried today about how constipated she may be and whether or not she is going to be able to prep for colonoscopy which is scheduled for 07/24/2023.  She was plan to do a 2-day prep with Suprep and MiraLAX prep.  Review of Systems Pertinent positive and negative review of systems were noted in the above HPI section.  All other review of systems was otherwise negative.   Outpatient Encounter Medications as of 06/18/2023  Medication Sig    acetaminophen (TYLENOL) 500 MG tablet Take 1,000 mg by mouth every 6 (six) hours as needed for moderate pain.   benzocaine (ORAJEL) 10 % mucosal gel Use as directed 1 Application in the mouth or throat as needed for mouth pain.   calcium carbonate (TUMS) 500 MG chewable tablet Chew 2 tablets (400 mg of elemental calcium total) by mouth 2 (two) times daily.   Cholecalciferol (VITAMIN D3) 125 MCG (5000 UT) capsule Take 5,000 Units by mouth daily.   Cyanocobalamin (B-12) 3000 MCG CAPS Take 3,000 mcg by mouth daily.   diclofenac Sodium (VOLTAREN) 1 % GEL Apply 2 g topically 4 (four) times daily. (Patient taking differently: Apply 2 g topically 4 (four) times daily as needed (pain).)   estrogens, conjugated, (PREMARIN) 0.45 MG tablet Take 0.45 mg by mouth daily.   famotidine (PEPCID) 20 MG tablet Take 20 mg by mouth 2 (two) times daily.   fluconazole (DIFLUCAN) 150 MG tablet Take 150 mg by mouth See admin instructions. Take 150 mg every 3 days for 3 doses starting on the 15th of every month   Insulin Detemir (LEVEMIR FLEXTOUCH) 100 UNIT/ML Pen Inject 4 Units into the skin 2 (two) times daily.   Insulin lispro (HUMALOG JUNIOR KWIKPEN) 100 UNIT/ML Inject 1-7 Units into the skin as needed (high blood sugar).   Ketotifen Fumarate ([employer] ALLERGY EYE DROPS OP) Place 1 drop into both eyes 2 (two) times daily.   levothyroxine (SYNTHROID) 88 MCG  tablet Take 1 tablet (88 mcg total) by mouth daily before breakfast.   Magnesium 400 MG CAPS Take 400 mg by mouth daily.   Melatonin 10-10 MG TBCR Take 10 mg by mouth at bedtime.   methylphenidate 18 MG PO CR tablet Take 18 mg by mouth 2 (two) times daily.   Multiple Vitamins-Minerals (MULTIVITAMINS THER. W/MINERALS) TABS Take 1 tablet by mouth daily.   Na Sulfate-K Sulfate-Mg Sulf 17.5-3.13-1.6 GM/177ML SOLN Use as directed; may use generic; goodrx card if insurance will not cover generic   omeprazole (PRILOSEC) 40 MG capsule Take 40 mg by mouth at bedtime.   Polyethyl  Glycol-Propyl Glycol (SYSTANE OP) Place 1 drop into both eyes 3 (three) times daily as needed (dry eyes).   polyethylene glycol powder (GLYCOLAX/MIRALAX) 17 GM/SCOOP powder Take 17 g by mouth daily.   predniSONE (DELTASONE) 5 MG tablet Take 5 mg by mouth 2 (two) times daily.   traMADol (ULTRAM) 50 MG tablet Take 1 tablet (50 mg total) by mouth every 6 (six) hours as needed.   zinc gluconate 50 MG tablet Take 50 mg by mouth daily.   [DISCONTINUED] linaclotide (LINZESS) 145 MCG CAPS capsule Take 1 capsule (145 mcg total) by mouth daily before breakfast.   [DISCONTINUED] ondansetron (ZOFRAN ODT) 4 MG disintegrating tablet Take 1 tablet (4 mg total) by mouth every 8 (eight) hours as needed for nausea or vomiting.   linaclotide (LINZESS) 72 MCG capsule Take 72 mcg (capsule) alternating with 145 mcg (2 capsules) daily   ondansetron (ZOFRAN ODT) 4 MG disintegrating tablet Take 1 tablet (4 mg total) by mouth every 6 (six) hours as needed for nausea or vomiting.   [DISCONTINUED] ondansetron (ZOFRAN ODT) 4 MG disintegrating tablet Take 1 tablet (4 mg total) by mouth every 6 (six) hours as needed for nausea or vomiting.   No facility-administered encounter medications on file as of 06/18/2023.   Allergies  Allergen Reactions   Doxycycline Other (See Comments)    Elevates liver enzymes    Aspirin Other (See Comments)    Can take 81 mg but has to be coated; No other Aspirin due to bleeding in stools from ulcers   Ciprofloxacin Hives    Per pt, she can tolerate oral but not IV   Nsaids Other (See Comments)    GI issues    Amoxicillin-Pot Clavulanate Nausea Only   Gluten Meal     Avoid due to autoimmune issues    Lactose     Avoid due to autoimmune issues    Codeine Nausea And Vomiting   Hydrocodone-Acetaminophen Nausea And Vomiting   Oxycodone Nausea And Vomiting   Sulfa Antibiotics Nausea And Vomiting   Sulfasalazine Nausea And Vomiting   Patient Active Problem List   Diagnosis Date Noted    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   Goiter, toxic, multinodular 02/24/2023   Hyponatremia 04/17/2021   IBS (irritable bowel syndrome) 04/17/2021   Dizziness 12/11/2019   Colitis 12/11/2019   Abnormal CT scan, colon    Acute colitis 12/08/2019   Orthostatic hypotension 12/08/2019   Type 1 diabetes mellitus (HCC) 06/30/2019   Hashimoto's disease 06/30/2019   Sjogren's syndrome (HCC) 06/30/2019   Hx of Hypertension 06/30/2019   Neurogenic bladder 06/30/2019   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) 10/02/2016   Social History   Socioeconomic History   Marital status: Divorced    Spouse name: Not on file   Number of children: Not on file   Years of education: Not on file  Highest education level: Not on file  Occupational History   Not on file  Tobacco Use   Smoking status: Former    Current packs/day: 0.00    Average packs/day: 1.5 packs/day for 24.0 years (36.0 ttl pk-yrs)    Types: Cigarettes    Start date: 61    Quit date: 2008    Years since quitting: 16.6   Smokeless tobacco: Never  Vaping Use   Vaping status: Never Used  Substance and Sexual Activity   Alcohol use: No    Comment: 10/02/2016 "sober since 03/14/2005"   Drug use: Not Currently    Types: Marijuana    Comment: 10/02/2016 " occasional; nothing since before 2006"   Sexual activity: Never  Other Topics Concern   Not on file  Social History Narrative   Not on file   Social Determinants of Health   Financial Resource Strain: Low Risk  (01/23/2023)   Received from [facility], [facility]   Overall Financial Resource Strain (CARDIA)    Difficulty of Paying Living Expenses: Not hard at all  Food Insecurity: No Food Insecurity (03/01/2023)   Hunger Vital Sign    Worried About Running Out of Food in the Last Year: Never true    Ran Out of Food in the Last Year: Never true  Transportation Needs: No Transportation Needs (03/01/2023)   PRAPARE - Administrator, Civil Service  (Medical): No    Lack of Transportation (Non-Medical): No  Physical Activity: Insufficiently Active (07/25/2022)   [facility]   Exercise Vital Sign    Days of Exercise per Week: 4 days    Minutes of Exercise per Session: 30 min  Stress: No Stress Concern Present (07/25/2022)   Received from [facility], [facility], [facility]   [facility] - Occupational Stress Questionnaire    Feeling of Stress : Only a little  Social Connections: Moderately Integrated (07/25/2022)   [facility], [facility]   Social Network    How would you rate your social network (family, work, friends)?: Adequate participation with social networks  Intimate Partner Violence: Not At Risk (03/01/2023)   Humiliation, Afraid, Rape, and Kick questionnaire    Fear of Current or Ex-Partner: No    Emotionally Abused: No    Physically Abused: No    Sexually Abused: No    Mary Erickson's family history includes Multiple sclerosis in her father.      Objective:    Vitals:   06/18/23 1030  BP: 122/76  Pulse: 68    Physical Exam Well-developed thin older white female in no acute distress.  Height, Weight, 131 BMI 19.9  HEENT; nontraumatic normocephalic, EOMI, PE R LA, sclera anicteric. Oropharynx; not examined today Neck; supple, no JVD Cardiovascular; regular rate and rhythm with S1-S2, no murmur rub or gallop Pulmonary; Clear bilaterally Abdomen; soft, not visibly distended, no tympany, no focal tenderness, no palpable mass or hepatosplenomegaly, bowel sounds are active Rectal; not done today Skin; benign exam, no jaundice rash or appreciable lesions Extremities; no clubbing cyanosis or edema skin warm and dry Neuro/Psych; alert and oriented x4, grossly nonfocal mood and affect appropriate        Assessment & Plan:   #44 57 year old white female with insulin-dependent diabetes mellitus, history  of Sjogren's, history of Hashimoto's status post thyroidectomy April 2024. With long-term chronic constipation recent episode of severe diarrhea with high-dose Linzess, now back to 72  mcg daily and has not had a bowel movement in the past couple of days.  #2 history of SIBO-exacerbation of bloating and distention over the past month and now taking a course of Xifaxan 550 3 times daily with 3 days remaining.  No significant improvement in symptoms as yet and nausea as a side effect  #3 chronic GERD-patient worrying about PPIs exacerbating SIBO however she is on famotidine currently up to 60 mg daily.  With fairly good control of heartburn symptoms we will continue weaning  #4 colon cancer screening-scheduled for upcoming colon and EGD on 07/24/2023  #5 hypertension 6.  Prior history of melanoma 7.  Seizure disorder  Plan; decrease famotidine to 20 mg p.o. twice daily, reassured her that this will not completely disable her acid pumps. Check KUB today Check CBC and be met, concerned about dehydration after recent severe episode of diarrhea Try Linzess 72 mcg alternating with 145 mcg daily, have sent a refill for Linzess at 72 mcg to allow for 2 tablets daily. We discussed doing a MiraLAX purge which she is familiar with as necessary Will wait for results of KUB and then decide about further purging. Will encourage her to keep her appointment for colonoscopy and EGD with Dr. Leonides Schanz on 07/24/2023. Complete the course of Xifaxan I have refilled Zofran 4 mg every 6 hours for as needed use.   S  PA-C 06/18/2023   Cc: Barnie Mort, PA-C

## 2023-06-19 NOTE — Progress Notes (Signed)
I agree with the assessment and plan as outlined by Ms. Esterwood. ?

## 2023-06-21 NOTE — Telephone Encounter (Signed)
06/05/23  7:31 PM Note Pharmacy Patient Advocate Encounter   Received notification from [facility] that Prior Authorization for [facility]MG  has been APPROVED from 7.23.24 to 8.23.24. Ran test claim, Copay is $ALREADY FILLED AT PHARMACY.  PA #/Case ID/Reference #: [id number]

## 2023-06-26 ENCOUNTER — Telehealth: Payer: Self-pay | Admitting: Internal Medicine

## 2023-06-26 NOTE — Telephone Encounter (Signed)
Returned patient call. No answer. Left message of my call.

## 2023-06-26 NOTE — Telephone Encounter (Signed)
Inbound call from patient stating she recently finished antibiotics she was prescribed she is now wondering if it is okay to take Diflucan medication. States she would like to be advised due to having elevated liver enzymes. Patient would like recommendations sent through mychart. Please advise, thank you.

## 2023-07-03 ENCOUNTER — Telehealth: Payer: Self-pay | Admitting: Physician Assistant

## 2023-07-03 NOTE — Telephone Encounter (Signed)
Patient had a "terrible headache" and after 2 days she took an ibuprofen. The next day she had reflux symptoms. She added TUMs to her regimen. By the 2nd day her reflux was bad enough that she took omeprazole morning and evening. She began improving in 24 hours. She has repeated this today. Calls to ask if she should continue this for tomorrow as well. Suggested if she was feeling asymptomatic, she could stop with omeprazole and continue with her famotidine. She can call us if she fails to continue improving.  Checked schedule for sooner openings for her procedures. Patient is scheduled in the first opening.

## 2023-07-03 NOTE — Telephone Encounter (Signed)
Inbound call from patient stating she is experiencing severe GERD symptoms. States she is feeling very sick. States she started taking omeprazole last night. Patient requesting a call back to discuss further. Also stated she did receive message regarding miralax in previous note. Please advise, thank you.

## 2023-07-05 NOTE — Telephone Encounter (Signed)
Inbound call from patient stating her symptoms are getting worse. Patient requesting a call to be advised on what could help before procedure date. Please advise, thank you.

## 2023-07-05 NOTE — Telephone Encounter (Signed)
The patient continues to have her migraine. Concerns that this may be worsened by the famotidine. Patient also read that famotidine can cause constipation. She remains constipated but plans to purge today even though she dreads this because last time it made her so sick. She feels an antibiotic is indicated. Concerns about how it may affect her liver. She thinks Cipro was the better tolerated antibiotic and wants this considered. She felt better after she had taken the Cipro.

## 2023-07-08 NOTE — Telephone Encounter (Signed)
She did not do the purge. She reports"welts" after eating grapes. She has ordered a different juice. She states she cannot use grape, or apple juices. She has plans of doing the purge once her new juice arrives.

## 2023-07-10 ENCOUNTER — Other Ambulatory Visit: Payer: Self-pay

## 2023-07-10 MED ORDER — MOTEGRITY 2 MG PO TABS: 2.0000 mg | ORAL_TABLET | Freq: Every day | ORAL | 3 refills | Status: DC

## 2023-07-10 NOTE — Telephone Encounter (Signed)
Patient is advised of the recommendations and is agreeable to starting Motegrity 2 mg once daily. Pharmacy confirmed and prescription sent. Patient will continue daily Miralax. She will strongly consider doing the Miralax purge.

## 2023-07-10 NOTE — Telephone Encounter (Signed)
Patient has not done the purge with Miralax. She is afraid she will "get sick" from the purge. She explains that she can feel the "acid build and I become very ill." She wants Cipro to have on hand if she does get sick. Patient thinks she can use the Cipro to treat SIBO. Patient reports taking daily Miralax and Linzess 72 mcg to 145 mcg daily. "This worked for a little while." She last passed stool 2 days ago. The stool was hard and of small quantity. She is now passing "lots of gas." She has taken Zofran today for her nausea. She does not have any urge to move her bowels. She has taken Tylenol for her "joint pain from my Sjogren's syndrome." Patient has not used an enema, stating "they don't always do anything." Her colonoscopy is scheduled for 07/24/23. Do you have any recommendations?

## 2023-07-10 NOTE — Telephone Encounter (Signed)
Inbound call from patient wishing to speak about antibiotic. States she is still having same symptoms and have not gotten any better. States she will proceed with purge but would like antibiotics, as stated in previous notes. Patient requesting a call back. Please advise, thank you.

## 2023-07-11 NOTE — Telephone Encounter (Signed)
Patient was concerned that the Motegrity was similar to Wellbutrin. She states"I do not do well with serotonin medicines." She is specifically concerned about depression and suicidal thoughts. Questioned her about any issues with her mental state while on Linzess. Denies feeling depressed or "not myself" while taking Linzess. Has taken Linzess again today. Patient is in counseling with the therapist she has seen for 6 years. Please advise.

## 2023-07-11 NOTE — Telephone Encounter (Signed)
She is willing to try the Motegrity. Agrees to increasing the Miralax. PT will be difficult for her because she does not drive. She uses her bicycle except when it is so hot.

## 2023-07-11 NOTE — Telephone Encounter (Signed)
Patient called stated she tried the purge and it did not help much. Also stated she is not bale to take the Motegrity because of one ingredient that it has. Please advise.

## 2023-07-12 NOTE — Telephone Encounter (Signed)
PT is calling about Motegrity. She cannot take the medication. Feels its not a good idea. Seeking another option. Please advise.

## 2023-07-12 NOTE — Telephone Encounter (Signed)
Patient has decided not to try Motegrity. She is going to take Linzess, though not committing to 145 mcg daily, but maybe 145 mcg alternating with 72 mch. Increase Miralax to BID. She knows PT is an option if she were to have a solution for her transportation.

## 2023-07-15 ENCOUNTER — Other Ambulatory Visit: Payer: Self-pay

## 2023-07-15 ENCOUNTER — Emergency Department (HOSPITAL_COMMUNITY)
Admission: EM | Admit: 2023-07-15 | Discharge: 2023-07-15 | Disposition: A | Discharge status: Home / Self Care | Payer: MEDICAID | Attending: Emergency Medicine | Admitting: Emergency Medicine

## 2023-07-15 DIAGNOSIS — R110 Nausea: Secondary | ICD-10-CM | POA: Diagnosis not present

## 2023-07-15 DIAGNOSIS — Z87891 Personal history of nicotine dependence: Secondary | ICD-10-CM | POA: Diagnosis not present

## 2023-07-15 DIAGNOSIS — R1013 Epigastric pain: Secondary | ICD-10-CM | POA: Diagnosis not present

## 2023-07-15 DIAGNOSIS — E1310 Other specified diabetes mellitus with ketoacidosis without coma: Secondary | ICD-10-CM | POA: Insufficient documentation

## 2023-07-15 DIAGNOSIS — Z85820 Personal history of malignant melanoma of skin: Secondary | ICD-10-CM | POA: Diagnosis not present

## 2023-07-15 DIAGNOSIS — R195 Other fecal abnormalities: Secondary | ICD-10-CM | POA: Insufficient documentation

## 2023-07-15 DIAGNOSIS — R42 Dizziness and giddiness: Secondary | ICD-10-CM | POA: Diagnosis not present

## 2023-07-15 DIAGNOSIS — Z794 Long term (current) use of insulin: Secondary | ICD-10-CM | POA: Diagnosis not present

## 2023-07-15 DIAGNOSIS — E039 Hypothyroidism, unspecified: Secondary | ICD-10-CM | POA: Diagnosis not present

## 2023-07-15 LAB — CBC WITH DIFFERENTIAL/PLATELET
Abs Immature Granulocytes: 0.06 10*3/uL (ref 0.00–0.07)
Basophils Absolute: 0.1 10*3/uL (ref 0.0–0.1)
Basophils Relative: 1 %
Eosinophils Absolute: 0.1 10*3/uL (ref 0.0–0.5)
Eosinophils Relative: 1 %
HCT: 40 % (ref 36.0–46.0)
Hemoglobin: 12.8 g/dL (ref 12.0–15.0)
Immature Granulocytes: 1 %
Lymphocytes Relative: 18 %
Lymphs Abs: 1.9 10*3/uL (ref 0.7–4.0)
MCH: 30.9 pg (ref 26.0–34.0)
MCHC: 32 g/dL (ref 30.0–36.0)
MCV: 96.6 fL (ref 80.0–100.0)
Monocytes Absolute: 0.8 10*3/uL (ref 0.1–1.0)
Monocytes Relative: 8 %
Neutro Abs: 7.4 10*3/uL (ref 1.7–7.7)
Neutrophils Relative %: 71 %
Platelets: 298 10*3/uL (ref 150–400)
RBC: 4.14 MIL/uL (ref 3.87–5.11)
RDW: 13.2 % (ref 11.5–15.5)
WBC: 10.3 10*3/uL (ref 4.0–10.5)
nRBC: 0 % (ref 0.0–0.2)

## 2023-07-15 LAB — COMPREHENSIVE METABOLIC PANEL
ALT: 40 U/L (ref 0–44)
AST: 48 U/L — ABNORMAL HIGH (ref 15–41)
Albumin: 3.7 g/dL (ref 3.5–5.0)
Alkaline Phosphatase: 88 U/L (ref 38–126)
Anion gap: 7 (ref 5–15)
BUN: 10 mg/dL (ref 6–20)
CO2: 29 mmol/L (ref 22–32)
Calcium: 8.9 mg/dL (ref 8.9–10.3)
Chloride: 98 mmol/L (ref 98–111)
Creatinine, Ser: 0.77 mg/dL (ref 0.44–1.00)
GFR, Estimated: >60 mL/min (ref >60)
Glucose, Bld: 162 mg/dL — ABNORMAL HIGH (ref 70–99)
Potassium: 4 mmol/L (ref 3.5–5.1)
Sodium: 134 mmol/L — ABNORMAL LOW (ref 135–145)
Total Bilirubin: 0.7 mg/dL (ref 0.3–1.2)
Total Protein: 7.2 g/dL (ref 6.5–8.1)

## 2023-07-15 LAB — POC OCCULT BLOOD, ED: Fecal Occult Bld: NEGATIVE

## 2023-07-15 LAB — PROTIME-INR
INR: 0.9 (ref 0.8–1.2)
Prothrombin Time: 12.8 s (ref 11.4–15.2)

## 2023-07-15 LAB — TYPE AND SCREEN
ABO/RH(D): O POS
Antibody Screen: NEGATIVE

## 2023-07-15 MED ORDER — ONDANSETRON 8 MG PO TBDP: 8.0000 mg | ORAL_TABLET | Freq: Once | ORAL | Status: DC

## 2023-07-15 MED ORDER — ALUM & MAG HYDROXIDE-SIMETH 200-200-20 MG/5ML PO SUSP
30.0000 mL | Freq: Once | ORAL | Status: AC
  Administered 2023-07-15: 30 mL via ORAL
  Filled 2023-07-15: qty 30

## 2023-07-15 NOTE — ED Triage Notes (Signed)
Pt from home via [facility] with reports of worsening GI bleed with dark and grey colored stools over 2 months. C/o nausea. Significant GI hx.

## 2023-07-15 NOTE — ED Provider Notes (Signed)
[place] EMERGENCY DEPARTMENT AT [facility] Provider Note  CSN: [id number] Arrival date & time: 07/15/23 3086  Chief Complaint(s) GI Problem  HPI Mary Erickson is a 56 y.o. female history of diabetes, SIBO, Hashimoto's disease, chronic constipation presenting to the emergency department with dark stool.  Patient reports that she has had dark stools since yesterday, concerned that she has a GI bleed that she takes a lot of NSAIDs.  She reports that she has chronic abdominal discomfort, nausea, diarrhea and constipation.  She reports mild lightheadedness.  She follows with GI and has an endoscopy scheduled in 9 days.  No bright red blood in the stool.  No vomiting.  No syncope.  She reports that she had an episode of dark stools earlier in the month which resolved.   Past Medical History Past Medical History:  Diagnosis Date   Acid reflux    ADHD (attention deficit hyperactivity disorder)    Anemia    Arthritis    "elbows, hands, neck, shoulders" (10/02/2016)   Calcium blood increased    Chronic neck pain    Chronic UTI (urinary tract infection)    "from self caths" (10/02/2016)   Hashimoto's disease    Hepatitis B    acute hepatitis B from lancet   History of alcoholism (HCC)    10/02/2016 "sober since 03/14/2005"   History of blood transfusion 1984; 1989; 2008   "quite a few"   History of shortness of breath    Hypercholesteremia    Hypothyroidism    no meds   Internal carotid aneurysm dx'd early 2017   Legally blind in left eye, as defined in [place]    Melanoma [facility])    left upper arm   Migraines    "quite a few in a month; at least 15" (10/02/2016)   Neuropathy    neck; "not sure if it's from DM or Sjogren's" (10/02/2016)   Pneumonia 1986   "1 yr after major OR when I was under anesthesia for 13 hr"   Seizures (HCC) ~ 2004; ~ 2013   "seizures from diabetes "   Self-catheterizes urinary bladder    "since OR in 1984" (10/02/2016)   Sjogren's syndrome  (HCC)    Thyroid goiter    "still present" (10/02/2016)   Type II diabetes mellitus (HCC)    Urinary retention    self caths   Wears glasses    Patient Active Problem List   Diagnosis Date Noted   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 Authorizing Provider  acetaminophen (TYLENOL) 500 MG tablet Take 1,000 mg by mouth every 6 (six) hours as needed for moderate pain.    [provider]  benzocaine (ORAJEL) 10 % mucosal gel Use as directed 1 Application in the mouth or throat as needed for mouth pain.    [provider]  calcium carbonate (TUMS) 500 MG chewable tablet Chew 2 tablets (400 mg of elemental  calcium total) by mouth 2 (two) times daily. 03/01/23   Darnell Level, MD  Cholecalciferol (VITAMIN D3) 125 MCG (5000 UT) capsule Take 5,000 Units by mouth daily.    [provider]  Cyanocobalamin (B-12) 3000 MCG CAPS Take 3,000 mcg by mouth daily.    [provider]  diclofenac Sodium (VOLTAREN) 1 % GEL Apply 2 g topically 4 (four) times daily. Patient taking differently: Apply 2 g topically 4 (four) times daily as needed (pain). 12/17/19   Darr, Gerilyn Pilgrim, PA-C  estrogens, conjugated, (PREMARIN) 0.45 MG tablet Take 0.45 mg by mouth daily.    [provider]  famotidine (PEPCID) 20 MG tablet Take 20 mg by mouth 2 (two) times daily.    [provider]  fluconazole  (DIFLUCAN) 150 MG tablet Take 150 mg by mouth See admin instructions. Take 150 mg every 3 days for 3 doses starting on the 15th of every month    [provider]  Insulin Detemir (LEVEMIR FLEXTOUCH) 100 UNIT/ML Pen Inject 4 Units into the skin 2 (two) times daily. 09/27/17   [provider]  Insulin lispro (HUMALOG JUNIOR KWIKPEN) 100 UNIT/ML Inject 1-7 Units into the skin as needed (high blood sugar).    [provider]  Ketotifen Fumarate ([employer] ALLERGY EYE DROPS OP) Place 1 drop into both eyes 2 (two) times daily.    [provider]  levothyroxine (SYNTHROID) 88 MCG tablet Take 1 tablet (88 mcg total) by mouth daily before breakfast. 03/01/23   Darnell Level, MD  Magnesium 400 MG CAPS Take 400 mg by mouth daily.    [provider]  Melatonin 10-10 MG TBCR Take 10 mg by mouth at bedtime.    [provider]  methylphenidate 18 MG PO CR tablet Take 18 mg by mouth 2 (two) times daily.    [provider]  Multiple Vitamins-Minerals (MULTIVITAMINS THER. W/MINERALS) TABS Take 1 tablet by mouth daily.    [provider]  Na Sulfate-K Sulfate-Mg Sulf 17.5-3.13-1.6 GM/177ML SOLN Use as directed; may use generic; goodrx card if insurance will not cover generic 05/20/23   Imogene Burn, MD  omeprazole (PRILOSEC) 40 MG capsule Take 40 mg by mouth at bedtime. 05/04/19   [provider]  ondansetron (ZOFRAN ODT) 4 MG disintegrating tablet Take 1 tablet (4 mg total) by mouth every 6 (six) hours as needed for nausea or vomiting. 06/18/23   Esterwood, Amy S, PA-C  Polyethyl Glycol-Propyl Glycol (SYSTANE OP) Place 1 drop into both eyes 3 (three) times daily as needed (dry eyes).    [provider]  polyethylene glycol powder (GLYCOLAX/MIRALAX) 17 GM/SCOOP powder Take 17 g by mouth daily.    [provider]  predniSONE (DELTASONE) 5 MG tablet Take 5 mg by mouth 2 (two) times daily.    [provider]  Prucalopride  Succinate (MOTEGRITY) 2 MG TABS Take 1 tablet (2 mg total) by mouth daily. 07/10/23   Imogene Burn, MD  traMADol (ULTRAM) 50 MG tablet Take 1 tablet (50 mg total) by mouth every 6 (six) hours as needed. 03/02/23 03/01/24  Chevis Pretty III, MD  zinc gluconate 50 MG tablet Take 50 mg by mouth daily.    [provider]  Past Surgical History Past Surgical History:  Procedure Laterality Date   ABDOMINAL HYSTERECTOMY  2008   BILATERAL CARPAL TUNNEL RELEASE Bilateral    ECTOPIC PREGNANCY SURGERY  1989   EXCISION MELANOMA WITH SENTINEL LYMPH NODE BIOPSY Left 10/02/2016   Procedure: WIDE LOCAL EXCISION AND ADVANCEMENT FLAP CLOSURE LEFT UPPER  MELANOMA WITH SENTINEL LYMPH NODE MAPPING AND BIOPSY;  Surgeon: Almond Lint, MD;  Location: [facility] OR;  Service: General;  Laterality: Left;   EYE SURGERY     LAPAROSCOPIC CHOLECYSTECTOMY  ~ 01/2016   MELANOMA EXCISION WITH SENTINEL LYMPH NODE BIOPSY Left 10/02/2016   WIDE LOCAL EXCISION AND ADVANCEMENT FLAP CLOSURE LEFT UPPER  MELANOMA WITH SENTINEL LYMPH NODE MAPPING AND BIOPSY   OVARIAN CYST REMOVAL     SHOULDER ARTHROSCOPY W/ ROTATOR CUFF REPAIR Right 2014   "partial"   STRABISMUS SURGERY Left 1971   STRABISMUS SURGERY Left 03/18/2020   Procedure: STRABISMUS REPAIR;  Surgeon: Verne Carrow, MD;  Location:  SURGERY CENTER;  Service: Ophthalmology;  Laterality: Left;   THYROIDECTOMY N/A 03/01/2023   Procedure: TOTAL THYROIDECTOMY;  Surgeon: Darnell Level, MD;  Location: [facility] ORS;  Service: General;  Laterality: N/A;   TUMOR EXCISION  1984   from tail bone and part of tail bone removed   Family History Family History  Problem Relation Age of Onset   Multiple sclerosis Father    Colon cancer Neg Hx    Stomach cancer Neg Hx     Social History Social History   Tobacco Use   Smoking status: Former    Current  packs/day: 0.00    Average packs/day: 1.5 packs/day for 24.0 years (36.0 ttl pk-yrs)    Types: Cigarettes    Start date: 73    Quit date: 2008    Years since quitting: 16.6   Smokeless tobacco: Never  Vaping Use   Vaping status: Never Used  Substance Use Topics   Alcohol use: No    Comment: 10/02/2016 "sober since 03/14/2005"   Drug use: Not Currently    Types: Marijuana    Comment: 10/02/2016 " occasional; nothing since before 2006"   Allergies Doxycycline, Aspirin, Ciprofloxacin, Nsaids, Amoxicillin-pot clavulanate, Gluten meal, Lactose, Codeine, Hydrocodone-acetaminophen, Oxycodone, Sulfa antibiotics, and Sulfasalazine  Review of Systems Review of Systems  All other systems reviewed and are negative.   Physical Exam Vital Signs  I have reviewed the triage vital signs BP 135/77   Pulse 67   Temp 97.8 F (36.6 C) (Oral)   Resp 16   SpO2 97%  Physical Exam Vitals and nursing note reviewed.  Constitutional:      General: She is not in acute distress.    Appearance: She is well-developed.  HENT:     Head: Normocephalic and atraumatic.     Mouth/Throat:     Mouth: Mucous membranes are moist.  Eyes:     Pupils: Pupils are equal, round, and reactive to light.  Cardiovascular:     Rate and Rhythm: Normal rate and regular rhythm.     Heart sounds: No murmur heard. Pulmonary:     Effort: Pulmonary effort is normal. No respiratory distress.     Breath sounds: Normal breath sounds.  Abdominal:     General: Abdomen is flat.     Palpations: Abdomen is soft.     Tenderness: There is no abdominal tenderness.  Genitourinary:    Comments: Chaperoned by NT, brown stool in rectum Musculoskeletal:        General: No tenderness.  Right lower leg: No edema.     Left lower leg: No edema.  Skin:    General: Skin is warm and dry.  Neurological:     General: No focal deficit present.     Mental Status: She is alert. Mental status is at baseline.  Psychiatric:        Mood  and Affect: Mood normal.        Behavior: Behavior normal.     ED Results and Treatments Labs (all labs ordered are listed, but only abnormal results are displayed) Labs Reviewed  COMPREHENSIVE METABOLIC PANEL - Abnormal; Notable for the following components:      Result Value   Sodium 134 (*)    Glucose, Bld 162 (*)    AST 48 (*)    All other components within normal limits  CBC WITH DIFFERENTIAL/PLATELET  PROTIME-INR  POC OCCULT BLOOD, ED  TYPE AND SCREEN                                                                                                                          Radiology No results found.  Pertinent labs & imaging results that were available during my care of the patient were reviewed by me and considered in my medical decision making (see MDM for details).  Medications Ordered in ED Medications  ondansetron (ZOFRAN-ODT) disintegrating tablet 8 mg (8 mg Oral Not Given 07/15/23 1108)  alum & mag hydroxide-simeth (MAALOX/MYLANTA) 200-200-20 MG/5ML suspension 30 mL (has no administration in time range)                                                                                                                                     Procedures Procedures  (including critical care time)  Medical Decision Making / ED Course   MDM:  57 year old female presenting to the emergency department with concern for dark stools.  Patient overall well-appearing, physical exam no abdominal tenderness.  Rectal exam with brown stool.  Will send occult blood.  Patient is overall well-appearing, hemodynamically stable.  If patient has signs of blood loss or GI bleeding will discuss with GI.  Will check labs.  Will reassess.  She reports her chronic abdominal discomfort is similar to baseline over the past few months.  She does not have any tenderness on physical exam to suggest acute intra-abdominal process such as obstruction or perforation.    Clinical  Course as of 07/15/23  1130  Mon Jul 15, 2023  1127 Labs reassuring including negative occult blood, normal hemoglobin.  CMP also reassuring.  Discussed results with the patient.  Advise continued follow-up with GI.  Patient request medicine, recommended trial of Maalox.  Will give dose here in the emergency department prior to discharge. [WS]    Clinical Course User Index [WS] Lonell Grandchild, MD     Additional history obtained:  -External records from outside source obtained and reviewed including: Chart review including previous notes, labs, imaging, consultation notes including GI notes   Lab Tests: -I ordered, reviewed, and interpreted labs.   The pertinent results include:   Labs Reviewed  COMPREHENSIVE METABOLIC PANEL - Abnormal; Notable for the following components:      Result Value   Sodium 134 (*)    Glucose, Bld 162 (*)    AST 48 (*)    All other components within normal limits  CBC WITH DIFFERENTIAL/PLATELET  PROTIME-INR  POC OCCULT BLOOD, ED  TYPE AND SCREEN    Notable for minimal hyperglycemia. AST borderline elevated    Medicines ordered and prescription drug management: Meds ordered this encounter  Medications   ondansetron (ZOFRAN-ODT) disintegrating tablet 8 mg   alum & mag hydroxide-simeth (MAALOX/MYLANTA) 200-200-20 MG/5ML suspension 30 mL    -I have reviewed the patients home medicines and have made adjustments as needed    Social Determinants of Health:  Diagnosis or treatment significantly limited by social determinants of health: former smoker   Reevaluation: After the interventions noted above, I reevaluated the patient and found that their symptoms have improved  Co morbidities that complicate the patient evaluation  Past Medical History:  Diagnosis Date   Acid reflux    ADHD (attention deficit hyperactivity disorder)    Anemia    Arthritis    "elbows, hands, neck, shoulders" (10/02/2016)   Calcium blood increased    Chronic neck pain    Chronic UTI  (urinary tract infection)    "from self caths" (10/02/2016)   Hashimoto's disease    Hepatitis B    acute hepatitis B from lancet   History of alcoholism (HCC)    10/02/2016 "sober since 03/14/2005"   History of blood transfusion 1984; 1989; 2008   "quite a few"   History of shortness of breath    Hypercholesteremia    Hypothyroidism    no meds   Internal carotid aneurysm dx'd early 2017   Legally blind in left eye, as defined in [place]    Melanoma (HCC)    left upper arm   Migraines    "quite a few in a month; at least 15" (10/02/2016)   Neuropathy    neck; "not sure if it's from DM or Sjogren's" (10/02/2016)   Pneumonia 1986   "1 yr after major OR when I was under anesthesia for 13 hr"   Seizures (HCC) ~ 2004; ~ 2013   "seizures from diabetes "   Self-catheterizes urinary bladder    "since OR in 1984" (10/02/2016)   Sjogren's syndrome (HCC)    Thyroid goiter    "still present" (10/02/2016)   Type II diabetes mellitus (HCC)    Urinary retention    self caths   Wears glasses       Dispostion: Disposition decision including need for hospitalization was considered, and patient discharged from emergency department.    Final Clinical Impression(s) / ED Diagnoses Final diagnoses:  Dark stools  Epigastric pain  This chart was dictated using voice recognition software.  Despite best efforts to proofread,  errors can occur which can change the documentation meaning.    Lonell Grandchild, MD 07/15/23 1130

## 2023-07-15 NOTE — Discharge Instructions (Signed)
We evaluated you for your abdominal pain and dark stool.  Your stool was negative for bleeding and your hemoglobin (red blood cells) was normal.  Since you are still having symptoms despite taking your antiacid medications, I would recommend trying Mylanta.  You can buy this over-the-counter.  Please also continue to follow-up with GI.  If you do have any new symptoms, such as vomiting, severe uncontrolled pain, bloody stool, lightheadedness or dizziness, fainting, or any other new symptoms please return to the emergency department.

## 2023-07-17 ENCOUNTER — Ambulatory Visit: Payer: MEDICAID | Admitting: Physician Assistant

## 2023-07-23 ENCOUNTER — Telehealth: Payer: Self-pay | Admitting: Internal Medicine

## 2023-07-23 NOTE — Telephone Encounter (Signed)
Inbound call from patient stating that she is scheduled to have a endoscopy and colonoscopy tomorrow morning at 8:00 with Dr. Leonides Schanz. Patient stated that she takes a 5mg  steroid pill, half in the morning and half in the afternoon. Patient is requesting a call to discuss if she can still take the medication and if she can drink just a " sip" of milk with it. Please advise.

## 2023-07-23 NOTE — Telephone Encounter (Signed)
Returned the patient's phone call. Told the patient it was ok to take her steroid this afternoon with a sip of milk. Pt verbalized understanding.

## 2023-07-24 ENCOUNTER — Encounter: Payer: Self-pay | Admitting: Internal Medicine

## 2023-07-24 ENCOUNTER — Ambulatory Visit: Payer: MEDICAID | Admitting: Internal Medicine

## 2023-07-24 VITALS — BP 128/44 | HR 49 | Temp 99.1°F | Resp 17 | Ht 68.0 in | Wt 131.0 lb

## 2023-07-24 DIAGNOSIS — K2970 Gastritis, unspecified, without bleeding: Secondary | ICD-10-CM | POA: Diagnosis not present

## 2023-07-24 DIAGNOSIS — R12 Heartburn: Secondary | ICD-10-CM | POA: Diagnosis not present

## 2023-07-24 DIAGNOSIS — K219 Gastro-esophageal reflux disease without esophagitis: Secondary | ICD-10-CM

## 2023-07-24 DIAGNOSIS — D128 Benign neoplasm of rectum: Secondary | ICD-10-CM | POA: Diagnosis not present

## 2023-07-24 DIAGNOSIS — K449 Diaphragmatic hernia without obstruction or gangrene: Secondary | ICD-10-CM

## 2023-07-24 DIAGNOSIS — Z538 Procedure and treatment not carried out for other reasons: Secondary | ICD-10-CM

## 2023-07-24 DIAGNOSIS — K2950 Unspecified chronic gastritis without bleeding: Secondary | ICD-10-CM | POA: Diagnosis not present

## 2023-07-24 DIAGNOSIS — K5900 Constipation, unspecified: Secondary | ICD-10-CM

## 2023-07-24 DIAGNOSIS — Z1211 Encounter for screening for malignant neoplasm of colon: Secondary | ICD-10-CM

## 2023-07-24 MED ORDER — SODIUM CHLORIDE 0.9 % IV SOLN
500.0000 mL | Freq: Once | INTRAVENOUS | Status: DC
Start: 2023-07-24 — End: 2023-07-24

## 2023-07-24 NOTE — Progress Notes (Signed)
Called to room to assist during endoscopic procedure.  Patient ID and intended procedure confirmed with present staff. Received instructions for my participation in the procedure from the performing physician.  

## 2023-07-24 NOTE — Progress Notes (Signed)
GASTROENTEROLOGY PROCEDURE H&P NOTE   Primary Care Physician: Barnie Mort, PA-C    Reason for Procedure:   GERD, colon cancer screening  Plan:    EGD/colonoscopy  Patient is appropriate for endoscopic procedure(s) in the ambulatory ([facility]) setting.  The nature of the procedure, as well as the risks, benefits, and alternatives were carefully and thoroughly reviewed with the patient. Ample time for discussion and questions allowed. The patient understood, was satisfied, and agreed to proceed.     HPI: Mary Erickson is a 57 y.o. female who presents for EGD/colonoscopy for evaluation of GERD and colon cancer screening .  Patient was most recently seen in the Gastroenterology Clinic on 05/20/23.  No interval change in medical history since that appointment. Please refer to that note for full details regarding GI history and clinical presentation.   Past Medical History:  Diagnosis Date   Acid reflux    ADHD (attention deficit hyperactivity disorder)    Anemia    Arthritis    "elbows, hands, neck, shoulders" (10/02/2016)   Calcium blood increased    Chronic neck pain    Chronic UTI (urinary tract infection)    "from self caths" (10/02/2016)   Hashimoto's disease    Hepatitis B    acute hepatitis B from lancet   History of alcoholism (HCC)    10/02/2016 "sober since 03/14/2005"   History of blood transfusion 1984; 1989; 2008   "quite a few"   History of shortness of breath    Hypercholesteremia    Hypothyroidism    no meds   Internal carotid aneurysm dx'd early 2017   Legally blind in left eye, as defined in [place]    [facility])    left upper arm   Migraines    "quite a few in a month; at least 15" (10/02/2016)   Neuropathy    neck; "not sure if it's from DM or Sjogren's" (10/02/2016)   Pneumonia 1986   "1 yr after major OR when I was under anesthesia for 13 hr"   Seizures (HCC) ~ 2004; ~ 2013   "seizures from diabetes "   Self-catheterizes urinary bladder     "since OR in 1984" (10/02/2016)   Sjogren's syndrome (HCC)    Thyroid goiter    "still present" (10/02/2016)   Type II diabetes mellitus (HCC)    Urinary retention    self caths   Wears glasses     Past Surgical History:  Procedure Laterality Date   ABDOMINAL HYSTERECTOMY  2008   BILATERAL CARPAL TUNNEL RELEASE Bilateral    ECTOPIC PREGNANCY SURGERY  1989   EXCISION MELANOMA WITH SENTINEL LYMPH NODE BIOPSY Left 10/02/2016   Procedure: WIDE LOCAL EXCISION AND ADVANCEMENT FLAP CLOSURE LEFT UPPER  MELANOMA WITH SENTINEL LYMPH NODE MAPPING AND BIOPSY;  Surgeon: Almond Lint, MD;  Location: [facility] OR;  Service: General;  Laterality: Left;   EYE SURGERY     LAPAROSCOPIC CHOLECYSTECTOMY  ~ 01/2016   MELANOMA EXCISION WITH SENTINEL LYMPH NODE BIOPSY Left 10/02/2016   WIDE LOCAL EXCISION AND ADVANCEMENT FLAP CLOSURE LEFT UPPER  MELANOMA WITH SENTINEL LYMPH NODE MAPPING AND BIOPSY   OVARIAN CYST REMOVAL     SHOULDER ARTHROSCOPY W/ ROTATOR CUFF REPAIR Right 2014   "partial"   STRABISMUS SURGERY Left 1971   STRABISMUS SURGERY Left 03/18/2020   Procedure: STRABISMUS REPAIR;  Surgeon: Verne Carrow, MD;  Location: [facility];  Service: Ophthalmology;  Laterality: Left;   THYROIDECTOMY N/A 03/01/2023  Procedure: TOTAL THYROIDECTOMY;  Surgeon: Darnell Level, MD;  Location: [facility];  Service: General;  Laterality: N/A;   TUMOR EXCISION  1984   from tail bone and part of tail bone removed    Prior to Admission medications   Medication Sig Start Date End Date Taking? Authorizing Provider  acetaminophen (TYLENOL) 500 MG tablet Take 1,000 mg by mouth every 6 (six) hours as needed for moderate pain.   Yes [provider]  calcium carbonate (TUMS) 500 MG chewable tablet Chew 2 tablets (400 mg of elemental calcium total) by mouth 2 (two) times daily. 03/01/23  Yes Darnell Level, MD  Cholecalciferol (VITAMIN D3) 125 MCG (5000 UT) capsule Take 5,000 Units by mouth daily.   Yes [provider]  Cyanocobalamin (B-12) 3000 MCG CAPS Take 3,000 mcg by mouth daily.   Yes [provider]  diclofenac Sodium (VOLTAREN) 1 % GEL Apply 2 g topically 4 (four) times daily. Patient taking differently: Apply 2 g topically 4 (four) times daily as needed (pain). 12/17/19  Yes Darr, Gerilyn Pilgrim, PA-C  estrogens, conjugated, (PREMARIN) 0.45 MG tablet Take 0.45 mg by mouth daily.   Yes [provider]  Insulin Detemir (LEVEMIR FLEXTOUCH) 100 UNIT/ML Pen Inject 4 Units into the skin 2 (two) times daily. 09/27/17  Yes [provider]  Insulin lispro (HUMALOG JUNIOR KWIKPEN) 100 UNIT/ML Inject 1-7 Units into the skin as needed (high blood sugar).   Yes [provider]  levothyroxine (SYNTHROID) 88 MCG tablet Take 1 tablet (88 mcg total) by mouth daily before breakfast. 03/01/23  Yes Darnell Level, MD  Magnesium 400 MG CAPS Take 400 mg by mouth daily.   Yes [provider]  Melatonin 10-10 MG TBCR Take 10 mg by mouth at bedtime.   Yes [provider]  methylphenidate 18 MG PO CR tablet Take 18 mg by mouth 2 (two) times daily.   Yes [provider]  Multiple Vitamins-Minerals (MULTIVITAMINS THER. W/MINERALS) TABS Take 1 tablet by mouth daily.   Yes [provider]  omeprazole (PRILOSEC) 20 MG capsule Take 20 mg by mouth daily. 06/20/23  Yes [provider]  ondansetron (ZOFRAN ODT) 4 MG disintegrating tablet Take 1 tablet (4 mg total) by mouth every 6 (six) hours as needed for nausea or vomiting. 06/18/23  Yes Esterwood, Amy S, PA-C  Polyethyl Glycol-Propyl Glycol (SYSTANE OP) Place 1 drop into both eyes 3 (three) times daily as needed (dry eyes).   Yes [provider]  polyethylene glycol powder (GLYCOLAX/MIRALAX) 17 GM/SCOOP powder Take 17 g by mouth daily.   Yes [provider]  predniSONE (DELTASONE) 5 MG tablet Take 5 mg by mouth 2 (two) times daily.   Yes [provider]  benzocaine (ORAJEL) 10 %  mucosal gel Use as directed 1 Application in the mouth or throat as needed for mouth pain.    [provider]  famotidine (PEPCID) 20 MG tablet Take 20 mg by mouth 2 (two) times daily.    [provider]  fluconazole (DIFLUCAN) 150 MG tablet Take 150 mg by mouth See admin instructions. Take 150 mg every 3 days for 3 doses starting on the 15th of every month    [provider]  glucose blood (ACCU-CHEK GUIDE) test strip 1 each by Other route as needed. 06/05/23   [provider]  Ketotifen Fumarate ([employer] ALLERGY EYE DROPS OP) Place 1 drop into both eyes 2 (two) times daily.    [provider]  Prucalopride  Succinate (MOTEGRITY) 2 MG TABS Take 1 tablet (2 mg total) by mouth daily. Patient not taking: Reported on 07/24/2023 07/10/23   Imogene Burn, MD  traMADol (ULTRAM) 50 MG tablet Take 1 tablet (50 mg total) by mouth every 6 (six) hours as needed. Patient not taking: Reported on 07/24/2023 03/02/23 03/01/24  Chevis Pretty III, MD  zinc gluconate 50 MG tablet Take 50 mg by mouth daily.    [provider]    Current Outpatient Medications  Medication Sig Dispense Refill   acetaminophen (TYLENOL) 500 MG tablet Take 1,000 mg by mouth every 6 (six) hours as needed for moderate pain.     calcium carbonate (TUMS) 500 MG chewable tablet Chew 2 tablets (400 mg of elemental calcium total) by mouth 2 (two) times daily. 90 tablet 1   Cholecalciferol (VITAMIN D3) 125 MCG (5000 UT) capsule Take 5,000 Units by mouth daily.     Cyanocobalamin (B-12) 3000 MCG CAPS Take 3,000 mcg by mouth daily.     diclofenac Sodium (VOLTAREN) 1 % GEL Apply 2 g topically 4 (four) times daily. (Patient taking differently: Apply 2 g topically 4 (four) times daily as needed (pain).) 50 g 0   estrogens, conjugated, (PREMARIN) 0.45 MG tablet Take 0.45 mg by mouth daily.     Insulin Detemir (LEVEMIR FLEXTOUCH) 100 UNIT/ML Pen Inject 4 Units into the skin 2 (two) times daily.     Insulin  lispro (HUMALOG JUNIOR KWIKPEN) 100 UNIT/ML Inject 1-7 Units into the skin as needed (high blood sugar).     levothyroxine (SYNTHROID) 88 MCG tablet Take 1 tablet (88 mcg total) by mouth daily before breakfast. 30 tablet 3   Magnesium 400 MG CAPS Take 400 mg by mouth daily.     Melatonin 10-10 MG TBCR Take 10 mg by mouth at bedtime.     methylphenidate 18 MG PO CR tablet Take 18 mg by mouth 2 (two) times daily.     Multiple Vitamins-Minerals (MULTIVITAMINS THER. W/MINERALS) TABS Take 1 tablet by mouth daily.     omeprazole (PRILOSEC) 20 MG capsule Take 20 mg by mouth daily.     ondansetron (ZOFRAN ODT) 4 MG disintegrating tablet Take 1 tablet (4 mg total) by mouth every 6 (six) hours as needed for nausea or vomiting. 30 tablet 1   Polyethyl Glycol-Propyl Glycol (SYSTANE OP) Place 1 drop into both eyes 3 (three) times daily as needed (dry eyes).     polyethylene glycol powder (GLYCOLAX/MIRALAX) 17 GM/SCOOP powder Take 17 g by mouth daily.     predniSONE (DELTASONE) 5 MG tablet Take 5 mg by mouth 2 (two) times daily.     benzocaine (ORAJEL) 10 % mucosal gel Use as directed 1 Application in the mouth or throat as needed for mouth pain.     famotidine (PEPCID) 20 MG tablet Take 20 mg by mouth 2 (two) times daily.     fluconazole (DIFLUCAN) 150 MG tablet Take 150 mg by mouth See admin instructions. Take 150 mg every 3 days for 3 doses starting on the 15th of every month     glucose blood (ACCU-CHEK GUIDE) test strip 1 each by Other route as needed.     Ketotifen Fumarate ([employer] ALLERGY EYE DROPS OP) Place 1 drop into both eyes 2 (two) times daily.     Prucalopride Succinate (MOTEGRITY) 2 MG TABS Take 1 tablet (2 mg total) by mouth daily. (Patient not taking: Reported on 07/24/2023) 30 tablet 3   traMADol (ULTRAM) 50 MG tablet  Take 1 tablet (50 mg total) by mouth every 6 (six) hours as needed. (Patient not taking: Reported on 07/24/2023) 20 tablet 0   zinc gluconate 50 MG tablet Take 50 mg by mouth daily.      Current Facility-Administered Medications  Medication Dose Route Frequency Provider Last Rate Last Admin   0.9 %  sodium chloride infusion  500 mL Intravenous Once Imogene Burn, MD        Allergies as of 07/24/2023 - Review Complete 07/24/2023  Allergen Reaction Noted   Doxycycline Other (See Comments) 04/08/2014   Aspirin Other (See Comments) 02/29/2016   Ciprofloxacin Hives 04/06/2014   Nsaids Other (See Comments) 04/10/2016   Amoxicillin-pot clavulanate Nausea Only 12/30/2017   Gluten meal  02/21/2023   Lactose  02/21/2023   Codeine Nausea And Vomiting 01/18/2012   Hydrocodone-acetaminophen Nausea And Vomiting 01/31/2016   Oxycodone Nausea And Vomiting 01/31/2016   Sulfa antibiotics Nausea And Vomiting 01/18/2012   Sulfasalazine Nausea And Vomiting 01/18/2012    Family History  Problem Relation Age of Onset   Multiple sclerosis Father    Colon cancer Neg Hx    Stomach cancer Neg Hx    Colon polyps Neg Hx    Esophageal cancer Neg Hx    Rectal cancer Neg Hx     Social History   Socioeconomic History   Marital status: Divorced    Spouse name: Not on file   Number of children: Not on file   Years of education: Not on file   Highest education level: Not on file  Occupational History   Not on file  Tobacco Use   Smoking status: Former    Current packs/day: 0.00    Average packs/day: 1.5 packs/day for 24.0 years (36.0 ttl pk-yrs)    Types: Cigarettes    Start date: 38    Quit date: 2008    Years since quitting: 16.7   Smokeless tobacco: Never  Vaping Use   Vaping status: Never Used  Substance and Sexual Activity   Alcohol use: No    Comment: 10/02/2016 "sober since 03/14/2005"   Drug use: Not Currently    Types: Marijuana    Comment: 10/02/2016 " occasional; nothing since before 2006"   Sexual activity: Never  Other Topics Concern   Not on file  Social History Narrative   Not on file   Social Determinants of Health   Financial Resource Strain: Low  Risk  (01/23/2023)   Received from [facility], [facility]   Overall Financial Resource Strain (CARDIA)    Difficulty of Paying Living Expenses: Not hard at all  Food Insecurity: No Food Insecurity (03/01/2023)   Hunger Vital Sign    Worried About Running Out of Food in the Last Year: Never true    Ran Out of Food in the Last Year: Never true  Transportation Needs: No Transportation Needs (03/01/2023)   PRAPARE - Administrator, Civil Service (Medical): No    Lack of Transportation (Non-Medical): No  Physical Activity: Insufficiently Active (07/25/2022)   [facility]   Exercise Vital Sign    Days of Exercise per Week: 4 days    Minutes of Exercise per Session: 30 min  Stress: No Stress Concern Present (07/25/2022)   Received from [facility], [facility], [facility]   [facility] Occupational [facility] - Occupational Stress Questionnaire    Feeling of Stress : Only a little  Social Connections: Moderately Integrated (07/25/2022)  [facility] from [facility], [facility]   Social Network    How would you rate your social network (family, work, friends)?: Adequate participation with social networks  Intimate Partner Violence: Not At Risk (03/01/2023)   Humiliation, Afraid, Rape, and Kick questionnaire    Fear of Current or Ex-Partner: No    Emotionally Abused: No    Physically Abused: No    Sexually Abused: No    Physical Exam: Vital signs in last 24 hours: BP 121/60   Pulse (!) 55   Temp 99.1 F (37.3 C)   Ht 5\' 8"  (1.727 m)   Wt 131 lb (59.4 kg)   SpO2 97%   BMI 19.92 kg/m  GEN: NAD EYE: Sclerae anicteric ENT: MMM CV: Non-tachycardic Pulm: No increased WOB GI: Soft NEURO:  Alert & Oriented   Eulah Pont, MD [facility]   07/24/2023 8:19 AM

## 2023-07-24 NOTE — Progress Notes (Signed)
Vss nad trans to pacu 

## 2023-07-24 NOTE — Op Note (Signed)
[facility] Patient Name: Mary Erickson Procedure Date: 07/24/2023 7:18 AM MRN: [id number] Endoscopist: Particia Lather , , [id number] Age: 57 Referring MD:  Date of Birth: 1966/04/25 Gender: Female Account #: [id number] Procedure:                Colonoscopy Indications:              Screening for colorectal malignant neoplasm Medicines:                Monitored Anesthesia Care Procedure:                Pre-Anesthesia Assessment:                           - Prior to the procedure, a History and Physical                            was performed, and patient medications and                            allergies were reviewed. The patient's tolerance of                            previous anesthesia was also reviewed. The risks                            and benefits of the procedure and the sedation                            options and risks were discussed with the patient.                            All questions were answered, and informed consent                            was obtained. Prior Anticoagulants: The patient has                            taken no anticoagulant or antiplatelet agents. ASA                            Grade Assessment: II - A patient with mild systemic                            disease. After reviewing the risks and benefits,                            the patient was deemed in satisfactory condition to                            undergo the procedure.                           After obtaining informed consent, the colonoscope  was passed under direct vision. Throughout the                            procedure, the patient's blood pressure, pulse, and                            oxygen saturations were monitored continuously. The                            Olympus CF-HQ190L [id number] [id number]) Colonoscope was                            introduced through the anus with the intention of                            advancing  to the cecum. The scope was advanced to                            the ascending colon before the procedure was                            aborted. Medications were given. The colonoscopy                            was performed without difficulty. The patient                            tolerated the procedure well. The quality of the                            bowel preparation was inadequate. The rectum was                            photographed. Scope In: 8:34:29 AM Scope Out: 8:45:58 AM Total Procedure Duration: 0 hours 11 minutes 29 seconds  Findings:                 A large amount of stool was found in the rectum, in                            the sigmoid colon, in the descending colon, in the                            transverse colon and in the ascending colon,                            interfering with visualization.                           A 3 mm polyp was found in the rectum. The polyp was                            sessile. The polyp was removed with a cold snare.  Resection and retrieval were complete.                           Non-bleeding internal hemorrhoids were found during                            perianal exam. The hemorrhoids were Grade III                            (internal hemorrhoids that prolapse but require                            manual reduction). Complications:            No immediate complications. Estimated Blood Loss:     Estimated blood loss was minimal. Impression:               - Preparation of the colon was inadequate.                           - Stool in the rectum, in the sigmoid colon, in the                            descending colon, in the transverse colon and in                            the ascending colon.                           - One 3 mm polyp in the rectum, removed with a cold                            snare. Resected and retrieved.                           - Non-bleeding internal  hemorrhoids. Recommendation:           - Discharge patient to home (with escort).                           - Await pathology results.                           - Repeat colonoscopy tomorrow because the bowel                            preparation was suboptimal. Will plan for another                            bowel preparation tonight.                           - The findings and recommendations were discussed                            with the patient. Dr Particia Lather "[place] [place],  07/24/2023  9:00:43 AM

## 2023-07-24 NOTE — Patient Instructions (Signed)
Handouts provided about Hiatal Hernia, hemorrhoids, gastritis and polyps.  Await pathology results of endoscopy.  Return to GI clinic in 2-3 months. Repeat colonoscopy tomorrow scheduled at 0930 on July 25, 2023, because the bowel preparation was suboptimal.  Will plan for another bowel preparation tonight.  Preparation instructions printed and reviewed.  YOU HAD AN ENDOSCOPIC PROCEDURE TODAY AT THE [facility]:   Refer to the procedure report that was given to you for any specific questions about what was found during the examination.  If the procedure report does not answer your questions, please call your gastroenterologist to clarify.  If you requested that your care partner not be given the details of your procedure findings, then the procedure report has been included in a sealed envelope for you to review at your convenience later.  YOU SHOULD EXPECT: Some feelings of bloating in the abdomen. Passage of more gas than usual.  Walking can help get rid of the air that was put into your GI tract during the procedure and reduce the bloating. If you had a lower endoscopy (such as a colonoscopy or flexible sigmoidoscopy) you may notice spotting of blood in your stool or on the toilet paper. If you underwent a bowel prep for your procedure, you may not have a normal bowel movement for a few days.  Please Note:  You might notice some irritation and congestion in your nose or some drainage.  This is from the oxygen used during your procedure.  There is no need for concern and it should clear up in a day or so.  SYMPTOMS TO REPORT IMMEDIATELY:  Following lower endoscopy (colonoscopy or flexible sigmoidoscopy):  Excessive amounts of blood in the stool  Significant tenderness or worsening of abdominal pains  Swelling of the abdomen that is new, acute  Fever of 100F or higher  Following upper endoscopy (EGD)  Vomiting of blood or coffee ground material  New chest pain or pain under the  shoulder blades  Painful or persistently difficult swallowing  New shortness of breath  Fever of 100F or higher  Black, tarry-looking stools  For urgent or emergent issues, a gastroenterologist can be reached at any hour by calling [phone]. Do not use MyChart messaging for urgent concerns.    DIET:  We do recommend a small meal at first, but then you may proceed to your regular diet.  Drink plenty of fluids but you should avoid alcoholic beverages for 24 hours.  ACTIVITY:  You should plan to take it easy for the rest of today and you should NOT DRIVE or use heavy machinery until tomorrow (because of the sedation medicines used during the test).    FOLLOW UP: Our staff will call the number listed on your records the next business day following your procedure.  We will call around 7:15- 8:00 am to check on you and address any questions or concerns that you may have regarding the information given to you following your procedure. If we do not reach you, we will leave a message.     If any biopsies were taken you will be contacted by phone or by letter within the next 1-3 weeks.  Please call us at [phone] if you have not heard about the biopsies in 3 weeks.    SIGNATURES/CONFIDENTIALITY: You and/or your care partner have signed paperwork which will be entered into your electronic medical record.  These signatures attest to the fact that that the information above on your After Visit  Summary has been reviewed and is understood.  Full responsibility of the confidentiality of this discharge information lies with you and/or your care-partner.

## 2023-07-24 NOTE — Op Note (Signed)
[facility] Patient Name: Mary Erickson Procedure Date: 07/24/2023 7:35 AM MRN: [id number] Endoscopist: Particia Lather , , [id number] Age: 57 Referring MD:  Date of Birth: [birth year] Gender: Female Account #: [id number] Procedure:                Upper GI endoscopy Indications:              Heartburn Medicines:                Monitored Anesthesia Care Procedure:                Pre-Anesthesia Assessment:                           - Prior to the procedure, a History and Physical                            was performed, and patient medications and                            allergies were reviewed. The patient's tolerance of                            previous anesthesia was also reviewed. The risks                            and benefits of the procedure and the sedation                            options and risks were discussed with the patient.                            All questions were answered, and informed consent                            was obtained. Prior Anticoagulants: The patient has                            taken no anticoagulant or antiplatelet agents. ASA                            Grade Assessment: II - A patient with mild systemic                            disease. After reviewing the risks and benefits,                            the patient was deemed in satisfactory condition to                            undergo the procedure.                           After obtaining informed consent, the endoscope was  passed under direct vision. Throughout the                            procedure, the patient's blood pressure, pulse, and                            oxygen saturations were monitored continuously. The                            GIF [id number] #[id number] was introduced through the                            mouth, and advanced to the second part of duodenum.                            The upper GI endoscopy was  accomplished without                            difficulty. The patient tolerated the procedure                            well. Scope In: Scope Out: Findings:                 The examined esophagus was normal.                           A 2 cm hiatal hernia was present.                           Localized mild inflammation characterized by                            congestion (edema) and erythema was found in the                            gastric antrum. Biopsies were taken with a cold                            forceps for histology.                           The examined duodenum was normal. Complications:            No immediate complications. Estimated Blood Loss:     Estimated blood loss was minimal. Impression:               - Normal esophagus.                           - 2 cm hiatal hernia.                           - Gastritis. Biopsied.                           - Normal examined duodenum. Recommendation:           -  Await pathology results.                           - Return to GI clinic in 2-3 months.                           - Perform a colonoscopy today. Dr Particia Lather "Alan Ripper" Leonides Schanz,  07/24/2023 8:55:36 AM

## 2023-07-25 ENCOUNTER — Telehealth: Payer: Self-pay

## 2023-07-25 ENCOUNTER — Encounter: Payer: MEDICAID | Admitting: Gastroenterology

## 2023-07-25 NOTE — Telephone Encounter (Signed)
Left message on follow up call. 

## 2023-07-26 LAB — SURGICAL PATHOLOGY

## 2023-08-01 ENCOUNTER — Encounter: Payer: Self-pay | Admitting: Internal Medicine

## 2023-08-02 ENCOUNTER — Telehealth: Payer: Self-pay | Admitting: Physician Assistant

## 2023-08-02 NOTE — Telephone Encounter (Signed)
PT had an EGD/colon 9/11 and ever since has been sick. She would like to discuss her symptoms as well as if she can be put on motegrity. Please advise.

## 2023-08-02 NOTE — Telephone Encounter (Signed)
Spoke with the patient. She said she was very sick and was unable to do the colonoscopy as planned. She believes she became too dehydrated. Her plan is to "get stronger" and reschedule. She reports "terrible nausea and weakness, headache." Discussed Motegrity. She is considering again that she may try it. She reports she threw away the prescription she had. No samples available. She will wait until next week when she would be able to refill it if she still wants to try Motegrity. Remaining on Linzess. Feels she has made some progress in having more consistent bowel movements.

## 2023-10-15 ENCOUNTER — Telehealth: Payer: Self-pay | Admitting: Internal Medicine

## 2023-10-15 NOTE — Telephone Encounter (Signed)
Patient requesting 3 month prescription for Murelax. Please advise.

## 2023-10-16 NOTE — Telephone Encounter (Signed)
Called and left patient a message that Miralax can be purchased over the counter if that is the medication she is referring to. If not to call us back

## 2023-11-14 ENCOUNTER — Ambulatory Visit (INDEPENDENT_AMBULATORY_CARE_PROVIDER_SITE_OTHER): Payer: MEDICAID | Admitting: Internal Medicine

## 2023-11-14 VITALS — BP 120/64 | HR 66 | Ht 68.0 in | Wt 131.2 lb

## 2023-11-14 DIAGNOSIS — K5900 Constipation, unspecified: Secondary | ICD-10-CM

## 2023-11-14 DIAGNOSIS — K219 Gastro-esophageal reflux disease without esophagitis: Secondary | ICD-10-CM | POA: Diagnosis not present

## 2023-11-14 DIAGNOSIS — Z1211 Encounter for screening for malignant neoplasm of colon: Secondary | ICD-10-CM

## 2023-11-14 MED ORDER — LINACLOTIDE 145 MCG PO CAPS: 145.0000 ug | ORAL_CAPSULE | Freq: Every day | ORAL | 3 refills | Status: DC

## 2023-11-14 MED ORDER — OMEPRAZOLE 20 MG PO CPDR: 20.0000 mg | DELAYED_RELEASE_CAPSULE | Freq: Two times a day (BID) | ORAL | 1 refills | Status: DC

## 2023-11-14 NOTE — Patient Instructions (Signed)
 It has been recommended to you by your physician that you have a(n) Colonoscopy completed. Per your request, we did not schedule the procedure(s) today. Please contact our office at [phone] should you decide to have the procedure completed. You will be scheduled for a pre-visit and procedure at that time.  We have sent the following medications to your pharmacy for you to pick up at your convenience: Omeprazole , Linzess    _______________________________________________________  If your blood pressure at your visit was 140/90 or greater, please contact your primary care physician to follow up on this.  _______________________________________________________  If you are age 33 or older, your body mass index should be between 23-30. Your Body mass index is 19.96 kg/m. If this is out of the aforementioned range listed, please consider follow up with your Primary Care Provider.  If you are age 29 or younger, your body mass index should be between 19-25. Your Body mass index is 19.96 kg/m. If this is out of the aformentioned range listed, please consider follow up with your Primary Care Provider.   ________________________________________________________  The [facility] providers would like to encourage you to use MYCHART to communicate with providers for non-urgent requests or questions.  Due to long hold times on the telephone, sending your provider a message by [facility] may be a faster and more efficient way to get a response.  Please allow 48 business hours for a response.  Please remember that this is for non-urgent requests.  _______________________________________________________  Thank you for entrusting me with your care and for choosing [facility], Dr. Estefana Kidney

## 2023-11-14 NOTE — Progress Notes (Signed)
 Chief Complaint: Constipation, GERD  HPI : 58 year old female with history of Sjogren's syndrome, rheumatoid arthritis, hypothyroidism, T1DM, seizures, migraines, prior SIBO, and ADHD presents for follow up of constipation and GERD  She has had constipation since she was a teenager. She had had colonoscopies in the past when she was not adequately cleaned out. Colonoscopy in 2014 that was done in the hospital was normal. She had a benign tumor removed via a large surgery when she was 17. She has had to self-cath since she was 58 years old as a result of this surgery. Denies family history of colon cancer. Patient has never had colon polyps.   Interval History: Her constipation is well controlled currently. She is taking Linzess  and Miralax  PRN.  Denies having to use any enemas for quite some time.  Previously she was told that she had a colonic ulcer 32 years ago. She was hospitalized for ulcerative colitis flare in the past. She thinks that her steroids are keeping any potential ulcerative colitis symptoms under control. She thinks that steroids are the reason why she had a normal colonoscopy in 2014. She follows with a rheumatologist at [facility] and is currently on steroids for RA. Usually she has flares of acid reflux while on steroids, but her omeprazole  seems to be doing a good job of keeping her acid reflux under control. Denies ab pain. She is not putting on weight but she is not losing weight currently either.  Wt Readings from Last 3 Encounters:  11/14/23 131 lb 4 oz (59.5 kg)  07/24/23 131 lb (59.4 kg)  06/18/23 131 lb (59.4 kg)   Past Medical History:  Diagnosis Date   Acid reflux    ADHD (attention deficit hyperactivity disorder)    Anemia    Arthritis    elbows, hands, neck, shoulders (10/02/2016)   Calcium  blood increased    Chronic neck pain    Chronic UTI (urinary tract infection)    from self caths (10/02/2016)   Hashimoto's disease    Hepatitis B    acute  hepatitis B from lancet   History of alcoholism (HCC)    10/02/2016 sober since 03/14/2005   History of blood transfusion 1984; 1989; 2008   quite a few   History of shortness of breath    Hypercholesteremia    Hypothyroidism    no meds   Internal carotid aneurysm dx'd early 2017   Legally blind in left eye, as defined in [place]     Melanoma (HCC)    left upper arm   Migraines    quite a few in a month; at least 15 (10/02/2016)   Neuropathy    neck; not sure if it's from DM or Sjogren's (10/02/2016)   Pneumonia 1986   1 yr after major OR when I was under anesthesia for 13 hr   Seizures (HCC) ~ 2004; ~ 2013   seizures from diabetes    Self-catheterizes urinary bladder    since OR in 1984 (10/02/2016)   Sjogren's syndrome (HCC)    Thyroid  goiter    still present (10/02/2016)   Type II diabetes mellitus (HCC)    Urinary retention    self caths   Wears glasses      Past Surgical History:  Procedure Laterality Date   ABDOMINAL HYSTERECTOMY  2008   BILATERAL CARPAL TUNNEL RELEASE Bilateral    ECTOPIC PREGNANCY SURGERY  1989   EXCISION MELANOMA WITH SENTINEL LYMPH NODE BIOPSY Left 10/02/2016   Procedure: WIDE  LOCAL EXCISION AND ADVANCEMENT FLAP CLOSURE LEFT UPPER  MELANOMA WITH SENTINEL LYMPH NODE MAPPING AND BIOPSY;  Surgeon: Jina Nephew, MD;  Location: [facility] OR;  Service: General;  Laterality: Left;   EYE SURGERY     LAPAROSCOPIC CHOLECYSTECTOMY  ~ 01/2016   MELANOMA EXCISION WITH SENTINEL LYMPH NODE BIOPSY Left 10/02/2016   WIDE LOCAL EXCISION AND ADVANCEMENT FLAP CLOSURE LEFT UPPER  MELANOMA WITH SENTINEL LYMPH NODE MAPPING AND BIOPSY   OVARIAN CYST REMOVAL     SHOULDER ARTHROSCOPY W/ ROTATOR CUFF REPAIR Right 2014   partial   STRABISMUS SURGERY Left 1971   STRABISMUS SURGERY Left 03/18/2020   Procedure: STRABISMUS REPAIR;  Surgeon: Neysa Fallow, MD;  Location: [facility];  Service: Ophthalmology;  Laterality: Left;   THYROIDECTOMY N/A  03/01/2023   Procedure: TOTAL THYROIDECTOMY;  Surgeon: Eletha Boas, MD;  Location: [facility];  Service: General;  Laterality: N/A;   TUMOR EXCISION  1984   from tail bone and part of tail bone removed   Family History  Problem Relation Age of Onset   Multiple sclerosis Father    Colon cancer Neg Hx    Stomach cancer Neg Hx    Colon polyps Neg Hx    Esophageal cancer Neg Hx    Rectal cancer Neg Hx    Social History   Tobacco Use   Smoking status: Former    Current packs/day: 0.00    Average packs/day: 1.5 packs/day for 24.0 years (36.0 ttl pk-yrs)    Types: Cigarettes    Start date: 75    Quit date: 2008    Years since quitting: 17.0   Smokeless tobacco: Never  Vaping Use   Vaping status: Never Used  Substance Use Topics   Alcohol use: No    Comment: 10/02/2016 sober since 03/14/2005   Drug use: Not Currently    Types: Marijuana    Comment: 10/02/2016  occasional; nothing since before 2006   Current Outpatient Medications  Medication Sig Dispense Refill   acetaminophen  (TYLENOL ) 500 MG tablet Take 1,000 mg by mouth every 6 (six) hours as needed for moderate pain.     aluminum-magnesium  hydroxide 200-200 MG/5ML suspension Take by mouth every 6 (six) hours as needed for indigestion.     atenolol (TENORMIN) 25 MG tablet Take 12.5 mg by mouth as needed.     benzocaine  (ORAJEL) 10 % mucosal gel Use as directed 1 Application in the mouth or throat as needed for mouth pain.     Cholecalciferol  (VITAMIN D3) 125 MCG (5000 UT) capsule Take 5,000 Units by mouth daily.     Cyanocobalamin  (B-12) 3000 MCG CAPS Take 3,000 mcg by mouth daily.     diclofenac  Sodium (VOLTAREN ) 1 % GEL Apply 2 g topically 4 (four) times daily. (Patient taking differently: Apply 2 g topically 4 (four) times daily as needed (pain).) 50 g 0   fluconazole  (DIFLUCAN ) 150 MG tablet Take 150 mg by mouth See admin instructions. Take 150 mg every 3 days for 3 doses starting on the 15th of every month     glucose  blood (ACCU-CHEK GUIDE) test strip 1 each by Other route as needed.     Insulin  Detemir (LEVEMIR  FLEXTOUCH) 100 UNIT/ML Pen Inject 4 Units into the skin 2 (two) times daily.     Insulin  lispro (HUMALOG JUNIOR KWIKPEN) 100 UNIT/ML Inject 1-7 Units into the skin as needed (high blood sugar).     Ketotifen  Fumarate ([employer] ALLERGY EYE DROPS OP) Place 1 drop into  both eyes 2 (two) times daily.     levothyroxine  (SYNTHROID ) 112 MCG tablet Take 112 mcg by mouth daily before breakfast.     Magnesium  400 MG CAPS Take 400 mg by mouth daily.     Melatonin 10-10 MG TBCR Take 10 mg by mouth at bedtime.     methylphenidate  18 MG PO CR tablet Take 18 mg by mouth 2 (two) times daily.     Multiple Vitamins-Minerals (MULTIVITAMINS THER. W/MINERALS) TABS Take 1 tablet by mouth daily.     omeprazole  (PRILOSEC) 20 MG capsule Take 20 mg by mouth 2 (two) times daily before a meal.     ondansetron  (ZOFRAN  ODT) 4 MG disintegrating tablet Take 1 tablet (4 mg total) by mouth every 6 (six) hours as needed for nausea or vomiting. 30 tablet 1   Polyethyl Glycol-Propyl Glycol (SYSTANE OP) Place 1 drop into both eyes 3 (three) times daily as needed (dry eyes).     polyethylene glycol powder (GLYCOLAX /MIRALAX ) 17 GM/SCOOP powder Take 17 g by mouth daily.     predniSONE  (DELTASONE ) 5 MG tablet Take 5 mg by mouth 2 (two) times daily.     zinc  gluconate 50 MG tablet Take 50 mg by mouth daily.     methylphenidate  36 MG PO CR tablet Take 36 mg by mouth every morning.     traMADol  (ULTRAM ) 50 MG tablet Take 1 tablet (50 mg total) by mouth every 6 (six) hours as needed. (Patient not taking: Reported on 11/14/2023) 20 tablet 0   No current facility-administered medications for this visit.   Allergies  Allergen Reactions   Doxycycline Other (See Comments)    Elevates liver enzymes    Aspirin  Other (See Comments)    Can take 81 mg but has to be coated; No other Aspirin  due to bleeding in stools from ulcers   Ciprofloxacin  Hives    Per  pt, she can tolerate oral but not IV   Nsaids Other (See Comments)    GI issues    Amoxicillin -Pot Clavulanate Nausea Only   Gluten Meal     Avoid due to autoimmune issues    Lactose     Avoid due to autoimmune issues    Codeine Nausea And Vomiting   Hydrocodone -Acetaminophen  Nausea And Vomiting   Oxycodone  Nausea And Vomiting   Sulfa Antibiotics Nausea And Vomiting   Sulfasalazine Nausea And Vomiting   Physical Exam: BP 120/64   Pulse 66   Ht 5' 8 (1.727 m)   Wt 131 lb 4 oz (59.5 kg)   SpO2 100%   BMI 19.96 kg/m  Constitutional: Pleasant,well-developed, female in no acute distress. HEENT: Normocephalic and atraumatic. Conjunctivae are normal. No scleral icterus. Cardiovascular: Normal rate, regular rhythm.  Pulmonary/chest: Effort normal and breath sounds normal. No wheezing, rales or rhonchi. Abdominal: Soft, nondistended, nontender. Bowel sounds quiet. There are no masses palpable. No hepatomegaly. Extremities: No edema Neurological: Alert and oriented to person place and time. Skin: Skin is warm and dry. No rashes noted. Psychiatric: Normal mood and affect. Behavior is normal.  Labs 02/2023: CBC with elevated WBC of 10.9. CMP unremarkable. Lipase nml.   Labs 07/2023: CBC normal.  CMP with mildly low sodium of 134 and mildly elevated AST of 48.  INR was normal.  Fecal occult blood test was negative.  CT A/P w/contrast 03/07/23: IMPRESSION: 1. Large amount of retained stool again seen throughout the colon, consistent with chronic constipation. No bowel obstruction or ileus. 2. No acute intra-abdominal or intrapelvic process.  3.  Aortic Atherosclerosis (ICD10-I70.0).  KUB 06/18/23: IMPRESSION: 1. Continued fecal retention consistent with constipation. No bowel obstruction or ileus.  EGD 07/24/23: - Normal esophagus. - 2 cm hiatal hernia. - Gastritis. Biopsied. - Normal examined duodenum. Path: 1. Surgical [P], gastric :       ANTRAL AND OXYNTIC MUCOSA WITH SLIGHT  CHRONIC INFLAMMATION.       NEGATIVE FOR HELICOBACTER PYLORI.   Colonoscopy 07/24/23:- Preparation of the colon was inadequate. - Stool in the rectum, in the sigmoid colon, in the descending colon, in the transverse colon and in the ascending colon. - One 3 mm polyp in the rectum, removed with a cold snare. Resected and retrieved. - Non- bleeding internal hemorrhoids. Path: 2. Surgical [P], colon, rectum, polyp (1) :       PLANT MATERIAL CONSISTENT WITH FOOD MATTER.       NO COLONIC MUCOSAL TISSUE OR MALIGNANCY.    ASSESSMENT AND PLAN: Colon cancer screening Constipation GERD Patient overall is doing better in terms of her constipation and GERD.  Her constipation seems to be under good control on MiraLAX  and Linzess  as needed.  She is no longer needed enemas to help with her constipation.  We attempted to have her do a colonoscopy in 07/2023 but unfortunately her prep was inadequate.  Will attempt to get her rescheduled in the future for colonoscopy with a 2-day prep.  Interestingly patient does note to me that she has a history of having a colonic ulcer in the past and may have potential ulcerative colitis.  She states that usually oral steroids (to treat her RA) have kept her potential ulcerative colitis under good control.  During her next colonoscopy will definitely look closely for any signs of potential ulcerative colitis.  I did notify the patient that there are several other explanations for ulcers in the colon such as infection, ischemia, or medication usage.  GERD seems well-controlled on omeprazole  currently. - Continue omeprazole  20 mg BID. Refill - Cont Linzess  145 mcg every day PRN - Continue Miralax  PRN - Patient will call us  to schedule a colonoscopy [facility] with 2-day prep and 3 day CLD. Take Linzess  and Miralax  more regularly before colonoscopy  Estefana Kidney, MD  I spent 32 minutes of time, including in depth chart review, independent review of results as outlined above,  communicating results with the patient directly, face-to-face time with the patient, coordinating care, ordering studies and medications as appropriate, and documentation.

## 2023-12-08 ENCOUNTER — Other Ambulatory Visit: Payer: Self-pay

## 2023-12-08 ENCOUNTER — Inpatient Hospital Stay (HOSPITAL_COMMUNITY)
Admission: EM | Admit: 2023-12-08 | Discharge: 2023-12-12 | DRG: 181 | Disposition: A | Discharge status: Home / Self Care | Payer: Medicaid Other | Attending: Internal Medicine | Admitting: Internal Medicine

## 2023-12-08 ENCOUNTER — Encounter (HOSPITAL_COMMUNITY): Payer: Self-pay | Admitting: Internal Medicine

## 2023-12-08 ENCOUNTER — Emergency Department (HOSPITAL_COMMUNITY): Payer: Medicaid Other

## 2023-12-08 DIAGNOSIS — R042 Hemoptysis: Secondary | ICD-10-CM | POA: Diagnosis present

## 2023-12-08 DIAGNOSIS — Z888 Allergy status to other drugs, medicaments and biological substances status: Secondary | ICD-10-CM

## 2023-12-08 DIAGNOSIS — Z7189 Other specified counseling: Secondary | ICD-10-CM | POA: Diagnosis not present

## 2023-12-08 DIAGNOSIS — E7800 Pure hypercholesterolemia, unspecified: Secondary | ICD-10-CM | POA: Diagnosis present

## 2023-12-08 DIAGNOSIS — E1069 Type 1 diabetes mellitus with other specified complication: Secondary | ICD-10-CM | POA: Diagnosis not present

## 2023-12-08 DIAGNOSIS — Z79890 Hormone replacement therapy (postmenopausal): Secondary | ICD-10-CM

## 2023-12-08 DIAGNOSIS — Z794 Long term (current) use of insulin: Secondary | ICD-10-CM | POA: Diagnosis not present

## 2023-12-08 DIAGNOSIS — R918 Other nonspecific abnormal finding of lung field: Principal | ICD-10-CM

## 2023-12-08 DIAGNOSIS — M069 Rheumatoid arthritis, unspecified: Secondary | ICD-10-CM | POA: Diagnosis present

## 2023-12-08 DIAGNOSIS — G40909 Epilepsy, unspecified, not intractable, without status epilepticus: Secondary | ICD-10-CM | POA: Diagnosis present

## 2023-12-08 DIAGNOSIS — Z8619 Personal history of other infectious and parasitic diseases: Secondary | ICD-10-CM

## 2023-12-08 DIAGNOSIS — Z85820 Personal history of malignant melanoma of skin: Secondary | ICD-10-CM

## 2023-12-08 DIAGNOSIS — H548 Legal blindness, as defined in USA: Secondary | ICD-10-CM | POA: Diagnosis present

## 2023-12-08 DIAGNOSIS — Z886 Allergy status to analgesic agent status: Secondary | ICD-10-CM

## 2023-12-08 DIAGNOSIS — K638219 Small intestinal bacterial overgrowth, unspecified: Secondary | ICD-10-CM

## 2023-12-08 DIAGNOSIS — Z515 Encounter for palliative care: Principal | ICD-10-CM

## 2023-12-08 DIAGNOSIS — M3500 Sicca syndrome, unspecified: Secondary | ICD-10-CM | POA: Diagnosis not present

## 2023-12-08 DIAGNOSIS — Z66 Do not resuscitate: Secondary | ICD-10-CM | POA: Diagnosis present

## 2023-12-08 DIAGNOSIS — Z90710 Acquired absence of both cervix and uterus: Secondary | ICD-10-CM | POA: Diagnosis not present

## 2023-12-08 DIAGNOSIS — J439 Emphysema, unspecified: Secondary | ICD-10-CM | POA: Diagnosis present

## 2023-12-08 DIAGNOSIS — F909 Attention-deficit hyperactivity disorder, unspecified type: Secondary | ICD-10-CM | POA: Diagnosis present

## 2023-12-08 DIAGNOSIS — D630 Anemia in neoplastic disease: Secondary | ICD-10-CM | POA: Diagnosis present

## 2023-12-08 DIAGNOSIS — C3431 Malignant neoplasm of lower lobe, right bronchus or lung: Secondary | ICD-10-CM | POA: Diagnosis present

## 2023-12-08 DIAGNOSIS — Z881 Allergy status to other antibiotic agents status: Secondary | ICD-10-CM

## 2023-12-08 DIAGNOSIS — Z79899 Other long term (current) drug therapy: Secondary | ICD-10-CM

## 2023-12-08 DIAGNOSIS — Z7952 Long term (current) use of systemic steroids: Secondary | ICD-10-CM

## 2023-12-08 DIAGNOSIS — Y92239 Unspecified place in hospital as the place of occurrence of the external cause: Secondary | ICD-10-CM | POA: Diagnosis not present

## 2023-12-08 DIAGNOSIS — T383X5A Adverse effect of insulin and oral hypoglycemic [antidiabetic] drugs, initial encounter: Secondary | ICD-10-CM | POA: Diagnosis not present

## 2023-12-08 DIAGNOSIS — K219 Gastro-esophageal reflux disease without esophagitis: Secondary | ICD-10-CM | POA: Diagnosis present

## 2023-12-08 DIAGNOSIS — K5909 Other constipation: Secondary | ICD-10-CM | POA: Diagnosis present

## 2023-12-08 DIAGNOSIS — Z1152 Encounter for screening for COVID-19: Secondary | ICD-10-CM | POA: Diagnosis not present

## 2023-12-08 DIAGNOSIS — G43909 Migraine, unspecified, not intractable, without status migrainosus: Secondary | ICD-10-CM | POA: Diagnosis present

## 2023-12-08 DIAGNOSIS — J189 Pneumonia, unspecified organism: Secondary | ICD-10-CM | POA: Diagnosis not present

## 2023-12-08 DIAGNOSIS — E109 Type 1 diabetes mellitus without complications: Secondary | ICD-10-CM | POA: Diagnosis present

## 2023-12-08 DIAGNOSIS — E10649 Type 1 diabetes mellitus with hypoglycemia without coma: Secondary | ICD-10-CM | POA: Diagnosis not present

## 2023-12-08 DIAGNOSIS — K5900 Constipation, unspecified: Secondary | ICD-10-CM

## 2023-12-08 DIAGNOSIS — Z87898 Personal history of other specified conditions: Secondary | ICD-10-CM

## 2023-12-08 DIAGNOSIS — Z87891 Personal history of nicotine dependence: Secondary | ICD-10-CM

## 2023-12-08 DIAGNOSIS — Z882 Allergy status to sulfonamides status: Secondary | ICD-10-CM

## 2023-12-08 DIAGNOSIS — Z9049 Acquired absence of other specified parts of digestive tract: Secondary | ICD-10-CM

## 2023-12-08 DIAGNOSIS — Z885 Allergy status to narcotic agent status: Secondary | ICD-10-CM

## 2023-12-08 DIAGNOSIS — Z820 Family history of epilepsy and other diseases of the nervous system: Secondary | ICD-10-CM

## 2023-12-08 LAB — CBC WITH DIFFERENTIAL/PLATELET
Abs Immature Granulocytes: 0.07 10*3/uL (ref 0.00–0.07)
Basophils Absolute: 0.1 10*3/uL (ref 0.0–0.1)
Basophils Relative: 1 %
Eosinophils Absolute: 0.1 10*3/uL (ref 0.0–0.5)
Eosinophils Relative: 1 %
HCT: 39.2 % (ref 36.0–46.0)
Hemoglobin: 12.6 g/dL (ref 12.0–15.0)
Immature Granulocytes: 1 %
Lymphocytes Relative: 14 %
Lymphs Abs: 1.6 10*3/uL (ref 0.7–4.0)
MCH: 29.2 pg (ref 26.0–34.0)
MCHC: 32.1 g/dL (ref 30.0–36.0)
MCV: 91 fL (ref 80.0–100.0)
Monocytes Absolute: 0.5 10*3/uL (ref 0.1–1.0)
Monocytes Relative: 4 %
Neutro Abs: 9.7 10*3/uL — ABNORMAL HIGH (ref 1.7–7.7)
Neutrophils Relative %: 79 %
Platelets: 422 10*3/uL — ABNORMAL HIGH (ref 150–400)
RBC: 4.31 MIL/uL (ref 3.87–5.11)
RDW: 14.1 % (ref 11.5–15.5)
WBC: 12.2 10*3/uL — ABNORMAL HIGH (ref 4.0–10.5)
nRBC: 0 % (ref 0.0–0.2)

## 2023-12-08 LAB — HEMOGLOBIN A1C
Hgb A1c MFr Bld: 6.9 % — ABNORMAL HIGH (ref 4.8–5.6)
Mean Plasma Glucose: 151.33 mg/dL

## 2023-12-08 LAB — COMPREHENSIVE METABOLIC PANEL
ALT: 20 U/L (ref 0–44)
AST: 31 U/L (ref 15–41)
Albumin: 4.2 g/dL (ref 3.5–5.0)
Alkaline Phosphatase: 141 U/L — ABNORMAL HIGH (ref 38–126)
Anion gap: 9 (ref 5–15)
BUN: 13 mg/dL (ref 6–20)
CO2: 26 mmol/L (ref 22–32)
Calcium: 9.9 mg/dL (ref 8.9–10.3)
Chloride: 100 mmol/L (ref 98–111)
Creatinine, Ser: 0.54 mg/dL (ref 0.44–1.00)
GFR, Estimated: >60 mL/min (ref >60)
Glucose, Bld: 154 mg/dL — ABNORMAL HIGH (ref 70–99)
Potassium: 4.2 mmol/L (ref 3.5–5.1)
Sodium: 135 mmol/L (ref 135–145)
Total Bilirubin: 0.6 mg/dL (ref 0.0–1.2)
Total Protein: 9 g/dL — ABNORMAL HIGH (ref 6.5–8.1)

## 2023-12-08 LAB — PROTIME-INR
INR: 0.9 (ref 0.8–1.2)
Prothrombin Time: 12.7 s (ref 11.4–15.2)

## 2023-12-08 LAB — HEMOGLOBIN AND HEMATOCRIT, BLOOD
HCT: 37.4 % (ref 36.0–46.0)
Hemoglobin: 11.8 g/dL — ABNORMAL LOW (ref 12.0–15.0)

## 2023-12-08 LAB — GLUCOSE, CAPILLARY
Glucose-Capillary: 126 mg/dL — ABNORMAL HIGH (ref 70–99)
Glucose-Capillary: 181 mg/dL — ABNORMAL HIGH (ref 70–99)

## 2023-12-08 LAB — RESP PANEL BY RT-PCR (RSV, FLU A&B, COVID)  RVPGX2
Influenza A by PCR: NEGATIVE
Influenza B by PCR: NEGATIVE
Resp Syncytial Virus by PCR: NEGATIVE
SARS Coronavirus 2 by RT PCR: NEGATIVE

## 2023-12-08 LAB — LIPASE, BLOOD: Lipase: 27 U/L (ref 11–51)

## 2023-12-08 LAB — C-REACTIVE PROTEIN: CRP: 1.3 mg/dL — ABNORMAL HIGH (ref <1.0)

## 2023-12-08 LAB — SEDIMENTATION RATE: Sed Rate: 58 mm/h — ABNORMAL HIGH (ref 0–22)

## 2023-12-08 LAB — I-STAT CG4 LACTIC ACID, ED: Lactic Acid, Venous: 0.8 mmol/L (ref 0.5–1.9)

## 2023-12-08 MED ORDER — MELATONIN 10-10 MG PO TBCR: 10.0000 mg | EXTENDED_RELEASE_TABLET | Freq: Every day | ORAL | Status: DC

## 2023-12-08 MED ORDER — BENZOCAINE 10 % MT GEL: 1.0000 | OROMUCOSAL | Status: DC | PRN

## 2023-12-08 MED ORDER — INSULIN DETEMIR 100 UNIT/ML FLEXPEN: 4.0000 [IU] | PEN_INJECTOR | Freq: Two times a day (BID) | SUBCUTANEOUS | Status: DC

## 2023-12-08 MED ORDER — ACETAMINOPHEN 500 MG PO TABS
1000.0000 mg | ORAL_TABLET | Freq: Four times a day (QID) | ORAL | Status: DC | PRN
  Administered 2023-12-08 – 2023-12-10 (×3): 1000 mg via ORAL
  Filled 2023-12-08 (×3): qty 2

## 2023-12-08 MED ORDER — IOHEXOL 350 MG/ML SOLN
75.0000 mL | Freq: Once | INTRAVENOUS | Status: AC | PRN
  Administered 2023-12-08: 75 mL via INTRAVENOUS

## 2023-12-08 MED ORDER — VANCOMYCIN HCL 1250 MG/250ML IV SOLN
1250.0000 mg | Freq: Once | INTRAVENOUS | Status: DC
  Filled 2023-12-08: qty 250

## 2023-12-08 MED ORDER — MAGNESIUM 400 MG PO CAPS: 400.0000 mg | ORAL_CAPSULE | Freq: Every day | ORAL | Status: DC

## 2023-12-08 MED ORDER — INSULIN ASPART 100 UNIT/ML IJ SOLN
0.0000 [IU] | INTRAMUSCULAR | Status: DC
Start: 2023-12-08 — End: 2023-12-10
  Administered 2023-12-08 – 2023-12-09 (×2): 2 [IU] via SUBCUTANEOUS
  Administered 2023-12-09: 3 [IU] via SUBCUTANEOUS
  Administered 2023-12-10 (×4): 1 [IU] via SUBCUTANEOUS
  Filled 2023-12-08: qty 0.09

## 2023-12-08 MED ORDER — VITAMIN D 25 MCG (1000 UNIT) PO TABS
5000.0000 [IU] | ORAL_TABLET | Freq: Every day | ORAL | Status: DC
  Administered 2023-12-09 – 2023-12-11 (×3): 5000 [IU] via ORAL
  Filled 2023-12-08 (×3): qty 5

## 2023-12-08 MED ORDER — METHYLPHENIDATE HCL ER (OSM) 36 MG PO TBCR
36.0000 mg | EXTENDED_RELEASE_TABLET | Freq: Every morning | ORAL | Status: DC
  Administered 2023-12-09 – 2023-12-11 (×3): 36 mg via ORAL
  Filled 2023-12-08 (×5): qty 1

## 2023-12-08 MED ORDER — MAGNESIUM OXIDE -MG SUPPLEMENT 400 (240 MG) MG PO TABS
400.0000 mg | ORAL_TABLET | Freq: Every day | ORAL | Status: DC
  Administered 2023-12-08 – 2023-12-11 (×4): 400 mg via ORAL
  Filled 2023-12-08 (×4): qty 1

## 2023-12-08 MED ORDER — SODIUM CHLORIDE 0.9 % IV BOLUS
1000.0000 mL | Freq: Once | INTRAVENOUS | Status: AC
  Administered 2023-12-08: 1000 mL via INTRAVENOUS

## 2023-12-08 MED ORDER — LEVOTHYROXINE SODIUM 112 MCG PO TABS
112.0000 ug | ORAL_TABLET | Freq: Every day | ORAL | Status: DC
  Administered 2023-12-09 – 2023-12-12 (×4): 112 ug via ORAL
  Filled 2023-12-08 (×4): qty 1

## 2023-12-08 MED ORDER — SODIUM CHLORIDE 0.9 % IV SOLN
2.0000 g | Freq: Once | INTRAVENOUS | Status: AC
  Administered 2023-12-08: 2 g via INTRAVENOUS
  Filled 2023-12-08: qty 12.5

## 2023-12-08 MED ORDER — MELATONIN 5 MG PO TABS
10.0000 mg | ORAL_TABLET | Freq: Every day | ORAL | Status: DC
  Administered 2023-12-08 – 2023-12-11 (×4): 10 mg via ORAL
  Filled 2023-12-08 (×4): qty 2

## 2023-12-08 MED ORDER — VITAMIN B-12 1000 MCG PO TABS
3000.0000 ug | ORAL_TABLET | Freq: Every day | ORAL | Status: DC
  Administered 2023-12-09 – 2023-12-11 (×3): 3000 ug via ORAL
  Filled 2023-12-08 (×3): qty 3

## 2023-12-08 MED ORDER — DIPHENHYDRAMINE HCL 25 MG PO CAPS
25.0000 mg | ORAL_CAPSULE | Freq: Every day | ORAL | Status: DC
  Administered 2023-12-08 – 2023-12-11 (×4): 25 mg via ORAL
  Filled 2023-12-08 (×4): qty 1

## 2023-12-08 MED ORDER — INSULIN GLARGINE-YFGN 100 UNIT/ML ~~LOC~~ SOLN
4.0000 [IU] | Freq: Two times a day (BID) | SUBCUTANEOUS | Status: DC
  Administered 2023-12-08 – 2023-12-09 (×2): 4 [IU] via SUBCUTANEOUS
  Filled 2023-12-08 (×3): qty 0.04

## 2023-12-08 MED ORDER — SODIUM CHLORIDE 0.45 % IV SOLN
INTRAVENOUS | Status: AC
Start: 2023-12-08 — End: 2023-12-09

## 2023-12-08 MED ORDER — SIMETHICONE 40 MG/0.6ML PO SUSP
40.0000 mg | Freq: Four times a day (QID) | ORAL | Status: DC | PRN
  Filled 2023-12-08: qty 0.6

## 2023-12-08 MED ORDER — SODIUM CHLORIDE 0.9 % IV SOLN: 2.0000 g | INTRAVENOUS | Status: DC

## 2023-12-08 MED ORDER — LINACLOTIDE 145 MCG PO CAPS
145.0000 ug | ORAL_CAPSULE | Freq: Every day | ORAL | Status: DC
  Administered 2023-12-09 – 2023-12-11 (×3): 145 ug via ORAL
  Filled 2023-12-08 (×4): qty 1

## 2023-12-08 MED ORDER — CHLORHEXIDINE GLUCONATE CLOTH 2 % EX PADS
6.0000 | MEDICATED_PAD | Freq: Every day | CUTANEOUS | Status: DC
  Administered 2023-12-08 – 2023-12-11 (×4): 6 via TOPICAL

## 2023-12-08 MED ORDER — ALUM & MAG HYDROXIDE-SIMETH 200-200-20 MG/5ML PO SUSP
15.0000 mL | Freq: Four times a day (QID) | ORAL | Status: DC | PRN
  Administered 2023-12-08 – 2023-12-11 (×4): 15 mL via ORAL
  Filled 2023-12-08 (×4): qty 30

## 2023-12-08 MED ORDER — PANTOPRAZOLE SODIUM 40 MG PO TBEC
40.0000 mg | DELAYED_RELEASE_TABLET | Freq: Every day | ORAL | Status: DC
  Administered 2023-12-09 – 2023-12-11 (×3): 40 mg via ORAL
  Filled 2023-12-08 (×3): qty 1

## 2023-12-08 MED ORDER — SODIUM CHLORIDE 0.9 % IV SOLN: 500.0000 mg | INTRAVENOUS | Status: DC

## 2023-12-08 MED ORDER — SACCHAROMYCES BOULARDII 250 MG PO CAPS
250.0000 mg | ORAL_CAPSULE | Freq: Two times a day (BID) | ORAL | Status: DC
  Administered 2023-12-09 – 2023-12-11 (×6): 250 mg via ORAL
  Filled 2023-12-08 (×8): qty 1

## 2023-12-08 MED ORDER — POLYETHYLENE GLYCOL 3350 17 GM/SCOOP PO POWD: 17.0000 g | Freq: Every day | ORAL | Status: DC | PRN

## 2023-12-08 MED ORDER — ORAL CARE MOUTH RINSE: 15.0000 mL | OROMUCOSAL | Status: DC | PRN

## 2023-12-08 NOTE — ED Notes (Signed)
Unable to move pt to admit room due to physician in room

## 2023-12-08 NOTE — Progress Notes (Signed)
ED Pharmacy Antibiotic Sign Off An antibiotic consult was received from an ED provider for vancomycin and cefepime per pharmacy dosing for PNA. A chart review was completed to assess appropriateness.   The following one time order(s) were placed:  - cefepime 2gm x1 - vancomycin 1250 mg IV x1  Further antibiotic and/or antibiotic pharmacy consults should be ordered by the admitting provider if indicated.   Thank you for allowing pharmacy to be a part of this patient's care.   Lucia Gaskins, [facility]  Clinical Pharmacist 12/08/23 11:05 AM

## 2023-12-08 NOTE — ED Triage Notes (Addendum)
Patient to ED by POV reporting coughing up blood since 0230. She denies sore throat or chest discomfort but reports weakness and fatigue. Thyroid removed in April and states she has had dry cough since. Edema to BLE 4-5 months ago and joint pain. HX of Sjogren's syndrome . She reports coughing up bright red clots.

## 2023-12-08 NOTE — Hospital Course (Addendum)
57yof w/ Sjogren's syndrome, rheumatoid arthritis followed at [facility], hypothyroidism, type 1 diabetes, seizure disorder, migraine , previous SIBO, ADHD, chronic constipation, GERD, anemia, arthritis history of hepatitis B presented to the ED with coughing up blood since early morning 12/08/2023.  Patient has had chronic cough for a long time not on anticoagulants.  She has been having pain in her nose throat area, has been taking a lot of NSAIDs recently this is due to her Sjogren's syndrome.  She does complains of chronic leg swelling and has stable compression stocking has dyspnea on exertion.  Currently denies any chest pain nausea vomiting fever chills  In the [id number]:[id number] stable afebrile, labs with stable CMP lactic acid 0.8 mild leukocytosis 12.2 ESR 58 influenza COVID respiratory syncytial virus negative. [id number] area of poorly defined airspace opacity in right upper lobe  CT angio chest:large greater than 9 cm mass transversing the right upper lobe fissure into the right middle lobe and involving the right pulmonary artery. This is highly concerning for primary lung malignancy, abrupt cut off of the right upper lobe apical pulmonary artery-mass effect or PE, perifissural groundglass opacity around the mass, emphysema, subtle hypodensity in the liver.Patient was given vancomycin and cefepime blood culture ordered and admission requested. Seen by pulmonary felt no need for antibiotics. Patient has indicated-she does not want CPR at any cost, okay with trial of intubation.patient is not sure if she wants aggressive treatment for cancer such as chemotherapy and radiation but may be open to immunotherapy.  She shares that she has endured significant medical burden with her longstanding chronic autoimmune disorder and diabetes

## 2023-12-08 NOTE — H&P (Signed)
History and Physical    Mary Erickson:[id number] DOB: [birth year] DOA: 12/08/2023  PCP: Barnie Mort, PA-C   Patient coming from: Home  Chief Complaint  Patient presents with   Hemoptysis     HPI: Mary Erickson is a 58 y.o. female with medical history significant for  57yof w/ Sjogren's syndrome, rheumatoid arthritis followed at [facility], hypothyroidism, type 1 diabetes, seizure disorder, migraine , previous SIBO, ADHD, chronic constipation, GERD, anemia, arthritis history of hepatitis B presented to the ED with coughing up blood since early morning 12/08/2023.  Patient has had chronic cough for a long time not on anticoagulants.  She has been having pain in her nose throat area, has been taking a lot of NSAIDs recently this is due to her Sjogren's syndrome.  She does complains of chronic leg swelling and has stable compression stocking has dyspnea on exertion.  Currently denies any chest pain nausea vomiting fever chills  In the WJ:XBJYNW stable afebrile, labs with stable CMP lactic acid 0.8 mild leukocytosis 12.2 ESR 58 influenza COVID respiratory syncytial virus negative. GNF:AOZHY area of poorly defined airspace opacity in right upper lobe  CT angio chest: -Large consolidative mass centered within the posterior right upper lobe measuring 9.9 x 9.9 cm, crossing the major fissure into the superior segment right lower lobe and extending towards the hilum, highly suspicious for primary lung malignancy. -Abrupt cut off of right upper lobe apical subsegmental pulmonary artery, which may be secondary to mass effect or pulmonary embolism. No other filling defects in the central, lobar, segmental or subsegmental pulmonary artery branches to suggest acute pulmonary embolism. -Effacement and obstruction of at least 1 right upper lobe bronchiole by the mass. -Peripheral ground-glass opacity surrounding the mass with irregular nodule in the right apex measuring 1.1 x 0.5 cm, which  may be infectious/inflammatory or reflect satellite nodule. Right hilar lymphadenopathy, suspicious for metastatic disease. -Subtle hypodensity within hepatic segment 6 measures 4.0 x 3.2 cm, incompletely characterized. Recommend further evaluation with nonemergent contrast-enhanced MRI abdomen. -Emphysema Patient was given vancomycin and cefepime blood culture ordered and admission requested. Admission was requested pulmonary was consulted input pending.    Assessment/Plan Principal Problem:   Mass of upper lobe of right lung  Hemoptysis Large Upper love opacity concerning for primary lung malignancy 0.9 X9 0.8 cm Abrupt cut off of the right upper lobe apical pulmonary artery secondary to mass effect or PE Emphysema Peripheral groundglass opacity surrounding the mass infectious/inflammatory? Hypodensity in the hepatic segment: Pulmonary is being consulted-awaiting formal evaluation but at this time recommend to continue IV antibiotics supportive care continue supplemental oxygen monitor in stepdown unit will likely need bronchoscopy , holding off on [facility] as not clear PE vs Mass effect on CTA- awaiting Pulm eval.  Sjogren's syndrome Rheumatoid arthritis: followed at [facility].  Patient reports he gets pain itching nose/ear and different areas due to Sjogren's syndrome.  She has been off of a steroid and there is discussion for possible Enbrel, unable to be on methotrexate.cont Tylenol, recently she has been using " a lot of ibuprofen"-holding that for now due to hemoptysis  Hypothyroidism: Resume home Synthroid.  Type 1 diabetes on long-term insulin: Keep on sliding scale swelling and bid levemir. PTA on ss and 6 u bid levemir.  Seizure disorder/migraine/ADHD: Will continue her home Adderall.  Chronic constipation GERD Hx of SIBO: Resume PPI, Linzess.  Anemia: Hemoglobin stable.  Goals of care: Patient does not wish to receive CPR, agreeable for intubation if  needed in the  setting of her hemoptysis/procedure. She understands the complex nature of her lung mass, she wishes to die with dignity.  Awaiting further pulmonary input-possibly palliative care evaluation at some point.  Body mass index is 20.68 kg/m.   Severity of Illness: The appropriate patient status for this patient is INPATIENT. Inpatient status is judged to be reasonable and necessary in order to provide the required intensity of service to ensure the patient's safety. The patient's presenting symptoms, physical exam findings, and initial radiographic and laboratory data in the context of their chronic comorbidities is felt to place them at high risk for further clinical deterioration. Furthermore, it is not anticipated that the patient will be medically stable for discharge from the hospital within 2 midnights of admission.   * I certify that at the point of admission it is my clinical judgment that the patient will require inpatient hospital care spanning beyond 2 midnights from the point of admission due to high intensity of service, high risk for further deterioration and high frequency of surveillance required.*   DVT prophylaxis: SCDs Start: 12/08/23 1525  Code Status:   Code Status: Do not attempt resuscitation (DNR) PRE-ARREST INTERVENTIONS DESIRED  Family Communication: Admission, patients condition and plan of care including tests being ordered have been discussed with the patient a who indicate understanding and agree with the plan and Code Status.  Consults called:    Review of Systems: All systems were reviewed and were negative except as mentioned in HPI above. Negative for fever Negative for chest pain Negative for shortness of breath  Past Medical History:  Diagnosis Date   Acid reflux    ADHD (attention deficit hyperactivity disorder)    Anemia    Arthritis    "elbows, hands, neck, shoulders" (10/02/2016)   Calcium blood increased    Chronic neck pain    Chronic UTI  (urinary tract infection)    "from self caths" (10/02/2016)   Hashimoto's disease    Hepatitis B    acute hepatitis B from lancet   History of alcoholism (HCC)    10/02/2016 "sober since 03/14/2005"   History of blood transfusion 1984; 1989; 2008   "quite a few"   History of shortness of breath    Hypercholesteremia    Hypothyroidism    no meds   Internal carotid aneurysm dx'd early 2017   Legally blind in left eye, as defined in [place]    Melanoma [facility])    left upper arm   Migraines    "quite a few in a month; at least 15" (10/02/2016)   Neuropathy    neck; "not sure if it's from DM or Sjogren's" (10/02/2016)   Pneumonia 1986   "1 yr after major OR when I was under anesthesia for 13 hr"   Seizures (HCC) ~ 2004; ~ 2013   "seizures from diabetes "   Self-catheterizes urinary bladder    "since OR in 1984" (10/02/2016)   Sjogren's syndrome (HCC)    Thyroid goiter    "still present" (10/02/2016)   Type II diabetes mellitus (HCC)    Urinary retention    self caths   Wears glasses     Past Surgical History:  Procedure Laterality Date   ABDOMINAL HYSTERECTOMY  2008   BILATERAL CARPAL TUNNEL RELEASE Bilateral    ECTOPIC PREGNANCY SURGERY  1989   EXCISION MELANOMA WITH SENTINEL LYMPH NODE BIOPSY Left 10/02/2016   Procedure: WIDE LOCAL EXCISION AND ADVANCEMENT FLAP CLOSURE LEFT UPPER  MELANOMA WITH  SENTINEL LYMPH NODE MAPPING AND BIOPSY;  Surgeon: Almond Lint, MD;  Location: [facility] OR;  Service: General;  Laterality: Left;   EYE SURGERY     LAPAROSCOPIC CHOLECYSTECTOMY  ~ 01/2016   MELANOMA EXCISION WITH SENTINEL LYMPH NODE BIOPSY Left 10/02/2016   WIDE LOCAL EXCISION AND ADVANCEMENT FLAP CLOSURE LEFT UPPER  MELANOMA WITH SENTINEL LYMPH NODE MAPPING AND BIOPSY   OVARIAN CYST REMOVAL     SHOULDER ARTHROSCOPY W/ ROTATOR CUFF REPAIR Right 2014   "partial"   STRABISMUS SURGERY Left 1971   STRABISMUS SURGERY Left 03/18/2020   Procedure: STRABISMUS REPAIR;  Surgeon: Verne Carrow, MD;   Location: [facility];  Service: Ophthalmology;  Laterality: Left;   THYROIDECTOMY N/A 03/01/2023   Procedure: TOTAL THYROIDECTOMY;  Surgeon: Darnell Level, MD;  Location: [facility] ORS;  Service: General;  Laterality: N/A;   TUMOR EXCISION  1984   from tail bone and part of tail bone removed     reports that she quit smoking about 17 years ago. Her smoking use included cigarettes. She started smoking about 41 years ago. She has a 36 pack-year smoking history. She has never used smokeless tobacco. She reports that she does not currently use drugs after having used the following drugs: Marijuana. She reports that she does not drink alcohol.  Allergies  Allergen Reactions   Doxycycline Other (See Comments)    Elevates liver enzymes    Aspirin Other (See Comments)    Can take 81 mg but has to be coated; No other Aspirin due to bleeding in stools from ulcers   Ciprofloxacin Hives    Per pt, she can tolerate oral but not IV   Nsaids Other (See Comments)    GI issues    Amoxicillin-Pot Clavulanate Nausea Only   Gluten Meal     Avoid due to autoimmune issues    Lactose     Avoid due to autoimmune issues    Codeine Nausea And Vomiting   Hydrocodone-Acetaminophen Nausea And Vomiting   Oxycodone Nausea And Vomiting   Sulfa Antibiotics Nausea And Vomiting   Sulfasalazine Nausea And Vomiting    Family History  Problem Relation Age of Onset   Multiple sclerosis Father    Colon cancer Neg Hx    Stomach cancer Neg Hx    Colon polyps Neg Hx    Esophageal cancer Neg Hx    Rectal cancer Neg Hx      Prior to Admission medications   Medication Sig Start Date End Date Taking? Authorizing Provider  acetaminophen (TYLENOL) 500 MG tablet Take 1,000 mg by mouth every 6 (six) hours as needed for moderate pain.   Yes [provider]  aluminum-magnesium hydroxide 200-200 MG/5ML suspension Take by mouth every 6 (six) hours as needed for indigestion.   Yes [provider]   benzocaine (ORAJEL) 10 % mucosal gel Use as directed 1 Application in the mouth or throat as needed for mouth pain.   Yes [provider]  Cholecalciferol (VITAMIN D3) 125 MCG (5000 UT) capsule Take 5,000 Units by mouth daily.   Yes [provider]  Continuous Glucose Sensor (DEXCOM G7 SENSOR) MISC Inject 1 applicator into the skin See admin instructions. Every 10 days 11/27/23  Yes [provider]  Cyanocobalamin (B-12) 3000 MCG CAPS Take 3,000 mcg by mouth daily.   Yes [provider]  diclofenac Sodium (VOLTAREN) 1 % GEL Apply 2 g topically 4 (four) times daily. Patient taking differently: Apply 2 g topically 4 (four)  times daily as needed (pain). 12/17/19  Yes Darr, Gerilyn Pilgrim, PA-C  diphenhydrAMINE HCl (BENADRYL ALLERGY PO) Take 1 tablet by mouth at bedtime.   Yes [provider]  fluconazole (DIFLUCAN) 150 MG tablet Take 150 mg by mouth See admin instructions. Take 150 mg every 3 days for 3 doses starting on the 15th of every month   Yes [provider]  glucose blood (ACCU-CHEK GUIDE) test strip 1 each by Other route as needed. 06/05/23  Yes [provider]  ibuprofen (ADVIL) 400 MG tablet Take 400 mg by mouth every 6 (six) hours as needed for moderate pain (pain score 4-6).   Yes [provider]  Insulin Detemir (LEVEMIR FLEXTOUCH) 100 UNIT/ML Pen Inject 4 Units into the skin 2 (two) times daily. 09/27/17  Yes [provider]  Insulin lispro (HUMALOG JUNIOR KWIKPEN) 100 UNIT/ML Inject 1-7 Units into the skin as needed (high blood sugar).   Yes [provider]  Ketotifen Fumarate ([employer] ALLERGY EYE DROPS OP) Place 1 drop into both eyes 2 (two) times daily.   Yes [provider]  levothyroxine (SYNTHROID) 112 MCG tablet Take 112 mcg by mouth daily before breakfast.   Yes [provider]  linaclotide (LINZESS) 145 MCG CAPS capsule Take 1 capsule (145 mcg total) by mouth daily before breakfast. 11/14/23   Yes Imogene Burn, MD  Magnesium 400 MG CAPS Take 400 mg by mouth daily.   Yes [provider]  Melatonin 10-10 MG TBCR Take 10 mg by mouth at bedtime.   Yes [provider]  methylphenidate 36 MG PO CR tablet Take 36 mg by mouth every morning.   Yes [provider]  Multiple Vitamins-Minerals (MULTIVITAMINS THER. W/MINERALS) TABS Take 1 tablet by mouth daily.   Yes [provider]  nystatin cream (MYCOSTATIN) Apply 1 Application topically 2 (two) times daily.   Yes [provider]  omeprazole (PRILOSEC) 20 MG capsule Take 1 capsule (20 mg total) by mouth 2 (two) times daily before a meal. 11/14/23  Yes Imogene Burn, MD  ondansetron (ZOFRAN ODT) 4 MG disintegrating tablet Take 1 tablet (4 mg total) by mouth every 6 (six) hours as needed for nausea or vomiting. 06/18/23  Yes Esterwood, Amy S, PA-C  Polyethyl Glycol-Propyl Glycol (SYSTANE OP) Place 1 drop into both eyes 3 (three) times daily as needed (dry eyes).   Yes [provider]  polyethylene glycol powder (GLYCOLAX/MIRALAX) 17 GM/SCOOP powder Take 17 g by mouth daily as needed for moderate constipation.   Yes [provider]  predniSONE (DELTASONE) 5 MG tablet Take 5 mg by mouth 2 (two) times daily.   Yes [provider]   Physical Exam: Vitals:   12/08/23 0932 12/08/23 0933 12/08/23 1244  BP: (!) 145/94  (!) 134/91  Pulse: 80  71  Resp: 15  10  Temp: 97.9 F (36.6 C)  98.7 F (37.1 C)  TempSrc: Oral    SpO2: 100%  98%  Weight:  61.7 kg   Height:  5\' 8"  (1.727 m)     General exam: AAOx3 , NAD, weak appearing. HEENT:Oral mucosa moist, Ear/Nose WNL grossly, dentition normal. Respiratory system: bilaterally diminished,no wheezing or crackles,no use of accessory muscle Cardiovascular system: S1 & S2 +, No JVD,. Gastrointestinal system: Abdomen soft, NT,ND, BS+ Nervous System:Alert, awake, moving extremities and grossly nonfocal Extremities: No edema, distal  peripheral pulses palpable.  Skin: No rashes,no icterus. MSK: Normal muscle bulk,tone, power   Labs on Admission: I have  personally reviewed following labs and imaging studies  CBC: Recent Labs  Lab 12/08/23 1147  WBC 12.2*  NEUTROABS 9.7*  HGB 12.6  HCT 39.2  MCV 91.0  PLT 422*   Basic Metabolic Panel: Recent Labs  Lab 12/08/23 1147  NA 135  K 4.2  CL 100  CO2 26  GLUCOSE 154*  BUN 13  CREATININE 0.54  CALCIUM 9.9   Estimated Creatinine Clearance: 75.6 mL/min (by C-G formula based on SCr of 0.54 mg/dL). Recent Labs  Lab 12/08/23 1147  AST 31  ALT 20  ALKPHOS 141*  BILITOT 0.6  PROT 9.0*  ALBUMIN 4.2   Recent Labs  Lab 12/08/23 1147  LIPASE 27   No results for input(s): "AMMONIA" in the last 168 hours. Coagulation Profile: Recent Labs  Lab 12/08/23 1147  INR 0.9  No results for input(s): "TSH", "T4TOTAL", "FREET4", "T3FREE", "THYROIDAB" in the last 72 hours. Urine analysis:    Component Value Date/Time   COLORURINE COLORLESS (A) 03/07/2023 1531   APPEARANCEUR CLEAR 03/07/2023 1531   LABSPEC 1.003 (L) 03/07/2023 1531   PHURINE 8.0 03/07/2023 1531   GLUCOSEU NEGATIVE 03/07/2023 1531   HGBUR NEGATIVE 03/07/2023 1531   BILIRUBINUR NEGATIVE 03/07/2023 1531   KETONESUR NEGATIVE 03/07/2023 1531   PROTEINUR NEGATIVE 03/07/2023 1531   UROBILINOGEN 0.2 11/21/2022 1337   NITRITE NEGATIVE 03/07/2023 1531   LEUKOCYTESUR TRACE (A) 03/07/2023 1531    Radiological Exams on Admission: CT Angio Chest PE W and/or Wo Contrast Result Date: 12/08/2023 CLINICAL DATA:  Hemoptysis with right upper chest opacity on same day chest radiograph EXAM: CT ANGIOGRAPHY CHEST WITH CONTRAST TECHNIQUE: Multidetector CT imaging of the chest was performed using the standard protocol during bolus administration of intravenous contrast. Multiplanar CT image reconstructions and MIPs were obtained to evaluate the vascular anatomy. RADIATION DOSE REDUCTION: This exam was performed  according to the departmental dose-optimization program which includes automated exposure control, adjustment of the mA and/or kV according to patient size and/or use of iterative reconstruction technique. CONTRAST:  75mL OMNIPAQUE IOHEXOL 350 MG/ML SOLN COMPARISON:  Same day chest radiograph FINDINGS: Cardiovascular: The study is high quality for the evaluation of pulmonary embolism. There is abrupt cut off of right upper lobe apical subsegmental pulmonary artery (10:132). There are otherwise no filling defects in the central, lobar, segmental or subsegmental pulmonary artery branches to suggest acute pulmonary embolism. Great vessels are normal in course and caliber. Normal heart size. No significant pericardial fluid/thickening. Mediastinum/Nodes: Thyroidectomy. Normal esophagus. 18 mm right hilar lymphadenopathy (4:66). Left axillary surgical clips. Lungs/Pleura: The central airways are patent. Moderate effacement of right upper lobe bronchus and bronchials with suspected complete obstruction of at least 1 segmental airway (12:57) secondary to consolidative mass centered within the posterior right upper lobe measuring 9.9 x 9.9 cm (12:57). This mass crosses the major fissure into the superior segment right lower lobe and extends towards the hilum. There is peripheral ground-glass opacity surrounding the mass with irregular nodule in the right apex measuring 1.1 x 0.5 cm (12:22). Mild upper lobe predominant centrilobular emphysema. Subsegmental right middle lobe and lingular atelectasis. No pneumothorax. No pleural effusion. Upper abdomen: Subtle hypodensity within hepatic segment 6 measures 4.0 x 3.2 cm (4:151). Musculoskeletal: No acute or abnormal lytic or blastic osseous lesions. Review of the MIP images confirms the above findings. IMPRESSION: 1. Large consolidative mass centered within the posterior right upper lobe measuring 9.9 x 9.9 cm, crossing the major fissure into the superior segment right lower  lobe and extending  towards the hilum, highly suspicious for primary lung malignancy. 2. Abrupt cut off of right upper lobe apical subsegmental pulmonary artery, which may be secondary to mass effect or pulmonary embolism. No other filling defects in the central, lobar, segmental or subsegmental pulmonary artery branches to suggest acute pulmonary embolism. 3. Effacement and obstruction of at least 1 right upper lobe bronchiole by the mass. 4. Peripheral ground-glass opacity surrounding the mass with irregular nodule in the right apex measuring 1.1 x 0.5 cm, which may be infectious/inflammatory or reflect satellite nodule. 5. Right hilar lymphadenopathy, suspicious for metastatic disease. 6. Subtle hypodensity within hepatic segment 6 measures 4.0 x 3.2 cm, incompletely characterized. Recommend further evaluation with nonemergent contrast-enhanced MRI abdomen. 7.  Emphysema (ICD10-J43.9). Electronically Signed   By: Agustin Cree M.D.   On: 12/08/2023 14:42   DG Chest Portable 1 View Result Date: 12/08/2023 CLINICAL DATA:  Hemoptysis. EXAM: PORTABLE CHEST 1 VIEW COMPARISON:  04/16/2021 FINDINGS: Heart size is normal. Large area of poorly defined airspace opacity is seen in the right upper lobe, which could be due to pneumonia or neoplasm. Left lung is clear. No pleural effusion. IMPRESSION: Large area of poorly defined airspace opacity in right upper lobe, which could be due to pneumonia or neoplasm. Clinical correlation is recommended. Consider follow-up chest radiographs or CT. Electronically Signed   By: Danae Orleans M.D.   On: 12/08/2023 10:33   Lanae Boast MD [facility]  If 7PM-7AM, please contact night-coverage [email or web address]  12/08/2023, 3:55 PM

## 2023-12-08 NOTE — ED Notes (Signed)
Pt did not want another IV at this time until she talked with Dr.

## 2023-12-08 NOTE — Consult Note (Addendum)
NAME:  Mary Erickson, MRN:  [id number], DOB:  13-Aug-1966, LOS: 0 ADMISSION DATE:  12/08/2023, CONSULTATION DATE:  12/08/2023 REFERRING MD:  Lanae Boast, MD, CHIEF COMPLAINT:  lung mass  History of Present Illness:  Mary Erickson is a 58 year old woman with past medical history of Sjogren's, type 1 diabetes mellitus, tobacco use disorder quit in 2010, who presents with cough and hemoptysis.  She notes having thyroid surgery in April 2024 and a couple of days after that she started having a nagging cough.  She notes the cough has been pretty persistent over the last year but yesterday in the middle of the night she coughed up a teaspoon of bright red blood.  She notes that her cough is worse with any kind of exertion or movement.  She had worsening hemoptysis which is what caused her to present to the ED.  She denies fevers chills night sweats but does note she has a trouble gaining weight over the last few months.  She had a chest x-ray which showed a opacification of the right upper lobe which led to CT scan which shows a large greater than 9 cm mass transversing the right upper lobe fissure into the right middle lobe and involving the right pulmonary artery.  This is highly concerning for primary lung malignancy.  She notes being chronically ill for much of her life with her autoimmune disorders and has seen family members in the healthcare setting.  She does not want to undergo chest compressions under any circumstances but would be open to intubation for reversible treatable causes.  She is not sure she wants aggressive treatment for cancer such as chemotherapy and radiation but may be open to immunotherapy.  Regardless she is open to biopsy options including bronchoscopy if necessary.  PCCM was consulted to assist in management of the pulmonary mass.  Pertinent  Medical History  Sjogren's Type 1 DM Emphysema Quit smoking 2010, 50 pack year smoker with passive smoke exposure  Significant  Hospital Events: Including procedures, antibiotic start and stop dates in addition to other pertinent events     Interim History / Subjective:    Objective   Blood pressure (!) 140/80, pulse 81, temperature 98.7 F (37.1 C), resp. rate (!) 22, height 5\' 8"  (1.727 m), weight 61.7 kg, SpO2 95%.       No intake or output data in the 24 hours ending 12/08/23 1632 Filed Weights   12/08/23 0933  Weight: 61.7 kg    Examination: General: Appears older than stated age HENT: Dry mucous membranes Lungs: Breath sound diminished right lung otherwise breathing nonlabored no wheeze Cardiovascular: Regular rate and rhythm Abdomen: Soft, scaphoid, nondistended Extremities: No peripheral edema, diffuse arthritic changes in upper EXTR Neuro: Normal speech no focal asymmetry  CT chest personally reviewed, findings below  Resolved Hospital Problem list     Assessment & Plan:   Right upper lobe lung mass greater than 9 cm enveloping the right pulmonary artery and crossing across the fissure into the right middle lobe Emphysema with 50-pack-year smoking history, quit 15 years ago  N.p.o. at midnight.  Will try to put on schedule for bronchoscopy tomorrow. She has indicated her wishes to never undergo chest compressions under any circumstances.  However she would be okay with a trial of intubation for potentially reversible causes. Don't feel strongly she needs abx.   I spent 80 minutes in total visit time for this patient, with more than 50% spent counseling/coordinating care.  Durel Salts, MD  Pulmonary and Critical Care Medicine [facility] 12/08/2023 5:24 PM Pager: see AMION  If no response to pager, please call critical care on call (see AMION) until 7pm After 7:00 pm call Elink      Labs   CBC: Recent Labs  Lab 12/08/23 1147  WBC 12.2*  NEUTROABS 9.7*  HGB 12.6  HCT 39.2  MCV 91.0  PLT 422*    Basic Metabolic Panel: Recent Labs  Lab 12/08/23 1147  NA 135  K  4.2  CL 100  CO2 26  GLUCOSE 154*  BUN 13  CREATININE 0.54  CALCIUM 9.9   GFR: Estimated Creatinine Clearance: 75.6 mL/min (by C-G formula based on SCr of 0.54 mg/dL). Recent Labs  Lab 12/08/23 1147 12/08/23 1249  WBC 12.2*  --   LATICACIDVEN  --  0.8    Liver Function Tests: Recent Labs  Lab 12/08/23 1147  AST 31  ALT 20  ALKPHOS 141*  BILITOT 0.6  PROT 9.0*  ALBUMIN 4.2   Recent Labs  Lab 12/08/23 1147  LIPASE 27   No results for input(s): "AMMONIA" in the last 168 hours.  ABG    Component Value Date/Time   TCO2 27 12/07/2019 0607     Coagulation Profile: Recent Labs  Lab 12/08/23 1147  INR 0.9    Cardiac Enzymes: No results for input(s): "CKTOTAL", "CKMB", "CKMBINDEX", "TROPONINI" in the last 168 hours.  HbA1C: Hgb A1c MFr Bld  Date/Time Value Ref Range Status  03/01/2023 11:53 AM 6.9 (H) 4.8 - 5.6 % Final    Comment:    (NOTE) Pre diabetes:          5.7%-6.4%  Diabetes:              >6.4%  Glycemic control for   <7.0% adults with diabetes   04/17/2021 02:22 AM 6.6 (H) 4.8 - 5.6 % Final    Comment:    (NOTE)         Prediabetes: 5.7 - 6.4         Diabetes: >6.4         Glycemic control for adults with diabetes: <7.0     CBG: No results for input(s): "GLUCAP" in the last 168 hours.  Review of Systems:   As in hpi  Past Medical History:  She,  has a past medical history of Acid reflux, ADHD (attention deficit hyperactivity disorder), Anemia, Arthritis, Calcium blood increased, Chronic neck pain, Chronic UTI (urinary tract infection), Hashimoto's disease, Hepatitis B, History of alcoholism (HCC), History of blood transfusion (1984; 1989; 2008), History of shortness of breath, Hypercholesteremia, Hypothyroidism, Internal carotid aneurysm (dx'd early 2017), Legally blind in left eye, as defined in [place], Melanoma (HCC), Migraines, Neuropathy, Pneumonia (1986), Seizures (HCC) (~ 2004; ~ 2013), Self-catheterizes urinary bladder, Sjogren's  syndrome (HCC), Thyroid goiter, Type II diabetes mellitus (HCC), Urinary retention, and Wears glasses.   Surgical History:   Past Surgical History:  Procedure Laterality Date   ABDOMINAL HYSTERECTOMY  2008   BILATERAL CARPAL TUNNEL RELEASE Bilateral    ECTOPIC PREGNANCY SURGERY  1989   EXCISION MELANOMA WITH SENTINEL LYMPH NODE BIOPSY Left 10/02/2016   Procedure: WIDE LOCAL EXCISION AND ADVANCEMENT FLAP CLOSURE LEFT UPPER  MELANOMA WITH SENTINEL LYMPH NODE MAPPING AND BIOPSY;  Surgeon: Almond Lint, MD;  Location: [facility] OR;  Service: General;  Laterality: Left;   EYE SURGERY     LAPAROSCOPIC CHOLECYSTECTOMY  ~ 01/2016   MELANOMA EXCISION WITH SENTINEL LYMPH NODE BIOPSY Left 10/02/2016   WIDE  LOCAL EXCISION AND ADVANCEMENT FLAP CLOSURE LEFT UPPER  MELANOMA WITH SENTINEL LYMPH NODE MAPPING AND BIOPSY   OVARIAN CYST REMOVAL     SHOULDER ARTHROSCOPY W/ ROTATOR CUFF REPAIR Right 2014   "partial"   STRABISMUS SURGERY Left 1971   STRABISMUS SURGERY Left 03/18/2020   Procedure: STRABISMUS REPAIR;  Surgeon: Verne Carrow, MD;  Location: [facility];  Service: Ophthalmology;  Laterality: Left;   THYROIDECTOMY N/A 03/01/2023   Procedure: TOTAL THYROIDECTOMY;  Surgeon: Darnell Level, MD;  Location: [facility];  Service: General;  Laterality: N/A;   TUMOR EXCISION  1984   from tail bone and part of tail bone removed     Social History:   reports that she quit smoking about 17 years ago. Her smoking use included cigarettes. She started smoking about 41 years ago. She has a 36 pack-year smoking history. She has never used smokeless tobacco. She reports that she does not currently use drugs after having used the following drugs: Marijuana. She reports that she does not drink alcohol.   Family History:  Her family history includes Multiple sclerosis in her father. There is no history of Colon cancer, Stomach cancer, Colon polyps, Esophageal cancer, or Rectal cancer.   Allergies Allergies   Allergen Reactions   Doxycycline Other (See Comments)    Elevates liver enzymes    Aspirin Other (See Comments)    Can take 81 mg but has to be coated; No other Aspirin due to bleeding in stools from ulcers   Ciprofloxacin Hives    Per pt, she can tolerate oral but not IV   Nsaids Other (See Comments)    GI issues    Amoxicillin-Pot Clavulanate Nausea Only   Gluten Meal     Avoid due to autoimmune issues    Lactose     Avoid due to autoimmune issues    Codeine Nausea And Vomiting   Hydrocodone-Acetaminophen Nausea And Vomiting   Oxycodone Nausea And Vomiting   Sulfa Antibiotics Nausea And Vomiting   Sulfasalazine Nausea And Vomiting     Home Medications  Prior to Admission medications   Medication Sig Start Date End Date Taking? Authorizing Provider  acetaminophen (TYLENOL) 500 MG tablet Take 1,000 mg by mouth every 6 (six) hours as needed for moderate pain.   Yes [provider]  aluminum-magnesium hydroxide 200-200 MG/5ML suspension Take by mouth every 6 (six) hours as needed for indigestion.   Yes [provider]  benzocaine (ORAJEL) 10 % mucosal gel Use as directed 1 Application in the mouth or throat as needed for mouth pain.   Yes [provider]  Cholecalciferol (VITAMIN D3) 125 MCG (5000 UT) capsule Take 5,000 Units by mouth daily.   Yes [provider]  Continuous Glucose Sensor (DEXCOM G7 SENSOR) MISC Inject 1 applicator into the skin See admin instructions. Every 10 days 11/27/23  Yes [provider]  Cyanocobalamin (B-12) 3000 MCG CAPS Take 3,000 mcg by mouth daily.   Yes [provider]  diclofenac Sodium (VOLTAREN) 1 % GEL Apply 2 g topically 4 (four) times daily. Patient taking differently: Apply 2 g topically 4 (four) times daily as needed (pain). 12/17/19  Yes Darr, Gerilyn Pilgrim, PA-C  diphenhydrAMINE HCl (BENADRYL ALLERGY PO) Take 1 tablet by mouth at bedtime.   Yes [provider]  fluconazole (DIFLUCAN) 150  MG tablet Take 150 mg by mouth See admin instructions. Take 150 mg every 3 days for 3 doses starting on the 15th of every month  Yes [provider]  glucose blood (ACCU-CHEK GUIDE) test strip 1 each by Other route as needed. 06/05/23  Yes [provider]  ibuprofen (ADVIL) 400 MG tablet Take 400 mg by mouth every 6 (six) hours as needed for moderate pain (pain score 4-6).   Yes [provider]  Insulin Detemir (LEVEMIR FLEXTOUCH) 100 UNIT/ML Pen Inject 4 Units into the skin 2 (two) times daily. 09/27/17  Yes [provider]  Insulin lispro (HUMALOG JUNIOR KWIKPEN) 100 UNIT/ML Inject 1-7 Units into the skin as needed (high blood sugar).   Yes [provider]  Ketotifen Fumarate ([employer] ALLERGY EYE DROPS OP) Place 1 drop into both eyes 2 (two) times daily.   Yes [provider]  levothyroxine (SYNTHROID) 112 MCG tablet Take 112 mcg by mouth daily before breakfast.   Yes [provider]  linaclotide (LINZESS) 145 MCG CAPS capsule Take 1 capsule (145 mcg total) by mouth daily before breakfast. 11/14/23  Yes Imogene Burn, MD  Magnesium 400 MG CAPS Take 400 mg by mouth daily.   Yes [provider]  Melatonin 10-10 MG TBCR Take 10 mg by mouth at bedtime.   Yes [provider]  methylphenidate 36 MG PO CR tablet Take 36 mg by mouth every morning.   Yes [provider]  Multiple Vitamins-Minerals (MULTIVITAMINS THER. W/MINERALS) TABS Take 1 tablet by mouth daily.   Yes [provider]  nystatin cream (MYCOSTATIN) Apply 1 Application topically 2 (two) times daily.   Yes [provider]  omeprazole (PRILOSEC) 20 MG capsule Take 1 capsule (20 mg total) by mouth 2 (two) times daily before a meal. 11/14/23  Yes Imogene Burn, MD  ondansetron (ZOFRAN ODT) 4 MG disintegrating tablet Take 1 tablet (4 mg total) by mouth every 6 (six) hours as needed for nausea or vomiting. 06/18/23  Yes Esterwood, Amy S, PA-C   Polyethyl Glycol-Propyl Glycol (SYSTANE OP) Place 1 drop into both eyes 3 (three) times daily as needed (dry eyes).   Yes [provider]  polyethylene glycol powder (GLYCOLAX/MIRALAX) 17 GM/SCOOP powder Take 17 g by mouth daily as needed for moderate constipation.   Yes [provider]  predniSONE (DELTASONE) 5 MG tablet Take 5 mg by mouth 2 (two) times daily.   Yes [provider]

## 2023-12-08 NOTE — ED Provider Notes (Signed)
[place] EMERGENCY DEPARTMENT AT [facility] Provider Note   CSN: [id number] Arrival date & time: 12/08/23  7829     History  Chief Complaint  Patient presents with   Hemoptysis    Mary Erickson is a 58 y.o. female.  Patient here with several episodes of coughing up of blood today.  She has history of Sjogren syndrome.  She has had thyroid surgery last year.  She has been having chronic cough for a long time but started coughing up blood today.  Is on any blood thinners.  She denies any chest pain shortness of breath fever chills.  History of alcohol use diabetes thyroid disease.  She is to be on chronic steroids but she is now on Plaquenil for her rheumatological processes.  The history is provided by the patient.       Home Medications Prior to Admission medications   Medication Sig Start Date End Date Taking? Authorizing Provider  acetaminophen (TYLENOL) 500 MG tablet Take 1,000 mg by mouth every 6 (six) hours as needed for moderate pain.   Yes [provider]  aluminum-magnesium hydroxide 200-200 MG/5ML suspension Take by mouth every 6 (six) hours as needed for indigestion.   Yes [provider]  benzocaine (ORAJEL) 10 % mucosal gel Use as directed 1 Application in the mouth or throat as needed for mouth pain.   Yes [provider]  Cholecalciferol (VITAMIN D3) 125 MCG (5000 UT) capsule Take 5,000 Units by mouth daily.   Yes [provider]  Continuous Glucose Sensor (DEXCOM G7 SENSOR) MISC Inject 1 applicator into the skin See admin instructions. Every 10 days 11/27/23  Yes [provider]  Cyanocobalamin (B-12) 3000 MCG CAPS Take 3,000 mcg by mouth daily.   Yes [provider]  diclofenac Sodium (VOLTAREN) 1 % GEL Apply 2 g topically 4 (four) times daily. Patient taking differently: Apply 2 g topically 4 (four) times daily as needed (pain). 12/17/19  Yes Darr, Gerilyn Pilgrim, PA-C  diphenhydrAMINE HCl (BENADRYL  ALLERGY PO) Take 1 tablet by mouth at bedtime.   Yes [provider]  fluconazole (DIFLUCAN) 150 MG tablet Take 150 mg by mouth See admin instructions. Take 150 mg every 3 days for 3 doses starting on the 15th of every month   Yes [provider]  glucose blood (ACCU-CHEK GUIDE) test strip 1 each by Other route as needed. 06/05/23  Yes [provider]  ibuprofen (ADVIL) 400 MG tablet Take 400 mg by mouth every 6 (six) hours as needed for moderate pain (pain score 4-6).   Yes [provider]  Insulin Detemir (LEVEMIR FLEXTOUCH) 100 UNIT/ML Pen Inject 4 Units into the skin 2 (two) times daily. 09/27/17  Yes [provider]  Insulin lispro (HUMALOG JUNIOR KWIKPEN) 100 UNIT/ML Inject 1-7 Units into the skin as needed (high blood sugar).   Yes [provider]  Ketotifen Fumarate ([employer] ALLERGY EYE DROPS OP) Place 1 drop into both eyes 2 (two) times daily.   Yes [provider]  levothyroxine (SYNTHROID) 112 MCG tablet Take 112 mcg by mouth daily before breakfast.   Yes [provider]  linaclotide (LINZESS) 145 MCG CAPS capsule Take 1 capsule (145 mcg total) by mouth daily before breakfast. 11/14/23  Yes Imogene Burn, MD  Magnesium 400 MG CAPS Take 400 mg by mouth daily.   Yes [provider]  Melatonin 10-10 MG TBCR Take 10 mg by mouth at bedtime.   Yes [provider]  methylphenidate 36 MG PO CR tablet Take 36 mg by mouth every morning.   Yes [provider]  Multiple Vitamins-Minerals (MULTIVITAMINS THER. W/MINERALS) TABS Take 1 tablet by mouth daily.   Yes [provider]  nystatin cream (MYCOSTATIN) Apply 1 Application topically 2 (two) times daily.   Yes [provider]  omeprazole (PRILOSEC) 20 MG capsule Take 1 capsule (20 mg total) by mouth 2 (two) times daily before a meal. 11/14/23  Yes Imogene Burn, MD  ondansetron (ZOFRAN ODT) 4 MG disintegrating tablet Take 1 tablet (4 mg total)  by mouth every 6 (six) hours as needed for nausea or vomiting. 06/18/23  Yes Esterwood, Amy S, PA-C  Polyethyl Glycol-Propyl Glycol (SYSTANE OP) Place 1 drop into both eyes 3 (three) times daily as needed (dry eyes).   Yes [provider]  polyethylene glycol powder (GLYCOLAX/MIRALAX) 17 GM/SCOOP powder Take 17 g by mouth daily as needed for moderate constipation.   Yes [provider]  predniSONE (DELTASONE) 5 MG tablet Take 5 mg by mouth 2 (two) times daily.   Yes [provider]      Allergies    Doxycycline, Aspirin, Ciprofloxacin, Nsaids, Amoxicillin-pot clavulanate, Gluten meal, Lactose, Codeine, Hydrocodone-acetaminophen, Oxycodone, Sulfa antibiotics, and Sulfasalazine    Review of Systems   Review of Systems  Physical Exam Updated Vital Signs BP (!) 134/91   Pulse 71   Temp 98.7 F (37.1 C)   Resp 10   Ht 5\' 8"  (1.727 m)   Wt 61.7 kg   SpO2 98%   BMI 20.68 kg/m  Physical Exam Vitals and nursing note reviewed.  Constitutional:      General: She is not in acute distress.    Appearance: She is well-developed. She is not ill-appearing.  HENT:     Head: Normocephalic and atraumatic.     Nose: Nose normal.  Eyes:     Extraocular Movements: Extraocular movements intact.     Conjunctiva/sclera: Conjunctivae normal.     Pupils: Pupils are equal, round, and reactive to light.  Cardiovascular:     Rate and Rhythm: Normal rate and regular rhythm.     Heart sounds: No murmur heard. Pulmonary:     Effort: Pulmonary effort is normal. No respiratory distress.     Breath sounds: Normal breath sounds.  Abdominal:     Palpations: Abdomen is soft.     Tenderness: There is no abdominal tenderness.  Musculoskeletal:        General: No swelling.     Cervical back: Normal range of motion and neck supple.  Skin:    General: Skin is warm and dry.     Capillary Refill: Capillary refill takes less than 2 seconds.  Neurological:     Mental Status: She is alert.   Psychiatric:        Mood and Affect: Mood normal.     ED Results / Procedures / Treatments   Labs (all labs ordered are listed, but only abnormal results are displayed) Labs Reviewed  CBC WITH DIFFERENTIAL/PLATELET - Abnormal; Notable for the following components:      Result Value   WBC 12.2 (*)    Platelets 422 (*)    Neutro Abs 9.7 (*)    All other components within normal limits  COMPREHENSIVE METABOLIC PANEL - Abnormal; Notable for the following components:   Glucose, Bld 154 (*)    Total Protein 9.0 (*)    Alkaline Phosphatase 141 (*)    All other components  within normal limits  SEDIMENTATION RATE - Abnormal; Notable for the following components:   Sed Rate 58 (*)    All other components within normal limits  RESP PANEL BY RT-PCR (RSV, FLU A&B, COVID)  [id number]  CULTURE, BLOOD (ROUTINE X 2)  CULTURE, BLOOD (ROUTINE X 2)  LIPASE, BLOOD  PROTIME-INR  C3 COMPLEMENT  C-REACTIVE PROTEIN  CYCLIC CITRUL PEPTIDE ANTIBODY, IGG/IGA  PROTEIN ELECTRO, RANDOM URINE  PROTEIN ELECTROPHORESIS, SERUM  C4 COMPLEMENT  RHEUMATOID FACTOR  I-STAT CG4 LACTIC ACID, ED    EKG EKG Interpretation Date/Time:  Sunday December 08 2023 11:57:40 EST Ventricular Rate:  66 PR Interval:  151 QRS Duration:  97 QT Interval:  380 QTC Calculation: 399 R Axis:   53  Text Interpretation: Sinus rhythm Low voltage, extremity and precordial leads Consider anterior infarct Confirmed by Virgina Norfolk [phone]) on 12/08/2023 11:59:13 AM  Radiology CT Angio Chest PE W and/or Wo Contrast Result Date: 12/08/2023 CLINICAL DATA:  Hemoptysis with right upper chest opacity on same day chest radiograph EXAM: CT ANGIOGRAPHY CHEST WITH CONTRAST TECHNIQUE: Multidetector CT imaging of the chest was performed using the standard protocol during bolus administration of intravenous contrast. Multiplanar CT image reconstructions and MIPs were obtained to evaluate the vascular anatomy. RADIATION DOSE REDUCTION: This exam was  performed according to the departmental dose-optimization program which includes automated exposure control, adjustment of the mA and/or kV according to patient size and/or use of iterative reconstruction technique. CONTRAST:  75mL OMNIPAQUE IOHEXOL 350 MG/ML SOLN COMPARISON:  Same day chest radiograph FINDINGS: Cardiovascular: The study is high quality for the evaluation of pulmonary embolism. There is abrupt cut off of right upper lobe apical subsegmental pulmonary artery (10:132). There are otherwise no filling defects in the central, lobar, segmental or subsegmental pulmonary artery branches to suggest acute pulmonary embolism. Great vessels are normal in course and caliber. Normal heart size. No significant pericardial fluid/thickening. Mediastinum/Nodes: Thyroidectomy. Normal esophagus. 18 mm right hilar lymphadenopathy (4:66). Left axillary surgical clips. Lungs/Pleura: The central airways are patent. Moderate effacement of right upper lobe bronchus and bronchials with suspected complete obstruction of at least 1 segmental airway (12:57) secondary to consolidative mass centered within the posterior right upper lobe measuring 9.9 x 9.9 cm (12:57). This mass crosses the major fissure into the superior segment right lower lobe and extends towards the hilum. There is peripheral ground-glass opacity surrounding the mass with irregular nodule in the right apex measuring 1.1 x 0.5 cm (12:22). Mild upper lobe predominant centrilobular emphysema. Subsegmental right middle lobe and lingular atelectasis. No pneumothorax. No pleural effusion. Upper abdomen: Subtle hypodensity within hepatic segment 6 measures 4.0 x 3.2 cm (4:151). Musculoskeletal: No acute or abnormal lytic or blastic osseous lesions. Review of the MIP images confirms the above findings. IMPRESSION: 1. Large consolidative mass centered within the posterior right upper lobe measuring 9.9 x 9.9 cm, crossing the major fissure into the superior segment right  lower lobe and extending towards the hilum, highly suspicious for primary lung malignancy. 2. Abrupt cut off of right upper lobe apical subsegmental pulmonary artery, which may be secondary to mass effect or pulmonary embolism. No other filling defects in the central, lobar, segmental or subsegmental pulmonary artery branches to suggest acute pulmonary embolism. 3. Effacement and obstruction of at least 1 right upper lobe bronchiole by the mass. 4. Peripheral ground-glass opacity surrounding the mass with irregular nodule in the right apex measuring 1.1 x 0.5 cm, which may be infectious/inflammatory or reflect satellite nodule. 5. Right hilar  lymphadenopathy, suspicious for metastatic disease. 6. Subtle hypodensity within hepatic segment 6 measures 4.0 x 3.2 cm, incompletely characterized. Recommend further evaluation with nonemergent contrast-enhanced MRI abdomen. 7.  Emphysema (ICD10-J43.9). Electronically Signed   By: Agustin Cree M.D.   On: 12/08/2023 14:42   DG Chest Portable 1 View Result Date: 12/08/2023 CLINICAL DATA:  Hemoptysis. EXAM: PORTABLE CHEST 1 VIEW COMPARISON:  04/16/2021 FINDINGS: Heart size is normal. Large area of poorly defined airspace opacity is seen in the right upper lobe, which could be due to pneumonia or neoplasm. Left lung is clear. No pleural effusion. IMPRESSION: Large area of poorly defined airspace opacity in right upper lobe, which could be due to pneumonia or neoplasm. Clinical correlation is recommended. Consider follow-up chest radiographs or CT. Electronically Signed   By: Danae Orleans M.D.   On: 12/08/2023 10:33    Procedures Procedures    Medications Ordered in ED Medications  vancomycin (VANCOREADY) IVPB 1250 mg/250 mL (has no administration in time range)  aluminum-magnesium hydroxide 200-200 MG/5ML suspension 15 mL (has no administration in time range)  benzocaine (ORAJEL) 10 % mucosal gel 1 Application (has no administration in time range)  Vitamin D3 CAPS  5,000 Units (has no administration in time range)  B-12 CAPS 3,000 mcg (has no administration in time range)  diphenhydrAMINE (BENADRYL) tablet 25 mg (has no administration in time range)  insulin detemir (LEVEMIR) FlexPen 4 Units (has no administration in time range)  levothyroxine (SYNTHROID) tablet 112 mcg (has no administration in time range)  linaclotide (LINZESS) capsule 145 mcg (has no administration in time range)  Magnesium CAPS 400 mg (has no administration in time range)  Melatonin 10-10 MG TBCR 10 mg (has no administration in time range)  methylphenidate (CONCERTA) CR tablet 36 mg (has no administration in time range)  pantoprazole (PROTONIX) EC tablet 40 mg (has no administration in time range)  polyethylene glycol powder (GLYCOLAX/MIRALAX) container 17 g (has no administration in time range)  ceFEPIme (MAXIPIME) 2 g in sodium chloride 0.9 % 100 mL IVPB (0 g Intravenous Stopped 12/08/23 1308)  iohexol (OMNIPAQUE) 350 MG/ML injection 75 mL (75 mLs Intravenous Contrast Given 12/08/23 1346)    ED Course/ Medical Decision Making/ A&P                                 Medical Decision Making Amount and/or Complexity of Data Reviewed Labs: ordered. Radiology: ordered.  Risk Prescription drug management. Decision regarding hospitalization.   Ashli R Borjon is here with hemoptysis and cough.  History of Sjogren's on Plaquenil.  Worsening cough now with coughing up of blood clots several times overnight.  She denies any fevers or chills.  She was supposed to get rheumatological blood work done here recently and asking for me to order those in addition to working up her cough today.  She denies any recent surgery or travel.  She had thyroid issues in the past.  No blood clot history.  She denies any weakness numbness tingling.  Nothing is made it worse or better.  She recently just started Plaquenil after being on steroids for a long time.  Differential diagnosis could be blood clot  versus pneumonia versus vasculitis process.  She is not on any blood thinners.  She has not had any black or bloody stools.  She has not vomited any blood.  Overall we will get CBC CMP plus inflammatory markers/rheumatological labs.  Chest x-ray will  also be obtained as well as COVID and flu testing.  Chest x-ray shows a fairly consolidated pneumonia in the right lung.  Will get a CT scan to further evaluate for PE infectious process.  Will add blood cultures lactic acid given that she is on Plaquenil.  Patient on CT scan per radiology report appears to have large lung mass 10 x 10 cm.  There appears to be a small area of pulmonary artery abruptly cut off but likely from mass effect.  There is no evidence of blood clot otherwise.  Will hold on anticoagulation given her hemoptysis.  Overall pulmonology team has been consulted Dr. Celine Mans and will make final recommendations regarding that as well as likely bronchoscopy.  Patient to be admitted to medicine service for further care.  IV antibiotics have been ordered.  Will start some fluids.  This chart was dictated using voice recognition software.  Despite best efforts to proofread,  errors can occur which can change the documentation meaning.         Final Clinical Impression(s) / ED Diagnoses Final diagnoses:  Lung mass  Pneumonia due to infectious organism, unspecified laterality, unspecified part of lung    Rx / DC Orders ED Discharge Orders     None         Virgina Norfolk, DO 12/08/23 1517

## 2023-12-09 ENCOUNTER — Encounter (HOSPITAL_COMMUNITY): Payer: Self-pay | Admitting: Internal Medicine

## 2023-12-09 DIAGNOSIS — J439 Emphysema, unspecified: Secondary | ICD-10-CM | POA: Diagnosis not present

## 2023-12-09 DIAGNOSIS — Z87891 Personal history of nicotine dependence: Secondary | ICD-10-CM | POA: Diagnosis not present

## 2023-12-09 DIAGNOSIS — R918 Other nonspecific abnormal finding of lung field: Secondary | ICD-10-CM | POA: Diagnosis not present

## 2023-12-09 LAB — HIV ANTIBODY (ROUTINE TESTING W REFLEX): HIV Screen 4th Generation wRfx: NONREACTIVE

## 2023-12-09 LAB — GLUCOSE, CAPILLARY
Glucose-Capillary: 125 mg/dL — ABNORMAL HIGH (ref 70–99)
Glucose-Capillary: 129 mg/dL — ABNORMAL HIGH (ref 70–99)
Glucose-Capillary: 129 mg/dL — ABNORMAL HIGH (ref 70–99)
Glucose-Capillary: 155 mg/dL — ABNORMAL HIGH (ref 70–99)
Glucose-Capillary: 211 mg/dL — ABNORMAL HIGH (ref 70–99)
Glucose-Capillary: 231 mg/dL — ABNORMAL HIGH (ref 70–99)
Glucose-Capillary: 64 mg/dL — ABNORMAL LOW (ref 70–99)
Glucose-Capillary: 69 mg/dL — ABNORMAL LOW (ref 70–99)

## 2023-12-09 LAB — CBC
HCT: 33.4 % — ABNORMAL LOW (ref 36.0–46.0)
Hemoglobin: 10.9 g/dL — ABNORMAL LOW (ref 12.0–15.0)
MCH: 29.7 pg (ref 26.0–34.0)
MCHC: 32.6 g/dL (ref 30.0–36.0)
MCV: 91 fL (ref 80.0–100.0)
Platelets: 422 10*3/uL — ABNORMAL HIGH (ref 150–400)
RBC: 3.67 MIL/uL — ABNORMAL LOW (ref 3.87–5.11)
RDW: 14.2 % (ref 11.5–15.5)
WBC: 12.3 10*3/uL — ABNORMAL HIGH (ref 4.0–10.5)
nRBC: 0 % (ref 0.0–0.2)

## 2023-12-09 LAB — STREP PNEUMONIAE URINARY ANTIGEN: Strep Pneumo Urinary Antigen: NEGATIVE

## 2023-12-09 LAB — COMPREHENSIVE METABOLIC PANEL
ALT: 16 U/L (ref 0–44)
AST: 23 U/L (ref 15–41)
Albumin: 3.2 g/dL — ABNORMAL LOW (ref 3.5–5.0)
Alkaline Phosphatase: 108 U/L (ref 38–126)
Anion gap: 9 (ref 5–15)
BUN: 14 mg/dL (ref 6–20)
CO2: 24 mmol/L (ref 22–32)
Calcium: 8.6 mg/dL — ABNORMAL LOW (ref 8.9–10.3)
Chloride: 100 mmol/L (ref 98–111)
Creatinine, Ser: 0.44 mg/dL (ref 0.44–1.00)
GFR, Estimated: >60 mL/min (ref >60)
Glucose, Bld: 148 mg/dL — ABNORMAL HIGH (ref 70–99)
Potassium: 3.9 mmol/L (ref 3.5–5.1)
Sodium: 133 mmol/L — ABNORMAL LOW (ref 135–145)
Total Bilirubin: 0.7 mg/dL (ref 0.0–1.2)
Total Protein: 6.5 g/dL (ref 6.5–8.1)

## 2023-12-09 LAB — HEMOGLOBIN AND HEMATOCRIT, BLOOD
HCT: 34.1 % — ABNORMAL LOW (ref 36.0–46.0)
HCT: 36 % (ref 36.0–46.0)
Hemoglobin: 11.1 g/dL — ABNORMAL LOW (ref 12.0–15.0)
Hemoglobin: 11.4 g/dL — ABNORMAL LOW (ref 12.0–15.0)

## 2023-12-09 MED ORDER — GUAIFENESIN-DM 100-10 MG/5ML PO SYRP
10.0000 mL | ORAL_SOLUTION | ORAL | Status: DC | PRN
  Administered 2023-12-10 (×2): 10 mL via ORAL
  Filled 2023-12-09 (×2): qty 10

## 2023-12-09 MED ORDER — INSULIN GLARGINE-YFGN 100 UNIT/ML ~~LOC~~ SOLN
2.0000 [IU] | Freq: Two times a day (BID) | SUBCUTANEOUS | Status: DC
  Administered 2023-12-09: 2 [IU] via SUBCUTANEOUS
  Filled 2023-12-09 (×2): qty 0.02

## 2023-12-09 MED ORDER — DEXTROSE 50 % IV SOLN
INTRAVENOUS | Status: AC
  Administered 2023-12-09: 12.5 g via INTRAVENOUS
  Filled 2023-12-09: qty 50

## 2023-12-09 MED ORDER — DEXTROSE 50 % IV SOLN: 12.5000 g | INTRAVENOUS | Status: AC

## 2023-12-09 NOTE — Inpatient Diabetes Management (Signed)
Inpatient Diabetes Program Recommendations  AACE/ADA: New Consensus Statement on Inpatient Glycemic Control (2015)  Target Ranges:  Prepandial:   less than 140 mg/dL      Peak postprandial:   less than 180 mg/dL (1-2 hours)      Critically ill patients:  140 - 180 mg/dL   Lab Results  Component Value Date   GLUCAP 211 (H) 12/09/2023   HGBA1C 6.9 (H) 12/08/2023    Review of Glycemic Control  Latest Reference Range & Units 12/08/23 19:57 12/09/23 00:02 12/09/23 03:34 12/09/23 04:04 12/09/23 07:19 12/09/23 11:29  Glucose-Capillary 70 - 99 mg/dL 540 (H)  Novolog 2 units  155 (H)  Novolog 2 units  64 (L) 125 (H) 129 (H) 211 (H)  (H): Data is abnormally high (L): Data is abnormally low  Diabetes history: T1DM Outpatient Diabetes medications:  Levemir 4 units BID Humalog 0-7 units TID Dexcpm G7 Current orders for Inpatient glycemic control: Semglee 2 units BID. Novolog 0-9 units Q4H  Inpatient Diabetes Program Recommendations:    Mild low of 64 mg/dL early this morning.  Might consider:  Novolog 0-6 units Q4H (very sensitive scale)  Will continue to follow while inpatient.  Thank you, Dulce Sellar, MSN, CDCES Diabetes Coordinator Inpatient Diabetes Program [phone] (team pager from 8a-5p)

## 2023-12-09 NOTE — Progress Notes (Signed)
PROGRESS NOTE Mary Erickson  KGM:[id number] DOB: Nov 29, 1965 DOA: 12/08/2023 PCP: Barnie Mort, PA-C  Brief Narrative/Hospital Course: [id number] w/ Sjogren's syndrome, rheumatoid arthritis followed at [facility], hypothyroidism, type 1 diabetes, seizure disorder, migraine , previous SIBO, ADHD, chronic constipation, GERD, anemia, arthritis history of hepatitis B presented to the ED with coughing up blood since early morning 12/08/2023.  Patient has had chronic cough for a long time not on anticoagulants.  She has been having pain in her nose throat area, has been taking a lot of NSAIDs recently this is due to her Sjogren's syndrome.  She does complains of chronic leg swelling and has stable compression stocking has dyspnea on exertion.  Currently denies any chest pain nausea vomiting fever chills  In the KV:QQVZDG stable afebrile, labs with stable CMP lactic acid 0.8 mild leukocytosis 12.2 ESR 58 influenza COVID respiratory syncytial virus negative. LOV:FIEPP area of poorly defined airspace opacity in right upper lobe  CT angio chest:large greater than 9 cm mass transversing the right upper lobe fissure into the right middle lobe and involving the right pulmonary artery. This is highly concerning for primary lung malignancy, abrupt cut off of the right upper lobe apical pulmonary artery-mass effect or PE, perifissural groundglass opacity around the mass, emphysema, subtle hypodensity in the liver Patient was given vancomycin and cefepime blood culture ordered and admission requested. Admission was requested pulmonary was consulted input pending.     Subjective: Co dry cough worried she may start to cough up blood Wants to ambulate around. Overnight afebrile BP stable on 2 L nasal cannula Labs overall unchanged  Assessment and Plan: Principal Problem:   Mass of upper lobe of right lung RUL Lung mass  > 9 cm enveloping right pulmonary artery and crossing across the fissure into the  RML Emphysema with 50-pack-year smoking history quit 15 years ago Hemoptysis Peripheral groundglass opacity surrounding the mass infectious/inflammatory? Hypodensity in the hepatic segment: Finding concerning for lung malignancy, plan for bronchoscopy by [facility] 1/28.  Add antitussives Patient has indicated-she does not want CPR at any cost, okay with trial of intubation.  Sopping empiric antibiotics.  Watch for hemoptysis. patient is not sure if she wants aggressive treatment for cancer such as chemotherapy and radiation but may be open to immunotherapy.  She shares that she has endured significant medical burden with her longstanding chronic autoimmune disorder and diabetes    Sjogren's syndrome Rheumatoid arthritis: followed at [facility].  Patient reports she gets pain/itching nose/ear and different areas due to Sjogren's syndrome. off of a steroid . Cont Tylenol, recently she has been using " a lot of ibuprofen"   Hypothyroidism: cont home Synthroid.   Type 1 diabetes on long-term insulin: Hypoglycemia after 64, cut down Levemir to 8 units twice daily continue SSI. PTA on  long acting at 6 u bid and ssi Recent Labs  Lab 12/08/23 1812 12/08/23 1957 12/09/23 0002 12/09/23 0334 12/09/23 0404 12/09/23 0719  GLUCAP  --  181* 155* 64* 125* 129*  HGBA1C 6.9*  --   --   --   --   --     Seizure disorder/migraine/ADHD: continue her home Adderall.   Chronic constipation GERD Hx of SIBO: Cont PPI, Linzess.   Anemia: Hemoglobin stable.   Goals of care: Patient does not wish to receive CPR, agreeable for intubation if needed in the setting of her hemoptysis/procedure and for anything reversible. She understands the complex nature of her lung mass, she wishes to die with dignityAwaiting  further pulmonary input-possibly palliative care evaluation at some point  DVT prophylaxis: SCDs Start: 12/08/23 1525 Code Status:   Code Status: Do not attempt resuscitation (DNR) PRE-ARREST  INTERVENTIONS DESIRED Family Communication: plan of care discussed with patient at bedside. Patient status is: Remains hospitalized because of severity of illness Level of care: Stepdown   Dispo: The patient is from: home            Anticipated disposition: TBD Objective: Vitals last 24 hrs: Vitals:   12/09/23 0335 12/09/23 0400 12/09/23 0700 12/09/23 0802  BP:  (!) 105/55  128/60  Pulse:    77  Resp:  19  17  Temp: 98.4 F (36.9 C)  98 F (36.7 C)   TempSrc: Oral  Oral   SpO2:  98%  97%  Weight:      Height:       Weight change:   Physical Examination: General exam: alert awake,at baseline, older than stated age HEENT:Oral mucosa moist, Ear/Nose WNL grossly Respiratory system: Bilaterally clear BS,no use of accessory muscle Cardiovascular system: S1 & S2 +, No JVD. Gastrointestinal system: Abdomen soft,NT,ND, BS+ Nervous System: Alert, awake, moving all extremities,and following commands. Extremities: LE edema neg,distal peripheral pulses palpable and warm.  Skin: No rashes,no icterus. MSK: Normal muscle bulk,tone, power   Medications reviewed:  Scheduled Meds:  Chlorhexidine Gluconate Cloth  6 each Topical Daily   cholecalciferol  5,000 Units Oral Daily   cyanocobalamin  3,000 mcg Oral Daily   diphenhydrAMINE  25 mg Oral QHS   insulin aspart  0-9 Units Subcutaneous Q4H   insulin glargine-yfgn  4 Units Subcutaneous BID   levothyroxine  112 mcg Oral QAC breakfast   linaclotide  145 mcg Oral QAC breakfast   magnesium oxide  400 mg Oral Daily   melatonin  10 mg Oral QHS   methylphenidate  36 mg Oral q morning   pantoprazole  40 mg Oral Daily   saccharomyces boulardii  250 mg Oral BID   Continuous Infusions:  sodium chloride 40 mL/hr at 12/09/23 0400   vancomycin        Diet Order             Diet NPO time specified  Diet effective midnight           Diet Carb Modified Fluid consistency: Thin  Diet effective now                             Intake/Output Summary (Last 24 hours) at 12/09/2023 0933 Last data filed at 12/09/2023 0400 Gross per 24 hour  Intake 249.17 ml  Output --  Net 249.17 ml   Net IO Since Admission: 249.17 mL [12/09/23 0933]  Wt Readings from Last 3 Encounters:  12/08/23 61.7 kg  11/14/23 59.5 kg  07/24/23 59.4 kg     Unresulted Labs (From admission, onward)     Start     Ordered   12/09/23 0822  Fungitell Beta-D-Glucan  Once,   R       Question:  Specimen collection method  Answer:  Lab=Lab collect   12/09/23 0821   12/09/23 0500  Comprehensive metabolic panel  Daily,   R      12/08/23 1536   12/09/23 0500  CBC  Daily,   R      12/08/23 1536   12/08/23 1917  MRSA Next Gen by PCR, Nasal  Once,   R  12/08/23 1916   12/08/23 1700  Hemoglobin and hematocrit, blood  Now then every 8 hours,   R (with TIMED occurrences)      12/08/23 1536   12/08/23 1525  Legionella Pneumophila Serogp 1 Ur Ag  Once,   R        12/08/23 1536   12/08/23 1525  Strep pneumoniae urinary antigen  Once,   R        12/08/23 1536   12/08/23 1021  C4 complement  Once,   URGENT        12/08/23 1020   12/08/23 1021  Rheumatoid factor  Once,   URGENT        12/08/23 1020   12/08/23 1020  Protein Electro, Random Urine  Once,   URGENT        12/08/23 1020   12/08/23 1020  Protein electrophoresis, serum  Once,   URGENT        12/08/23 1020   12/08/23 1019  C3 complement  Once,   URGENT        12/08/23 1020   12/08/23 1019  CYCLIC CITRUL PEPTIDE ANTIBODY, IGG/IGA  Once,   URGENT        12/08/23 1020          Data Reviewed: I have personally reviewed following labs and imaging studies CBC: Recent Labs  Lab 12/08/23 1147 12/08/23 1812 12/09/23 0038 12/09/23 0855  WBC 12.2*  --  12.3*  --   NEUTROABS 9.7*  --   --   --   HGB 12.6 11.8* 10.9* 11.1*  HCT 39.2 37.4 33.4* 34.1*  MCV 91.0  --  91.0  --   PLT 422*  --  422*  --    Basic Metabolic Panel:  Recent Labs  Lab 12/08/23 1147 12/09/23 0038  NA  135 133*  K 4.2 3.9  CL 100 100  CO2 26 24  GLUCOSE 154* 148*  BUN 13 14  CREATININE 0.54 0.44  CALCIUM 9.9 8.6*   GFR: Estimated Creatinine Clearance: 75.6 mL/min (by C-G formula based on SCr of 0.44 mg/dL). Liver Function Tests:  Recent Labs  Lab 12/08/23 1147 12/09/23 0038  AST 31 23  ALT 20 16  ALKPHOS 141* 108  BILITOT 0.6 0.7  PROT 9.0* 6.5  ALBUMIN 4.2 3.2*   Recent Labs  Lab 12/08/23 1147  LIPASE 27   No results for input(s): "AMMONIA" in the last 168 hours. Coagulation Profile:  Recent Labs  Lab 12/08/23 1147  INR 0.9   No results for input(s): "PROBNP" in the last 168 hours.  Recent Labs    12/08/23 1812  HGBA1C 6.9*   Recent Labs  Lab 12/08/23 1957 12/09/23 0002 12/09/23 0334 12/09/23 0404 12/09/23 0719  GLUCAP 181* 155* 64* 125* 129*   No results for input(s): "CHOL", "HDL", "LDLCALC", "TRIG", "CHOLHDL", "LDLDIRECT" in the last 72 hours. No results for input(s): "TSH", "T4TOTAL", "FREET4", "T3FREE", "THYROIDAB" in the last 72 hours. Sepsis Labs: Recent Labs  Lab 12/08/23 1249  LATICACIDVEN 0.8   Recent Results (from the past 240 hours)  Resp panel by RT-PCR (RSV, Flu A&B, Covid) Anterior Nasal Swab     Status: None   Collection Time: 12/08/23 10:41 AM   Specimen: Anterior Nasal Swab  Result Value Ref Range Status   SARS Coronavirus 2 by RT PCR NEGATIVE NEGATIVE Final    Comment: (NOTE) SARS-CoV-2 target nucleic acids are NOT DETECTED.  The SARS-CoV-2 RNA is generally detectable in upper respiratory specimens  during the acute phase of infection. The lowest concentration of SARS-CoV-2 viral copies this assay can detect is 138 copies/mL. A negative result does not preclude SARS-Cov-2 infection and should not be used as the sole basis for treatment or other patient management decisions. A negative result may occur with  improper specimen collection/handling, submission of specimen other than nasopharyngeal swab, presence of viral  mutation(s) within the areas targeted by this assay, and inadequate number of viral copies(<138 copies/mL). A negative result must be combined with clinical observations, patient history, and epidemiological information. The expected result is Negative.  Fact Sheet for Patients:  BloggerCourse.com  Fact Sheet for Healthcare Providers:  SeriousBroker.it  This test is no t yet approved or cleared by the [place] FDA and  has been authorized for detection and/or diagnosis of SARS-CoV-2 by FDA under an Emergency Use Authorization (EUA). This EUA will remain  in effect (meaning this test can be used) for the duration of the COVID-19 declaration under Section 564(b)(1) of the Act, 21 U.S.C.section 360bbb-3(b)(1), unless the authorization is terminated  or revoked sooner.       Influenza A by PCR NEGATIVE NEGATIVE Final   Influenza B by PCR NEGATIVE NEGATIVE Final    Comment: (NOTE) The Xpert Xpress SARS-CoV-2/FLU/RSV plus assay is intended as an aid in the diagnosis of influenza from Nasopharyngeal swab specimens and should not be used as a sole basis for treatment. Nasal washings and aspirates are unacceptable for Xpert Xpress SARS-CoV-2/FLU/RSV testing.  Fact Sheet for Patients: BloggerCourse.com  Fact Sheet for Healthcare Providers: SeriousBroker.it  This test is not yet approved or cleared by the [place] FDA and has been authorized for detection and/or diagnosis of SARS-CoV-2 by FDA under an Emergency Use Authorization (EUA). This EUA will remain in effect (meaning this test can be used) for the duration of the COVID-19 declaration under Section 564(b)(1) of the Act, 21 U.S.C. section 360bbb-3(b)(1), unless the authorization is terminated or revoked.     Resp Syncytial Virus by PCR NEGATIVE NEGATIVE Final    Comment: (NOTE) Fact Sheet for  Patients: BloggerCourse.com  Fact Sheet for Healthcare Providers: SeriousBroker.it  This test is not yet approved or cleared by the [place] FDA and has been authorized for detection and/or diagnosis of SARS-CoV-2 by FDA under an Emergency Use Authorization (EUA). This EUA will remain in effect (meaning this test can be used) for the duration of the COVID-19 declaration under Section 564(b)(1) of the Act, 21 U.S.C. section 360bbb-3(b)(1), unless the authorization is terminated or revoked.  Performed at [facility], [address] [place], [place] [address]   Blood culture (routine x 2)     Status: None (Preliminary result)   Collection Time: 12/08/23 11:19 AM   Specimen: BLOOD  Result Value Ref Range Status   Specimen Description   Final    BLOOD SITE NOT SPECIFIED Performed at [facility], [address] [place], [place] [address]    Special Requests   Final    BOTTLES DRAWN AEROBIC AND ANAEROBIC Blood Culture results may not be optimal due to an inadequate volume of blood received in culture bottles Performed at [facility], [address], [place], [place] [address]    Culture   Final    NO GROWTH < 24 HOURS Performed at [facility], [address] [place], [place] [address]    Report Status PENDING  Incomplete  Blood culture (routine x 2)  Status: None (Preliminary result)   Collection Time: 12/08/23 11:30 AM   Specimen: BLOOD  Result Value Ref Range Status   Specimen Description   Final    BLOOD LEFT ANTECUBITAL Performed at [facility], [address] [place], [place] [address]    Special Requests   Final    BOTTLES DRAWN AEROBIC AND ANAEROBIC Blood Culture results may not be optimal due to an inadequate volume of blood received in culture bottles Performed at [facility], [address],  [place], [place] [address]    Culture   Final    NO GROWTH < 24 HOURS Performed at [facility], [address] [place], [place] [address]    Report Status PENDING  Incomplete    Antimicrobials/Microbiology: Anti-infectives (From admission, onward)    Start     Dose/Rate Route Frequency Ordered Stop   12/08/23 1800  cefTRIAXone (ROCEPHIN) 2 g in sodium chloride 0.9 % 100 mL IVPB  Status:  Discontinued        2 g 200 mL/hr over 30 Minutes Intravenous Every 24 hours 12/08/23 1536 12/08/23 1738   12/08/23 1600  azithromycin (ZITHROMAX) 500 mg in sodium chloride 0.9 % 250 mL IVPB  Status:  Discontinued        500 mg 250 mL/hr over 60 Minutes Intravenous Every 24 hours 12/08/23 1536 12/08/23 1738   12/08/23 1200  vancomycin (VANCOREADY) IVPB 1250 mg/250 mL        1,250 mg 166.7 mL/hr over 90 Minutes Intravenous  Once 12/08/23 1108     12/08/23 1115  ceFEPIme (MAXIPIME) 2 g in sodium chloride 0.9 % 100 mL IVPB        2 g 200 mL/hr over 30 Minutes Intravenous  Once 12/08/23 1108 12/08/23 1308         Component Value Date/Time   SDES  12/08/2023 1130    BLOOD LEFT ANTECUBITAL Performed at [facility], [address], [place], [place] [address]    SPECREQUEST  12/08/2023 1130    BOTTLES DRAWN AEROBIC AND ANAEROBIC Blood Culture results may not be optimal due to an inadequate volume of blood received in culture bottles Performed at [facility], [address] [place], [place] [address]    CULT  12/08/2023 1130    NO GROWTH < 24 HOURS Performed at [facility], [address] [place], [place] [address]    REPTSTATUS PENDING 12/08/2023 1130     Radiology Studies: CT Angio Chest PE W and/or Wo Contrast Result Date: 12/08/2023 CLINICAL DATA:  Hemoptysis with right upper chest opacity on same day chest radiograph EXAM: CT ANGIOGRAPHY CHEST WITH CONTRAST TECHNIQUE: Multidetector CT imaging of the chest was performed using the standard  protocol during bolus administration of intravenous contrast. Multiplanar CT image reconstructions and MIPs were obtained to evaluate the vascular anatomy. RADIATION DOSE REDUCTION: This exam was performed according to the departmental dose-optimization program which includes automated exposure control, adjustment of the mA and/or kV according to patient size and/or use of iterative reconstruction technique. CONTRAST:  75mL OMNIPAQUE IOHEXOL 350 MG/ML SOLN COMPARISON:  Same day chest radiograph FINDINGS: Cardiovascular: The study is high quality for the evaluation of pulmonary embolism. There is abrupt cut off of right upper lobe apical subsegmental pulmonary artery (10:132). There are otherwise no filling defects in the central, lobar, segmental or subsegmental pulmonary artery branches to suggest acute pulmonary embolism. Great vessels are normal in course and caliber. Normal  heart size. No significant pericardial fluid/thickening. Mediastinum/Nodes: Thyroidectomy. Normal esophagus. 18 mm right hilar lymphadenopathy (4:66). Left axillary surgical clips. Lungs/Pleura: The central airways are patent. Moderate effacement of right upper lobe bronchus and bronchials with suspected complete obstruction of at least 1 segmental airway (12:57) secondary to consolidative mass centered within the posterior right upper lobe measuring 9.9 x 9.9 cm (12:57). This mass crosses the major fissure into the superior segment right lower lobe and extends towards the hilum. There is peripheral ground-glass opacity surrounding the mass with irregular nodule in the right apex measuring 1.1 x 0.5 cm (12:22). Mild upper lobe predominant centrilobular emphysema. Subsegmental right middle lobe and lingular atelectasis. No pneumothorax. No pleural effusion. Upper abdomen: Subtle hypodensity within hepatic segment 6 measures 4.0 x 3.2 cm (4:151). Musculoskeletal: No acute or abnormal lytic or blastic osseous lesions. Review of the MIP images  confirms the above findings. IMPRESSION: 1. Large consolidative mass centered within the posterior right upper lobe measuring 9.9 x 9.9 cm, crossing the major fissure into the superior segment right lower lobe and extending towards the hilum, highly suspicious for primary lung malignancy. 2. Abrupt cut off of right upper lobe apical subsegmental pulmonary artery, which may be secondary to mass effect or pulmonary embolism. No other filling defects in the central, lobar, segmental or subsegmental pulmonary artery branches to suggest acute pulmonary embolism. 3. Effacement and obstruction of at least 1 right upper lobe bronchiole by the mass. 4. Peripheral ground-glass opacity surrounding the mass with irregular nodule in the right apex measuring 1.1 x 0.5 cm, which may be infectious/inflammatory or reflect satellite nodule. 5. Right hilar lymphadenopathy, suspicious for metastatic disease. 6. Subtle hypodensity within hepatic segment 6 measures 4.0 x 3.2 cm, incompletely characterized. Recommend further evaluation with nonemergent contrast-enhanced MRI abdomen. 7.  Emphysema (ICD10-J43.9). Electronically Signed   By: Agustin Cree M.D.   On: 12/08/2023 14:42   DG Chest Portable 1 View Result Date: 12/08/2023 CLINICAL DATA:  Hemoptysis. EXAM: PORTABLE CHEST 1 VIEW COMPARISON:  04/16/2021 FINDINGS: Heart size is normal. Large area of poorly defined airspace opacity is seen in the right upper lobe, which could be due to pneumonia or neoplasm. Left lung is clear. No pleural effusion. IMPRESSION: Large area of poorly defined airspace opacity in right upper lobe, which could be due to pneumonia or neoplasm. Clinical correlation is recommended. Consider follow-up chest radiographs or CT. Electronically Signed   By: Danae Orleans M.D.   On: 12/08/2023 10:33    LOS: 1 day   Total time spent in review of labs and imaging, patient evaluation, formulation of plan, documentation and communication with family: 35  minutes  Lanae Boast, MD  [facility]  12/09/2023, 9:33 AM

## 2023-12-09 NOTE — Plan of Care (Signed)

## 2023-12-09 NOTE — TOC Initial Note (Signed)
Transition of Care [facility]) - Initial/Assessment Note    Patient Details  Name: Mary Erickson MRN: [id number] Date of Birth: 27-Jan-1966  Transition of Care [facility]) CM/SW Contact:    Howell Rucks, RN Phone Number: 12/09/2023, 8:25 AM  Clinical Narrative:    Met with pt at bedside to introduce role of TOC/NCM and review for dc planning, pt reports she has an established PCP and pharmacy, reports no current home care services or home DME, reports she lives alone with good support system from relatives/friends, confirmed transportation is available at discharge. TOC consult for Medication Assistance: pt reports she sometimes has difficulty affording her OTC meds but she has applied for a special program for assistance. TOC will continue to follow.               Expected Discharge Plan: Home/Self Care Barriers to Discharge: Continued Medical Work up   Patient Goals and CMS Choice Patient states their goals for this hospitalization and ongoing recovery are:: return home          Expected Discharge Plan and Services       Living arrangements for the past 2 months: Apartment                                      Prior Living Arrangements/Services Living arrangements for the past 2 months: Apartment Lives with:: Self Patient language and need for interpreter reviewed:: Yes Do you feel safe going back to the place where you live?: Yes      Need for Family Participation in Patient Care: Yes (Comment) Care giver support system in place?: Yes (comment)   Criminal Activity/Legal Involvement Pertinent to Current Situation/Hospitalization: No - Comment as needed  Activities of Daily Living   ADL Screening (condition at time of admission) Independently performs ADLs?: Yes (appropriate for developmental age) Is the patient deaf or have difficulty hearing?: No Does the patient have difficulty seeing, even when wearing glasses/contacts?: No Does the patient have difficulty  concentrating, remembering, or making decisions?: No  Permission Sought/Granted                  Emotional Assessment Appearance:: Appears stated age Attitude/Demeanor/Rapport: Gracious Affect (typically observed): Accepting Orientation: : Oriented to Self, Oriented to Place, Oriented to  Time, Oriented to Situation Alcohol / Substance Use: Not Applicable Psych Involvement: No (comment)  Admission diagnosis:  Lung mass [R91.8] Mass of upper lobe of right lung [R91.8] Pneumonia due to infectious organism, unspecified laterality, unspecified part of lung [J18.9] Patient Active Problem List   Diagnosis Date Noted   Mass of upper lobe of right lung 12/08/2023   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   Goiter, toxic, multinodular 02/24/2023   Hyponatremia 04/17/2021   IBS (irritable bowel syndrome) 04/17/2021   Dizziness 12/11/2019   Colitis 12/11/2019   Abnormal CT scan, colon    Acute colitis 12/08/2019   Orthostatic hypotension 12/08/2019   Type 1 diabetes mellitus (HCC) 06/30/2019   Hashimoto's disease 06/30/2019   Sjogren's syndrome (HCC) 06/30/2019   Hx of Hypertension 06/30/2019   Neurogenic bladder 06/30/2019   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 [place] [address] Phone: [phone] Fax: [phone]  [id number] - [place], [place] - [address] AT [facility] [address] [place] [address] Phone: [phone] Fax: [phone]     Social Drivers of Health (SDOH) Social History: SDOH Screenings   Food Insecurity: No Food Insecurity (12/08/2023)  Housing: Low Risk  (12/08/2023)  Transportation Needs: No Transportation Needs  (12/08/2023)  Utilities: Not At Risk (12/08/2023)  Depression (PHQ2-9): Medium Risk (06/30/2019)  Financial Resource Strain: Patient Declined (08/13/2023)   Received from [facility]  Physical Activity: Unknown (08/13/2023)   Received from [facility]  Social Connections: Unknown (07/26/2023)   [facility] from [facility]  Stress: Patient Declined (08/13/2023)   Received from [facility]  Tobacco Use: Medium Risk (12/09/2023)   SDOH Interventions:     Readmission Risk Interventions    12/09/2023    8:24 AM  Readmission Risk Prevention Plan  Transportation Screening Complete  PCP or Specialist Appt within 5-7 Days Complete  Home Care Screening Complete  Medication Review (RN CM) Complete

## 2023-12-09 NOTE — Consult Note (Signed)
NAME:  TAMEAKA EICHHORN, MRN:  [id number], DOB:  01-Dec-1965, LOS: 1 ADMISSION DATE:  12/08/2023, CONSULTATION DATE:  12/09/2023 REFERRING MD:  Lanae Boast, MD, CHIEF COMPLAINT:  lung mass  History of Present Illness:  Mary Erickson is a 58 year old woman with past medical history of Sjogren's, type 1 diabetes mellitus, tobacco use disorder quit in 2010, who presents with cough and hemoptysis.  She notes having thyroid surgery in April 2024 and a couple of days after that she started having a nagging cough.  She notes the cough has been pretty persistent over the last year but yesterday in the middle of the night she coughed up a teaspoon of bright red blood.  She notes that her cough is worse with any kind of exertion or movement.  She had worsening hemoptysis which is what caused her to present to the ED.  She denies fevers chills night sweats but does note she has a trouble gaining weight over the last few months.  She had a chest x-ray which showed a opacification of the right upper lobe which led to CT scan which shows a large greater than 9 cm mass transversing the right upper lobe fissure into the right middle lobe and involving the right pulmonary artery.  This is highly concerning for primary lung malignancy.  She notes being chronically ill for much of her life with her autoimmune disorders and has seen family members in the healthcare setting.  She does not want to undergo chest compressions under any circumstances but would be open to intubation for reversible treatable causes.  She is not sure she wants aggressive treatment for cancer such as chemotherapy and radiation but may be open to immunotherapy.  Regardless she is open to biopsy options including bronchoscopy if necessary.  PCCM was consulted to assist in management of the pulmonary mass.  Pertinent  Medical History  Sjogren's Type 1 DM Emphysema Quit smoking 2010, 50 pack year smoker with passive smoke exposure  Significant  Hospital Events: Including procedures, antibiotic start and stop dates in addition to other pertinent events   1/26 admitted for hemoptysis  Interim History / Subjective:   No acute events overnight No further episodes of hemoptysis Continues to have episodes of dry coughing  Objective   Blood pressure (!) 105/55, pulse 66, temperature 98.4 F (36.9 C), temperature source Oral, resp. rate 19, height 5\' 8"  (1.727 m), weight 61.7 kg, SpO2 98%.        Intake/Output Summary (Last 24 hours) at 12/09/2023 0741 Last data filed at 12/09/2023 0400 Gross per 24 hour  Intake 249.17 ml  Output --  Net 249.17 ml   Filed Weights   12/08/23 0933  Weight: 61.7 kg    Examination: General: No acute distress HENT: [place]/AT, moist mucous membranes Lungs: Breath sound diminished right lung otherwise breathing nonlabored no wheeze Cardiovascular: Regular rate and rhythm Abdomen: Soft,  nondistended Extremities: No peripheral edema, diffuse arthritic changes in upper EXTR Neuro: Normal speech no focal asymmetry  Resolved Hospital Problem list     Assessment & Plan:   Right upper lobe lung mass greater than 9 cm enveloping the right pulmonary artery and crossing across the fissure into the right middle lobe Emphysema with 50-pack-year smoking history, quit 15 years ago  Plan - bronchosocpy scheduled for tomorrow - NPO at midnight tonight - ok for diet today   Labs   CBC: Recent Labs  Lab 12/08/23 1147 12/08/23 1812 12/09/23 0038  WBC 12.2*  --  12.3*  NEUTROABS 9.7*  --   --   HGB 12.6 11.8* 10.9*  HCT 39.2 37.4 33.4*  MCV 91.0  --  91.0  PLT 422*  --  422*    Basic Metabolic Panel: Recent Labs  Lab 12/08/23 1147 12/09/23 0038  NA 135 133*  K 4.2 3.9  CL 100 100  CO2 26 24  GLUCOSE 154* 148*  BUN 13 14  CREATININE 0.54 0.44  CALCIUM 9.9 8.6*   GFR: Estimated Creatinine Clearance: 75.6 mL/min (by C-G formula based on SCr of 0.44 mg/dL). Recent Labs  Lab  12/08/23 1147 12/08/23 1249 12/09/23 0038  WBC 12.2*  --  12.3*  LATICACIDVEN  --  0.8  --     Liver Function Tests: Recent Labs  Lab 12/08/23 1147 12/09/23 0038  AST 31 23  ALT 20 16  ALKPHOS 141* 108  BILITOT 0.6 0.7  PROT 9.0* 6.5  ALBUMIN 4.2 3.2*   Recent Labs  Lab 12/08/23 1147  LIPASE 27   No results for input(s): "AMMONIA" in the last 168 hours.  ABG    Component Value Date/Time   TCO2 27 12/07/2019 0607     Coagulation Profile: Recent Labs  Lab 12/08/23 1147  INR 0.9    Cardiac Enzymes: No results for input(s): "CKTOTAL", "CKMB", "CKMBINDEX", "TROPONINI" in the last 168 hours.  HbA1C: Hgb A1c MFr Bld  Date/Time Value Ref Range Status  12/08/2023 06:12 PM 6.9 (H) 4.8 - 5.6 % Final    Comment:    (NOTE) Pre diabetes:          5.7%-6.4%  Diabetes:              >6.4%  Glycemic control for   <7.0% adults with diabetes   03/01/2023 11:53 AM 6.9 (H) 4.8 - 5.6 % Final    Comment:    (NOTE) Pre diabetes:          5.7%-6.4%  Diabetes:              >6.4%  Glycemic control for   <7.0% adults with diabetes     CBG: Recent Labs  Lab 12/08/23 1957 12/09/23 0002 12/09/23 0334 12/09/23 0404 12/09/23 0719  GLUCAP 181* 155* 64* 125* 129*   Melody Comas, MD  Pulmonary & Critical Care Office: [phone]   See Amion for personal pager PCCM on call pager [phone] until 7pm. Please call Elink 7p-7a. [phone]

## 2023-12-09 NOTE — Plan of Care (Signed)
  Problem: Education: Goal: Ability to describe self-care measures that may prevent or decrease complications (Diabetes Survival Skills Education) will improve Outcome: Progressing   Problem: Coping: Goal: Ability to adjust to condition or change in health will improve Outcome: Progressing   Problem: Fluid Volume: Goal: Ability to maintain a balanced intake and output will improve Outcome: Progressing   Problem: Nutritional: Goal: Progress toward achieving an optimal weight will improve Outcome: Progressing   Problem: Clinical Measurements: Goal: Will remain free from infection Outcome: Progressing   Problem: Nutrition: Goal: Adequate nutrition will be maintained Outcome: Progressing   Problem: Pain Managment: Goal: General experience of comfort will improve and/or be controlled Outcome: Progressing   Problem: Respiratory: Goal: Ability to maintain adequate ventilation will improve Outcome: Progressing

## 2023-12-10 ENCOUNTER — Encounter (HOSPITAL_COMMUNITY)
Admission: EM | Disposition: A | Discharge status: Home / Self Care | Payer: Self-pay | Source: Home / Self Care | Attending: Internal Medicine

## 2023-12-10 ENCOUNTER — Inpatient Hospital Stay (HOSPITAL_COMMUNITY): Payer: Medicaid Other

## 2023-12-10 ENCOUNTER — Inpatient Hospital Stay (HOSPITAL_COMMUNITY): Payer: Medicaid Other | Admitting: Anesthesiology

## 2023-12-10 ENCOUNTER — Encounter (HOSPITAL_COMMUNITY): Payer: Self-pay | Admitting: Internal Medicine

## 2023-12-10 DIAGNOSIS — R918 Other nonspecific abnormal finding of lung field: Secondary | ICD-10-CM | POA: Diagnosis not present

## 2023-12-10 HISTORY — PX: FINE NEEDLE ASPIRATION: SHX5430

## 2023-12-10 HISTORY — PX: ENDOBRONCHIAL ULTRASOUND: SHX5096

## 2023-12-10 HISTORY — PX: VIDEO BRONCHOSCOPY: SHX5072

## 2023-12-10 HISTORY — PX: BRONCHIAL WASHINGS: SHX5105

## 2023-12-10 LAB — COMPREHENSIVE METABOLIC PANEL
ALT: 15 U/L (ref 0–44)
AST: 25 U/L (ref 15–41)
Albumin: 3 g/dL — ABNORMAL LOW (ref 3.5–5.0)
Alkaline Phosphatase: 105 U/L (ref 38–126)
Anion gap: 10 (ref 5–15)
BUN: 13 mg/dL (ref 6–20)
CO2: 21 mmol/L — ABNORMAL LOW (ref 22–32)
Calcium: 8.8 mg/dL — ABNORMAL LOW (ref 8.9–10.3)
Chloride: 99 mmol/L (ref 98–111)
Creatinine, Ser: 0.84 mg/dL (ref 0.44–1.00)
GFR, Estimated: >60 mL/min (ref >60)
Glucose, Bld: 82 mg/dL (ref 70–99)
Potassium: 3.9 mmol/L (ref 3.5–5.1)
Sodium: 130 mmol/L — ABNORMAL LOW (ref 135–145)
Total Bilirubin: 0.7 mg/dL (ref 0.0–1.2)
Total Protein: 6.8 g/dL (ref 6.5–8.1)

## 2023-12-10 LAB — CBC
HCT: 33.8 % — ABNORMAL LOW (ref 36.0–46.0)
Hemoglobin: 10.8 g/dL — ABNORMAL LOW (ref 12.0–15.0)
MCH: 29.4 pg (ref 26.0–34.0)
MCHC: 32 g/dL (ref 30.0–36.0)
MCV: 92.1 fL (ref 80.0–100.0)
Platelets: 396 10*3/uL (ref 150–400)
RBC: 3.67 MIL/uL — ABNORMAL LOW (ref 3.87–5.11)
RDW: 14.1 % (ref 11.5–15.5)
WBC: 10.8 10*3/uL — ABNORMAL HIGH (ref 4.0–10.5)
nRBC: 0 % (ref 0.0–0.2)

## 2023-12-10 LAB — GLUCOSE, CAPILLARY
Glucose-Capillary: 63 mg/dL — ABNORMAL LOW (ref 70–99)
Glucose-Capillary: 174 mg/dL — ABNORMAL HIGH (ref 70–99)
Glucose-Capillary: 173 mg/dL — ABNORMAL HIGH (ref 70–99)
Glucose-Capillary: 146 mg/dL — ABNORMAL HIGH (ref 70–99)
Glucose-Capillary: 125 mg/dL — ABNORMAL HIGH (ref 70–99)
Glucose-Capillary: 148 mg/dL — ABNORMAL HIGH (ref 70–99)
Glucose-Capillary: 94 mg/dL (ref 70–99)
Glucose-Capillary: 129 mg/dL — ABNORMAL HIGH (ref 70–99)
Glucose-Capillary: 187 mg/dL — ABNORMAL HIGH (ref 70–99)

## 2023-12-10 LAB — C3 COMPLEMENT: C3 Complement: 176 mg/dL — ABNORMAL HIGH (ref 82–167)

## 2023-12-10 LAB — RHEUMATOID FACTOR: Rheumatoid fact SerPl-aCnc: 13.8 [IU]/mL (ref <14.0)

## 2023-12-10 LAB — CYCLIC CITRUL PEPTIDE ANTIBODY, IGG/IGA: CCP Antibodies IgG/IgA: 3 U (ref 0–19)

## 2023-12-10 LAB — C4 COMPLEMENT: Complement C4, Body Fluid: 26 mg/dL (ref 12–38)

## 2023-12-10 SURGERY — VIDEO BRONCHOSCOPY WITHOUT FLUORO
Anesthesia: General

## 2023-12-10 MED ORDER — FENTANYL CITRATE (PF) 100 MCG/2ML IJ SOLN
INTRAMUSCULAR | Status: DC | PRN
  Administered 2023-12-10: 50 ug via INTRAVENOUS

## 2023-12-10 MED ORDER — CHLORHEXIDINE GLUCONATE 0.12 % MT SOLN
OROMUCOSAL | Status: AC
  Filled 2023-12-10: qty 15

## 2023-12-10 MED ORDER — SODIUM CHLORIDE 0.9 % IV SOLN: INTRAVENOUS | Status: DC | PRN

## 2023-12-10 MED ORDER — ONDANSETRON HCL 4 MG/2ML IJ SOLN
4.0000 mg | Freq: Four times a day (QID) | INTRAMUSCULAR | Status: DC | PRN
  Administered 2023-12-10: 4 mg via INTRAVENOUS
  Filled 2023-12-10: qty 2

## 2023-12-10 MED ORDER — CHLORHEXIDINE GLUCONATE 0.12 % MT SOLN
15.0000 mL | Freq: Once | OROMUCOSAL | Status: AC
  Administered 2023-12-10: 15 mL via OROMUCOSAL

## 2023-12-10 MED ORDER — ONDANSETRON HCL 4 MG/2ML IJ SOLN
INTRAMUSCULAR | Status: DC | PRN
  Administered 2023-12-10: 4 mg via INTRAVENOUS

## 2023-12-10 MED ORDER — INSULIN ASPART 100 UNIT/ML IJ SOLN
0.0000 [IU] | Freq: Every day | INTRAMUSCULAR | Status: DC
  Administered 2023-12-11: 2 [IU] via SUBCUTANEOUS

## 2023-12-10 MED ORDER — SUGAMMADEX SODIUM 200 MG/2ML IV SOLN
INTRAVENOUS | Status: DC | PRN
  Administered 2023-12-10: 200 mg via INTRAVENOUS

## 2023-12-10 MED ORDER — PHENOL 1.4 % MT LIQD
1.0000 | OROMUCOSAL | Status: DC | PRN
  Administered 2023-12-10: 1 via OROMUCOSAL
  Filled 2023-12-10: qty 177

## 2023-12-10 MED ORDER — DEXTROSE 50 % IV SOLN
12.5000 g | INTRAVENOUS | Status: AC
  Administered 2023-12-10: 12.5 g via INTRAVENOUS

## 2023-12-10 MED ORDER — FENTANYL CITRATE (PF) 100 MCG/2ML IJ SOLN
INTRAMUSCULAR | Status: AC
  Filled 2023-12-10: qty 2

## 2023-12-10 MED ORDER — PROPOFOL 10 MG/ML IV BOLUS
INTRAVENOUS | Status: DC | PRN
  Administered 2023-12-10: 200 mg via INTRAVENOUS

## 2023-12-10 MED ORDER — MIDAZOLAM HCL 2 MG/2ML IJ SOLN
INTRAMUSCULAR | Status: AC
  Filled 2023-12-10: qty 2

## 2023-12-10 MED ORDER — MIDAZOLAM HCL 5 MG/5ML IJ SOLN
INTRAMUSCULAR | Status: DC | PRN
  Administered 2023-12-10: 2 mg via INTRAVENOUS

## 2023-12-10 MED ORDER — ROCURONIUM BROMIDE 100 MG/10ML IV SOLN
INTRAVENOUS | Status: DC | PRN
  Administered 2023-12-10: 50 mg via INTRAVENOUS

## 2023-12-10 MED ORDER — DEXTROSE 50 % IV SOLN
INTRAVENOUS | Status: AC
  Filled 2023-12-10: qty 50

## 2023-12-10 MED ORDER — INSULIN ASPART 100 UNIT/ML IJ SOLN
0.0000 [IU] | Freq: Three times a day (TID) | INTRAMUSCULAR | Status: DC
Start: 2023-12-11 — End: 2023-12-12
  Administered 2023-12-11: 2 [IU] via SUBCUTANEOUS
  Administered 2023-12-11: 3 [IU] via SUBCUTANEOUS
  Administered 2023-12-12: 9 [IU] via SUBCUTANEOUS

## 2023-12-10 MED ORDER — LIDOCAINE HCL (CARDIAC) PF 100 MG/5ML IV SOSY
PREFILLED_SYRINGE | INTRAVENOUS | Status: DC | PRN
  Administered 2023-12-10: 8 mg via INTRATRACHEAL

## 2023-12-10 NOTE — Progress Notes (Addendum)
2000 scheduled insulin not given. Pt took 5 units of her own insulin from home. When questioned about it the patient said "I prefer to take my own". Pt was informed that she was only due to take 3 units at the time. Pt then replied "I like to take a letter extra incase I want a snack later". Pt educated on the dangers of administering too much insulin. Scheduled insulin documented as "Not Given".

## 2023-12-10 NOTE — Anesthesia Postprocedure Evaluation (Signed)
Anesthesia Post Note  Patient: Mary Erickson  Procedure(s) Performed: VIDEO BRONCHOSCOPY WITHOUT FLUORO ENDOBRONCHIAL ULTRASOUND FINE NEEDLE ASPIRATION (FNA) LINEAR BRONCHIAL WASHINGS     Patient location during evaluation: PACU Anesthesia Type: General Level of consciousness: awake and alert Pain management: pain level controlled Vital Signs Assessment: post-procedure vital signs reviewed and stable Respiratory status: spontaneous breathing, nonlabored ventilation, respiratory function stable and patient connected to nasal cannula oxygen Cardiovascular status: blood pressure returned to baseline and stable Postop Assessment: no apparent nausea or vomiting Anesthetic complications: no  No notable events documented.  Last Vitals:  Vitals:   12/10/23 1450 12/10/23 1500  BP: (!) 112/48 (!) 110/54  Pulse: 68 66  Resp: (!) 23 20  Temp:    SpO2: 90% 94%    Last Pain:  Vitals:   12/10/23 1500  TempSrc:   PainSc: 6                  Shelton Silvas

## 2023-12-10 NOTE — Progress Notes (Signed)
PROGRESS NOTE Mary Erickson  ZOX:[id number] DOB: 03/21/1966 DOA: 12/08/2023 PCP: Barnie Mort, PA-C  Brief Narrative/Hospital Course: [phone] w/ Sjogren's syndrome, rheumatoid arthritis followed at [facility], hypothyroidism, type 1 diabetes, seizure disorder, migraine , previous SIBO, ADHD, chronic constipation, GERD, anemia, arthritis history of hepatitis B presented to the ED with coughing up blood since early morning 12/08/2023.  Patient has had chronic cough for a long time not on anticoagulants.  She has been having pain in her nose throat area, has been taking a lot of NSAIDs recently this is due to her Sjogren's syndrome.  She does complains of chronic leg swelling and has stable compression stocking has dyspnea on exertion.  Currently denies any chest pain nausea vomiting fever chills  In the YN:WGNFAO stable afebrile, labs with stable CMP lactic acid 0.8 mild leukocytosis 12.2 ESR 58 influenza COVID respiratory syncytial virus negative. ZHY:QMVHQ area of poorly defined airspace opacity in right upper lobe  CT angio chest:large greater than 9 cm mass transversing the right upper lobe fissure into the right middle lobe and involving the right pulmonary artery. This is highly concerning for primary lung malignancy, abrupt cut off of the right upper lobe apical pulmonary artery-mass effect or PE, perifissural groundglass opacity around the mass, emphysema, subtle hypodensity in the liver.Patient was given vancomycin and cefepime blood culture ordered and admission requested. Seen by pulmonary felt no need for antibiotics. Patient has indicated-she does not want CPR at any cost, okay with trial of intubation.patient is not sure if she wants aggressive treatment for cancer such as chemotherapy and radiation but may be open to immunotherapy.  She shares that she has endured significant medical burden with her longstanding chronic autoimmune disorder and diabetes      Subjective: Seen and  examined this morning Overnight again hypoglycemic nursing found that patient was self dosing insulin  BP stable afebrile Having cough   Assessment and Plan: Principal Problem:   Mass of upper lobe of right lung RUL Lung mass  > 9 cm enveloping right pulmonary artery and crossing across the fissure into the RML Emphysema with 50-pack-year smoking history quit 15 years ago Hemoptysis Peripheral groundglass opacity surrounding the mass infectious/inflammatory? Hypodensity in the hepatic segment: Finding concerning for lung malignancy, plan for bronchoscopy by PCCM  today. Cont antitussives, monitor for hemoptysis-having intermittent hemoptysis.    Sjogren's syndrome Rheumatoid arthritis: followed at [facility].  Patient reports she gets pain/itching nose/ear and different areas due to Sjogren's syndrome. off of a steroid . Cont Tylenol, recently she has been using " a lot of ibuprofen"   Hypothyroidism: cont home Synthroid.   Type 1 diabetes on long-term insulin with hypoglycemia: Hypoglycemia likely from self-medication strongly instructed and encouraged that insulin be provided in the hospital based upon her CBG, and not to self medicate. Cont SSI and low-dose long-acting insulin.  She feels steroid might help her symptoms as she is complaining of pain Recent Labs  Lab 12/08/23 1812 12/08/23 1957 12/10/23 0244 12/10/23 0503 12/10/23 0617 12/10/23 0805 12/10/23 1157  GLUCAP  --    < > 174* 173* 146* 125* 148*  HGBA1C 6.9*  --   --   --   --   --   --    < > = values in this interval not displayed.    Seizure disorder/migraine/ADHD: continue her home Adderall.   Chronic constipation GERD Hx of SIBO: Cont PPI, Linzess.   Anemia: Hemoglobin stable.  Monitor intermittently   Goals of care:  Patient does not wish to receive CPR, agreeable for intubation if needed in the setting of her hemoptysis/procedure and for anything reversible. She understands the complex nature of  her lung mass, she wishes to die with dignityAwaiting further pulmonary input-possibly palliative care evaluation at some point  DVT prophylaxis: SCDs Start: 12/08/23 1525 Code Status:   Code Status: Do not attempt resuscitation (DNR) PRE-ARREST INTERVENTIONS DESIRED Family Communication: plan of care discussed with patient at bedside. Patient status is: Remains hospitalized because of severity of illness Level of care: Stepdown   Dispo: The patient is from:Home.            Anticipated disposition: pending bronch and further plan from PCCM,home hopefully in am.  Objective: Vitals last 24 hrs: Vitals:   12/10/23 0800 12/10/23 0900 12/10/23 1100 12/10/23 1228  BP: (!) 137/57   (!) 141/64  Pulse:  75 71 76  Resp: (!) 24 12 (!) 23 10  Temp: 98.2 F (36.8 C)   98.5 F (36.9 C)  TempSrc: Oral   Temporal  SpO2:  97% 95% 95%  Weight:    61.7 kg  Height:    5\' 8"  (1.727 m)   Weight change:   Physical Examination: General exam: alert awake, oriented.  HEENT:Oral mucosa moist, Ear/Nose WNL grossly Respiratory system: Bilaterally clear BS,no use of accessory muscle Cardiovascular system: S1 & S2 +, No JVD. Gastrointestinal system: Abdomen soft,NT,ND, BS+ Nervous System: Alert, awake, moving all extremities,and following commands. Extremities: LE edema neg,distal peripheral pulses palpable and warm.  Skin: No rashes,no icterus. MSK: Normal muscle bulk,tone, power.   Medications reviewed:  Scheduled Meds:  [MAR Hold] Chlorhexidine Gluconate Cloth  6 each Topical Daily   [MAR Hold] cholecalciferol  5,000 Units Oral Daily   [MAR Hold] cyanocobalamin  3,000 mcg Oral Daily   [MAR Hold] diphenhydrAMINE  25 mg Oral QHS   [MAR Hold] insulin aspart  0-9 Units Subcutaneous Q4H   [MAR Hold] levothyroxine  112 mcg Oral QAC breakfast   [MAR Hold] linaclotide  145 mcg Oral QAC breakfast   [MAR Hold] magnesium oxide  400 mg Oral Daily   [MAR Hold] melatonin  10 mg Oral QHS   [MAR Hold]  methylphenidate  36 mg Oral q morning   [MAR Hold] pantoprazole  40 mg Oral Daily   [MAR Hold] saccharomyces boulardii  250 mg Oral BID  Continuous Infusions:   Diet Order             Diet NPO time specified  Diet effective now                  Intake/Output Summary (Last 24 hours) at 12/10/2023 1313 Last data filed at 12/09/2023 1820 Gross per 24 hour  Intake 1075.07 ml  Output 950 ml  Net 125.07 ml   Net IO Since Admission: 11.34 mL [12/10/23 1313]  Wt Readings from Last 3 Encounters:  12/10/23 61.7 kg  11/14/23 59.5 kg  07/24/23 59.4 kg     Unresulted Labs (From admission, onward)     Start     Ordered   12/09/23 0822  Fungitell Beta-D-Glucan  Once,   R       Question:  Specimen collection method  Answer:  Lab=Lab collect   12/09/23 0821   12/09/23 0500  CBC  Daily,   R      12/08/23 1536   12/08/23 1917  MRSA Next Gen by PCR, Nasal  Once,   R  12/08/23 1916   12/08/23 1525  Legionella Pneumophila Serogp 1 Ur Ag  Once,   R        12/08/23 1536   12/08/23 1020  Protein Electro, Random Urine  Once,   URGENT        12/08/23 1020   12/08/23 1020  Protein electrophoresis, serum  Once,   URGENT        12/08/23 1020   12/08/23 1019  CYCLIC CITRUL PEPTIDE ANTIBODY, IGG/IGA  Once,   URGENT        12/08/23 1020          Data Reviewed: I have personally reviewed following labs and imaging studies CBC: Recent Labs  Lab 12/08/23 1147 12/08/23 1812 12/09/23 0038 12/09/23 0855 12/09/23 1620 12/10/23 0049  WBC 12.2*  --  12.3*  --   --  10.8*  NEUTROABS 9.7*  --   --   --   --   --   HGB 12.6 11.8* 10.9* 11.1* 11.4* 10.8*  HCT 39.2 37.4 33.4* 34.1* 36.0 33.8*  MCV 91.0  --  91.0  --   --  92.1  PLT 422*  --  422*  --   --  396   Basic Metabolic Panel:  Recent Labs  Lab 12/08/23 1147 12/09/23 0038 12/10/23 0049  NA 135 133* 130*  K 4.2 3.9 3.9  CL 100 100 99  CO2 26 24 21*  GLUCOSE 154* 148* 82  BUN 13 14 13   CREATININE 0.54 0.44 0.84  CALCIUM  9.9 8.6* 8.8*   GFR: Estimated Creatinine Clearance: 72 mL/min (by C-G formula based on SCr of 0.84 mg/dL). Liver Function Tests:  Recent Labs  Lab 12/08/23 1147 12/09/23 0038 12/10/23 0049  AST 31 23 25   ALT 20 16 15   ALKPHOS 141* 108 105  BILITOT 0.6 0.7 0.7  PROT 9.0* 6.5 6.8  ALBUMIN 4.2 3.2* 3.0*   Recent Labs  Lab 12/08/23 1147  LIPASE 27   No results for input(s): "AMMONIA" in the last 168 hours. Coagulation Profile:  Recent Labs  Lab 12/08/23 1147  INR 0.9   No results for input(s): "PROBNP" in the last 168 hours.  Recent Labs    12/08/23 1812  HGBA1C 6.9*   Recent Results (from the past 240 hours)  Resp panel by RT-PCR (RSV, Flu A&B, Covid) Anterior Nasal Swab     Status: None   Collection Time: 12/08/23 10:41 AM   Specimen: Anterior Nasal Swab  Result Value Ref Range Status   SARS Coronavirus 2 by RT PCR NEGATIVE NEGATIVE Final    Comment: (NOTE) SARS-CoV-2 target nucleic acids are NOT DETECTED.  The SARS-CoV-2 RNA is generally detectable in upper respiratory specimens during the acute phase of infection. The lowest concentration of SARS-CoV-2 viral copies this assay can detect is 138 copies/mL. A negative result does not preclude SARS-Cov-2 infection and should not be used as the sole basis for treatment or other patient management decisions. A negative result may occur with  improper specimen collection/handling, submission of specimen other than nasopharyngeal swab, presence of viral mutation(s) within the areas targeted by this assay, and inadequate number of viral copies(<138 copies/mL). A negative result must be combined with clinical observations, patient history, and epidemiological information. The expected result is Negative.  Fact Sheet for Patients:  BloggerCourse.com  Fact Sheet for Healthcare Providers:  SeriousBroker.it  This test is no t yet approved or cleared by the [place] and  has been authorized for  detection and/or diagnosis of SARS-CoV-2 by FDA under an Emergency Use Authorization (EUA). This EUA will remain  in effect (meaning this test can be used) for the duration of the COVID-19 declaration under Section 564(b)(1) of the Act, 21 U.S.C.section 360bbb-3(b)(1), unless the authorization is terminated  or revoked sooner.       Influenza A by PCR NEGATIVE NEGATIVE Final   Influenza B by PCR NEGATIVE NEGATIVE Final    Comment: (NOTE) The Xpert Xpress SARS-CoV-2/FLU/RSV plus assay is intended as an aid in the diagnosis of influenza from Nasopharyngeal swab specimens and should not be used as a sole basis for treatment. Nasal washings and aspirates are unacceptable for Xpert Xpress SARS-CoV-2/FLU/RSV testing.  Fact Sheet for Patients: BloggerCourse.com  Fact Sheet for Healthcare Providers: SeriousBroker.it  This test is not yet approved or cleared by the [place] FDA and has been authorized for detection and/or diagnosis of SARS-CoV-2 by FDA under an Emergency Use Authorization (EUA). This EUA will remain in effect (meaning this test can be used) for the duration of the COVID-19 declaration under Section 564(b)(1) of the Act, 21 U.S.C. section 360bbb-3(b)(1), unless the authorization is terminated or revoked.     Resp Syncytial Virus by PCR NEGATIVE NEGATIVE Final    Comment: (NOTE) Fact Sheet for Patients: BloggerCourse.com  Fact Sheet for Healthcare Providers: SeriousBroker.it  This test is not yet approved or cleared by the [place] FDA and has been authorized for detection and/or diagnosis of SARS-CoV-2 by FDA under an Emergency Use Authorization (EUA). This EUA will remain in effect (meaning this test can be used) for the duration of the COVID-19 declaration under Section 564(b)(1) of the Act, 21 U.S.C. section  360bbb-3(b)(1), unless the authorization is terminated or revoked.  Performed at [facility], [address] [place], [place] [address]   Blood culture (routine x 2)     Status: None (Preliminary result)   Collection Time: 12/08/23 11:19 AM   Specimen: BLOOD  Result Value Ref Range Status   Specimen Description   Final    BLOOD SITE NOT SPECIFIED Performed at [facility], [address], [place], [place] [address]    Special Requests   Final    BOTTLES DRAWN AEROBIC AND ANAEROBIC Blood Culture results may not be optimal due to an inadequate volume of blood received in culture bottles Performed at [facility], [address] [place], [place] [address]    Culture   Final    NO GROWTH 2 DAYS Performed at [facility], [address] [place], [place] [address]    Report Status PENDING  Incomplete  Blood culture (routine x 2)     Status: None (Preliminary result)   Collection Time: 12/08/23 11:30 AM   Specimen: BLOOD  Result Value Ref Range Status   Specimen Description   Final    BLOOD LEFT ANTECUBITAL Performed at [facility], [address] [place], [place] [address]    Special Requests   Final    BOTTLES DRAWN AEROBIC AND ANAEROBIC Blood Culture results may not be optimal due to an inadequate volume of blood received in culture bottles Performed at [facility], [address] [place], [place] [address]    Culture   Final    NO GROWTH 2 DAYS Performed at [facility], [address] [place], [place] [address]    Report Status PENDING  Incomplete    Antimicrobials/Microbiology: Anti-infectives (From admission,  onward)    Start     Dose/Rate Route Frequency Ordered Stop   12/08/23 1800  cefTRIAXone (ROCEPHIN) 2 g in sodium chloride 0.9 % 100 mL IVPB  Status:  Discontinued        2 g 200 mL/hr over 30 Minutes Intravenous Every 24 hours 12/08/23 1536 12/08/23  1738   12/08/23 1600  azithromycin (ZITHROMAX) 500 mg in sodium chloride 0.9 % 250 mL IVPB  Status:  Discontinued        500 mg 250 mL/hr over 60 Minutes Intravenous Every 24 hours 12/08/23 1536 12/08/23 1738   12/08/23 1200  vancomycin (VANCOREADY) IVPB 1250 mg/250 mL  Status:  Discontinued        1,250 mg 166.7 mL/hr over 90 Minutes Intravenous  Once 12/08/23 1108 12/09/23 1259   12/08/23 1115  ceFEPIme (MAXIPIME) 2 g in sodium chloride 0.9 % 100 mL IVPB        2 g 200 mL/hr over 30 Minutes Intravenous  Once 12/08/23 1108 12/08/23 1308         Component Value Date/Time   SDES  12/08/2023 1130    BLOOD LEFT ANTECUBITAL Performed at [facility], [address] [place], [place] [address]    SPECREQUEST  12/08/2023 1130    BOTTLES DRAWN AEROBIC AND ANAEROBIC Blood Culture results may not be optimal due to an inadequate volume of blood received in culture bottles Performed at [facility], [address] [place], [place] [address]    CULT  12/08/2023 1130    NO GROWTH 2 DAYS Performed at [facility], [address] [place], [place] [address]    REPTSTATUS PENDING 12/08/2023 1130  Radiology Studies: CT Angio Chest PE W and/or Wo Contrast Result Date: 12/08/2023 CLINICAL DATA:  Hemoptysis with right upper chest opacity on same day chest radiograph EXAM: CT ANGIOGRAPHY CHEST WITH CONTRAST TECHNIQUE: Multidetector CT imaging of the chest was performed using the standard protocol during bolus administration of intravenous contrast. Multiplanar CT image reconstructions and MIPs were obtained to evaluate the vascular anatomy. RADIATION DOSE REDUCTION: This exam was performed according to the departmental dose-optimization program which includes automated exposure control, adjustment of the mA and/or kV according to patient size and/or use of iterative reconstruction technique. CONTRAST:  75mL OMNIPAQUE IOHEXOL 350 MG/ML SOLN COMPARISON:  Same day  chest radiograph FINDINGS: Cardiovascular: The study is high quality for the evaluation of pulmonary embolism. There is abrupt cut off of right upper lobe apical subsegmental pulmonary artery (10:132). There are otherwise no filling defects in the central, lobar, segmental or subsegmental pulmonary artery branches to suggest acute pulmonary embolism. Great vessels are normal in course and caliber. Normal heart size. No significant pericardial fluid/thickening. Mediastinum/Nodes: Thyroidectomy. Normal esophagus. 18 mm right hilar lymphadenopathy (4:66). Left axillary surgical clips. Lungs/Pleura: The central airways are patent. Moderate effacement of right upper lobe bronchus and bronchials with suspected complete obstruction of at least 1 segmental airway (12:57) secondary to consolidative mass centered within the posterior right upper lobe measuring 9.9 x 9.9 cm (12:57). This mass crosses the major fissure into the superior segment right lower lobe and extends towards the hilum. There is peripheral ground-glass opacity surrounding the mass with irregular nodule in the right apex measuring 1.1 x 0.5 cm (12:22). Mild upper lobe predominant centrilobular emphysema. Subsegmental right middle lobe and lingular atelectasis. No pneumothorax. No pleural effusion. Upper abdomen: Subtle hypodensity within hepatic segment 6 measures 4.0 x 3.2 cm (4:151). Musculoskeletal: No acute  or abnormal lytic or blastic osseous lesions. Review of the MIP images confirms the above findings. IMPRESSION: 1. Large consolidative mass centered within the posterior right upper lobe measuring 9.9 x 9.9 cm, crossing the major fissure into the superior segment right lower lobe and extending towards the hilum, highly suspicious for primary lung malignancy. 2. Abrupt cut off of right upper lobe apical subsegmental pulmonary artery, which may be secondary to mass effect or pulmonary embolism. No other filling defects in the central, lobar, segmental  or subsegmental pulmonary artery branches to suggest acute pulmonary embolism. 3. Effacement and obstruction of at least 1 right upper lobe bronchiole by the mass. 4. Peripheral ground-glass opacity surrounding the mass with irregular nodule in the right apex measuring 1.1 x 0.5 cm, which may be infectious/inflammatory or reflect satellite nodule. 5. Right hilar lymphadenopathy, suspicious for metastatic disease. 6. Subtle hypodensity within hepatic segment 6 measures 4.0 x 3.2 cm, incompletely characterized. Recommend further evaluation with nonemergent contrast-enhanced MRI abdomen. 7.  Emphysema (ICD10-J43.9). Electronically Signed   By: Agustin Cree M.D.   On: 12/08/2023 14:42   LOS: 2 days  Total time spent in review of labs and imaging, patient evaluation, formulation of plan, documentation and communication with family: 35 minutes  Lanae Boast, MD  [facility]  12/10/2023, 1:13 PM

## 2023-12-10 NOTE — Progress Notes (Signed)
NAME:  Mary Erickson, MRN:  [id number], DOB:  04/14/1966, LOS: 2 ADMISSION DATE:  12/08/2023, CONSULTATION DATE:  12/10/2023 REFERRING MD:  Lanae Boast, MD, CHIEF COMPLAINT:  lung mass  History of Present Illness:  Mary Erickson is a 58 year old woman with past medical history of Sjogren's, type 1 diabetes mellitus, tobacco use disorder quit in 2010, who presents with cough and hemoptysis.  She notes having thyroid surgery in April 2024 and a couple of days after that she started having a nagging cough.  She notes the cough has been pretty persistent over the last year but yesterday in the middle of the night she coughed up a teaspoon of bright red blood.  She notes that her cough is worse with any kind of exertion or movement.  She had worsening hemoptysis which is what caused her to present to the ED.  She denies fevers chills night sweats but does note she has a trouble gaining weight over the last few months.  She had a chest x-ray which showed a opacification of the right upper lobe which led to CT scan which shows a large greater than 9 cm mass transversing the right upper lobe fissure into the right middle lobe and involving the right pulmonary artery.  This is highly concerning for primary lung malignancy.  She notes being chronically ill for much of her life with her autoimmune disorders and has seen family members in the healthcare setting.  She does not want to undergo chest compressions under any circumstances but would be open to intubation for reversible treatable causes.  She is not sure she wants aggressive treatment for cancer such as chemotherapy and radiation but may be open to immunotherapy.  Regardless she is open to biopsy options including bronchoscopy if necessary.  PCCM was consulted to assist in management of the pulmonary mass.  Pertinent  Medical History  Sjogren's Type 1 DM Emphysema Quit smoking 2010, 50 pack year smoker with passive smoke exposure  Significant  Hospital Events: Including procedures, antibiotic start and stop dates in addition to other pertinent events   1/26 admitted for hemoptysis  Interim History / Subjective:   No acute events overnight Episode of hemoptysis yesterday but none since Continues to have episodes of dry coughing All questions answered regarding bronchoscopy today  Objective   Blood pressure (!) 127/54, pulse 80, temperature 98.4 F (36.9 C), temperature source Oral, resp. rate (!) 24, height 5\' 8"  (1.727 m), weight 61.7 kg, SpO2 (!) 89%.        Intake/Output Summary (Last 24 hours) at 12/10/2023 0732 Last data filed at 12/09/2023 1820 Gross per 24 hour  Intake 1662.17 ml  Output 1900 ml  Net -237.83 ml   Filed Weights   12/08/23 0933  Weight: 61.7 kg    Examination: General: No acute distress HENT: [place]/AT, moist mucous membranes Lungs: Breath sound diminished right lung otherwise breathing nonlabored no wheeze Cardiovascular: Regular rate and rhythm Abdomen: Soft,  nondistended Extremities: No peripheral edema, diffuse arthritic changes in upper EXTR Neuro: Normal speech no focal asymmetry  Resolved Hospital Problem list     Assessment & Plan:   Right upper lobe lung mass greater than 9 cm enveloping the right pulmonary artery and crossing across the fissure into the right middle lobe Emphysema with 50-pack-year smoking history, quit 15 years ago  Plan - bronchosocpy scheduled for today - has been NPO since midnight last night   Labs   CBC: Recent Labs  Lab 12/08/23 1147 12/08/23  1812 12/09/23 0038 12/09/23 0855 12/09/23 1620 12/10/23 0049  WBC 12.2*  --  12.3*  --   --  10.8*  NEUTROABS 9.7*  --   --   --   --   --   HGB 12.6 11.8* 10.9* 11.1* 11.4* 10.8*  HCT 39.2 37.4 33.4* 34.1* 36.0 33.8*  MCV 91.0  --  91.0  --   --  92.1  PLT 422*  --  422*  --   --  396    Basic Metabolic Panel: Recent Labs  Lab 12/08/23 1147 12/09/23 0038 12/10/23 0049  NA 135 133* 130*  K 4.2  3.9 3.9  CL 100 100 99  CO2 26 24 21*  GLUCOSE 154* 148* 82  BUN 13 14 13   CREATININE 0.54 0.44 0.84  CALCIUM 9.9 8.6* 8.8*   GFR: Estimated Creatinine Clearance: 72 mL/min (by C-G formula based on SCr of 0.84 mg/dL). Recent Labs  Lab 12/08/23 1147 12/08/23 1249 12/09/23 0038 12/10/23 0049  WBC 12.2*  --  12.3* 10.8*  LATICACIDVEN  --  0.8  --   --     Liver Function Tests: Recent Labs  Lab 12/08/23 1147 12/09/23 0038 12/10/23 0049  AST 31 23 25   ALT 20 16 15   ALKPHOS 141* 108 105  BILITOT 0.6 0.7 0.7  PROT 9.0* 6.5 6.8  ALBUMIN 4.2 3.2* 3.0*   Recent Labs  Lab 12/08/23 1147  LIPASE 27   No results for input(s): "AMMONIA" in the last 168 hours.  ABG    Component Value Date/Time   TCO2 27 12/07/2019 0607     Coagulation Profile: Recent Labs  Lab 12/08/23 1147  INR 0.9    Cardiac Enzymes: No results for input(s): "CKTOTAL", "CKMB", "CKMBINDEX", "TROPONINI" in the last 168 hours.  HbA1C: Hgb A1c MFr Bld  Date/Time Value Ref Range Status  12/08/2023 06:12 PM 6.9 (H) 4.8 - 5.6 % Final    Comment:    (NOTE) Pre diabetes:          5.7%-6.4%  Diabetes:              >6.4%  Glycemic control for   <7.0% adults with diabetes   03/01/2023 11:53 AM 6.9 (H) 4.8 - 5.6 % Final    Comment:    (NOTE) Pre diabetes:          5.7%-6.4%  Diabetes:              >6.4%  Glycemic control for   <7.0% adults with diabetes     CBG: Recent Labs  Lab 12/09/23 2218 12/10/23 0216 12/10/23 0244 12/10/23 0503 12/10/23 0617  GLUCAP 129* 63* 174* 173* 146*   Melody Comas, MD [facility] Office: [phone]   See Amion for personal pager PCCM on call pager [phone] until 7pm. Please call Elink 7p-7a. [phone]

## 2023-12-10 NOTE — Progress Notes (Signed)
Hypoglycemic Event  CBG: 63  Treatment: D50 25 mL (12.5 gm)  Symptoms: None  Follow-up CBG: Time:0244 CBG Result:174  Possible Reasons for Event: Unknown  Comments/MD notified:    Aline Brochure

## 2023-12-10 NOTE — Transfer of Care (Signed)
Immediate Anesthesia Transfer of Care Note  Patient: Mary Erickson  Procedure(s) Performed: VIDEO BRONCHOSCOPY WITHOUT FLUORO ENDOBRONCHIAL ULTRASOUND FINE NEEDLE ASPIRATION (FNA) LINEAR BRONCHIAL WASHINGS  Patient Location: PACU and Endoscopy Unit  Anesthesia Type:General  Level of Consciousness: awake and alert   Airway & Oxygen Therapy: Patient Spontanous Breathing and Patient connected to face mask oxygen  Post-op Assessment: Report given to RN and Post -op Vital signs reviewed and stable  Post vital signs: Reviewed and stable  Last Vitals:  Vitals Value Taken Time  BP 106/54 12/10/23 1436  Temp    Pulse 73 12/10/23 1438  Resp 21 12/10/23 1438  SpO2 94 % 12/10/23 1438  Vitals shown include unfiled device data.  Last Pain:  Vitals:   12/10/23 1228  TempSrc: Temporal  PainSc: 0-No pain         Complications: No notable events documented.

## 2023-12-10 NOTE — Progress Notes (Signed)
This charge nurse was notified that patient had made suspicious comments regarding self administration of insulin at 0400. Primary nurse immediately alerted charge nurse to advise on best plan of action. Please see previous notes entered by Theora Gianotti, RN and Margarito Liner, RN. Chinita Greenland, NP and [facility] notified of the incident. Awaiting next blood sugar results, will check again at 0615 to determine next plan of action.

## 2023-12-10 NOTE — Progress Notes (Signed)
Charge nurse was called to patient room with concerns from primary nurse Theora Gianotti, RN) that patient was purposely turning off her own monitoring equipment due to "inconvenient beeping and noises that have the rhyme or rhythm for". Patient was irritated that charge nurse was notified of the concern. Patient was educated on the importance of why she is currently on cardiac monitoring. Charge nurse evaluated monitor and was unable to find beeping sound per patient. Patient refused ear plugs offered and stated "She may as well just say up all night now" and "those ear plugs aren't going to do anything". Charge nurse also found patient to be recording staff conversations, stating that she was recording to keep track of the monitoring beeping when staff is not in the room. AC was notified of patient recording staff, no escalation necessary at this time.

## 2023-12-10 NOTE — Progress Notes (Addendum)
Pt refused scheduled 0400 insulin, CBG 173 pt stated "I gave myself 1.5 of insulin about 45 mins ago, I should be fine". Insulin charted in [facility] as "not given". Charge Nurse and NP notified of incident.

## 2023-12-10 NOTE — Op Note (Signed)
Flexible and EBUS Bronchoscopy Procedure Note  Mary Erickson  [id number]  1966/06/08  Date:12/10/23  Time:2:26 PM   Provider Performing:Raheem Kolbe B Hermina Barnard   Procedure: Flexible bronchoscopy and EBUS Bronchoscopy  Indication(s) Right Upper Lobe Mass  Consent Risks of the procedure as well as the alternatives and risks of each were explained to the patient and/or caregiver.  Consent for the procedure was obtained.  Anesthesia General Anesthesia   Time Out Verified patient identification, verified procedure, site/side was marked, verified correct patient position, special equipment/implants available, medications/allergies/relevant history reviewed, required imaging and test results available.   Sterile Technique Usual hand hygiene, masks, gowns, and gloves were used   Procedure Description Diagnostic bronchoscope advanced through endotracheal tube and into airway.  Airways were examined down to subsegmental level with findings noted below.  Following diagnostic evaluation the diagnostic bronchoscope was then removed and the EBUS bronchoscope was advanced into airway with RUL mass biopsied and sent for slide, cell block, and/or culture.  The EBUS bronchoscope was removed after assuring no active bleeding from biopsy site.  Findings:  - Normal appearing left bronchial tree - Extrinsic compression on posterolateral right main stem bronchi extending down to the superior segment of the RLL, with narrowing of the entrance of the superior segement of the RLL.  - Endobronchial lesions noted of the RUL posterior segment entrance - Images of above findings taken   Complications/Tolerance None; patient tolerated the procedure well. Chest X-ray is needed post procedure.   EBL Minimal   Specimen(s) RUL Mass needle biopsies for slides and cell block RUL BAL for AFB, Fungal and bacterial cultures

## 2023-12-10 NOTE — Anesthesia Preprocedure Evaluation (Signed)
Anesthesia Evaluation  Patient identified by MRN, date of birth, ID band Patient awake    Reviewed: Allergy & Precautions, NPO status , Patient's Chart, lab work & pertinent test results  Airway Mallampati: I  TM Distance: >3 FB Neck ROM: Full    Dental  (+) Teeth Intact, Dental Advisory Given   Pulmonary former smoker   breath sounds clear to auscultation       Cardiovascular hypertension,  Rhythm:Regular Rate:Normal     Neuro/Psych  Headaches, Seizures -,  PSYCHIATRIC DISORDERS         GI/Hepatic ,GERD  Medicated,,(+) Hepatitis -, B  Endo/Other  diabetes, Type 2, Insulin DependentHypothyroidism    Renal/GU negative Renal ROS     Musculoskeletal  (+) Arthritis ,    Abdominal   Peds  Hematology  (+) Blood dyscrasia, anemia   Anesthesia Other Findings   Reproductive/Obstetrics                             Anesthesia Physical Anesthesia Plan  ASA: 3  Anesthesia Plan: General   Post-op Pain Management:    Induction: Intravenous  PONV Risk Score and Plan: 3 and Ondansetron, Midazolam and Dexamethasone  Airway Management Planned: Oral ETT  Additional Equipment: None  Intra-op Plan:   Post-operative Plan: Extubation in OR  Informed Consent: I have reviewed the patients History and Physical, chart, labs and discussed the procedure including the risks, benefits and alternatives for the proposed anesthesia with the patient or authorized representative who has indicated his/her understanding and acceptance.   Patient has DNR.  Discussed DNR with patient and Continue DNR.   Dental advisory given  Plan Discussed with: CRNA  Anesthesia Plan Comments:        Anesthesia Quick Evaluation

## 2023-12-10 NOTE — Progress Notes (Addendum)
       Overnight   NAME: Mary Erickson MRN: [id number] DOB : 1966-01-24    Date of Service   12/10/2023   HPI/Events of Note    Notified by RN for concern over patient self dosing insulin.  Assigned RN and ICU charge RN relayed that patient has been trying off monitor in room, and self dosing with insulin unknown to staff until disclosure this morning.  Patient relayed to RN that she had self dosed an amount of insulin, therefore, RN did not give currently do dose. Patient had the following drop earlier in the night.    Latest Reference Range & Units 12/09/23 22:18 12/10/23 02:16 12/10/23 02:44 12/10/23 05:03  Glucose-Capillary 70 - 99 mg/dL 213 (H) 63 (L) 086 (H) 173 (H)  (H): Data is abnormally high (L): Data is abnormally low  Patient required D50 earlier under [place] hypoglycemia protocol in adults with diabetes.  Current CBGs seen above at 0503 hrs. Patient relayed that this self dosing was at 0445 hours plus +/-.  In light of this, assigned RN will check CBG between 0600 and 0615 hrs. Patient has made reportedly comment about no regard for her continued health due to her diagnoses.( Reported by Assigned RN)   Interventions/ Plan   Recheck at [phone] hrs  Patient is advised not to self dose medication.  Tele-sitter Consider Psych consult if any further      Chinita Greenland BSN MSNA MSN ACNPC-AG Acute Care Nurse Practitioner [facility]

## 2023-12-10 NOTE — Progress Notes (Addendum)
0617 CBG recheck: 146.  Pt denies taking additional insulin from home. RN informed the pt of the dangers of taking insulin from home while in the hospital. RN requested that the patient stop taking insulin from home as it interferes with care, pt educated of the dangers of taking too much insulin. Pt became increasingly agitated NT, abigail present during interaction. RN ensures that if patient has any other concerns her call bell is available at bedside.

## 2023-12-10 NOTE — Anesthesia Procedure Notes (Signed)
Procedure Name: Intubation Date/Time: 12/10/2023 1:53 PM  Performed by: Deri Fuelling, CRNAPre-anesthesia Checklist: Patient identified, Emergency Drugs available, Suction available and Patient being monitored Patient Re-evaluated:Patient Re-evaluated prior to induction Oxygen Delivery Method: Circle system utilized Preoxygenation: Pre-oxygenation with 100% oxygen Induction Type: IV induction Ventilation: Mask ventilation without difficulty Laryngoscope Size: Mac and 4 Grade View: Grade II Tube type: Oral Tube size: 8.0 mm Number of attempts: 1 Airway Equipment and Method: Stylet and Oral airway Placement Confirmation: ETT inserted through vocal cords under direct vision, positive ETCO2 and breath sounds checked- equal and bilateral Secured at: 23 cm Tube secured with: Tape Dental Injury: Teeth and Oropharynx as per pre-operative assessment

## 2023-12-10 NOTE — Plan of Care (Signed)

## 2023-12-11 ENCOUNTER — Telehealth: Payer: Self-pay | Admitting: Pulmonary Disease

## 2023-12-11 DIAGNOSIS — R918 Other nonspecific abnormal finding of lung field: Secondary | ICD-10-CM | POA: Diagnosis not present

## 2023-12-11 DIAGNOSIS — K5909 Other constipation: Secondary | ICD-10-CM

## 2023-12-11 DIAGNOSIS — Z515 Encounter for palliative care: Secondary | ICD-10-CM | POA: Diagnosis not present

## 2023-12-11 DIAGNOSIS — Z7189 Other specified counseling: Secondary | ICD-10-CM

## 2023-12-11 DIAGNOSIS — Z66 Do not resuscitate: Secondary | ICD-10-CM

## 2023-12-11 DIAGNOSIS — M3500 Sicca syndrome, unspecified: Secondary | ICD-10-CM

## 2023-12-11 DIAGNOSIS — E1069 Type 1 diabetes mellitus with other specified complication: Secondary | ICD-10-CM

## 2023-12-11 DIAGNOSIS — K638219 Small intestinal bacterial overgrowth, unspecified: Secondary | ICD-10-CM

## 2023-12-11 DIAGNOSIS — M069 Rheumatoid arthritis, unspecified: Secondary | ICD-10-CM

## 2023-12-11 DIAGNOSIS — K5900 Constipation, unspecified: Secondary | ICD-10-CM

## 2023-12-11 DIAGNOSIS — Z87898 Personal history of other specified conditions: Secondary | ICD-10-CM

## 2023-12-11 LAB — ACID FAST SMEAR (AFB, MYCOBACTERIA): Acid Fast Smear: NEGATIVE

## 2023-12-11 LAB — GLUCOSE, CAPILLARY
Glucose-Capillary: 200 mg/dL — ABNORMAL HIGH (ref 70–99)
Glucose-Capillary: 62 mg/dL — ABNORMAL LOW (ref 70–99)
Glucose-Capillary: 71 mg/dL (ref 70–99)
Glucose-Capillary: 74 mg/dL (ref 70–99)
Glucose-Capillary: 153 mg/dL — ABNORMAL HIGH (ref 70–99)
Glucose-Capillary: 258 mg/dL — ABNORMAL HIGH (ref 70–99)
Glucose-Capillary: 211 mg/dL — ABNORMAL HIGH (ref 70–99)

## 2023-12-11 LAB — CBC
HCT: 34.4 % — ABNORMAL LOW (ref 36.0–46.0)
Hemoglobin: 11.1 g/dL — ABNORMAL LOW (ref 12.0–15.0)
MCH: 29.4 pg (ref 26.0–34.0)
MCHC: 32.3 g/dL (ref 30.0–36.0)
MCV: 91 fL (ref 80.0–100.0)
Platelets: 417 10*3/uL — ABNORMAL HIGH (ref 150–400)
RBC: 3.78 MIL/uL — ABNORMAL LOW (ref 3.87–5.11)
RDW: 14.1 % (ref 11.5–15.5)
WBC: 12.2 10*3/uL — ABNORMAL HIGH (ref 4.0–10.5)
nRBC: 0 % (ref 0.0–0.2)

## 2023-12-11 LAB — FUNGITELL BETA-D-GLUCAN
Fungitell Value:: <31.25 pg/mL
Result Name:: NEGATIVE

## 2023-12-11 LAB — LEGIONELLA PNEUMOPHILA SEROGP 1 UR AG: L. pneumophila Serogp 1 Ur Ag: NEGATIVE

## 2023-12-11 MED ORDER — CARMEX CLASSIC LIP BALM EX OINT
TOPICAL_OINTMENT | CUTANEOUS | Status: DC | PRN
  Filled 2023-12-11: qty 10

## 2023-12-11 MED ORDER — PREDNISONE 20 MG PO TABS
40.0000 mg | ORAL_TABLET | Freq: Every day | ORAL | Status: DC
  Administered 2023-12-11 – 2023-12-12 (×2): 40 mg via ORAL
  Filled 2023-12-11 (×2): qty 2

## 2023-12-11 MED ORDER — PREDNISONE 20 MG PO TABS: 30.0000 mg | ORAL_TABLET | Freq: Every day | ORAL | Status: DC

## 2023-12-11 MED ORDER — PREDNISONE 20 MG PO TABS: 20.0000 mg | ORAL_TABLET | Freq: Every day | ORAL | Status: DC

## 2023-12-11 MED ORDER — PREDNISONE 10 MG PO TABS: 10.0000 mg | ORAL_TABLET | Freq: Every day | ORAL | Status: DC

## 2023-12-11 MED ORDER — DOCUSATE SODIUM 100 MG PO CAPS
100.0000 mg | ORAL_CAPSULE | Freq: Two times a day (BID) | ORAL | Status: AC
Start: 2023-12-11 — End: 2023-12-11
  Administered 2023-12-11 (×2): 100 mg via ORAL
  Filled 2023-12-11 (×2): qty 1

## 2023-12-11 MED ORDER — INSULIN ASPART 100 UNIT/ML IJ SOLN
3.0000 [IU] | Freq: Three times a day (TID) | INTRAMUSCULAR | Status: DC
  Administered 2023-12-11 (×2): 3 [IU] via SUBCUTANEOUS

## 2023-12-11 NOTE — Progress Notes (Signed)
[facility] PROGRESS NOTE    Progress Note  KEANU FRICKEY  WUJ:[id number] DOB: [birth year] DOA: 12/08/2023 PCP: Barnie Mort, PA-C     Brief Narrative:   DONNISHA BESECKER is an 58 y.o. female past medical history of 50-year pack tobacco use Sjogren's syndrome, rheumatoid arthritis, diabetes mellitus type 1, chronic constipation history of hepatitis B comes into the ED with hemoptysis that started 3 days prior to admission, with nose and throat pain for which she has been taking NSAIDs.  In the ED she was found to be afebrile with a lactic acid of 0.8 mild leukocytosis ESR 58 chest x-ray showed right upper lobe infiltrate, CT angio of the chest was done large mass 9.9 x 9.1 along the superior right lower lobe extending into the helium, abrupt cut off of the apical right subsegmental air artery secondary to the mass and there is groundglass opacity around the mass with hilar lymphadenopathy.  Was started empirically on IV vancomycin and cefepime in the ED goals of care were discussed with her she is unsure whether she wanted chemotherapy, radiation or immunotherapy.   Assessment/Plan:   Mass of upper lobe of right lung Discerning for malignancy PCCM was consulted and bronchoscopy was performed on 12/10/2023. She was initially started on empiric antibiotics which were held. She has remained afebrile leukocytosis stable. Question of the leukocytosis reactive due to malignancy.  Type 1 diabetes mellitus (HCC) Currently on long-acting insulin plus sliding scale blood glucose fairly controlled. Her last A1c was 6.9.  Sjogren syndrome (HCC)/ Rheumatoid arthritis in remission [facility]) She is off chronic steroids continue Tylenol and ibuprofen.  Hypothyroidism: Continue Synthroid.  History of seizures/ADHD: Continue home Adderall she has had no seizure disorder with last several years.  Chronic constipation Started on MiraLAX daily.  Normocytic anemia: Follow-up PCP as an  outpatient.  Goals Of care: Patient does not wish to receive CPR. Intubated if needed for temporary measures. She understands the complex nature of the lung mass and wishes to die with dignity, palliative care was consulted.   DVT prophylaxis: scd's Family Communication:none Status is: Inpatient Remains inpatient appropriate because: Transfer to MedSurg    Code Status:     Code Status Orders  (From admission, onward)           Start     Ordered   12/08/23 1534  Do not attempt resuscitation (DNR) Pre-Arrest Interventions Desired  Continuous       Question Answer Comment  If pulseless and not breathing No CPR or chest compressions.   In Pre-Arrest Conditions (Patient Has Pulse and Is Breathing) May intubate, use advanced airway interventions and cardioversion/ACLS medications if appropriate or indicated. May transfer to ICU.   Consent: Discussion documented in EHR or advanced directives reviewed      12/08/23 1536           Code Status History     Date Active Date Inactive Code Status Order ID Comments User Context   03/01/2023 1038 03/02/2023 1604 Full Code [id number]  Darnell Level, MD Inpatient   04/17/2021 0221 04/17/2021 2233 Full Code [id number]  Chotiner, Claudean Severance, MD ED   12/11/2019 0548 12/12/2019 0151 Partial Code [id number]  Theotis Barrio, MD Inpatient   12/11/2019 0514 12/11/2019 0548 Full Code [id number]  Theotis Barrio, MD Inpatient   12/07/2019 1342 12/09/2019 2049 Full Code [id number]  Burna Cash, MD ED   10/02/2016 1458 10/03/2016 1353 Full Code [id number]  Almond Lint, MD Inpatient  Advance Directive Documentation    Flowsheet Row Most Recent Value  Type of Advance Directive --  Pre-existing out of facility DNR order (yellow form or pink MOST form) Physician notified to receive inpatient order  "MOST" Form in Place? --         IV Access:   Peripheral IV   Procedures and diagnostic studies:   DG CHEST PORT 1 VIEW Result Date:  12/10/2023 CLINICAL DATA:  Post bronchoscopy with biopsy EXAM: PORTABLE CHEST - 1 VIEW COMPARISON:  12/08/2023 FINDINGS: Stable right upper chest opacity. No pneumothorax. Left lung clear. Heart size and mediastinal contours are within normal limits. No effusion. Surgical clips left axilla. IMPRESSION: 1. No pneumothorax. 2. Stable right upper chest opacity. Electronically Signed   By: Corlis Leak M.D.   On: 12/10/2023 15:10     Medical Consultants:   None.   Subjective:    Xoie R Usery hemoptysis is improved.  Objective:    Vitals:   12/11/23 0300 12/11/23 0400 12/11/23 0500 12/11/23 0600  BP:  (!) 100/47  (!) 108/41  Pulse: 65 67 69 64  Resp: 19 19 17 18   Temp:      TempSrc:      SpO2: 94% (!) 89% 92% 92%  Weight:      Height:       SpO2: 92 % O2 Flow Rate (L/min): 2 L/min   Intake/Output Summary (Last 24 hours) at 12/11/2023 0703 Last data filed at 12/10/2023 1429 Gross per 24 hour  Intake 200 ml  Output --  Net 200 ml   Filed Weights   12/08/23 0933 12/10/23 1228  Weight: 61.7 kg 61.7 kg    Exam: General exam: In no acute distress. Respiratory system: Good air movement and clear to auscultation. Cardiovascular system: S1 & S2 heard, RRR. No JVD. Gastrointestinal system: Abdomen is nondistended, soft and nontender.  Extremities: No pedal edema. Skin: No rashes, lesions or ulcers Psychiatry: Judgement and insight appear normal. Mood & affect appropriate.    Data Reviewed:    Labs: Basic Metabolic Panel: Recent Labs  Lab 12/08/23 1147 12/09/23 0038 12/10/23 0049  NA 135 133* 130*  K 4.2 3.9 3.9  CL 100 100 99  CO2 26 24 21*  GLUCOSE 154* 148* 82  BUN 13 14 13   CREATININE 0.54 0.44 0.84  CALCIUM 9.9 8.6* 8.8*   GFR Estimated Creatinine Clearance: 72 mL/min (by C-G formula based on SCr of 0.84 mg/dL). Liver Function Tests: Recent Labs  Lab 12/08/23 1147 12/09/23 0038 12/10/23 0049  AST 31 23 25   ALT 20 16 15   ALKPHOS 141* 108 105   BILITOT 0.6 0.7 0.7  PROT 9.0* 6.5 6.8  ALBUMIN 4.2 3.2* 3.0*   Recent Labs  Lab 12/08/23 1147  LIPASE 27   No results for input(s): "AMMONIA" in the last 168 hours. Coagulation profile Recent Labs  Lab 12/08/23 1147  INR 0.9   COVID-19 Labs  Recent Labs    12/08/23 1149  CRP 1.3*    Lab Results  Component Value Date   SARSCOV2NAA NEGATIVE 12/08/2023   SARSCOV2NAA NEGATIVE 04/17/2021   SARSCOV2NAA NEGATIVE 03/18/2020   SARSCOV2NAA NEGATIVE 12/11/2019    CBC: Recent Labs  Lab 12/08/23 1147 12/08/23 1812 12/09/23 0038 12/09/23 0855 12/09/23 1620 12/10/23 0049 12/11/23 0226  WBC 12.2*  --  12.3*  --   --  10.8* 12.2*  NEUTROABS 9.7*  --   --   --   --   --   --  HGB 12.6   < > 10.9* 11.1* 11.4* 10.8* 11.1*  HCT 39.2   < > 33.4* 34.1* 36.0 33.8* 34.4*  MCV 91.0  --  91.0  --   --  92.1 91.0  PLT 422*  --  422*  --   --  396 417*   < > = values in this interval not displayed.   Cardiac Enzymes: No results for input(s): "CKTOTAL", "CKMB", "CKMBINDEX", "TROPONINI" in the last 168 hours. BNP (last 3 results) No results for input(s): "PROBNP" in the last 8760 hours. CBG: Recent Labs  Lab 12/10/23 0805 12/10/23 1157 12/10/23 1441 12/10/23 1603 12/10/23 2110  GLUCAP 125* 148* 94 129* 187*   D-Dimer: No results for input(s): "DDIMER" in the last 72 hours. Hgb A1c: Recent Labs    12/08/23 1812  HGBA1C 6.9*   Lipid Profile: No results for input(s): "CHOL", "HDL", "LDLCALC", "TRIG", "CHOLHDL", "LDLDIRECT" in the last 72 hours. Thyroid function studies: No results for input(s): "TSH", "T4TOTAL", "T3FREE", "THYROIDAB" in the last 72 hours.  Invalid input(s): "FREET3" Anemia work up: No results for input(s): "VITAMINB12", "FOLATE", "FERRITIN", "TIBC", "IRON", "RETICCTPCT" in the last 72 hours. Sepsis Labs: Recent Labs  Lab 12/08/23 1147 12/08/23 1249 12/09/23 0038 12/10/23 0049 12/11/23 0226  WBC 12.2*  --  12.3* 10.8* 12.2*  LATICACIDVEN  --   0.8  --   --   --    Microbiology Recent Results (from the past 240 hours)  Resp panel by RT-PCR (RSV, Flu A&B, Covid) Anterior Nasal Swab     Status: None   Collection Time: 12/08/23 10:41 AM   Specimen: Anterior Nasal Swab  Result Value Ref Range Status   SARS Coronavirus 2 by RT PCR NEGATIVE NEGATIVE Final    Comment: (NOTE) SARS-CoV-2 target nucleic acids are NOT DETECTED.  The SARS-CoV-2 RNA is generally detectable in upper respiratory specimens during the acute phase of infection. The lowest concentration of SARS-CoV-2 viral copies this assay can detect is 138 copies/mL. A negative result does not preclude SARS-Cov-2 infection and should not be used as the sole basis for treatment or other patient management decisions. A negative result may occur with  improper specimen collection/handling, submission of specimen other than nasopharyngeal swab, presence of viral mutation(s) within the areas targeted by this assay, and inadequate number of viral copies(<138 copies/mL). A negative result must be combined with clinical observations, patient history, and epidemiological information. The expected result is Negative.  Fact Sheet for Patients:  BloggerCourse.com  Fact Sheet for Healthcare Providers:  SeriousBroker.it  This test is no t yet approved or cleared by the [place] FDA and  has been authorized for detection and/or diagnosis of SARS-CoV-2 by FDA under an Emergency Use Authorization (EUA). This EUA will remain  in effect (meaning this test can be used) for the duration of the COVID-19 declaration under Section 564(b)(1) of the Act, 21 U.S.C.section 360bbb-3(b)(1), unless the authorization is terminated  or revoked sooner.       Influenza A by PCR NEGATIVE NEGATIVE Final   Influenza B by PCR NEGATIVE NEGATIVE Final    Comment: (NOTE) The Xpert Xpress SARS-CoV-2/FLU/RSV plus assay is intended as an aid in the  diagnosis of influenza from Nasopharyngeal swab specimens and should not be used as a sole basis for treatment. Nasal washings and aspirates are unacceptable for Xpert Xpress SARS-CoV-2/FLU/RSV testing.  Fact Sheet for Patients: BloggerCourse.com  Fact Sheet for Healthcare Providers: SeriousBroker.it  This test is not yet approved or cleared by the  [place] Fut[employer] trader and has been authorized for detection and/or diagnosis of SARS-CoV-2 by FDA under an TEFL teacher (EUA). This EUA will remain in effect (meaning this test can be used) for the duration of the COVID-19 declaration under Section 564(b)(1) of the Act, 21 U.S.C. section 360bbb-3(b)(1), unless the authorization is terminated or revoked.     Resp Syncytial Virus by PCR NEGATIVE NEGATIVE Final    Comment: (NOTE) Fact Sheet for Patients: BloggerCourse.com  Fact Sheet for Healthcare Providers: SeriousBroker.it  This test is not yet approved or cleared by the [place] FDA and has been authorized for detection and/or diagnosis of SARS-CoV-2 by FDA under an Emergency Use Authorization (EUA). This EUA will remain in effect (meaning this test can be used) for the duration of the COVID-19 declaration under Section 564(b)(1) of the Act, 21 U.S.C. section 360bbb-3(b)(1), unless the authorization is terminated or revoked.  Performed at [facility], [address] [place], [place] [address]   Blood culture (routine x 2)     Status: None (Preliminary result)   Collection Time: 12/08/23 11:19 AM   Specimen: BLOOD  Result Value Ref Range Status   Specimen Description   Final    BLOOD SITE NOT SPECIFIED Performed at [facility], [address], [place], [place] [address]    Special Requests   Final    BOTTLES DRAWN AEROBIC AND ANAEROBIC Blood Culture results may not be  optimal due to an inadequate volume of blood received in culture bottles Performed at [facility], [address], [place], [place] [address]    Culture   Final    NO GROWTH 2 DAYS Performed at [facility], [address] [place], [place] [address]    Report Status PENDING  Incomplete  Blood culture (routine x 2)     Status: None (Preliminary result)   Collection Time: 12/08/23 11:30 AM   Specimen: BLOOD  Result Value Ref Range Status   Specimen Description   Final    BLOOD LEFT ANTECUBITAL Performed at [facility], [address], [place], [place] [address]    Special Requests   Final    BOTTLES DRAWN AEROBIC AND ANAEROBIC Blood Culture results may not be optimal due to an inadequate volume of blood received in culture bottles Performed at [facility], [address], [place], [place] [address]    Culture   Final    NO GROWTH 2 DAYS Performed at [facility], [address] [place], [place] [address]    Report Status PENDING  Incomplete  Culture, Respiratory w Gram Stain     Status: None (Preliminary result)   Collection Time: 12/10/23  2:18 PM   Specimen: Bronchial Alveolar Lavage; Respiratory  Result Value Ref Range Status   Specimen Description   Final    BRONCHIAL ALVEOLAR LAVAGE Performed at [facility], [address], [place], [place] [address]    Special Requests   Final    NONE Performed at [facility], [address] [place], [place] [address]    Gram Stain   Final    RARE WBC PRESENT, PREDOMINANTLY PMN NO ORGANISMS SEEN Performed at [facility], [address], [place], [place] [address]    Culture PENDING  Incomplete   Report Status PENDING  Incomplete     Medications:    Chlorhexidine Gluconate Cloth  6 each Topical Daily   cholecalciferol  5,000 Units Oral Daily   cyanocobalamin  3,000 mcg Oral Daily   diphenhydrAMINE  25 mg Oral  QHS   insulin aspart  0-5 Units Subcutaneous QHS   insulin aspart  0-9 Units Subcutaneous TID WC   levothyroxine  112 mcg Oral QAC breakfast   linaclotide  145 mcg Oral QAC breakfast   magnesium oxide  400 mg Oral Daily   melatonin  10 mg Oral QHS   methylphenidate  36 mg Oral q morning   pantoprazole  40 mg Oral Daily   saccharomyces boulardii  250 mg Oral BID   Continuous Infusions:    LOS: 3 days   Marinda Elk  [facility]  12/11/2023, 7:03 AM

## 2023-12-11 NOTE — Plan of Care (Signed)

## 2023-12-11 NOTE — Consult Note (Signed)
Palliative Care Consult Note                                  Date: 12/11/2023   Patient Name: Mary Erickson  DOB: Jan 07, 1966  MRN: [id number]  Age / Sex: 58 y.o., female  PCP: Barnie Mort, PA-C Referring Physician: Marinda Elk, MD  Reason for Consultation: Establishing goals of care  HPI/Patient Profile: 58 y.o. female  with past medical history of Sjogren's, type 1 diabetes mellitus, tobacco use disorder quit in 2010, who presents with cough and hemoptysis.  Per the patient she had thyroid surgery in April 2024 and had a nagging cough after that which has been persistent.  She was admitted on 12/08/2023 with newly identified mass of the right upper lobe type 1 diabetes, Sjogren's syndrome and others.   Palliative medicine was consulted for GOC conversations.  Past Medical History:  Diagnosis Date   Acid reflux    ADHD (attention deficit hyperactivity disorder)    Anemia    Arthritis    "elbows, hands, neck, shoulders" (10/02/2016)   Calcium blood increased    Chronic neck pain    Chronic UTI (urinary tract infection)    "from self caths" (10/02/2016)   Hashimoto's disease    Hepatitis B    acute hepatitis B from lancet   History of alcoholism (HCC)    10/02/2016 "sober since 03/14/2005"   History of blood transfusion 1984; 1989; 2008   "quite a few"   History of shortness of breath    Hypercholesteremia    Hypothyroidism    no meds   Internal carotid aneurysm dx'd early 2017   Legally blind in left eye, as defined in [place]    [facility])    left upper arm   Migraines    "quite a few in a month; at least 15" (10/02/2016)   Neuropathy    neck; "not sure if it's from DM or Sjogren's" (10/02/2016)   Pneumonia 1986   "1 yr after major OR when I was under anesthesia for 13 hr"   Seizures (HCC) ~ 2004; ~ 2013   "seizures from diabetes "   Self-catheterizes urinary bladder    "since OR in 1984" (10/02/2016)    Sjogren's syndrome (HCC)    Thyroid goiter    "still present" (10/02/2016)   Type II diabetes mellitus (HCC)    Urinary retention    self caths   Wears glasses     Subjective:   This NP Wynne Dust reviewed medical records, received report from team, assessed the patient and then meet at the patient's bedside to discuss diagnosis, prognosis, GOC, EOL wishes disposition and options.  I met with the patient at the bedside, no family was present.   We meet to discuss diagnosis prognosis, GOC, EOL wishes, disposition and options. Concept of Palliative Care was introduced as specialized medical care for people and their families living with serious illness.  If focuses on providing relief from the symptoms and stress of a serious illness.  The goal is to improve quality of life for both the patient and the family. Values and goals of care important to patient and family were attempted to be elicited.  Created space and opportunity for patient  and family to explore thoughts and feelings regarding current medical situation   Natural trajectory and current clinical status were discussed. Questions and concerns addressed. Patient  encouraged to  call with questions or concerns.    Patient/Family Understanding of Illness: She understands she has had a lot of chronic autoimmune diseases.  Initially she had type 1 diabetes which was relatively easy to manage.  She was then diagnosed with Sjogren's which was tough and she had a lot of time questioning "why me".  Then she was diagnosed with Hashimoto's thyroiditis which was tougher still.  She underwent thyroidectomy last April and she has been chasing her labs with medication to try to manage her illnesses.  Now she states there is a question about possible rheumatoid arthritis.  She states she exercises a lot and has terrible fatigue as a result of this.  She also now understands that she has a mass, was coughing up blood and has had a chronic dry cough  for several months.  We spent some time reviewing her current clinical status and details about her situation.  Life Review: She states she has had a full life.  She does not have any children.  She is in touch with her stepmother who lives in [facility] and states that she would want this person Makinsey Pepitone) to be her Horticulturist, commercial.  Patient Values: Quality of life over quantity of life  Goals: She does not want to die in the hospital, she does not want chemotherapy or radiation but is open to immunotherapy.  She does not want to feel sorry for herself.  When is her time she wants to die peacefully at home.  Today's Discussion: In addition to discussion described above we had extensive discussion on various topics.  She has seen family members struggle and suffer at the end of life.  She witnessed her dad with multiple sclerosis who had a peaceful death.  This is compared to her mother and grandmother who she watched suffered terribly in a nursing home.  This seems to have informed her wishes for her own end-of-life.  She states that she understands that this cancer is large and complicated.  She does not want to suffer with chemotherapy and radiation.  She is open to possible immunotherapy if it is offered.  She states that it is in God's hands and when it is her time she is ready to go home.  She does not want to be a "prisoner of the bed."  At the end of our conversation we confirmed her wishes for DNR, she is open to intubation for 3 days or less, no feeding tubes.  She is open to other offered medical treatments and medications.  I recommended we complete advance directive paperwork, which she is agreeable to.  We were able to complete DNR form, MOST form, living will, healthcare power of attorney.  I also completed a goals of care document in Vynca/ACP tab.  All of these documents were uploaded to Vynca/ACP.  I shared that I would follow-up tomorrow to see how she is doing.  I  also highly recommended referral to outpatient palliative care clinic embedded in the cancer center as they are planning for her to follow-up as an outpatient for biopsy results and treatment options.  She is agreeable to this, referral was sent.  I provided emotional and general support through therapeutic listening, empathy, sharing of stories, therapeutic touch, and other techniques. I answered all questions and addressed all concerns to the best of my ability.  Review of Systems  Respiratory:  Negative for cough and shortness of breath.   Cardiovascular:  Negative for chest pain.  Gastrointestinal:  Negative for abdominal pain, nausea and vomiting.    Objective:   Primary Diagnoses: Present on Admission:  Mass of upper lobe of right lung  Type 1 diabetes mellitus (HCC)  Sjogren syndrome (HCC)   Physical Exam Vitals and nursing note reviewed.  Constitutional:      General: She is not in acute distress. HENT:     Head: Normocephalic and atraumatic.  Cardiovascular:     Rate and Rhythm: Normal rate.  Pulmonary:     Effort: Pulmonary effort is normal. No respiratory distress.  Abdominal:     General: Abdomen is flat.  Skin:    General: Skin is warm and dry.  Neurological:     General: No focal deficit present.     Mental Status: She is alert and oriented to person, place, and time.  Psychiatric:        Mood and Affect: Mood normal.        Behavior: Behavior normal.     Vital Signs:  BP (!) 109/53   Pulse 79   Temp 98.2 F (36.8 C) (Oral)   Resp 14   Ht 5\' 8"  (1.727 m)   Wt 61.7 kg   SpO2 97%   BMI 20.68 kg/m   Palliative Assessment/Data: 80%    Advanced Care Planning:   Existing [facility]ynca/ACP Documentation: Uploaded MOST form signed 12/11/2023 Updated DNR effective 12/11/2023 Goals of care document signed 12/11/2023  Primary Decision Maker: PATIENT  Code Status/Advance Care Planning: DNR-interventions desired  A discussion was had today regarding  advanced directives. Concepts specific to code status, artifical feeding and hydration, continued IV antibiotics and rehospitalization was had.  The difference between a aggressive medical intervention path and a palliative comfort care path for this patient at this time was had. The MOST form was introduced and discussed.  We completed a MOST form, as summarized below.  This was uploaded into the Vynca/ACP tab.   I completed a MOST form today. The patient and family outlined their wishes for the following treatment decisions:  Cardiopulmonary Resuscitation: Do Not Attempt Resuscitation (DNR/No CPR)  Medical Interventions: **Intubation only for 3 days or less** Full Scope of Treatment: Use intubation, advanced airway interventions, mechanical ventilation, cardioversion as indicated, medical treatment, IV fluids, etc, also provide comfort measures. Transfer to the hospital if indicated  Antibiotics: Antibiotics if indicated  IV Fluids: IV fluids if indicated  Feeding Tube: No feeding tube     Decisions/Changes to ACP: Changed to DNR, created and uploaded MOST form and goals of care document, completed living will and HCPOA  Assessment & Plan:   Impression: 58 year old female with acute presentation chronic comorbidities as described above.  The patient is in a very serious situation with a large and complex lung tumor.  She does not desire chemotherapy or radiation.  She would be open to possible immunotherapy if offered.  Biopsy is still pending.  She desires DNR status, would be amenable to intubation if it was for 3 days or less.  I got the chaplain to assist in completion of HCPOA documentation and LivingWell documentation.  All of this should be uploaded to the Vynca/ACP shortly.  I encouraged her to discuss her wishes with her stepmom/HCPOA.  I recommended referral to outpatient palliative care clinic embedded in the cancer center, which she is agreeable.  Overall long-term prognosis  poor.  SUMMARY OF RECOMMENDATIONS   DNR-interventions desired Intubation only for 3 days or less if needed No feeding tubes No chemotherapy or radiation  Open to immunotherapy if offered Referral to [facility] for outpatient palliative care/in the cancer center Ongoing emotional support of patient Palliative medicine will continue to follow  Symptom Management:  Per primary team PMT is available to assist as needed  Prognosis:  Unable to determine  Discharge Planning:  To Be Determined   Discussed with: Patient, medical team, nursing team    Thank you for allowing [place] to participate in the care of Berry R Abbett PMT will continue to support holistically.  Time Total: 110 min  Detailed review of medical records (labs, imaging, vital signs), medically appropriate exam, discussed with treatment team, counseling and education to patient, family, & staff, documenting clinical information, medication management, coordination of care  Signed by: Wynne Dust, NP Palliative Medicine Team  Team Phone # [phone] (Nights/Weekends)  12/11/2023, 10:30 AM

## 2023-12-11 NOTE — Plan of Care (Signed)
  Problem: Education: Goal: Ability to describe self-care measures that may prevent or decrease complications (Diabetes Survival Skills Education) will improve Outcome: Progressing Goal: Individualized Educational Video(s) Outcome: Progressing   Problem: Coping: Goal: Ability to adjust to condition or change in health will improve Outcome: Progressing   Problem: Nutritional: Goal: Maintenance of adequate nutrition will improve Outcome: Progressing Goal: Progress toward achieving an optimal weight will improve Outcome: Progressing   Problem: Health Behavior/Discharge Planning: Goal: Ability to manage health-related needs will improve Outcome: Progressing   Problem: Clinical Measurements: Goal: Ability to maintain clinical measurements within normal limits will improve Outcome: Progressing Goal: Will remain free from infection Outcome: Progressing Goal: Diagnostic test results will improve Outcome: Progressing Goal: Respiratory complications will improve Outcome: Progressing Goal: Cardiovascular complication will be avoided Outcome: Progressing   Problem: Activity: Goal: Risk for activity intolerance will decrease Outcome: Progressing

## 2023-12-11 NOTE — Progress Notes (Signed)
NAME:  Mary Erickson, MRN:  [id number], DOB:  1966-03-29, LOS: 3 ADMISSION DATE:  12/08/2023, CONSULTATION DATE:  12/11/2023 REFERRING MD:  Marinda Elk, MD, CHIEF COMPLAINT:  lung mass  History of Present Illness:  Mary Erickson is a 58 year old woman with past medical history of Sjogren's, type 1 diabetes mellitus, tobacco use disorder quit in 2010, who presents with cough and hemoptysis.  She notes having thyroid surgery in April 2024 and a couple of days after that she started having a nagging cough.  She notes the cough has been pretty persistent over the last year but yesterday in the middle of the night she coughed up a teaspoon of bright red blood.  She notes that her cough is worse with any kind of exertion or movement.  She had worsening hemoptysis which is what caused her to present to the ED.  She denies fevers chills night sweats but does note she has a trouble gaining weight over the last few months.  She had a chest x-ray which showed a opacification of the right upper lobe which led to CT scan which shows a large greater than 9 cm mass transversing the right upper lobe fissure into the right middle lobe and involving the right pulmonary artery.  This is highly concerning for primary lung malignancy.  She notes being chronically ill for much of her life with her autoimmune disorders and has seen family members in the healthcare setting.  She does not want to undergo chest compressions under any circumstances but would be open to intubation for reversible treatable causes.  She is not sure she wants aggressive treatment for cancer such as chemotherapy and radiation but may be open to immunotherapy.  Regardless she is open to biopsy options including bronchoscopy if necessary.  PCCM was consulted to assist in management of the pulmonary mass.  Pertinent  Medical History  Sjogren's Type 1 DM Emphysema Quit smoking 2010, 50 pack year smoker with passive smoke  exposure  Significant Hospital Events: Including procedures, antibiotic start and stop dates in addition to other pertinent events   1/26 admitted for hemoptysis 1/28 bronchoscopy, EBUS with tbna  Interim History / Subjective:   No acute events overnight No episodes of hemoptysis She is feeling weel  Objective   Blood pressure (!) 108/41, pulse 80, temperature 98.2 F (36.8 C), temperature source Oral, resp. rate (!) 24, height 5\' 8"  (1.727 m), weight 61.7 kg, SpO2 95%.        Intake/Output Summary (Last 24 hours) at 12/11/2023 0831 Last data filed at 12/10/2023 1429 Gross per 24 hour  Intake 200 ml  Output --  Net 200 ml   Filed Weights   12/08/23 0933 12/10/23 1228  Weight: 61.7 kg 61.7 kg    Examination: General: No acute distress HENT: [place]/AT, moist mucous membranes Lungs: Breath sound diminished right lung otherwise breathing nonlabored no wheeze Cardiovascular: Regular rate and rhythm Abdomen: Soft,  nondistended Extremities: No peripheral edema, diffuse arthritic changes in upper EXTR Neuro: Normal speech no focal asymmetry  Resolved Hospital Problem list     Assessment & Plan:   Right upper lobe lung mass greater than 9 cm enveloping the right pulmonary artery and crossing across the fissure into the right middle lobe Emphysema with 50-pack-year smoking history, quit 15 years ago  Plan - bronchosocpy completed 1/28 - follow up biopsy results and BAL cultures - placed on steroid taper hemoptysis to help with inflammation of airways as well as increased joint pain  due to her sjogrens - safe for discharge from pulmonary standpoint   Labs   CBC: Recent Labs  Lab 12/08/23 1147 12/08/23 1812 12/09/23 0038 12/09/23 0855 12/09/23 1620 12/10/23 0049 12/11/23 0226  WBC 12.2*  --  12.3*  --   --  10.8* 12.2*  NEUTROABS 9.7*  --   --   --   --   --   --   HGB 12.6   < > 10.9* 11.1* 11.4* 10.8* 11.1*  HCT 39.2   < > 33.4* 34.1* 36.0 33.8* 34.4*  MCV 91.0   --  91.0  --   --  92.1 91.0  PLT 422*  --  422*  --   --  396 417*   < > = values in this interval not displayed.    Basic Metabolic Panel: Recent Labs  Lab 12/08/23 1147 12/09/23 0038 12/10/23 0049  NA 135 133* 130*  K 4.2 3.9 3.9  CL 100 100 99  CO2 26 24 21*  GLUCOSE 154* 148* 82  BUN 13 14 13   CREATININE 0.54 0.44 0.84  CALCIUM 9.9 8.6* 8.8*   GFR: Estimated Creatinine Clearance: 72 mL/min (by C-G formula based on SCr of 0.84 mg/dL). Recent Labs  Lab 12/08/23 1147 12/08/23 1249 12/09/23 0038 12/10/23 0049 12/11/23 0226  WBC 12.2*  --  12.3* 10.8* 12.2*  LATICACIDVEN  --  0.8  --   --   --     Liver Function Tests: Recent Labs  Lab 12/08/23 1147 12/09/23 0038 12/10/23 0049  AST 31 23 25   ALT 20 16 15   ALKPHOS 141* 108 105  BILITOT 0.6 0.7 0.7  PROT 9.0* 6.5 6.8  ALBUMIN 4.2 3.2* 3.0*   Recent Labs  Lab 12/08/23 1147  LIPASE 27   No results for input(s): "AMMONIA" in the last 168 hours.  ABG    Component Value Date/Time   TCO2 27 12/07/2019 0607     Coagulation Profile: Recent Labs  Lab 12/08/23 1147  INR 0.9    Cardiac Enzymes: No results for input(s): "CKTOTAL", "CKMB", "CKMBINDEX", "TROPONINI" in the last 168 hours.  HbA1C: Hgb A1c MFr Bld  Date/Time Value Ref Range Status  12/08/2023 06:12 PM 6.9 (H) 4.8 - 5.6 % Final    Comment:    (NOTE) Pre diabetes:          5.7%-6.4%  Diabetes:              >6.4%  Glycemic control for   <7.0% adults with diabetes   03/01/2023 11:53 AM 6.9 (H) 4.8 - 5.6 % Final    Comment:    (NOTE) Pre diabetes:          5.7%-6.4%  Diabetes:              >6.4%  Glycemic control for   <7.0% adults with diabetes     CBG: Recent Labs  Lab 12/10/23 1157 12/10/23 1441 12/10/23 1603 12/10/23 2110 12/11/23 0736  GLUCAP 148* 94 129* 187* 200*   Melody Comas, MD [facility] Office: [phone]   See Amion for personal pager PCCM on call pager [phone] until  7pm. Please call Elink 7p-7a. [phone]

## 2023-12-11 NOTE — Progress Notes (Signed)
   Advanced Care Planning Note   Patient Name: Mary Erickson       Date: 12/11/2023 DOB: [birth year]  Age: 58 y.o. MRN#: [id number] Attending Physician: Marinda Elk, MD Primary Care Physician: Modesta Messing Admit Date: 12/08/2023 Length of Stay: 3 days  Advanced Care Planning Details:   Persons Present: ACP Persons Present: Patient  Provider(s) Present: Palliative Medicine Provider Wynne Dust, NP  All persons present were open and agreeable to conversation about advanced care planning. Education offered on the importance of documentation and the logistics of securing advanced directives.   We had in-depth discussion on treatment options, goals of care, patient wishes, advance care planning.  Education was offered on the following:   Medical treatment options available, Possible risks and benefits of each option/intervention, Available ACP documents able to be completed, and Importance/Benefit of completing ACP documents  Forms completed include:  DNR ([place]), MOST Form, HCPOA, and Living Will These forms can be found in the ACP link in the area under the patient's picture  After conversation, patient and/or family determined the desired discharge disposition at this time would be:  Home with cancer center embedded OP Palliative Care follow-up    Summary of ACP Decisions:   DNR-interventions desired Intubation only for 3 days or less if needed No feeding tubes No chemotherapy or radiation Open to immunotherapy if offered Referral to [facility] for outpatient palliative care/in the cancer center Ongoing emotional support of patient Palliative medicine will continue to follow    Thank you for allowing [place] to participate in the care of [name] PMT will continue to support holistically.  Total ACP Time: 20 min  The time above is specific to time spend on advanced care planning. It does not include any separately billed  services.  Signed by: Wynne Dust, DNP, AGNP-C Palliative Medicine Team  Team Phone # [phone] (Nights/Weekends)  12/11/2023, 5:47 PM

## 2023-12-11 NOTE — Progress Notes (Signed)
Chaplains received a call from palliative care to assist Tecora with getting her advance directives notarized.  The original was returned to her and a copy was placed in her chart and scanned to ACP Documents.  [address] [place] Pager, [phone]

## 2023-12-12 ENCOUNTER — Encounter (HOSPITAL_COMMUNITY): Payer: Self-pay | Admitting: Pulmonary Disease

## 2023-12-12 DIAGNOSIS — Z7189 Other specified counseling: Secondary | ICD-10-CM

## 2023-12-12 DIAGNOSIS — M3500 Sicca syndrome, unspecified: Secondary | ICD-10-CM

## 2023-12-12 DIAGNOSIS — Z515 Encounter for palliative care: Secondary | ICD-10-CM | POA: Diagnosis not present

## 2023-12-12 DIAGNOSIS — M069 Rheumatoid arthritis, unspecified: Secondary | ICD-10-CM

## 2023-12-12 DIAGNOSIS — Z66 Do not resuscitate: Secondary | ICD-10-CM

## 2023-12-12 DIAGNOSIS — J189 Pneumonia, unspecified organism: Secondary | ICD-10-CM

## 2023-12-12 DIAGNOSIS — R918 Other nonspecific abnormal finding of lung field: Secondary | ICD-10-CM | POA: Diagnosis not present

## 2023-12-12 LAB — CBC
HCT: 33.3 % — ABNORMAL LOW (ref 36.0–46.0)
Hemoglobin: 10.9 g/dL — ABNORMAL LOW (ref 12.0–15.0)
MCH: 29.5 pg (ref 26.0–34.0)
MCHC: 32.7 g/dL (ref 30.0–36.0)
MCV: 90 fL (ref 80.0–100.0)
Platelets: 438 10*3/uL — ABNORMAL HIGH (ref 150–400)
RBC: 3.7 MIL/uL — ABNORMAL LOW (ref 3.87–5.11)
RDW: 13.8 % (ref 11.5–15.5)
WBC: 12.8 10*3/uL — ABNORMAL HIGH (ref 4.0–10.5)
nRBC: 0 % (ref 0.0–0.2)

## 2023-12-12 LAB — GLUCOSE, CAPILLARY
Glucose-Capillary: 191 mg/dL — ABNORMAL HIGH (ref 70–99)
Glucose-Capillary: 263 mg/dL — ABNORMAL HIGH (ref 70–99)

## 2023-12-12 MED ORDER — INSULIN GLARGINE-YFGN 100 UNIT/ML ~~LOC~~ SOLN: 16.0000 [IU] | Freq: Every day | SUBCUTANEOUS | Status: DC

## 2023-12-12 MED ORDER — INSULIN DETEMIR 100 UNIT/ML FLEXPEN: 16.0000 [IU] | PEN_INJECTOR | Freq: Every day | SUBCUTANEOUS | Status: DC

## 2023-12-12 MED ORDER — LEVEMIR FLEXTOUCH 100 UNIT/ML ~~LOC~~ SOPN: 4.0000 [IU] | PEN_INJECTOR | Freq: Two times a day (BID) | SUBCUTANEOUS | Status: DC

## 2023-12-12 MED ORDER — INSULIN GLARGINE-YFGN 100 UNIT/ML ~~LOC~~ SOLN
7.0000 [IU] | Freq: Every day | SUBCUTANEOUS | Status: DC
  Administered 2023-12-12: 7 [IU] via SUBCUTANEOUS
  Filled 2023-12-12: qty 0.07

## 2023-12-12 MED ORDER — INSULIN GLARGINE-YFGN 100 UNIT/ML ~~LOC~~ SOLN: 7.0000 [IU] | Freq: Every day | SUBCUTANEOUS | Status: DC

## 2023-12-12 MED ORDER — INSULIN ASPART 100 UNIT/ML IJ SOLN
6.0000 [IU] | Freq: Three times a day (TID) | INTRAMUSCULAR | Status: DC
  Administered 2023-12-12: 6 [IU] via SUBCUTANEOUS

## 2023-12-12 MED ORDER — PREDNISONE 10 MG PO TABS: ORAL_TABLET | ORAL | 0 refills | Status: DC

## 2023-12-12 MED ORDER — PREDNISONE 5 MG PO TABS: 5.0000 mg | ORAL_TABLET | Freq: Two times a day (BID) | ORAL | Status: DC

## 2023-12-12 NOTE — Discharge Summary (Addendum)
Physician Discharge Summary  Mary Erickson WUJ:[id number] DOB: 10/25/1966 DOA: 12/08/2023  PCP: Barnie Mort, PA-C  Admit date: 12/08/2023 Discharge date: 12/12/2023  Admitted From: Home Disposition:  home Recommendations for Outpatient Follow-up:  Follow up with Pulmonary in 1-2 weeks Please obtain BMP/CBC in one week   Home Health:No Equipment/Devices:None  Discharge Condition:Stable CODE STATUS:Full Diet recommendation: Heart Healthy   Brief/Interim Summary:  58 y.o. female past medical history of 50-year pack tobacco use Sjogren's syndrome, rheumatoid arthritis, diabetes mellitus type 1, chronic constipation history of hepatitis B comes into the ED with hemoptysis that started 3 days prior to admission, with nose and throat pain for which she has been taking NSAIDs.  In the ED she was found to be afebrile with a lactic acid of 0.8 mild leukocytosis ESR 58 chest x-ray showed right upper lobe infiltrate, CT angio of the chest was done large mass 9.9 x 9.1 along the superior right lower lobe extending into the helium, abrupt cut off of the apical right subsegmental air artery secondary to the mass and there is groundglass opacity around the mass with hilar lymphadenopathy.  Was started empirically on IV vancomycin and cefepime in the ED goals of care were discussed with her she is unsure whether she wanted chemotherapy, radiation or immunotherapy.   Discharge Diagnoses:  Principal Problem:   Mass of upper lobe of right lung Active Problems:   Type 1 diabetes mellitus (HCC)   Sjogren syndrome (HCC)   Rheumatoid arthritis in remission (HCC)   History of seizures   Chronic constipation   Small intestinal bacterial overgrowth (SIBO)  Right upper lobe lung mass: Concerning for malignancy. She was initially started on steroids and empiric antibiotics. Pulmonary was consulted antibiotics were stopped. They recommended a bronchoscopy performed on 12/10/2023 with biopsy results to  be followed up as an outpatient. They recommended to give her a steroid taper as an outpatient and they will follow-up on biopsy results.  Diabetes mellitus type 1: She was placed on steroids for her long-acting insulin was doubled. And she was continued on sliding scale which she will continue at home. She has been on steroids at home in the past and she is pretty good about managing her diabetes while on steroids. Her A1c is 6.9.  Sjogren syndrome (HCC)/ Rheumatoid arthritis in remission [facility]) She is off chronic steroids continue Tylenol and ibuprofen.   Hypothyroidism: Continue Synthroid.   History of seizures/ADHD: Continue home Adderall she has had no seizure disorder with last several years.   Chronic constipation Started on MiraLAX daily.   Normocytic anemia: Follow-up PCP as an outpatient.   Goals Of care: Patient does not wish to receive CPR. Intubated if needed for temporary measures. She understands the complex nature of the lung mass and wishes to die with dignity, palliative care was consulted    Discharge Instructions  Discharge Instructions     Amb Referral to Palliative Care   Complete by: As directed    Diet - low sodium heart healthy   Complete by: As directed    Increase activity slowly   Complete by: As directed       Allergies as of 12/12/2023       Reactions   Doxycycline Other (See Comments)   Elevates liver enzymes   Aspirin Other (See Comments)   Can take 81 mg but has to be coated; No other Aspirin due to bleeding in stools from ulcers   Ciprofloxacin Hives   Per pt, she can  tolerate oral but not IV   Nsaids Other (See Comments)   GI issues    Amoxicillin-pot Clavulanate Nausea Only   Gluten Meal    Avoid due to autoimmune issues    Lactose    Avoid due to autoimmune issues    Codeine Nausea And Vomiting   Hydrocodone-acetaminophen Nausea And Vomiting   Oxycodone Nausea And Vomiting   Sulfa Antibiotics Nausea And Vomiting    Sulfasalazine Nausea And Vomiting        Medication List     TAKE these medications    Accu-Chek Guide test strip Generic drug: glucose blood 1 each by Other route as needed.   acetaminophen 500 MG tablet Commonly known as: TYLENOL Take 1,000 mg by mouth every 6 (six) hours as needed for moderate pain.   aluminum-magnesium hydroxide 200-200 MG/5ML suspension Take by mouth every 6 (six) hours as needed for indigestion.   B-12 3000 MCG Caps Take 3,000 mcg by mouth daily.   BENADRYL ALLERGY PO Take 1 tablet by mouth at bedtime.   benzocaine 10 % mucosal gel Commonly known as: ORAJEL Use as directed 1 Application in the mouth or throat as needed for mouth pain.   [employer] ALLERGY EYE DROPS OP Place 1 drop into both eyes 2 (two) times daily.   Dexcom G7 Sensor Misc Inject 1 applicator into the skin See admin instructions. Every 10 days   diclofenac Sodium 1 % Gel Commonly known as: VOLTAREN Apply 2 g topically 4 (four) times daily. What changed:  when to take this reasons to take this   fluconazole 150 MG tablet Commonly known as: DIFLUCAN Take 150 mg by mouth See admin instructions. Take 150 mg every 3 days for 3 doses starting on the 15th of every month   ibuprofen 400 MG tablet Commonly known as: ADVIL Take 400 mg by mouth every 6 (six) hours as needed for moderate pain (pain score 4-6).   Insulin lispro 100 UNIT/ML Commonly known as: HUMALOG Junior KwikPen Inject 1-7 Units into the skin as needed (high blood sugar).   insulin detemir 100 UNIT/ML FlexPen Commonly known as: LEVEMIR Inject 16 Units into the skin daily for 6 days. What changed: You were already taking a medication with the same name, and this prescription was added. Make sure you understand how and when to take each.   Levemir FlexTouch 100 UNIT/ML FlexTouch Pen Generic drug: insulin detemir Inject 4 Units into the skin 2 (two) times daily. Start taking on: December 18, 2023 What changed: These  instructions start on December 18, 2023. If you are unsure what to do until then, ask your doctor or other care provider.   levothyroxine 112 MCG tablet Commonly known as: SYNTHROID Take 112 mcg by mouth daily before breakfast.   linaclotide 145 MCG Caps capsule Commonly known as: Linzess Take 1 capsule (145 mcg total) by mouth daily before breakfast.   Magnesium 400 MG Caps Take 400 mg by mouth daily.   Melatonin 10-10 MG Tbcr Take 10 mg by mouth at bedtime.   methylphenidate 36 MG CR tablet Commonly known as: CONCERTA Take 36 mg by mouth every morning.   multivitamins ther. w/minerals Tabs tablet Take 1 tablet by mouth daily.   nystatin cream Commonly known as: MYCOSTATIN Apply 1 Application topically 2 (two) times daily.   omeprazole 20 MG capsule Commonly known as: PRILOSEC Take 1 capsule (20 mg total) by mouth 2 (two) times daily before a meal.   ondansetron 4 MG disintegrating tablet  Commonly known as: Zofran ODT Take 1 tablet (4 mg total) by mouth every 6 (six) hours as needed for nausea or vomiting.   polyethylene glycol powder 17 GM/SCOOP powder Commonly known as: GLYCOLAX/MIRALAX Take 17 g by mouth daily as needed for moderate constipation.   predniSONE 10 MG tablet Commonly known as: DELTASONE Takes 6 tablets for 2 days, then 5 tablets for 2 days, then 4 tablets for 2 days, then 3 tablets for 2 days, then 2 tabs for 2 days, then 1 tab for 2 days, and then stop. What changed: You were already taking a medication with the same name, and this prescription was added. Make sure you understand how and when to take each.   predniSONE 5 MG tablet Commonly known as: DELTASONE Take 1 tablet (5 mg total) by mouth 2 (two) times daily. Start taking on: December 25, 2023 What changed: These instructions start on December 25, 2023. If you are unsure what to do until then, ask your doctor or other care provider.   SYSTANE OP Place 1 drop into both eyes 3 (three) times  daily as needed (dry eyes).   Vitamin D3 125 MCG (5000 UT) Caps Take 5,000 Units by mouth daily.        Allergies  Allergen Reactions   Doxycycline Other (See Comments)    Elevates liver enzymes    Aspirin Other (See Comments)    Can take 81 mg but has to be coated; No other Aspirin due to bleeding in stools from ulcers   Ciprofloxacin Hives    Per pt, she can tolerate oral but not IV   Nsaids Other (See Comments)    GI issues    Amoxicillin-Pot Clavulanate Nausea Only   Gluten Meal     Avoid due to autoimmune issues    Lactose     Avoid due to autoimmune issues    Codeine Nausea And Vomiting   Hydrocodone-Acetaminophen Nausea And Vomiting   Oxycodone Nausea And Vomiting   Sulfa Antibiotics Nausea And Vomiting   Sulfasalazine Nausea And Vomiting    Consultations: Pulmonary and critical care   Procedures/Studies: DG CHEST PORT 1 VIEW Result Date: 12/10/2023 CLINICAL DATA:  Post bronchoscopy with biopsy EXAM: PORTABLE CHEST - 1 VIEW COMPARISON:  12/08/2023 FINDINGS: Stable right upper chest opacity. No pneumothorax. Left lung clear. Heart size and mediastinal contours are within normal limits. No effusion. Surgical clips left axilla. IMPRESSION: 1. No pneumothorax. 2. Stable right upper chest opacity. Electronically Signed   By: Corlis Leak M.D.   On: 12/10/2023 15:10   CT Angio Chest PE W and/or Wo Contrast Result Date: 12/08/2023 CLINICAL DATA:  Hemoptysis with right upper chest opacity on same day chest radiograph EXAM: CT ANGIOGRAPHY CHEST WITH CONTRAST TECHNIQUE: Multidetector CT imaging of the chest was performed using the standard protocol during bolus administration of intravenous contrast. Multiplanar CT image reconstructions and MIPs were obtained to evaluate the vascular anatomy. RADIATION DOSE REDUCTION: This exam was performed according to the departmental dose-optimization program which includes automated exposure control, adjustment of the mA and/or kV according  to patient size and/or use of iterative reconstruction technique. CONTRAST:  75mL OMNIPAQUE IOHEXOL 350 MG/ML SOLN COMPARISON:  Same day chest radiograph FINDINGS: Cardiovascular: The study is high quality for the evaluation of pulmonary embolism. There is abrupt cut off of right upper lobe apical subsegmental pulmonary artery (10:132). There are otherwise no filling defects in the central, lobar, segmental or subsegmental pulmonary artery branches to suggest acute pulmonary embolism.  Great vessels are normal in course and caliber. Normal heart size. No significant pericardial fluid/thickening. Mediastinum/Nodes: Thyroidectomy. Normal esophagus. 18 mm right hilar lymphadenopathy (4:66). Left axillary surgical clips. Lungs/Pleura: The central airways are patent. Moderate effacement of right upper lobe bronchus and bronchials with suspected complete obstruction of at least 1 segmental airway (12:57) secondary to consolidative mass centered within the posterior right upper lobe measuring 9.9 x 9.9 cm (12:57). This mass crosses the major fissure into the superior segment right lower lobe and extends towards the hilum. There is peripheral ground-glass opacity surrounding the mass with irregular nodule in the right apex measuring 1.1 x 0.5 cm (12:22). Mild upper lobe predominant centrilobular emphysema. Subsegmental right middle lobe and lingular atelectasis. No pneumothorax. No pleural effusion. Upper abdomen: Subtle hypodensity within hepatic segment 6 measures 4.0 x 3.2 cm (4:151). Musculoskeletal: No acute or abnormal lytic or blastic osseous lesions. Review of the MIP images confirms the above findings. IMPRESSION: 1. Large consolidative mass centered within the posterior right upper lobe measuring 9.9 x 9.9 cm, crossing the major fissure into the superior segment right lower lobe and extending towards the hilum, highly suspicious for primary lung malignancy. 2. Abrupt cut off of right upper lobe apical subsegmental  pulmonary artery, which may be secondary to mass effect or pulmonary embolism. No other filling defects in the central, lobar, segmental or subsegmental pulmonary artery branches to suggest acute pulmonary embolism. 3. Effacement and obstruction of at least 1 right upper lobe bronchiole by the mass. 4. Peripheral ground-glass opacity surrounding the mass with irregular nodule in the right apex measuring 1.1 x 0.5 cm, which may be infectious/inflammatory or reflect satellite nodule. 5. Right hilar lymphadenopathy, suspicious for metastatic disease. 6. Subtle hypodensity within hepatic segment 6 measures 4.0 x 3.2 cm, incompletely characterized. Recommend further evaluation with nonemergent contrast-enhanced MRI abdomen. 7.  Emphysema (ICD10-J43.9). Electronically Signed   By: Agustin Cree M.D.   On: 12/08/2023 14:42   DG Chest Portable 1 View Result Date: 12/08/2023 CLINICAL DATA:  Hemoptysis. EXAM: PORTABLE CHEST 1 VIEW COMPARISON:  04/16/2021 FINDINGS: Heart size is normal. Large area of poorly defined airspace opacity is seen in the right upper lobe, which could be due to pneumonia or neoplasm. Left lung is clear. No pleural effusion. IMPRESSION: Large area of poorly defined airspace opacity in right upper lobe, which could be due to pneumonia or neoplasm. Clinical correlation is recommended. Consider follow-up chest radiographs or CT. Electronically Signed   By: Danae Orleans M.D.   On: 12/08/2023 10:33   (Echo, Carotid, EGD, Colonoscopy, ERCP)    Subjective: No complaints  Discharge Exam: Vitals:   12/12/23 0000 12/12/23 0400  BP:    Pulse:  64  Resp: (!) 22 20  Temp:    SpO2: 93% 91%   Vitals:   12/11/23 2000 12/11/23 2005 12/12/23 0000 12/12/23 0400  BP:  124/69    Pulse:    64  Resp: (!) 22 14 (!) 22 20  Temp: 98.2 F (36.8 C)     TempSrc: Oral     SpO2: 96%  93% 91%  Weight:      Height:        General: Pt is alert, awake, not in acute distress Cardiovascular: RRR, S1/S2 +, no  rubs, no gallops Respiratory: CTA bilaterally, no wheezing, no rhonchi Abdominal: Soft, NT, ND, bowel sounds + Extremities: no edema, no cyanosis    The results of significant diagnostics from this hospitalization (including imaging, microbiology, ancillary and laboratory)  are listed below for reference.     Microbiology: Recent Results (from the past 240 hours)  Resp panel by RT-PCR (RSV, Flu A&B, Covid) Anterior Nasal Swab     Status: None   Collection Time: 12/08/23 10:41 AM   Specimen: Anterior Nasal Swab  Result Value Ref Range Status   SARS Coronavirus 2 by RT PCR NEGATIVE NEGATIVE Final    Comment: (NOTE) SARS-CoV-2 target nucleic acids are NOT DETECTED.  The SARS-CoV-2 RNA is generally detectable in upper respiratory specimens during the acute phase of infection. The lowest concentration of SARS-CoV-2 viral copies this assay can detect is 138 copies/mL. A negative result does not preclude SARS-Cov-2 infection and should not be used as the sole basis for treatment or other patient management decisions. A negative result may occur with  improper specimen collection/handling, submission of specimen other than nasopharyngeal swab, presence of viral mutation(s) within the areas targeted by this assay, and inadequate number of viral copies(<138 copies/mL). A negative result must be combined with clinical observations, patient history, and epidemiological information. The expected result is Negative.  Fact Sheet for Patients:  BloggerCourse.com  Fact Sheet for Healthcare Providers:  SeriousBroker.it  This test is no t yet approved or cleared by the [place] FDA and  has been authorized for detection and/or diagnosis of SARS-CoV-2 by FDA under an Emergency Use Authorization (EUA). This EUA will remain  in effect (meaning this test can be used) for the duration of the COVID-19 declaration under Section 564(b)(1) of the  Act, 21 U.S.C.section 360bbb-3(b)(1), unless the authorization is terminated  or revoked sooner.       Influenza A by PCR NEGATIVE NEGATIVE Final   Influenza B by PCR NEGATIVE NEGATIVE Final    Comment: (NOTE) The Xpert Xpress SARS-CoV-2/FLU/RSV plus assay is intended as an aid in the diagnosis of influenza from Nasopharyngeal swab specimens and should not be used as a sole basis for treatment. Nasal washings and aspirates are unacceptable for Xpert Xpress SARS-CoV-2/FLU/RSV testing.  Fact Sheet for Patients: BloggerCourse.com  Fact Sheet for Healthcare Providers: SeriousBroker.it  This test is not yet approved or cleared by the [place] FDA and has been authorized for detection and/or diagnosis of SARS-CoV-2 by FDA under an Emergency Use Authorization (EUA). This EUA will remain in effect (meaning this test can be used) for the duration of the COVID-19 declaration under Section 564(b)(1) of the Act, 21 U.S.C. section 360bbb-3(b)(1), unless the authorization is terminated or revoked.     Resp Syncytial Virus by PCR NEGATIVE NEGATIVE Final    Comment: (NOTE) Fact Sheet for Patients: BloggerCourse.com  Fact Sheet for Healthcare Providers: SeriousBroker.it  This test is not yet approved or cleared by the [place] FDA and has been authorized for detection and/or diagnosis of SARS-CoV-2 by FDA under an Emergency Use Authorization (EUA). This EUA will remain in effect (meaning this test can be used) for the duration of the COVID-19 declaration under Section 564(b)(1) of the Act, 21 U.S.C. section 360bbb-3(b)(1), unless the authorization is terminated or revoked.  Performed at [facility], [address], [place], [place] [address]   Blood culture (routine x 2)     Status: None (Preliminary result)   Collection Time: 12/08/23 11:19 AM   Specimen:  BLOOD  Result Value Ref Range Status   Specimen Description   Final    BLOOD SITE NOT SPECIFIED Performed at [facility], [address] [place], [place] [address]    Special Requests  Final    BOTTLES DRAWN AEROBIC AND ANAEROBIC Blood Culture results may not be optimal due to an inadequate volume of blood received in culture bottles Performed at [facility], [address] [place], [place] [address]    Culture   Final    NO GROWTH 3 DAYS Performed at [facility], [address] [place], [place] [address]    Report Status PENDING  Incomplete  Blood culture (routine x 2)     Status: None (Preliminary result)   Collection Time: 12/08/23 11:30 AM   Specimen: BLOOD  Result Value Ref Range Status   Specimen Description   Final    BLOOD LEFT ANTECUBITAL Performed at [facility], [address] [place], [place] [address]    Special Requests   Final    BOTTLES DRAWN AEROBIC AND ANAEROBIC Blood Culture results may not be optimal due to an inadequate volume of blood received in culture bottles Performed at [facility], [address] [place], [place] [address]    Culture   Final    NO GROWTH 3 DAYS Performed at [facility], [address], [place], [place] [address]    Report Status PENDING  Incomplete  Culture, Respiratory w Gram Stain     Status: None (Preliminary result)   Collection Time: 12/10/23  2:18 PM   Specimen: Bronchial Alveolar Lavage; Respiratory  Result Value Ref Range Status   Specimen Description   Final    BRONCHIAL ALVEOLAR LAVAGE Performed at [facility], [address], [place], [place] [address]    Special Requests   Final    NONE Performed at [facility], [address] [place], [place] [address]    Gram Stain   Final    RARE WBC PRESENT, PREDOMINANTLY PMN NO ORGANISMS SEEN    Culture   Final    NO GROWTH < 24  HOURS Performed at [facility], [address] [place], [place] [address]    Report Status PENDING  Incomplete  Acid Fast Smear (AFB)     Status: None   Collection Time: 12/10/23  2:18 PM   Specimen: Bronchial Alveolar Lavage; Respiratory  Result Value Ref Range Status   AFB Specimen Processing Concentration  Final   Acid Fast Smear Negative  Final    Comment: (NOTE) Performed At: [facility] [address] [place], [place] [id number] Jolene Schimke MD ZD:[id number]    Source (AFB) BRONCHIAL ALVEOLAR LAVAGE  Final    Comment: Performed at [facility], [address], [place], [place] [address]     Labs: BNP (last 3 results) No results for input(s): "BNP" in the last 8760 hours. Basic Metabolic Panel: Recent Labs  Lab 12/08/23 1147 12/09/23 0038 12/10/23 0049  NA 135 133* 130*  K 4.2 3.9 3.9  CL 100 100 99  CO2 26 24 21*  GLUCOSE 154* 148* 82  BUN 13 14 13   CREATININE 0.54 0.44 0.84  CALCIUM 9.9 8.6* 8.8*   Liver Function Tests: Recent Labs  Lab 12/08/23 1147 12/09/23 0038 12/10/23 0049  AST 31 23 25   ALT 20 16 15   ALKPHOS 141* 108 105  BILITOT 0.6 0.7 0.7  PROT 9.0* 6.5 6.8  ALBUMIN 4.2 3.2* 3.0*   Recent Labs  Lab 12/08/23 1147  LIPASE 27   No results for input(s): "AMMONIA" in the last 168 hours. CBC: Recent Labs  Lab 12/08/23 1147 12/08/23 1812  12/09/23 0038 12/09/23 0855 12/09/23 1620 12/10/23 0049 12/11/23 0226 12/12/23 0238  WBC 12.2*  --  12.3*  --   --  10.8* 12.2* 12.8*  NEUTROABS 9.7*  --   --   --   --   --   --   --   HGB 12.6   < > 10.9* 11.1* 11.4* 10.8* 11.1* 10.9*  HCT 39.2   < > 33.4* 34.1* 36.0 33.8* 34.4* 33.3*  MCV 91.0  --  91.0  --   --  92.1 91.0 90.0  PLT 422*  --  422*  --   --  396 417* 438*   < > = values in this interval not displayed.   Cardiac Enzymes: No results for input(s): "CKTOTAL", "CKMB", "CKMBINDEX", "TROPONINI" in the last 168 hours. BNP: Invalid input(s):  "POCBNP" CBG: Recent Labs  Lab 12/11/23 1244 12/11/23 1602 12/11/23 1730 12/11/23 2138 12/12/23 0238  GLUCAP 74 153* 258* 211* 191*   D-Dimer No results for input(s): "DDIMER" in the last 72 hours. Hgb A1c No results for input(s): "HGBA1C" in the last 72 hours. Lipid Profile No results for input(s): "CHOL", "HDL", "LDLCALC", "TRIG", "CHOLHDL", "LDLDIRECT" in the last 72 hours. Thyroid function studies No results for input(s): "TSH", "T4TOTAL", "T3FREE", "THYROIDAB" in the last 72 hours.  Invalid input(s): "FREET3" Anemia work up No results for input(s): "VITAMINB12", "FOLATE", "FERRITIN", "TIBC", "IRON", "RETICCTPCT" in the last 72 hours. Urinalysis    Component Value Date/Time   COLORURINE COLORLESS (A) 03/07/2023 1531   APPEARANCEUR CLEAR 03/07/2023 1531   LABSPEC 1.003 (L) 03/07/2023 1531   PHURINE 8.0 03/07/2023 1531   GLUCOSEU NEGATIVE 03/07/2023 1531   HGBUR NEGATIVE 03/07/2023 1531   BILIRUBINUR NEGATIVE 03/07/2023 1531   KETONESUR NEGATIVE 03/07/2023 1531   PROTEINUR NEGATIVE 03/07/2023 1531   UROBILINOGEN 0.2 11/21/2022 1337   NITRITE NEGATIVE 03/07/2023 1531   LEUKOCYTESUR TRACE (A) 03/07/2023 1531   Sepsis Labs Recent Labs  Lab 12/09/23 0038 12/10/23 0049 12/11/23 0226 12/12/23 0238  WBC 12.3* 10.8* 12.2* 12.8*   Microbiology Recent Results (from the past 240 hours)  Resp panel by RT-PCR (RSV, Flu A&B, Covid) Anterior Nasal Swab     Status: None   Collection Time: 12/08/23 10:41 AM   Specimen: Anterior Nasal Swab  Result Value Ref Range Status   SARS Coronavirus 2 by RT PCR NEGATIVE NEGATIVE Final    Comment: (NOTE) SARS-CoV-2 target nucleic acids are NOT DETECTED.  The SARS-CoV-2 RNA is generally detectable in upper respiratory specimens during the acute phase of infection. The lowest concentration of SARS-CoV-2 viral copies this assay can detect is 138 copies/mL. A negative result does not preclude SARS-Cov-2 infection and should not be used  as the sole basis for treatment or other patient management decisions. A negative result may occur with  improper specimen collection/handling, submission of specimen other than nasopharyngeal swab, presence of viral mutation(s) within the areas targeted by this assay, and inadequate number of viral copies(<138 copies/mL). A negative result must be combined with clinical observations, patient history, and epidemiological information. The expected result is Negative.  Fact Sheet for Patients:  BloggerCourse.com  Fact Sheet for Healthcare Providers:  SeriousBroker.it  This test is no t yet approved or cleared by the [place] FDA and  has been authorized for detection and/or diagnosis of SARS-CoV-2 by FDA under an Emergency Use Authorization (EUA). This EUA will remain  in effect (meaning this test can be used) for the duration of the COVID-19 declaration under Section  564(b)(1) of the Act, 21 U.S.C.section 360bbb-3(b)(1), unless the authorization is terminated  or revoked sooner.       Influenza A by PCR NEGATIVE NEGATIVE Final   Influenza B by PCR NEGATIVE NEGATIVE Final    Comment: (NOTE) The Xpert Xpress SARS-CoV-2/FLU/RSV plus assay is intended as an aid in the diagnosis of influenza from Nasopharyngeal swab specimens and should not be used as a sole basis for treatment. Nasal washings and aspirates are unacceptable for Xpert Xpress SARS-CoV-2/FLU/RSV testing.  Fact Sheet for Patients: BloggerCourse.com  Fact Sheet for Healthcare Providers: SeriousBroker.it  This test is not yet approved or cleared by the [place] FDA and has been authorized for detection and/or diagnosis of SARS-CoV-2 by FDA under an Emergency Use Authorization (EUA). This EUA will remain in effect (meaning this test can be used) for the duration of the COVID-19 declaration under Section 564(b)(1)  of the Act, 21 U.S.C. section 360bbb-3(b)(1), unless the authorization is terminated or revoked.     Resp Syncytial Virus by PCR NEGATIVE NEGATIVE Final    Comment: (NOTE) Fact Sheet for Patients: BloggerCourse.com  Fact Sheet for Healthcare Providers: SeriousBroker.it  This test is not yet approved or cleared by the [place] FDA and has been authorized for detection and/or diagnosis of SARS-CoV-2 by FDA under an Emergency Use Authorization (EUA). This EUA will remain in effect (meaning this test can be used) for the duration of the COVID-19 declaration under Section 564(b)(1) of the Act, 21 U.S.C. section 360bbb-3(b)(1), unless the authorization is terminated or revoked.  Performed at [facility], [address] [place], [place] [address]   Blood culture (routine x 2)     Status: None (Preliminary result)   Collection Time: 12/08/23 11:19 AM   Specimen: BLOOD  Result Value Ref Range Status   Specimen Description   Final    BLOOD SITE NOT SPECIFIED Performed at [facility], [address], [place], [place] [address]    Special Requests   Final    BOTTLES DRAWN AEROBIC AND ANAEROBIC Blood Culture results may not be optimal due to an inadequate volume of blood received in culture bottles Performed at [facility], [address], [place], [place] [address]    Culture   Final    NO GROWTH 3 DAYS Performed at [facility], [address], [place], [place] [address]    Report Status PENDING  Incomplete  Blood culture (routine x 2)     Status: None (Preliminary result)   Collection Time: 12/08/23 11:30 AM   Specimen: BLOOD  Result Value Ref Range Status   Specimen Description   Final    BLOOD LEFT ANTECUBITAL Performed at [facility], [address], [name], [place] [address]    Special Requests   Final    BOTTLES DRAWN AEROBIC AND  ANAEROBIC Blood Culture results may not be optimal due to an inadequate volume of blood received in culture bottles Performed at [facility], [address] [place], [place] [address]    Culture   Final    NO GROWTH 3 DAYS Performed at [facility], [address], [place], [place] [address]    Report Status PENDING  Incomplete  Culture, Respiratory w Gram Stain     Status: None (Preliminary result)   Collection Time: 12/10/23  2:18 PM   Specimen: Bronchial Alveolar Lavage; Respiratory  Result Value Ref Range Status   Specimen Description  Final    BRONCHIAL ALVEOLAR LAVAGE Performed at [facility], [address], [place], [place] [address]    Special Requests   Final    NONE Performed at [facility], [address], [place], [place] [address]    Gram Stain   Final    RARE WBC PRESENT, PREDOMINANTLY PMN NO ORGANISMS SEEN    Culture   Final    NO GROWTH < 24 HOURS Performed at [facility], [address] [place], [place] [address]    Report Status PENDING  Incomplete  Acid Fast Smear (AFB)     Status: None   Collection Time: 12/10/23  2:18 PM   Specimen: Bronchial Alveolar Lavage; Respiratory  Result Value Ref Range Status   AFB Specimen Processing Concentration  Final   Acid Fast Smear Negative  Final    Comment: (NOTE) Performed At: [facility] [address] [place], [place] [id number] Jolene Schimke MD OZ:[id number]    Source (AFB) BRONCHIAL ALVEOLAR LAVAGE  Final    Comment: Performed at [facility], [address], [place], [place] [address]     Time coordinating discharge: Over 35 minutes  SIGNED:   Marinda Elk, MD  [facility] 12/12/2023, 7:23 AM Pager   If 7PM-7AM, please contact night-coverage [email or web address] Password [email or web address]

## 2023-12-12 NOTE — Plan of Care (Signed)

## 2023-12-12 NOTE — Plan of Care (Signed)
Patient discharged to home states understands instructions.

## 2023-12-12 NOTE — Progress Notes (Signed)
Daily Progress Note   Patient Name: Mary Erickson       Date: 12/12/2023 DOB: Apr 07, 1966  Age: 58 y.o. MRN#: [id number] Attending Physician: Marinda Elk, MD Primary Care Physician: Modesta Messing Admit Date: 12/08/2023 Length of Stay: 4 days  Reason for Consultation/Follow-up: Establishing goals of care  HPI/Patient Profile:  58 y.o. female  with past medical history of Sjogren's, type 1 diabetes mellitus, tobacco use disorder quit in 2010, who presents with cough and hemoptysis.  Per the patient she had thyroid surgery in April 2024 and had a nagging cough after that which has been persistent.  She was admitted on 12/08/2023 with newly identified mass of the right upper lobe type 1 diabetes, Sjogren's syndrome and others.    Palliative medicine was consulted for GOC conversations.  Subjective:   Subjective: Chart Reviewed. Updates received. Patient Assessed. Created space and opportunity for patient  and family to explore thoughts and feelings regarding current medical situation.  Today's Discussion: Today saw the patient at bedside, she excited because she is being discharged today.  She feels good overall.  She does note a cough, which was present during my visit.  We discussed plan to follow-up with pulmonary for biopsy results in about a week.  Anticipate referral to cancer center as well.  I informed her that I did successfully place a referral to ED, NP with the cancer center and that it palliative medicine/comfort management clinic.  I obtained a copy of her advance directives and ensure that those were uploaded into Vynca/ACP tab.  We discussed ongoing goals including DNR, no chemotherapy, no radiation.  She is open to immunotherapy if offered/appropriate.  Otherwise her main goal is when it is her time to die peacefully at home.  I shared that palliative medicine can help and to transition to hospice when it is time and she is agreeable.  She appreciated our  efforts.  I provided emotional and general support through therapeutic listening, empathy, sharing of stories, therapeutic touch, and other techniques. I answered all questions and addressed all concerns to the best of my ability.  Review of Systems  Respiratory:  Positive for cough. Negative for shortness of breath.   Cardiovascular:  Negative for chest pain.  Gastrointestinal:  Negative for abdominal pain, nausea and vomiting.    Objective:   Vital Signs:  BP 124/69   Pulse 64   Temp 98.2 F (36.8 C) (Oral)   Resp 20   Ht 5\' 8"  (1.727 m)   Wt 61.7 kg   SpO2 91%   BMI 20.68 kg/m   Physical Exam Vitals and nursing note reviewed.  Constitutional:      General: She is not in acute distress. HENT:     Head: Normocephalic and atraumatic.  Pulmonary:     Effort: Pulmonary effort is normal. No respiratory distress.  Abdominal:     General: Abdomen is flat.  Skin:    General: Skin is warm and dry.  Neurological:     General: No focal deficit present.     Mental Status: She is alert and oriented to person, place, and time.  Psychiatric:        Mood and Affect: Mood normal.        Behavior: Behavior normal.     Palliative Assessment/Data: 80-90%    Existing Vynca/ACP Documentation: Most form signed 09/09/2024 Advanced directive signed 12/11/2023 DNR effective 12/11/2023 Goals of care document signed 12/11/2023  Assessment & Plan:   Impression: Present  on Admission:  Mass of upper lobe of right lung  Type 1 diabetes mellitus (HCC)  Sjogren syndrome (HCC)  58 year old female with acute presentation chronic comorbidities as described above. The patient is in a very serious situation with a large and complex lung tumor. She does not desire chemotherapy or radiation. She would be open to possible immunotherapy if offered. Biopsy is still pending. She desires DNR status, would be amenable to intubation if it was for 3 days or less. I got the chaplain to assist in  completion of HCPOA documentation and [employer] documentation. All of this should be uploaded to the Vynca/ACP shortly. I encouraged her to discuss her wishes with her stepmom/HCPOA. I recommended referral to outpatient palliative care clinic embedded in the cancer center, which she is agreeable. Overall long-term prognosis poor.   SUMMARY OF RECOMMENDATIONS   DNR-interventions desired Intubation only for 3 days or less if needed No feeding tubes No chemotherapy or radiation Open the immunotherapy if offered Referred to [facility] for outpatient palliative care/in the cancer center  Anticipate discharge today  Symptom Management:  Per primary team PMT is available to assist as needed  Code Status: DNR-interventions desired  Prognosis: Unable to determine  Discharge Planning:  Home with outpatient palliative care in cancer center  Discussed with: Patient, medical team, nursing team  Thank you for allowing [place] to participate in the care of Oluwadarasimi R Maler PMT will continue to support holistically.  Time Total: 30 min  Detailed review of medical records (labs, imaging, vital signs), medically appropriate exam, discussed with treatment team, counseling and education to patient, family, & staff, documenting clinical information, medication management, coordination of care  Wynne Dust, NP Palliative Medicine Team  Team Phone # [phone] (Nights/Weekends)  07/11/2021, 8:17 AM

## 2023-12-13 LAB — CULTURE, BLOOD (ROUTINE X 2)
Culture: NO GROWTH
Culture: NO GROWTH

## 2023-12-13 LAB — CULTURE, RESPIRATORY W GRAM STAIN: Culture: NO GROWTH

## 2023-12-16 ENCOUNTER — Telehealth: Payer: Self-pay | Admitting: Pulmonary Disease

## 2023-12-16 DIAGNOSIS — C3491 Malignant neoplasm of unspecified part of right bronchus or lung: Secondary | ICD-10-CM

## 2023-12-16 NOTE — Telephone Encounter (Signed)
Patient called to make an HFU after being released from ICU. Specifically requested to see Mary Erickson as he performed her bronch and saw her while in hospital. Scheduled for soonest opening on 3/19. Dr Francine Graven, is she okay to wait this long or would you like to work her in in one of your holds? She was offered to see an App, and she declined as she only wants to meet with you. Please advise.

## 2023-12-17 LAB — PROTEIN ELECTROPHORESIS, SERUM
A/G Ratio: 0.8 (ref 0.7–1.7)
Albumin ELP: 3.6 g/dL (ref 2.9–4.4)
Alpha-1-Globulin: 0.4 g/dL (ref 0.0–0.4)
Alpha-2-Globulin: 1 g/dL (ref 0.4–1.0)
Beta Globulin: 1.1 g/dL (ref 0.7–1.3)
Gamma Globulin: 1.8 g/dL (ref 0.4–1.8)
Globulin, Total: 4.4 g/dL — ABNORMAL HIGH (ref 2.2–3.9)
Total Protein ELP: 8 g/dL (ref 6.0–8.5)

## 2023-12-17 LAB — CYTOLOGY - NON PAP

## 2023-12-18 NOTE — Telephone Encounter (Signed)
 I informed patient of her lung cancer diagnosis, of non-small cell lung adenocarcinoma. We will refer her to oncology for further management and treatment.   Patient does not need scheduled follow up in our clinic, only as needed.  Dorn Chill, MD [facility] Office: [phone]

## 2023-12-20 NOTE — Progress Notes (Signed)
 I called the pt to introduce myself and her nurse navigator and explained my role in her cancer care. She is aware of her referral to Dr Sherrod. I offered the pt an appt on 2/12 for labs at 1:45 and an appt with Dr sherrod at 2:15.  Pt states she is available. Pts referral forwarded to new pt scheduler and is aware I have already spoken to the pt.  Pt is aware of our location and has a means of transportation. I provided my direct number to the pt if she has any questions or concerns.

## 2023-12-22 ENCOUNTER — Ambulatory Visit (HOSPITAL_COMMUNITY)
Admission: EM | Admit: 2023-12-22 | Discharge: 2023-12-22 | Disposition: A | Discharge status: Home / Self Care | Payer: Medicaid Other | Attending: Physician Assistant | Admitting: Physician Assistant

## 2023-12-22 ENCOUNTER — Telehealth (HOSPITAL_COMMUNITY): Payer: Self-pay

## 2023-12-22 ENCOUNTER — Encounter (HOSPITAL_COMMUNITY): Payer: Self-pay | Admitting: Emergency Medicine

## 2023-12-22 DIAGNOSIS — R319 Hematuria, unspecified: Secondary | ICD-10-CM | POA: Diagnosis present

## 2023-12-22 DIAGNOSIS — N390 Urinary tract infection, site not specified: Secondary | ICD-10-CM | POA: Insufficient documentation

## 2023-12-22 LAB — POCT URINALYSIS DIP (MANUAL ENTRY)
Bilirubin, UA: NEGATIVE
Glucose, UA: NEGATIVE mg/dL
Ketones, POC UA: NEGATIVE mg/dL
Nitrite, UA: NEGATIVE
Protein Ur, POC: NEGATIVE mg/dL
Spec Grav, UA: 1.01 (ref 1.010–1.025)
Urobilinogen, UA: 0.2 U/dL
pH, UA: 7.5 (ref 5.0–8.0)

## 2023-12-22 MED ORDER — CIPROFLOXACIN HCL 250 MG PO TABS: 250.0000 mg | ORAL_TABLET | Freq: Two times a day (BID) | ORAL | 0 refills | Status: DC

## 2023-12-22 MED ORDER — CIPROFLOXACIN HCL 250 MG PO TABS: 250.0000 mg | ORAL_TABLET | Freq: Two times a day (BID) | ORAL | 0 refills | Status: AC

## 2023-12-22 NOTE — Discharge Instructions (Signed)
 Your urine dip results were suspicious for UTI.  We have sent off a urine culture for more definitive evaluation.  I have sent in a medication called ciprofloxacin  for you to take twice per day for 5 days.  Please finish the entire course of medication unless you are instructed by a provider to stop or develop an allergic reaction.  Please make sure you are staying well-hydrated and avoid holding urine for prolonged periods of time.  If you start to develop fevers, more significant pain, abdominal pain, difficulty urinating, flank pain please go to the emergency room for further evaluation and potential management.

## 2023-12-22 NOTE — ED Provider Notes (Signed)
 [facility]    CSN: [id number] Arrival date & time: 12/22/23  1123      History   Chief Complaint Chief Complaint  Patient presents with   Urinary Frequency   Dysuria    HPI Mary Erickson is a 58 y.o. female.   HPI   Patient reports that she has had cloudy urine, urinary discomfort and frequency for the past 3 days. She denies enuresis or difficulty urinating and reports that she self caths. She denies fever, flank pain, vaginal pain, vaginal bleeding, discharge.   Past Medical History:  Diagnosis Date   Acid reflux    ADHD (attention deficit hyperactivity disorder)    Anemia    Arthritis    elbows, hands, neck, shoulders (10/02/2016)   Calcium  blood increased    Chronic neck pain    Chronic UTI (urinary tract infection)    from self caths (10/02/2016)   Hashimoto's disease    Hepatitis B    acute hepatitis B from lancet   History of alcoholism (HCC)    10/02/2016 sober since 03/14/2005   History of blood transfusion 1984; 1989; 2008   quite a few   History of shortness of breath    Hypercholesteremia    Hypothyroidism    no meds   Internal carotid aneurysm dx'd early 2017   Legally blind in left eye, as defined in [place]     Melanoma (HCC)    left upper arm   Migraines    quite a few in a month; at least 15 (10/02/2016)   Neuropathy    neck; not sure if it's from DM or Sjogren's (10/02/2016)   Pneumonia 1986   1 yr after major OR when I was under anesthesia for 13 hr   Seizures (HCC) ~ 2004; ~ 2013   seizures from diabetes    Self-catheterizes urinary bladder    since OR in 1984 (10/02/2016)   Sjogren's syndrome (HCC)    Thyroid  goiter    still present (10/02/2016)   Type II diabetes mellitus (HCC)    Urinary retention    self caths   Wears glasses     Patient Active Problem List   Diagnosis Date Noted   Rheumatoid arthritis in remission (HCC) 12/11/2023   History of seizures 12/11/2023   Chronic constipation  12/11/2023   Small intestinal bacterial overgrowth (SIBO) 12/11/2023   Mass of upper lobe of right lung 12/08/2023   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   Goiter, toxic, multinodular 02/24/2023   Hyponatremia 04/17/2021   IBS (irritable bowel syndrome) 04/17/2021   Dizziness 12/11/2019   Colitis 12/11/2019   Abnormal CT scan, colon    Acute colitis 12/08/2019   Orthostatic hypotension 12/08/2019   Type 1 diabetes mellitus (HCC) 06/30/2019   Hashimoto's disease 06/30/2019   Sjogren syndrome (HCC) 06/30/2019   Hx of Hypertension 06/30/2019   Neurogenic bladder 06/30/2019   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) 10/02/2016    Past Surgical History:  Procedure Laterality Date   ABDOMINAL HYSTERECTOMY  2008   BILATERAL CARPAL TUNNEL RELEASE Bilateral    BRONCHIAL WASHINGS  12/10/2023   Procedure: BRONCHIAL WASHINGS;  Surgeon: Kara Dorn NOVAK, MD;  Location: [facility];  Service: Pulmonary;;   ECTOPIC PREGNANCY SURGERY  1989   ENDOBRONCHIAL ULTRASOUND N/A 12/10/2023   Procedure: ENDOBRONCHIAL ULTRASOUND;  Surgeon: Kara Dorn NOVAK, MD;  Location: [facility];  Service: Pulmonary;  Laterality: N/A;   EXCISION MELANOMA WITH SENTINEL LYMPH NODE BIOPSY Left 10/02/2016   Procedure:  WIDE LOCAL EXCISION AND ADVANCEMENT FLAP CLOSURE LEFT UPPER  MELANOMA WITH SENTINEL LYMPH NODE MAPPING AND BIOPSY;  Surgeon: Jina Nephew, MD;  Location: [facility];  Service: General;  Laterality: Left;   EYE SURGERY     FINE NEEDLE ASPIRATION  12/10/2023   Procedure: FINE NEEDLE ASPIRATION (FNA) LINEAR;  Surgeon: Kara Dorn NOVAK, MD;  Location: [facility];  Service: Pulmonary;;   LAPAROSCOPIC CHOLECYSTECTOMY  ~ 01/2016   MELANOMA EXCISION WITH SENTINEL LYMPH NODE BIOPSY Left 10/02/2016   WIDE LOCAL EXCISION AND ADVANCEMENT FLAP CLOSURE LEFT UPPER  MELANOMA WITH SENTINEL LYMPH NODE MAPPING AND BIOPSY   OVARIAN CYST REMOVAL     SHOULDER ARTHROSCOPY W/ ROTATOR CUFF REPAIR Right 2014   partial    STRABISMUS SURGERY Left 1971   STRABISMUS SURGERY Left 03/18/2020   Procedure: STRABISMUS REPAIR;  Surgeon: Neysa Fallow, MD;  Location: [facility];  Service: Ophthalmology;  Laterality: Left;   THYROIDECTOMY N/A 03/01/2023   Procedure: TOTAL THYROIDECTOMY;  Surgeon: Eletha Boas, MD;  Location: [facility];  Service: General;  Laterality: N/A;   TUMOR EXCISION  1984   from tail bone and part of tail bone removed   VIDEO BRONCHOSCOPY N/A 12/10/2023   Procedure: VIDEO BRONCHOSCOPY WITHOUT FLUORO;  Surgeon: Kara Dorn NOVAK, MD;  Location: [facility];  Service: Pulmonary;  Laterality: N/A;    OB History   No obstetric history on file.      Home Medications    Prior to Admission medications   Medication Sig Start Date End Date Taking? Authorizing Provider  ciprofloxacin  (CIPRO ) 250 MG tablet Take 1 tablet (250 mg total) by mouth 2 (two) times daily for 5 days. 12/22/23 12/27/23 Yes Legacy Lacivita E, PA-C  acetaminophen  (TYLENOL ) 500 MG tablet Take 1,000 mg by mouth every 6 (six) hours as needed for moderate pain.    [provider]  aluminum-magnesium  hydroxide 200-200 MG/5ML suspension Take by mouth every 6 (six) hours as needed for indigestion.    [provider]  benzocaine  (ORAJEL) 10 % mucosal gel Use as directed 1 Application in the mouth or throat as needed for mouth pain.    [provider]  Cholecalciferol  (VITAMIN D3) 125 MCG (5000 UT) capsule Take 5,000 Units by mouth daily.    [provider]  Continuous Glucose Sensor (DEXCOM G7 SENSOR) MISC Inject 1 applicator into the skin See admin instructions. Every 10 days 11/27/23   [provider]  Cyanocobalamin  (B-12) 3000 MCG CAPS Take 3,000 mcg by mouth daily.    [provider]  diclofenac  Sodium (VOLTAREN ) 1 % GEL Apply 2 g topically 4 (four) times daily. Patient taking differently: Apply 2 g topically 4 (four) times daily as needed (pain). 12/17/19   Darr, Jacob, PA-C   diphenhydrAMINE  HCl (BENADRYL  ALLERGY PO) Take 1 tablet by mouth at bedtime.    [provider]  fluconazole  (DIFLUCAN ) 150 MG tablet Take 150 mg by mouth See admin instructions. Take 150 mg every 3 days for 3 doses starting on the 15th of every month    [provider]  glucose blood (ACCU-CHEK GUIDE) test strip 1 each by Other route as needed. 06/05/23   [provider]  ibuprofen  (ADVIL ) 400 MG tablet Take 400 mg by mouth every 6 (six) hours as needed for moderate pain (pain score 4-6).    [provider]  insulin  detemir (LEVEMIR  FLEXTOUCH) 100 UNIT/ML FlexPen Inject 4 Units into the skin 2 (two) times daily. 12/18/23   Odell  Celinda Balo, MD  insulin  detemir (LEVEMIR ) 100 UNIT/ML FlexPen Inject 16 Units into the skin daily for 6 days. 12/12/23 12/18/23  Odell Celinda Balo, MD  Insulin  lispro (HUMALOG JUNIOR [facility]) 100 UNIT/ML Inject 1-7 Units into the skin as needed (high blood sugar).    [provider]  Ketotifen  Fumarate ([employer] ALLERGY EYE DROPS OP) Place 1 drop into both eyes 2 (two) times daily.    [provider]  levothyroxine  (SYNTHROID ) 112 MCG tablet Take 112 mcg by mouth daily before breakfast.    [provider]  linaclotide  (LINZESS ) 145 MCG CAPS capsule Take 1 capsule (145 mcg total) by mouth daily before breakfast. 11/14/23   Federico Rosario BROCKS, MD  Magnesium  400 MG CAPS Take 400 mg by mouth daily.    [provider]  Melatonin 10-10 MG TBCR Take 10 mg by mouth at bedtime.    [provider]  methylphenidate  36 MG PO CR tablet Take 36 mg by mouth every morning.    [provider]  Multiple Vitamins-Minerals (MULTIVITAMINS THER. W/MINERALS) TABS Take 1 tablet by mouth daily.    [provider]  nystatin cream (MYCOSTATIN) Apply 1 Application topically 2 (two) times daily.    [provider]  omeprazole  (PRILOSEC) 20 MG capsule Take 1 capsule (20 mg total) by mouth 2 (two) times  daily before a meal. 11/14/23   Federico Rosario BROCKS, MD  ondansetron  (ZOFRAN  ODT) 4 MG disintegrating tablet Take 1 tablet (4 mg total) by mouth every 6 (six) hours as needed for nausea or vomiting. 06/18/23   Esterwood, Amy S, PA-C  Polyethyl Glycol-Propyl Glycol (SYSTANE OP) Place 1 drop into both eyes 3 (three) times daily as needed (dry eyes).    [provider]  polyethylene glycol powder (GLYCOLAX /MIRALAX ) 17 GM/SCOOP powder Take 17 g by mouth daily as needed for moderate constipation.    [provider]  predniSONE  (DELTASONE ) 10 MG tablet Takes 6 tablets for 2 days, then 5 tablets for 2 days, then 4 tablets for 2 days, then 3 tablets for 2 days, then 2 tabs for 2 days, then 1 tab for 2 days, and then stop. 12/12/23   Odell Celinda Balo, MD  predniSONE  (DELTASONE ) 5 MG tablet Take 1 tablet (5 mg total) by mouth 2 (two) times daily. 12/25/23   Odell Celinda Balo, MD    Family History Family History  Problem Relation Age of Onset   Multiple sclerosis Father    Colon cancer Neg Hx    Stomach cancer Neg Hx    Colon polyps Neg Hx    Esophageal cancer Neg Hx    Rectal cancer Neg Hx     Social History Social History   Tobacco Use   Smoking status: Former    Current packs/day: 0.00    Average packs/day: 1.5 packs/day for 24.0 years (36.0 ttl pk-yrs)    Types: Cigarettes    Start date: 49    Quit date: 2008    Years since quitting: 17.1   Smokeless tobacco: Never  Vaping Use   Vaping status: Never Used  Substance Use Topics   Alcohol use: No    Comment: 10/02/2016 sober since 03/14/2005   Drug use: Not Currently    Types: Marijuana    Comment: 10/02/2016  occasional; nothing since before 2006     Allergies   Doxycycline, Aspirin , Ciprofloxacin , Nsaids, Amoxicillin -pot clavulanate, Gluten meal, Lactose, Codeine, Hydrocodone -acetaminophen , Oxycodone , Sulfa antibiotics, and Sulfasalazine   Review of Systems Review of Systems  Constitutional:  Negative for  chills and fever.  Gastrointestinal:  Negative for abdominal pain.  Genitourinary:  Positive for dysuria and frequency. Negative for decreased urine volume, difficulty urinating, flank pain, vaginal bleeding, vaginal discharge and vaginal pain.  Neurological:  Negative for dizziness, light-headedness and headaches.     Physical Exam Triage Vital Signs ED Triage Vitals  Encounter Vitals Group     BP 12/22/23 1224 117/66     Systolic BP Percentile --      Diastolic BP Percentile --      Pulse Rate 12/22/23 1224 80     Resp 12/22/23 1224 18     Temp 12/22/23 1224 98.1 F (36.7 C)     Temp Source 12/22/23 1224 Oral     SpO2 12/22/23 1224 96 %     Weight --      Height --      Head Circumference --      Peak Flow --      Pain Score 12/22/23 1227 0     Pain Loc --      Pain Education --      Exclude from Growth Chart --    No data found.  Updated Vital Signs BP 117/66 (BP Location: Left Arm)   Pulse 80   Temp 98.1 F (36.7 C) (Oral)   Resp 18   SpO2 96%   Visual Acuity Right Eye Distance:   Left Eye Distance:   Bilateral Distance:    Right Eye Near:   Left Eye Near:    Bilateral Near:     Physical Exam Vitals reviewed.  Constitutional:      General: She is awake.     Appearance: Normal appearance. She is well-developed and well-groomed.  HENT:     Head: Normocephalic and atraumatic.  Eyes:     General: Lids are normal. Gaze aligned appropriately.     Extraocular Movements: Extraocular movements intact.     Conjunctiva/sclera: Conjunctivae normal.  Pulmonary:     Effort: Pulmonary effort is normal.  Neurological:     General: No focal deficit present.     Mental Status: She is alert and oriented to person, place, and time.     GCS: GCS eye subscore is 4. GCS verbal subscore is 5. GCS motor subscore is 6.     Cranial Nerves: No cranial nerve deficit, dysarthria or facial asymmetry.  Psychiatric:        Attention and Perception: Attention and perception  normal.        Mood and Affect: Mood and affect normal.        Speech: Speech normal.        Behavior: Behavior normal. Behavior is cooperative.      UC Treatments / Results  Labs (all labs ordered are listed, but only abnormal results are displayed) Labs Reviewed  POCT URINALYSIS DIP (MANUAL ENTRY) - Abnormal; Notable for the following components:      Result Value   Clarity, UA cloudy (*)    Blood, UA small (*)    Leukocytes, UA Large (3+) (*)    All other components within normal limits  URINE CULTURE    EKG   Radiology No results found.  Procedures Procedures (including critical care time)  Medications Ordered in UC Medications - No data to display  Initial Impression / Assessment and Plan / UC Course  I have reviewed the triage vital signs and the nursing notes.  Pertinent labs & imaging results that were available  during my care of the patient were reviewed by me and considered in my medical decision making (see chart for details).      Final Clinical Impressions(s) / UC Diagnoses   Final diagnoses:  Urinary tract infection with hematuria, site unspecified   Acute, recurrent concern Patient reports that she thinks she has UTI.  Urine dip was suspicious for UTI at this time with positive leukocytes and blood.  Will send off for culture for ID and susceptibility testing.  Patient allergy list reviewed with her.  She does request oral ciprofloxacin  stating that she does not have any issues tolerating this and would prefer it over Macrobid.  Will send in Cipro  to 50 mg p.o. twice daily x 5 days.  Patient is amenable to this regimen.  Recommend increase hydration and avoid holding urine for prolonged periods of time.  ED and return precautions reviewed and provided in after visit summary.  Follow-up as needed   Discharge Instructions      Your urine dip results were suspicious for UTI.  We have sent off a urine culture for more definitive evaluation.  I have sent  in a medication called ciprofloxacin  for you to take twice per day for 5 days.  Please finish the entire course of medication unless you are instructed by a provider to stop or develop an allergic reaction.  Please make sure you are staying well-hydrated and avoid holding urine for prolonged periods of time.  If you start to develop fevers, more significant pain, abdominal pain, difficulty urinating, flank pain please go to the emergency room for further evaluation and potential management.     ED Prescriptions     Medication Sig Dispense Auth. Provider   ciprofloxacin  (CIPRO ) 250 MG tablet Take 1 tablet (250 mg total) by mouth 2 (two) times daily for 5 days. 10 tablet Xaine Sansom E, PA-C      PDMP not reviewed this encounter.   Marylene Rocky BRAVO, PA-C 12/22/23 1346

## 2023-12-22 NOTE — ED Triage Notes (Signed)
 Patient presents with c/o dysuria, frequency and cloudy urine x 3 days.Denies any other symptoms at this time.

## 2023-12-23 ENCOUNTER — Other Ambulatory Visit: Payer: Self-pay

## 2023-12-23 DIAGNOSIS — R918 Other nonspecific abnormal finding of lung field: Secondary | ICD-10-CM

## 2023-12-23 NOTE — Progress Notes (Deleted)
 Palliative Medicine [facility]  Telephone:([phone]) [phone] [phone]) [phone]   Name: Mary Erickson Date: 12/23/2023 MRN: [id number]  DOB: 1966/09/19  Patient Care Team: Modesta Messing as PCP - General (Physician Assistant)    REASON FOR CONSULTATION: Mary Erickson is a 58 y.o. female with oncologic medical history including a recent admission for a lobe mass, pathology pending but concerning for malignancy and a history of malignant melanoma of left upper arm (09/2016), type 1 diabetes, hashimoto's, and sjogren syndrome. Palliative ask to see for symptom management and goals of care.    SOCIAL HISTORY:     reports that she quit smoking about 17 years ago. Her smoking use included cigarettes. She started smoking about 41 years ago. She has a 36 pack-year smoking history. She has never used smokeless tobacco. She reports that she does not currently use drugs after having used the following drugs: Marijuana. She reports that she does not drink alcohol.  ADVANCE DIRECTIVES:  Advanced directives on file naming Mary Erickson as the primary decision maker should the patient become unable to speak for themself. There is also a DNR and MOST form on file.   CODE STATUS: DNR  PAST MEDICAL HISTORY: Past Medical History:  Diagnosis Date   Acid reflux    ADHD (attention deficit hyperactivity disorder)    Anemia    Arthritis    "elbows, hands, neck, shoulders" (10/02/2016)   Calcium blood increased    Chronic neck pain    Chronic UTI (urinary tract infection)    "from self caths" (10/02/2016)   Hashimoto's disease    Hepatitis B    acute hepatitis B from lancet   History of alcoholism (HCC)    10/02/2016 "sober since 03/14/2005"   History of blood transfusion 1984; 1989; 2008   "quite a few"   History of shortness of breath    Hypercholesteremia    Hypothyroidism    no meds   Internal carotid aneurysm dx'd early 2017   Legally blind in left eye,  as defined in [place]    Melanoma [facility])    left upper arm   Migraines    "quite a few in a month; at least 15" (10/02/2016)   Neuropathy    neck; "not sure if it's from DM or Sjogren's" (10/02/2016)   Pneumonia 1986   "1 yr after major OR when I was under anesthesia for 13 hr"   Seizures (HCC) ~ 2004; ~ 2013   "seizures from diabetes "   Self-catheterizes urinary bladder    "since OR in 1984" (10/02/2016)   Sjogren's syndrome (HCC)    Thyroid goiter    "still present" (10/02/2016)   Type II diabetes mellitus (HCC)    Urinary retention    self caths   Wears glasses     PAST SURGICAL HISTORY:  Past Surgical History:  Procedure Laterality Date   ABDOMINAL HYSTERECTOMY  2008   BILATERAL CARPAL TUNNEL RELEASE Bilateral    BRONCHIAL WASHINGS  12/10/2023   Procedure: BRONCHIAL WASHINGS;  Surgeon: Martina Sinner, MD;  Location: [facility];  Service: Pulmonary;;   ECTOPIC PREGNANCY SURGERY  1989   ENDOBRONCHIAL ULTRASOUND N/A 12/10/2023   Procedure: ENDOBRONCHIAL ULTRASOUND;  Surgeon: Martina Sinner, MD;  Location: [facility];  Service: Pulmonary;  Laterality: N/A;   EXCISION MELANOMA WITH SENTINEL LYMPH NODE BIOPSY Left 10/02/2016   Procedure: WIDE LOCAL EXCISION AND ADVANCEMENT FLAP CLOSURE LEFT UPPER  MELANOMA WITH SENTINEL LYMPH NODE MAPPING  AND BIOPSY;  Surgeon: Almond Lint, MD;  Location: [facility];  Service: General;  Laterality: Left;   EYE SURGERY     FINE NEEDLE ASPIRATION  12/10/2023   Procedure: FINE NEEDLE ASPIRATION (FNA) LINEAR;  Surgeon: Martina Sinner, MD;  Location: [facility];  Service: Pulmonary;;   LAPAROSCOPIC CHOLECYSTECTOMY  ~ 01/2016   MELANOMA EXCISION WITH SENTINEL LYMPH NODE BIOPSY Left 10/02/2016   WIDE LOCAL EXCISION AND ADVANCEMENT FLAP CLOSURE LEFT UPPER  MELANOMA WITH SENTINEL LYMPH NODE MAPPING AND BIOPSY   OVARIAN CYST REMOVAL     SHOULDER ARTHROSCOPY W/ ROTATOR CUFF REPAIR Right 2014   "partial"   STRABISMUS SURGERY Left 1971   STRABISMUS  SURGERY Left 03/18/2020   Procedure: STRABISMUS REPAIR;  Surgeon: Verne Carrow, MD;  Location: [facility];  Service: Ophthalmology;  Laterality: Left;   THYROIDECTOMY N/A 03/01/2023   Procedure: TOTAL THYROIDECTOMY;  Surgeon: Darnell Level, MD;  Location: [facility];  Service: General;  Laterality: N/A;   TUMOR EXCISION  1984   from tail bone and part of tail bone removed   VIDEO BRONCHOSCOPY N/A 12/10/2023   Procedure: VIDEO BRONCHOSCOPY WITHOUT FLUORO;  Surgeon: Martina Sinner, MD;  Location: [facility];  Service: Pulmonary;  Laterality: N/A;    HEMATOLOGY/ONCOLOGY HISTORY:  Oncology History   No history exists.    ALLERGIES:  is allergic to doxycycline, aspirin, ciprofloxacin, nsaids, amoxicillin-pot clavulanate, gluten meal, lactose, codeine, hydrocodone-acetaminophen, oxycodone, sulfa antibiotics, and sulfasalazine.  MEDICATIONS:  Current Outpatient Medications  Medication Sig Dispense Refill   acetaminophen (TYLENOL) 500 MG tablet Take 1,000 mg by mouth every 6 (six) hours as needed for moderate pain.     aluminum-magnesium hydroxide 200-200 MG/5ML suspension Take by mouth every 6 (six) hours as needed for indigestion.     benzocaine (ORAJEL) 10 % mucosal gel Use as directed 1 Application in the mouth or throat as needed for mouth pain.     Cholecalciferol (VITAMIN D3) 125 MCG (5000 UT) capsule Take 5,000 Units by mouth daily.     ciprofloxacin (CIPRO) 250 MG tablet Take 1 tablet (250 mg total) by mouth 2 (two) times daily for 5 days. 10 tablet 0   Continuous Glucose Sensor (DEXCOM G7 SENSOR) MISC Inject 1 applicator into the skin See admin instructions. Every 10 days     Cyanocobalamin (B-12) 3000 MCG CAPS Take 3,000 mcg by mouth daily.     diclofenac Sodium (VOLTAREN) 1 % GEL Apply 2 g topically 4 (four) times daily. (Patient taking differently: Apply 2 g topically 4 (four) times daily as needed (pain).) 50 g 0   diphenhydrAMINE HCl (BENADRYL ALLERGY PO) Take 1  tablet by mouth at bedtime.     fluconazole (DIFLUCAN) 150 MG tablet Take 150 mg by mouth See admin instructions. Take 150 mg every 3 days for 3 doses starting on the 15th of every month     glucose blood (ACCU-CHEK GUIDE) test strip 1 each by Other route as needed.     ibuprofen (ADVIL) 400 MG tablet Take 400 mg by mouth every 6 (six) hours as needed for moderate pain (pain score 4-6).     insulin detemir (LEVEMIR FLEXTOUCH) 100 UNIT/ML FlexPen Inject 4 Units into the skin 2 (two) times daily.     insulin detemir (LEVEMIR) 100 UNIT/ML FlexPen Inject 16 Units into the skin daily for 6 days.     Insulin lispro (HUMALOG JUNIOR KWIKPEN) 100 UNIT/ML Inject 1-7 Units into the skin as needed (high blood sugar).  Ketotifen Fumarate ([employer] ALLERGY EYE DROPS OP) Place 1 drop into both eyes 2 (two) times daily.     levothyroxine (SYNTHROID) 112 MCG tablet Take 112 mcg by mouth daily before breakfast.     linaclotide (LINZESS) 145 MCG CAPS capsule Take 1 capsule (145 mcg total) by mouth daily before breakfast. 30 capsule 3   Magnesium 400 MG CAPS Take 400 mg by mouth daily.     Melatonin 10-10 MG TBCR Take 10 mg by mouth at bedtime.     methylphenidate 36 MG PO CR tablet Take 36 mg by mouth every morning.     Multiple Vitamins-Minerals (MULTIVITAMINS THER. W/MINERALS) TABS Take 1 tablet by mouth daily.     nystatin cream (MYCOSTATIN) Apply 1 Application topically 2 (two) times daily.     omeprazole (PRILOSEC) 20 MG capsule Take 1 capsule (20 mg total) by mouth 2 (two) times daily before a meal. 60 capsule 1   ondansetron (ZOFRAN ODT) 4 MG disintegrating tablet Take 1 tablet (4 mg total) by mouth every 6 (six) hours as needed for nausea or vomiting. 30 tablet 1   Polyethyl Glycol-Propyl Glycol (SYSTANE OP) Place 1 drop into both eyes 3 (three) times daily as needed (dry eyes).     polyethylene glycol powder (GLYCOLAX/MIRALAX) 17 GM/SCOOP powder Take 17 g by mouth daily as needed for moderate constipation.      predniSONE (DELTASONE) 10 MG tablet Takes 6 tablets for 2 days, then 5 tablets for 2 days, then 4 tablets for 2 days, then 3 tablets for 2 days, then 2 tabs for 2 days, then 1 tab for 2 days, and then stop. 42 tablet 0   [START ON 12/25/2023] predniSONE (DELTASONE) 5 MG tablet Take 1 tablet (5 mg total) by mouth 2 (two) times daily.     No current facility-administered medications for this visit.    VITAL SIGNS: There were no vitals taken for this visit. There were no vitals filed for this visit.  Estimated body mass index is 20.68 kg/m as calculated from the following:   Height as of 12/10/23: 5\' 8"  (1.727 m).   Weight as of 12/10/23: 136 lb 0.4 oz (61.7 kg).  LABS: CBC:    Component Value Date/Time   WBC 12.8 (H) 12/12/2023 0238   HGB 10.9 (L) 12/12/2023 0238   HCT 33.3 (L) 12/12/2023 0238   PLT 438 (H) 12/12/2023 0238   MCV 90.0 12/12/2023 0238   NEUTROABS 9.7 (H) 12/08/2023 1147   LYMPHSABS 1.6 12/08/2023 1147   MONOABS 0.5 12/08/2023 1147   EOSABS 0.1 12/08/2023 1147   BASOSABS 0.1 12/08/2023 1147   Comprehensive Metabolic Panel:    Component Value Date/Time   NA 130 (L) 12/10/2023 0049   K 3.9 12/10/2023 0049   CL 99 12/10/2023 0049   CO2 21 (L) 12/10/2023 0049   BUN 13 12/10/2023 0049   CREATININE 0.84 12/10/2023 0049   GLUCOSE 82 12/10/2023 0049   CALCIUM 8.8 (L) 12/10/2023 0049   AST 25 12/10/2023 0049   ALT 15 12/10/2023 0049   ALKPHOS 105 12/10/2023 0049   BILITOT 0.7 12/10/2023 0049   PROT 6.8 12/10/2023 0049   ALBUMIN 3.0 (L) 12/10/2023 0049    RADIOGRAPHIC STUDIES: CT Angio Chest PE W and/or Wo Contrast Result Date: 12/08/2023 CLINICAL DATA:  Hemoptysis with right upper chest opacity on same day chest radiograph EXAM: CT ANGIOGRAPHY CHEST WITH CONTRAST TECHNIQUE: Multidetector CT imaging of the chest was performed using the standard protocol during bolus administration of  intravenous contrast. Multiplanar CT image reconstructions and MIPs were obtained  to evaluate the vascular anatomy. RADIATION DOSE REDUCTION: This exam was performed according to the departmental dose-optimization program which includes automated exposure control, adjustment of the mA and/or kV according to patient size and/or use of iterative reconstruction technique. CONTRAST:  75mL OMNIPAQUE IOHEXOL 350 MG/ML SOLN COMPARISON:  Same day chest radiograph FINDINGS: Cardiovascular: The study is high quality for the evaluation of pulmonary embolism. There is abrupt cut off of right upper lobe apical subsegmental pulmonary artery (10:132). There are otherwise no filling defects in the central, lobar, segmental or subsegmental pulmonary artery branches to suggest acute pulmonary embolism. Great vessels are normal in course and caliber. Normal heart size. No significant pericardial fluid/thickening. Mediastinum/Nodes: Thyroidectomy. Normal esophagus. 18 mm right hilar lymphadenopathy (4:66). Left axillary surgical clips. Lungs/Pleura: The central airways are patent. Moderate effacement of right upper lobe bronchus and bronchials with suspected complete obstruction of at least 1 segmental airway (12:57) secondary to consolidative mass centered within the posterior right upper lobe measuring 9.9 x 9.9 cm (12:57). This mass crosses the major fissure into the superior segment right lower lobe and extends towards the hilum. There is peripheral ground-glass opacity surrounding the mass with irregular nodule in the right apex measuring 1.1 x 0.5 cm (12:22). Mild upper lobe predominant centrilobular emphysema. Subsegmental right middle lobe and lingular atelectasis. No pneumothorax. No pleural effusion. Upper abdomen: Subtle hypodensity within hepatic segment 6 measures 4.0 x 3.2 cm (4:151). Musculoskeletal: No acute or abnormal lytic or blastic osseous lesions. Review of the MIP images confirms the above findings. IMPRESSION: 1. Large consolidative mass centered within the posterior right upper lobe  measuring 9.9 x 9.9 cm, crossing the major fissure into the superior segment right lower lobe and extending towards the hilum, highly suspicious for primary lung malignancy. 2. Abrupt cut off of right upper lobe apical subsegmental pulmonary artery, which may be secondary to mass effect or pulmonary embolism. No other filling defects in the central, lobar, segmental or subsegmental pulmonary artery branches to suggest acute pulmonary embolism. 3. Effacement and obstruction of at least 1 right upper lobe bronchiole by the mass. 4. Peripheral ground-glass opacity surrounding the mass with irregular nodule in the right apex measuring 1.1 x 0.5 cm, which may be infectious/inflammatory or reflect satellite nodule. 5. Right hilar lymphadenopathy, suspicious for metastatic disease. 6. Subtle hypodensity within hepatic segment 6 measures 4.0 x 3.2 cm, incompletely characterized. Recommend further evaluation with nonemergent contrast-enhanced MRI abdomen. 7.  Emphysema (ICD10-J43.9). Electronically Signed   By: Agustin Cree M.D.   On: 12/08/2023 14:42   PERFORMANCE STATUS (ECOG) : {CHL ONC ECOG ZO:[id number]}  Review of Systems Unless otherwise noted, a complete review of systems is negative.  Physical Exam General: NAD Cardiovascular: regular rate and rhythm Pulmonary: clear ant fields Abdomen: soft, nontender, + bowel sounds Extremities: no edema, no joint deformities Skin: no rashes Neurological: Alert and oriented x3  IMPRESSION: *** I introduced myself, Becca Bayne RN, and Palliative's role in collaboration with the oncology team. Concept of Palliative Care was introduced as specialized medical care for people and their families living with serious illness.  It focuses on providing relief from the symptoms and stress of a serious illness.  The goal is to improve quality of life for both the patient and the family. Values and goals of care important to patient and family were attempted to be elicited.    We  discussed *** current illness and what it means in the  larger context of *** on-going co-morbidities. Natural disease trajectory and expectations were discussed.  I discussed the importance of continued conversation with family and their medical providers regarding overall plan of care and treatment options, ensuring decisions are within the context of the patients values and GOCs.  PLAN: Established therapeutic relationship. Education provided on palliative's role in collaboration with their Oncology/Radiation team. I will plan to see patient back in 2-4 weeks in collaboration to other oncology appointments.    Patient expressed understanding and was in agreement with this plan. She also understands that She can call the clinic at any time with any questions, concerns, or complaints.   Thank you for your referral and allowing Palliative to assist in Mary Erickson's care.   Number and complexity of problems addressed: ***HIGH - 1 or more chronic illnesses with SEVERE exacerbation, progression, or side effects of treatment - advanced cancer, pain. Any controlled substances utilized were prescribed in the context of palliative care.   Visit consisted of counseling and education dealing with the complex and emotionally intense issues of symptom management and palliative care in the setting of serious and potentially life-threatening illness.  Signed by: Willette Alma, AGPCNP-BC Palliative Medicine Team/[facility]

## 2023-12-23 NOTE — Telephone Encounter (Signed)
 Patient called and left message wanting to cancel appt until further discussion with Dr. Appointment canceled.

## 2023-12-23 NOTE — Progress Notes (Signed)
 Pt left VM requesting her appt with Palliative care be cancelled or rescheduled until after her appt with Dr Marguerita Shih. Pt stated that whomever she had spoken to thus far have been unable to help her. I messaged Wallie Gums, scheduler for Palliative care and requested she give the pt a call to address her concerns. Tameka acknowledged message and states she will reach out to her.

## 2023-12-24 LAB — URINE CULTURE: Culture: >=100000 — AB

## 2023-12-25 ENCOUNTER — Inpatient Hospital Stay: Payer: Medicaid Other

## 2023-12-25 ENCOUNTER — Inpatient Hospital Stay: Payer: Medicaid Other | Attending: Internal Medicine | Admitting: Internal Medicine

## 2023-12-25 ENCOUNTER — Other Ambulatory Visit: Payer: Self-pay

## 2023-12-25 VITALS — BP 139/77 | HR 72 | Temp 98.2°F | Resp 18 | Ht 68.0 in | Wt 133.7 lb

## 2023-12-25 DIAGNOSIS — C3490 Malignant neoplasm of unspecified part of unspecified bronchus or lung: Secondary | ICD-10-CM

## 2023-12-25 DIAGNOSIS — E063 Autoimmune thyroiditis: Secondary | ICD-10-CM

## 2023-12-25 DIAGNOSIS — E119 Type 2 diabetes mellitus without complications: Secondary | ICD-10-CM

## 2023-12-25 DIAGNOSIS — R918 Other nonspecific abnormal finding of lung field: Secondary | ICD-10-CM

## 2023-12-25 DIAGNOSIS — C3411 Malignant neoplasm of upper lobe, right bronchus or lung: Secondary | ICD-10-CM | POA: Insufficient documentation

## 2023-12-25 DIAGNOSIS — R634 Abnormal weight loss: Secondary | ICD-10-CM | POA: Diagnosis not present

## 2023-12-25 DIAGNOSIS — M069 Rheumatoid arthritis, unspecified: Secondary | ICD-10-CM

## 2023-12-25 DIAGNOSIS — M3500 Sicca syndrome, unspecified: Secondary | ICD-10-CM

## 2023-12-25 DIAGNOSIS — Z801 Family history of malignant neoplasm of trachea, bronchus and lung: Secondary | ICD-10-CM

## 2023-12-25 DIAGNOSIS — Z87891 Personal history of nicotine dependence: Secondary | ICD-10-CM

## 2023-12-25 LAB — CBC WITH DIFFERENTIAL (CANCER CENTER ONLY)
Abs Immature Granulocytes: 0.15 10*3/uL — ABNORMAL HIGH (ref 0.00–0.07)
Basophils Absolute: 0.1 10*3/uL (ref 0.0–0.1)
Basophils Relative: 1 %
Eosinophils Absolute: 0.1 10*3/uL (ref 0.0–0.5)
Eosinophils Relative: 0 %
HCT: 36.1 % (ref 36.0–46.0)
Hemoglobin: 11.8 g/dL — ABNORMAL LOW (ref 12.0–15.0)
Immature Granulocytes: 1 %
Lymphocytes Relative: 10 %
Lymphs Abs: 1.6 10*3/uL (ref 0.7–4.0)
MCH: 29 pg (ref 26.0–34.0)
MCHC: 32.7 g/dL (ref 30.0–36.0)
MCV: 88.7 fL (ref 80.0–100.0)
Monocytes Absolute: 0.8 10*3/uL (ref 0.1–1.0)
Monocytes Relative: 5 %
Neutro Abs: 13 10*3/uL — ABNORMAL HIGH (ref 1.7–7.7)
Neutrophils Relative %: 83 %
Platelet Count: 430 10*3/uL — ABNORMAL HIGH (ref 150–400)
RBC: 4.07 MIL/uL (ref 3.87–5.11)
RDW: 14.1 % (ref 11.5–15.5)
WBC Count: 15.7 10*3/uL — ABNORMAL HIGH (ref 4.0–10.5)
nRBC: 0 % (ref 0.0–0.2)

## 2023-12-25 LAB — CMP (CANCER CENTER ONLY)
ALT: 15 U/L (ref 0–44)
AST: 25 U/L (ref 15–41)
Albumin: 3.7 g/dL (ref 3.5–5.0)
Alkaline Phosphatase: 140 U/L — ABNORMAL HIGH (ref 38–126)
Anion gap: 6 (ref 5–15)
BUN: 13 mg/dL (ref 6–20)
CO2: 31 mmol/L (ref 22–32)
Calcium: 9.2 mg/dL (ref 8.9–10.3)
Chloride: 95 mmol/L — ABNORMAL LOW (ref 98–111)
Creatinine: 0.8 mg/dL (ref 0.44–1.00)
GFR, Estimated: >60 mL/min (ref >60)
Glucose, Bld: 204 mg/dL — ABNORMAL HIGH (ref 70–99)
Potassium: 4.5 mmol/L (ref 3.5–5.1)
Sodium: 132 mmol/L — ABNORMAL LOW (ref 135–145)
Total Bilirubin: 0.4 mg/dL (ref 0.0–1.2)
Total Protein: 7.5 g/dL (ref 6.5–8.1)

## 2023-12-25 LAB — MISCELLANEOUS TEST

## 2023-12-25 NOTE — Progress Notes (Signed)
[facility] Telephone:([phone]) [phone]   Fax:(336) [phone]  CONSULT NOTE  REFERRING PHYSICIAN: Dr. Melody Comas  REASON FOR CONSULTATION:  58 years old white female recently diagnosed with lung cancer  HPI Mary Erickson is a 59 y.o. female.Discussed the use of AI scribe software for clinical note transcription with the patient, who gave verbal consent to proceed.  History of Present Illness   Mary Erickson is a 58 year old female with non-small cell lung cancer who presents for initial consultation. She is accompanied by Leta Jungling, her neighbor.  She was diagnosed with non-small cell lung cancer, adenocarcinoma, following a bronchoscopy on December 10, 2023. Initially, she had a dry, non-productive cough that began after thyroid surgery in April 2024, which persisted and was accompanied by significant fatigue. Approximately two weeks prior to the consultation, she experienced hemoptysis, prompting medical attention. A chest x-ray and CT angiogram revealed a large consolidative mass measuring 9.9 cm in the posterior right upper lobe with right hilar lymph node involvement. There is also a suspicious area in the liver, though not well-identified on the scan.  She experiences a burning pain in her upper back, which she associates with the lung tumor's location. This pain worsened as she tapered off high-dose steroids, which she was taking to manage rheumatoid arthritis symptoms and swelling in her knees and ankles. The steroids also temporarily alleviated her cough and fatigue.  She reports significant weight loss, being approximately 20 pounds underweight, and ongoing gastrointestinal issues, which she attributes to long-term NSAID use for pain management. She experiences nausea, for which she takes Zofran as needed.  Her medical history includes multiple surgeries, such as a thyroidectomy for Hashimoto's disease and a benign tumor removal around her tailbone at age  13. She has type 1 diabetes diagnosed at age 80, Sjogren's syndrome, and non-alcoholic fatty liver disease. She also has a history of melanoma on her left arm, which was removed at an early stage.  In her family history, her father had primary progressive multiple sclerosis, and her mother had non-small cell lung cancer. Her brother is also a type 1 diabetic. She has a history of smoking for 22 years, quitting 15 years ago, and a history of alcohol abuse, having quit drinking 18 years ago.       HPI  Past Medical History:  Diagnosis Date   Acid reflux    ADHD (attention deficit hyperactivity disorder)    Anemia    Arthritis    "elbows, hands, neck, shoulders" (10/02/2016)   Calcium blood increased    Chronic neck pain    Chronic UTI (urinary tract infection)    "from self caths" (10/02/2016)   Hashimoto's disease    Hepatitis B    acute hepatitis B from lancet   History of alcoholism (HCC)    10/02/2016 "sober since 03/14/2005"   History of blood transfusion 1984; 1989; 2008   "quite a few"   History of shortness of breath    Hypercholesteremia    Hypothyroidism    no meds   Internal carotid aneurysm dx'd early 2017   Legally blind in left eye, as defined in [place]    Melanoma [facility])    left upper arm   Migraines    "quite a few in a month; at least 15" (10/02/2016)   Neuropathy    neck; "not sure if it's from DM or Sjogren's" (10/02/2016)   Pneumonia 1986   "1 yr after major OR when I was  under anesthesia for 13 hr"   Seizures (HCC) ~ 2004; ~ 2013   "seizures from diabetes "   Self-catheterizes urinary bladder    "since OR in 1984" (10/02/2016)   Sjogren's syndrome (HCC)    Thyroid goiter    "still present" (10/02/2016)   Type II diabetes mellitus (HCC)    Urinary retention    self caths   Wears glasses     Past Surgical History:  Procedure Laterality Date   ABDOMINAL HYSTERECTOMY  2008   BILATERAL CARPAL TUNNEL RELEASE Bilateral    BRONCHIAL WASHINGS  12/10/2023    Procedure: BRONCHIAL WASHINGS;  Surgeon: Martina Sinner, MD;  Location: [facility];  Service: Pulmonary;;   ECTOPIC PREGNANCY SURGERY  1989   ENDOBRONCHIAL ULTRASOUND N/A 12/10/2023   Procedure: ENDOBRONCHIAL ULTRASOUND;  Surgeon: Martina Sinner, MD;  Location: [facility];  Service: Pulmonary;  Laterality: N/A;   EXCISION MELANOMA WITH SENTINEL LYMPH NODE BIOPSY Left 10/02/2016   Procedure: WIDE LOCAL EXCISION AND ADVANCEMENT FLAP CLOSURE LEFT UPPER  MELANOMA WITH SENTINEL LYMPH NODE MAPPING AND BIOPSY;  Surgeon: Almond Lint, MD;  Location: [facility];  Service: General;  Laterality: Left;   EYE SURGERY     FINE NEEDLE ASPIRATION  12/10/2023   Procedure: FINE NEEDLE ASPIRATION (FNA) LINEAR;  Surgeon: Martina Sinner, MD;  Location: [facility];  Service: Pulmonary;;   LAPAROSCOPIC CHOLECYSTECTOMY  ~ 01/2016   MELANOMA EXCISION WITH SENTINEL LYMPH NODE BIOPSY Left 10/02/2016   WIDE LOCAL EXCISION AND ADVANCEMENT FLAP CLOSURE LEFT UPPER  MELANOMA WITH SENTINEL LYMPH NODE MAPPING AND BIOPSY   OVARIAN CYST REMOVAL     SHOULDER ARTHROSCOPY W/ ROTATOR CUFF REPAIR Right 2014   "partial"   STRABISMUS SURGERY Left 1971   STRABISMUS SURGERY Left 03/18/2020   Procedure: STRABISMUS REPAIR;  Surgeon: Verne Carrow, MD;  Location: [facility];  Service: Ophthalmology;  Laterality: Left;   THYROIDECTOMY N/A 03/01/2023   Procedure: TOTAL THYROIDECTOMY;  Surgeon: Darnell Level, MD;  Location: [facility];  Service: General;  Laterality: N/A;   TUMOR EXCISION  1984   from tail bone and part of tail bone removed   VIDEO BRONCHOSCOPY N/A 12/10/2023   Procedure: VIDEO BRONCHOSCOPY WITHOUT FLUORO;  Surgeon: Martina Sinner, MD;  Location: [facility];  Service: Pulmonary;  Laterality: N/A;    Family History  Problem Relation Age of Onset   Multiple sclerosis Father    Colon cancer Neg Hx    Stomach cancer Neg Hx    Colon polyps Neg Hx    Esophageal cancer Neg Hx    Rectal cancer Neg Hx      Social History Social History   Tobacco Use   Smoking status: Former    Current packs/day: 0.00    Average packs/day: 1.5 packs/day for 24.0 years (36.0 ttl pk-yrs)    Types: Cigarettes    Start date: 51    Quit date: 2008    Years since quitting: 17.1   Smokeless tobacco: Never  Vaping Use   Vaping status: Never Used  Substance Use Topics   Alcohol use: No    Comment: 10/02/2016 "sober since 03/14/2005"   Drug use: Not Currently    Types: Marijuana    Comment: 10/02/2016 " occasional; nothing since before 2006"    Allergies  Allergen Reactions   Doxycycline Other (See Comments)    Elevates liver enzymes    Aspirin Other (See Comments)    Can take 81 mg but has to be coated;  No other Aspirin due to bleeding in stools from ulcers   Ciprofloxacin Hives    Per pt, she can tolerate oral but not IV   Nsaids Other (See Comments)    GI issues    Amoxicillin-Pot Clavulanate Nausea Only   Gluten Meal     Avoid due to autoimmune issues    Lactose     Avoid due to autoimmune issues    Codeine Nausea And Vomiting   Hydrocodone-Acetaminophen Nausea And Vomiting   Oxycodone Nausea And Vomiting   Sulfa Antibiotics Nausea And Vomiting   Sulfasalazine Nausea And Vomiting    Current Outpatient Medications  Medication Sig Dispense Refill   acetaminophen (TYLENOL) 500 MG tablet Take 1,000 mg by mouth every 6 (six) hours as needed for moderate pain.     aluminum-magnesium hydroxide 200-200 MG/5ML suspension Take by mouth every 6 (six) hours as needed for indigestion.     benzocaine (ORAJEL) 10 % mucosal gel Use as directed 1 Application in the mouth or throat as needed for mouth pain.     Cholecalciferol (VITAMIN D3) 125 MCG (5000 UT) capsule Take 5,000 Units by mouth daily.     ciprofloxacin (CIPRO) 250 MG tablet Take 1 tablet (250 mg total) by mouth 2 (two) times daily for 5 days. 10 tablet 0   Continuous Glucose Sensor (DEXCOM G7 SENSOR) MISC Inject 1 applicator into the skin  See admin instructions. Every 10 days     Cyanocobalamin (B-12) 3000 MCG CAPS Take 3,000 mcg by mouth daily.     diclofenac Sodium (VOLTAREN) 1 % GEL Apply 2 g topically 4 (four) times daily. (Patient taking differently: Apply 2 g topically 4 (four) times daily as needed (pain).) 50 g 0   diphenhydrAMINE HCl (BENADRYL ALLERGY PO) Take 1 tablet by mouth at bedtime.     fluconazole (DIFLUCAN) 150 MG tablet Take 150 mg by mouth See admin instructions. Take 150 mg every 3 days for 3 doses starting on the 15th of every month     glucose blood (ACCU-CHEK GUIDE) test strip 1 each by Other route as needed.     ibuprofen (ADVIL) 400 MG tablet Take 400 mg by mouth every 6 (six) hours as needed for moderate pain (pain score 4-6).     insulin detemir (LEVEMIR FLEXTOUCH) 100 UNIT/ML FlexPen Inject 4 Units into the skin 2 (two) times daily.     insulin detemir (LEVEMIR) 100 UNIT/ML FlexPen Inject 16 Units into the skin daily for 6 days.     Insulin lispro (HUMALOG JUNIOR KWIKPEN) 100 UNIT/ML Inject 1-7 Units into the skin as needed (high blood sugar).     Ketotifen Fumarate ([employer] ALLERGY EYE DROPS OP) Place 1 drop into both eyes 2 (two) times daily.     levothyroxine (SYNTHROID) 112 MCG tablet Take 112 mcg by mouth daily before breakfast.     linaclotide (LINZESS) 145 MCG CAPS capsule Take 1 capsule (145 mcg total) by mouth daily before breakfast. 30 capsule 3   Magnesium 400 MG CAPS Take 400 mg by mouth daily.     Melatonin 10-10 MG TBCR Take 10 mg by mouth at bedtime.     methylphenidate 36 MG PO CR tablet Take 36 mg by mouth every morning.     Multiple Vitamins-Minerals (MULTIVITAMINS THER. W/MINERALS) TABS Take 1 tablet by mouth daily.     nystatin cream (MYCOSTATIN) Apply 1 Application topically 2 (two) times daily.     omeprazole (PRILOSEC) 20 MG capsule Take 1 capsule (20 mg  total) by mouth 2 (two) times daily before a meal. 60 capsule 1   ondansetron (ZOFRAN ODT) 4 MG disintegrating tablet Take 1 tablet  (4 mg total) by mouth every 6 (six) hours as needed for nausea or vomiting. 30 tablet 1   Polyethyl Glycol-Propyl Glycol (SYSTANE OP) Place 1 drop into both eyes 3 (three) times daily as needed (dry eyes).     polyethylene glycol powder (GLYCOLAX/MIRALAX) 17 GM/SCOOP powder Take 17 g by mouth daily as needed for moderate constipation.     predniSONE (DELTASONE) 10 MG tablet Takes 6 tablets for 2 days, then 5 tablets for 2 days, then 4 tablets for 2 days, then 3 tablets for 2 days, then 2 tabs for 2 days, then 1 tab for 2 days, and then stop. 42 tablet 0   predniSONE (DELTASONE) 5 MG tablet Take 1 tablet (5 mg total) by mouth 2 (two) times daily.     No current facility-administered medications for this visit.    Review of Systems  Constitutional: positive for fatigue and weight loss Eyes: negative Ears, nose, mouth, throat, and face: negative Respiratory: positive for cough and pleurisy/chest pain Cardiovascular: negative Gastrointestinal: negative Genitourinary:negative Integument/breast: negative Hematologic/lymphatic: negative Musculoskeletal:negative Neurological: negative Behavioral/Psych: negative Endocrine: negative Allergic/Immunologic: negative  Physical Exam  ZOX:WRUEA, healthy, no distress, well nourished, well developed, and anxious SKIN: skin color, texture, turgor are normal, no rashes or significant lesions HEAD: Normocephalic, No masses, lesions, tenderness or abnormalities EYES: normal, PERRLA, Conjunctiva are pink and non-injected EARS: External ears normal, Canals clear OROPHARYNX:no exudate, no erythema, and lips, buccal mucosa, and tongue normal  NECK: supple, no adenopathy, no JVD LYMPH:  no palpable lymphadenopathy, no hepatosplenomegaly BREAST:not examined LUNGS: clear to auscultation , and palpation HEART: regular rate & rhythm, no murmurs, and no gallops ABDOMEN:abdomen soft, non-tender, normal bowel sounds, and no masses or organomegaly BACK: Back  symmetric, no curvature., No CVA tenderness EXTREMITIES:no joint deformities, effusion, or inflammation, no edema  NEURO: alert & oriented x 3 with fluent speech, no focal motor/sensory deficits  PERFORMANCE STATUS: ECOG 1  LABORATORY DATA: Lab Results  Component Value Date   WBC 15.7 (H) 12/25/2023   HGB 11.8 (L) 12/25/2023   HCT 36.1 12/25/2023   MCV 88.7 12/25/2023   PLT 430 (H) 12/25/2023      Chemistry      Component Value Date/Time   NA 132 (L) 12/25/2023 1344   K 4.5 12/25/2023 1344   CL 95 (L) 12/25/2023 1344   CO2 31 12/25/2023 1344   BUN 13 12/25/2023 1344   CREATININE 0.80 12/25/2023 1344      Component Value Date/Time   CALCIUM 9.2 12/25/2023 1344   ALKPHOS 140 (H) 12/25/2023 1344   AST 25 12/25/2023 1344   ALT 15 12/25/2023 1344   BILITOT 0.4 12/25/2023 1344       RADIOGRAPHIC STUDIES: DG CHEST PORT 1 VIEW Result Date: 12/10/2023 CLINICAL DATA:  Post bronchoscopy with biopsy EXAM: PORTABLE CHEST - 1 VIEW COMPARISON:  12/08/2023 FINDINGS: Stable right upper chest opacity. No pneumothorax. Left lung clear. Heart size and mediastinal contours are within normal limits. No effusion. Surgical clips left axilla. IMPRESSION: 1. No pneumothorax. 2. Stable right upper chest opacity. Electronically Signed   By: Corlis Leak M.D.   On: 12/10/2023 15:10   CT Angio Chest PE W and/or Wo Contrast Result Date: 12/08/2023 CLINICAL DATA:  Hemoptysis with right upper chest opacity on same day chest radiograph EXAM: CT ANGIOGRAPHY CHEST WITH CONTRAST TECHNIQUE: Multidetector  CT imaging of the chest was performed using the standard protocol during bolus administration of intravenous contrast. Multiplanar CT image reconstructions and MIPs were obtained to evaluate the vascular anatomy. RADIATION DOSE REDUCTION: This exam was performed according to the departmental dose-optimization program which includes automated exposure control, adjustment of the mA and/or kV according to patient size  and/or use of iterative reconstruction technique. CONTRAST:  75mL OMNIPAQUE IOHEXOL 350 MG/ML SOLN COMPARISON:  Same day chest radiograph FINDINGS: Cardiovascular: The study is high quality for the evaluation of pulmonary embolism. There is abrupt cut off of right upper lobe apical subsegmental pulmonary artery (10:132). There are otherwise no filling defects in the central, lobar, segmental or subsegmental pulmonary artery branches to suggest acute pulmonary embolism. Great vessels are normal in course and caliber. Normal heart size. No significant pericardial fluid/thickening. Mediastinum/Nodes: Thyroidectomy. Normal esophagus. 18 mm right hilar lymphadenopathy (4:66). Left axillary surgical clips. Lungs/Pleura: The central airways are patent. Moderate effacement of right upper lobe bronchus and bronchials with suspected complete obstruction of at least 1 segmental airway (12:57) secondary to consolidative mass centered within the posterior right upper lobe measuring 9.9 x 9.9 cm (12:57). This mass crosses the major fissure into the superior segment right lower lobe and extends towards the hilum. There is peripheral ground-glass opacity surrounding the mass with irregular nodule in the right apex measuring 1.1 x 0.5 cm (12:22). Mild upper lobe predominant centrilobular emphysema. Subsegmental right middle lobe and lingular atelectasis. No pneumothorax. No pleural effusion. Upper abdomen: Subtle hypodensity within hepatic segment 6 measures 4.0 x 3.2 cm (4:151). Musculoskeletal: No acute or abnormal lytic or blastic osseous lesions. Review of the MIP images confirms the above findings. IMPRESSION: 1. Large consolidative mass centered within the posterior right upper lobe measuring 9.9 x 9.9 cm, crossing the major fissure into the superior segment right lower lobe and extending towards the hilum, highly suspicious for primary lung malignancy. 2. Abrupt cut off of right upper lobe apical subsegmental pulmonary  artery, which may be secondary to mass effect or pulmonary embolism. No other filling defects in the central, lobar, segmental or subsegmental pulmonary artery branches to suggest acute pulmonary embolism. 3. Effacement and obstruction of at least 1 right upper lobe bronchiole by the mass. 4. Peripheral ground-glass opacity surrounding the mass with irregular nodule in the right apex measuring 1.1 x 0.5 cm, which may be infectious/inflammatory or reflect satellite nodule. 5. Right hilar lymphadenopathy, suspicious for metastatic disease. 6. Subtle hypodensity within hepatic segment 6 measures 4.0 x 3.2 cm, incompletely characterized. Recommend further evaluation with nonemergent contrast-enhanced MRI abdomen. 7.  Emphysema (ICD10-J43.9). Electronically Signed   By: Agustin Cree M.D.   On: 12/08/2023 14:42   DG Chest Portable 1 View Result Date: 12/08/2023 CLINICAL DATA:  Hemoptysis. EXAM: PORTABLE CHEST 1 VIEW COMPARISON:  04/16/2021 FINDINGS: Heart size is normal. Large area of poorly defined airspace opacity is seen in the right upper lobe, which could be due to pneumonia or neoplasm. Left lung is clear. No pleural effusion. IMPRESSION: Large area of poorly defined airspace opacity in right upper lobe, which could be due to pneumonia or neoplasm. Clinical correlation is recommended. Consider follow-up chest radiographs or CT. Electronically Signed   By: Danae Orleans M.D.   On: 12/08/2023 10:33    ASSESSMENT AND PLAN:    Non-Small Cell Lung Cancer (NSCLC) - Adenocarcinoma at least stage IIIA (T4, N1, M0) diagnosed in January 2025. Mary Erickson, a 58 year old female, presents with NSCLC, adenocarcinoma subtype, in the posterior  right upper lobe with right hilar lymph node involvement. The mass measures 9.9 cm. Symptoms include dry cough, hemoptysis, fatigue, and significant weight loss. She declined chemotherapy due to comorbidities and potential organ impact but is open to radiation therapy and molecular  testing for targeted therapy. History of smoking (quit 15 years ago) and family history of lung cancer. Discussed risks of tumor progression, including airway obstruction, hemoptysis, and chest wall invasion. Radiation therapy could alleviate symptoms. Surgery is not an option due to tumor size and spread. She prefers to avoid aggressive treatments. - Order PET scan - Order MRI of the brain - Send blood test for molecular study - Refer to radiation oncologist - Schedule follow-up appointment in two weeks  Hashimoto's Thyroiditis (Post-Thyroidectomy) Status post-thyroidectomy in April of the previous year. Persistent dry cough post-surgery, initially attributed to anesthesia effects.  Sjogren's Syndrome Contributing to dryness symptoms, including dry eyes and possibly dry cough.  Rheumatoid Arthritis Managed with steroids. Reports increased pain and swelling in knees and ankles, improved with steroid treatment. Experiencing gastrointestinal side effects from NSAIDs used for pain management.  Type 1 Diabetes Mellitus Diagnosed at age 34. History of non-alcoholic fatty liver disease. Manages diabetes with regular medical care.  General Health Maintenance Multiple autoimmune disorders, including Hashimoto's thyroiditis, rheumatoid arthritis, and Sjogren's syndrome. History of melanoma, chronic urinary tract infections due to self-catheterization, and hepatitis. No history of cardiovascular events or hypertension.  Follow-up - Follow-up appointment in two weeks to review PET scan, MRI, and molecular study results.    The patient voices understanding of current disease status and treatment options and is in agreement with the current care plan.  All questions were answered. The patient knows to call the clinic with any problems, questions or concerns. We can certainly see the patient much sooner if necessary.  Thank you so much for allowing me to participate in the care of Mary Erickson.  I will continue to follow up the patient with you and assist in her care.  The total time spent in the appointment was 90 minutes.  Disclaimer: This note was dictated with voice recognition software. Similar sounding words can inadvertently be transcribed and may not be corrected upon review.   Lajuana Matte December 25, 2023, 2:58 PM

## 2023-12-25 NOTE — Progress Notes (Signed)
I met with pt face to face today at her consult appointment with Dr Arbutus Ped. She was accompanied by her friend and neighbor, Mindi Junker. At this time, the pt is not interested in chemotherapy with all the other health issues she has, however she may be interested in radiation therapy. We will send blood to [facility] to see if the pt has any tumor markers for which she could receive oral targeted therapy. Pt would like to know the extent of her disease, so a brain MRI and PET scan were ordered. I gave the pt my card and encouraged her to call with any questions or concerns.  Order placed for [facility] labs and I escorted the pt to the lab at the conclusion of the appt.

## 2023-12-26 ENCOUNTER — Other Ambulatory Visit: Payer: Self-pay

## 2023-12-26 ENCOUNTER — Telehealth: Payer: Self-pay | Admitting: Internal Medicine

## 2023-12-26 ENCOUNTER — Telehealth: Payer: Self-pay | Admitting: Radiation Oncology

## 2023-12-26 NOTE — Progress Notes (Signed)
Pt left me a VM this morning stating she is not interested in treatment, including radiation oncology, and is more interested in controlling her pain and avoid "prolonging the inevitable" I called her back at 12:39pm today to let her know that I received her VM and understand and respect her decision. I asked if she still wanted to do the imaging and she said "there's not really any point" Pt already has a palliative care referral and I requested scheduler make an appt for her, preferably on a Thursday as that is when she has a ride available. I let the pt know I will be here for her if there is anything I can do to help.

## 2023-12-26 NOTE — Telephone Encounter (Signed)
2/13 @ 2:40 pm Received referral from Dr. Asa Lente office.  Call and spoke to patient.  She decline any services at this time from Radiation Oncology.  She stated this is not what she wants at this time.  Secure chat sent to Alejandro Mulling, RN, so they are aware.

## 2023-12-26 NOTE — Progress Notes (Signed)
Per Dr. Arbutus Ped request this CMA sucessfully emailed [facility] request to be processed. Lab was drawn on 12/25/2023. Received confirmation of receipt. Order placed fax drawer.

## 2023-12-27 NOTE — Progress Notes (Signed)
The proposed treatment discussed in conference is for discussion purpose only and is not a binding recommendation.  The patients have not been physically examined, or presented with their treatment options.  Therefore, final treatment plans cannot be decided.

## 2024-01-07 ENCOUNTER — Encounter: Payer: Self-pay | Admitting: Hematology

## 2024-01-09 LAB — FUNGUS CULTURE WITH STAIN

## 2024-01-09 LAB — FUNGAL ORGANISM REFLEX

## 2024-01-09 LAB — FUNGUS CULTURE RESULT

## 2024-01-15 NOTE — Progress Notes (Deleted)
 Palliative Medicine [facility]  [phone] [phone]   Name: Mary Erickson Date: 01/15/2024 MRN: [id number]  DOB: 24-Nov-1965  Patient Care Team: Modesta Messing as PCP - General (Physician Assistant)    REASON FOR CONSULTATION: Mary Erickson is a 58 y.o. female with oncologic medical history including a recent admission for a lobe mass, pathology pending but concerning for malignancy and a history of malignant melanoma of left upper arm (09/2016), type 1 diabetes, hashimoto's, and sjogren syndrome. Palliative ask to see for symptom management and goals of care.    SOCIAL HISTORY:     reports that she quit smoking about 17 years ago. Her smoking use included cigarettes. She started smoking about 41 years ago. She has a 36 pack-year smoking history. She has never used smokeless tobacco. She reports that she does not currently use drugs after having used the following drugs: Marijuana. She reports that she does not drink alcohol.  ADVANCE DIRECTIVES:  Advanced directives on file naming Mary Erickson as the primary decision maker should the patient become unable to speak for themself. There is also a DNR and MOST form on file.   CODE STATUS: DNR  PAST MEDICAL HISTORY: Past Medical History:  Diagnosis Date  . Acid reflux   . ADHD (attention deficit hyperactivity disorder)   . Anemia   . Arthritis    "elbows, hands, neck, shoulders" (10/02/2016)  . Calcium blood increased   . Chronic neck pain   . Chronic UTI (urinary tract infection)    "from self caths" (10/02/2016)  . Hashimoto's disease   . Hepatitis B    acute hepatitis B from lancet  . History of alcoholism (HCC)    10/02/2016 "sober since 03/14/2005"  . History of blood transfusion 1984; 1989; 2008   "quite a few"  . History of shortness of breath   . Hypercholesteremia   . Hypothyroidism    no meds  . Internal carotid aneurysm dx'd early 2017  . Legally  blind in left eye, as defined in [place]   . Melanoma (HCC)    left upper arm  . Migraines    "quite a few in a month; at least 15" (10/02/2016)  . Neuropathy    neck; "not sure if it's from DM or Sjogren's" (10/02/2016)  . Pneumonia 1986   "1 yr after major OR when I was under anesthesia for 13 hr"  . Seizures (HCC) ~ 2004; ~ 2013   "seizures from diabetes "  . Self-catheterizes urinary bladder    "since OR in 1984" (10/02/2016)  . Sjogren's syndrome (HCC)   . Thyroid goiter    "still present" (10/02/2016)  . Type II diabetes mellitus (HCC)   . Urinary retention    self caths  . Wears glasses     PAST SURGICAL HISTORY:  Past Surgical History:  Procedure Laterality Date  . ABDOMINAL HYSTERECTOMY  2008  . BILATERAL CARPAL TUNNEL RELEASE Bilateral   . BRONCHIAL WASHINGS  12/10/2023   Procedure: BRONCHIAL WASHINGS;  Surgeon: Martina Sinner, MD;  Location: [facility];  Service: Pulmonary;;  . ECTOPIC PREGNANCY SURGERY  1989  . ENDOBRONCHIAL ULTRASOUND N/A 12/10/2023   Procedure: ENDOBRONCHIAL ULTRASOUND;  Surgeon: Martina Sinner, MD;  Location: [facility];  Service: Pulmonary;  Laterality: N/A;  . EXCISION MELANOMA WITH SENTINEL LYMPH NODE BIOPSY Left 10/02/2016   Procedure: WIDE LOCAL EXCISION AND ADVANCEMENT FLAP CLOSURE LEFT UPPER  MELANOMA WITH SENTINEL LYMPH NODE MAPPING  AND BIOPSY;  Surgeon: Almond Lint, MD;  Location: [facility];  Service: General;  Laterality: Left;  . EYE SURGERY    . FINE NEEDLE ASPIRATION  12/10/2023   Procedure: FINE NEEDLE ASPIRATION (FNA) LINEAR;  Surgeon: Martina Sinner, MD;  Location: [facility];  Service: Pulmonary;;  . LAPAROSCOPIC CHOLECYSTECTOMY  ~ 01/2016  . MELANOMA EXCISION WITH SENTINEL LYMPH NODE BIOPSY Left 10/02/2016   WIDE LOCAL EXCISION AND ADVANCEMENT FLAP CLOSURE LEFT UPPER  MELANOMA WITH SENTINEL LYMPH NODE MAPPING AND BIOPSY  . OVARIAN CYST REMOVAL    . SHOULDER ARTHROSCOPY W/ ROTATOR CUFF REPAIR Right 2014   "partial"  .  STRABISMUS SURGERY Left 1971  . STRABISMUS SURGERY Left 03/18/2020   Procedure: STRABISMUS REPAIR;  Surgeon: Verne Carrow, MD;  Location: [facility];  Service: Ophthalmology;  Laterality: Left;  . THYROIDECTOMY N/A 03/01/2023   Procedure: TOTAL THYROIDECTOMY;  Surgeon: Darnell Level, MD;  Location: [facility];  Service: General;  Laterality: N/A;  . TUMOR EXCISION  1984   from tail bone and part of tail bone removed  . VIDEO BRONCHOSCOPY N/A 12/10/2023   Procedure: VIDEO BRONCHOSCOPY WITHOUT FLUORO;  Surgeon: Martina Sinner, MD;  Location: [facility];  Service: Pulmonary;  Laterality: N/A;    HEMATOLOGY/ONCOLOGY HISTORY:  Oncology History  Adenocarcinoma of upper lobe of right lung (HCC)  12/25/2023 Initial Diagnosis   Adenocarcinoma of upper lobe of right lung (HCC)   12/25/2023 Cancer Staging   Staging form: Lung, AJCC V9 - Clinical: Stage IIIA (cT4, cN1, cM0) - Signed by Si Gaul, MD on 12/25/2023 Method of lymph node assessment: Clinical     ALLERGIES:  is allergic to doxycycline, aspirin, ciprofloxacin, nsaids, amoxicillin-pot clavulanate, gluten meal, lactose, codeine, hydrocodone-acetaminophen, oxycodone, sulfa antibiotics, and sulfasalazine.  MEDICATIONS:  Current Outpatient Medications  Medication Sig Dispense Refill  . acetaminophen (TYLENOL) 500 MG tablet Take 1,000 mg by mouth every 6 (six) hours as needed for moderate pain.    Marland Kitchen aluminum-magnesium hydroxide 200-200 MG/5ML suspension Take by mouth every 6 (six) hours as needed for indigestion.    . benzocaine (ORAJEL) 10 % mucosal gel Use as directed 1 Application in the mouth or throat as needed for mouth pain.    . Cholecalciferol (VITAMIN D3) 125 MCG (5000 UT) capsule Take 5,000 Units by mouth daily.    . Continuous Glucose Sensor (DEXCOM G7 SENSOR) MISC Inject 1 applicator into the skin See admin instructions. Every 10 days    . Cyanocobalamin (B-12) 3000 MCG CAPS Take 3,000 mcg by mouth daily.     . diclofenac Sodium (VOLTAREN) 1 % GEL Apply 2 g topically 4 (four) times daily. (Patient taking differently: Apply 2 g topically 4 (four) times daily as needed (pain).) 50 g 0  . diphenhydrAMINE HCl (BENADRYL ALLERGY PO) Take 1 tablet by mouth at bedtime.    . fluconazole (DIFLUCAN) 150 MG tablet Take 150 mg by mouth See admin instructions. Take 150 mg every 3 days for 3 doses starting on the 15th of every month    . glucose blood (ACCU-CHEK GUIDE) test strip 1 each by Other route as needed.    Marland Kitchen ibuprofen (ADVIL) 400 MG tablet Take 400 mg by mouth every 6 (six) hours as needed for moderate pain (pain score 4-6).    Marland Kitchen insulin detemir (LEVEMIR FLEXTOUCH) 100 UNIT/ML FlexPen Inject 4 Units into the skin 2 (two) times daily.    . insulin detemir (LEVEMIR) 100 UNIT/ML FlexPen Inject 16 Units into the skin  daily for 6 days.    . Insulin lispro (HUMALOG JUNIOR KWIKPEN) 100 UNIT/ML Inject 1-7 Units into the skin as needed (high blood sugar).    . Ketotifen Fumarate ([employer] ALLERGY EYE DROPS OP) Place 1 drop into both eyes 2 (two) times daily.    Marland Kitchen levothyroxine (SYNTHROID) 112 MCG tablet Take 112 mcg by mouth daily before breakfast.    . linaclotide (LINZESS) 145 MCG CAPS capsule Take 1 capsule (145 mcg total) by mouth daily before breakfast. 30 capsule 3  . Magnesium 400 MG CAPS Take 400 mg by mouth daily.    . Melatonin 10-10 MG TBCR Take 10 mg by mouth at bedtime.    . methylphenidate 36 MG PO CR tablet Take 36 mg by mouth every morning.    . Multiple Vitamins-Minerals (MULTIVITAMINS THER. W/MINERALS) TABS Take 1 tablet by mouth daily.    Marland Kitchen nystatin cream (MYCOSTATIN) Apply 1 Application topically 2 (two) times daily.    Marland Kitchen omeprazole (PRILOSEC) 20 MG capsule Take 1 capsule (20 mg total) by mouth 2 (two) times daily before a meal. 60 capsule 1  . ondansetron (ZOFRAN ODT) 4 MG disintegrating tablet Take 1 tablet (4 mg total) by mouth every 6 (six) hours as needed for nausea or vomiting. 30 tablet 1  .  Polyethyl Glycol-Propyl Glycol (SYSTANE OP) Place 1 drop into both eyes 3 (three) times daily as needed (dry eyes).    . polyethylene glycol powder (GLYCOLAX/MIRALAX) 17 GM/SCOOP powder Take 17 g by mouth daily as needed for moderate constipation.    . predniSONE (DELTASONE) 10 MG tablet Takes 6 tablets for 2 days, then 5 tablets for 2 days, then 4 tablets for 2 days, then 3 tablets for 2 days, then 2 tabs for 2 days, then 1 tab for 2 days, and then stop. 42 tablet 0  . predniSONE (DELTASONE) 5 MG tablet Take 1 tablet (5 mg total) by mouth 2 (two) times daily.     No current facility-administered medications for this visit.    VITAL SIGNS: There were no vitals taken for this visit. There were no vitals filed for this visit.  Estimated body mass index is 20.33 kg/m as calculated from the following:   Height as of 12/25/23: 5\' 8"  (1.727 m).   Weight as of 12/25/23: 133 lb 11.2 oz (60.6 kg).  LABS: CBC:    Component Value Date/Time   WBC 15.7 (H) 12/25/2023 1344   WBC 12.8 (H) 12/12/2023 0238   HGB 11.8 (L) 12/25/2023 1344   HCT 36.1 12/25/2023 1344   PLT 430 (H) 12/25/2023 1344   MCV 88.7 12/25/2023 1344   NEUTROABS 13.0 (H) 12/25/2023 1344   LYMPHSABS 1.6 12/25/2023 1344   MONOABS 0.8 12/25/2023 1344   EOSABS 0.1 12/25/2023 1344   BASOSABS 0.1 12/25/2023 1344   Comprehensive Metabolic Panel:    Component Value Date/Time   NA 132 (L) 12/25/2023 1344   K 4.5 12/25/2023 1344   CL 95 (L) 12/25/2023 1344   CO2 31 12/25/2023 1344   BUN 13 12/25/2023 1344   CREATININE 0.80 12/25/2023 1344   GLUCOSE 204 (H) 12/25/2023 1344   CALCIUM 9.2 12/25/2023 1344   AST 25 12/25/2023 1344   ALT 15 12/25/2023 1344   ALKPHOS 140 (H) 12/25/2023 1344   BILITOT 0.4 12/25/2023 1344   PROT 7.5 12/25/2023 1344   ALBUMIN 3.7 12/25/2023 1344    RADIOGRAPHIC STUDIES: CT Angio Chest PE W and/or Wo Contrast Result Date: 12/08/2023 CLINICAL DATA:  Hemoptysis  with right upper chest opacity on same day  chest radiograph EXAM: CT ANGIOGRAPHY CHEST WITH CONTRAST TECHNIQUE: Multidetector CT imaging of the chest was performed using the standard protocol during bolus administration of intravenous contrast. Multiplanar CT image reconstructions and MIPs were obtained to evaluate the vascular anatomy. RADIATION DOSE REDUCTION: This exam was performed according to the departmental dose-optimization program which includes automated exposure control, adjustment of the mA and/or kV according to patient size and/or use of iterative reconstruction technique. CONTRAST:  75mL OMNIPAQUE IOHEXOL 350 MG/ML SOLN COMPARISON:  Same day chest radiograph FINDINGS: Cardiovascular: The study is high quality for the evaluation of pulmonary embolism. There is abrupt cut off of right upper lobe apical subsegmental pulmonary artery (10:132). There are otherwise no filling defects in the central, lobar, segmental or subsegmental pulmonary artery branches to suggest acute pulmonary embolism. Great vessels are normal in course and caliber. Normal heart size. No significant pericardial fluid/thickening. Mediastinum/Nodes: Thyroidectomy. Normal esophagus. 18 mm right hilar lymphadenopathy (4:66). Left axillary surgical clips. Lungs/Pleura: The central airways are patent. Moderate effacement of right upper lobe bronchus and bronchials with suspected complete obstruction of at least 1 segmental airway (12:57) secondary to consolidative mass centered within the posterior right upper lobe measuring 9.9 x 9.9 cm (12:57). This mass crosses the major fissure into the superior segment right lower lobe and extends towards the hilum. There is peripheral ground-glass opacity surrounding the mass with irregular nodule in the right apex measuring 1.1 x 0.5 cm (12:22). Mild upper lobe predominant centrilobular emphysema. Subsegmental right middle lobe and lingular atelectasis. No pneumothorax. No pleural effusion. Upper abdomen: Subtle hypodensity within hepatic  segment 6 measures 4.0 x 3.2 cm (4:151). Musculoskeletal: No acute or abnormal lytic or blastic osseous lesions. Review of the MIP images confirms the above findings. IMPRESSION: 1. Large consolidative mass centered within the posterior right upper lobe measuring 9.9 x 9.9 cm, crossing the major fissure into the superior segment right lower lobe and extending towards the hilum, highly suspicious for primary lung malignancy. 2. Abrupt cut off of right upper lobe apical subsegmental pulmonary artery, which may be secondary to mass effect or pulmonary embolism. No other filling defects in the central, lobar, segmental or subsegmental pulmonary artery branches to suggest acute pulmonary embolism. 3. Effacement and obstruction of at least 1 right upper lobe bronchiole by the mass. 4. Peripheral ground-glass opacity surrounding the mass with irregular nodule in the right apex measuring 1.1 x 0.5 cm, which may be infectious/inflammatory or reflect satellite nodule. 5. Right hilar lymphadenopathy, suspicious for metastatic disease. 6. Subtle hypodensity within hepatic segment 6 measures 4.0 x 3.2 cm, incompletely characterized. Recommend further evaluation with nonemergent contrast-enhanced MRI abdomen. 7.  Emphysema (ICD10-J43.9). Electronically Signed   By: Agustin Cree M.D.   On: 12/08/2023 14:42   PERFORMANCE STATUS (ECOG) : {CHL ONC ECOG ZO:[id number]}  Review of Systems Unless otherwise noted, a complete review of systems is negative.  Physical Exam General: NAD Cardiovascular: regular rate and rhythm Pulmonary: clear ant fields Abdomen: soft, nontender, + bowel sounds Extremities: no edema, no joint deformities Skin: no rashes Neurological: Alert and oriented x3  IMPRESSION: *** I introduced myself, Dalvin Clipper RN, and Palliative's role in collaboration with the oncology team. Concept of Palliative Care was introduced as specialized medical care for people and their families living with serious illness.  It  focuses on providing relief from the symptoms and stress of a serious illness.  The goal is to improve quality of life for both the  patient and the family. Values and goals of care important to patient and family were attempted to be elicited.    We discussed *** current illness and what it means in the larger context of *** on-going co-morbidities. Natural disease trajectory and expectations were discussed.  I discussed the importance of continued conversation with family and their medical providers regarding overall plan of care and treatment options, ensuring decisions are within the context of the patients values and GOCs.  PLAN: Established therapeutic relationship. Education provided on palliative's role in collaboration with their Oncology/Radiation team. I will plan to see patient back in 2-4 weeks in collaboration to other oncology appointments.    Patient expressed understanding and was in agreement with this plan. She also understands that She can call the clinic at any time with any questions, concerns, or complaints.   Thank you for your referral and allowing Palliative to assist in Mrs. Ashley R Mignogna's care.   Number and complexity of problems addressed: ***HIGH - 1 or more chronic illnesses with SEVERE exacerbation, progression, or side effects of treatment - advanced cancer, pain. Any controlled substances utilized were prescribed in the context of palliative care.   Visit consisted of counseling and education dealing with the complex and emotionally intense issues of symptom management and palliative care in the setting of serious and potentially life-threatening illness.  Signed by: Willette Alma, AGPCNP-BC Palliative Medicine Team/[facility]

## 2024-01-16 ENCOUNTER — Encounter: Payer: Self-pay | Admitting: Nurse Practitioner

## 2024-01-16 ENCOUNTER — Inpatient Hospital Stay (HOSPITAL_BASED_OUTPATIENT_CLINIC_OR_DEPARTMENT_OTHER): Payer: Medicaid Other | Admitting: Nurse Practitioner

## 2024-01-16 ENCOUNTER — Telehealth: Payer: Self-pay

## 2024-01-16 NOTE — Telephone Encounter (Signed)
 Pt roomed and waiting for NP palliative visit. Upon entering the room pt not found. Both NP and this RN searched for pt in [facility], also notified NT. Pt called x2 attempts, no answer, LVM and callback number.

## 2024-01-22 NOTE — Progress Notes (Signed)
 error

## 2024-01-24 LAB — ACID FAST CULTURE WITH REFLEXED SENSITIVITIES (MYCOBACTERIA): Acid Fast Culture: NEGATIVE

## 2024-01-29 ENCOUNTER — Ambulatory Visit: Payer: Medicaid Other | Admitting: Pulmonary Disease

## 2024-01-29 ENCOUNTER — Encounter: Payer: Self-pay | Admitting: Pulmonary Disease

## 2024-01-29 VITALS — BP 129/79 | HR 71 | Ht 68.0 in | Wt 132.6 lb

## 2024-01-29 DIAGNOSIS — J432 Centrilobular emphysema: Secondary | ICD-10-CM | POA: Diagnosis not present

## 2024-01-29 DIAGNOSIS — C3491 Malignant neoplasm of unspecified part of right bronchus or lung: Secondary | ICD-10-CM | POA: Diagnosis not present

## 2024-01-29 DIAGNOSIS — M3505 Sjogren syndrome with inflammatory arthritis: Secondary | ICD-10-CM

## 2024-01-29 MED ORDER — ADVAIR HFA 115-21 MCG/ACT IN AERO: 2.0000 | INHALATION_SPRAY | Freq: Two times a day (BID) | RESPIRATORY_TRACT | 12 refills | Status: DC

## 2024-01-29 NOTE — Patient Instructions (Addendum)
 Start advair inhaler 115-58mcg 2 puffs twice daily - rinse mouth out after each use  For allergies, recommend allegra 180mg  daily and can also consider flonase 1 spray per nostril daily  Recommend following up with palliative care for further symptom monitoring and management  Continue on 5-7.5mg  daily for the joint pain/arthritis  Follow up as needed

## 2024-01-29 NOTE — Progress Notes (Signed)
 Synopsis: Hx of non-small cell carcinoma of the lung  Subjective:   PATIENT ID: Mary Erickson GENDER: female DOB: 04-17-1966, MRN: [id number]   HPI  Chief Complaint  Patient presents with   Follow-up    Pt states she has a few questions   Mary Erickson is a 58 year old woman, daily smoker with history of DMI and sjogren's disease who returns to pulmonary clinic for hospital follow up.  She was admitted 1/26 to 1/30 for hemoptysis. She had a chest x-ray which showed a opacification of the right upper lobe which led to CT scan which shows a large greater than 9 cm mass transversing the right upper lobe fissure into the right middle lobe and involving the right pulmonary artery. She underwent bronchoscopy/EBUS on 1/28 with pathology showing non-small cell carcinoma. She was seen 12/25/23 by Dr. Arbutus Ped and initially planned to have radiation therapy, but later changed her mind and has decided not to undergo any treatment. She has follow up appointment with palliative care next week.  She experiences severe fatigue and exhaustion, particularly after physical exertion.  She has increased coughing, though not as severe as during the initial incident, and experiences sharp pains in the top of her right lung, which she attributes to the lung mass. She also reports dizziness and difficulty breathing, particularly in humid conditions.  She has a history of rheumatoid arthritis and has been on prednisone, recently tapered to 5 mg once daily due to gastrointestinal side effects. Attempts to switch to every other day dosing were unsuccessful due to increased joint inflammation and fatigue.  Her family history includes lung conditions, with her mother having had small cell lung cancer and her father having multiple sclerosis. She has support from her stepmother, brother, and neighbor, who are aware of her health situation.  She expresses concern about her ability to exercise outdoors,  especially during allergy season, due to past experiences with pleurisy after [place]. She is worried about the impact of humidity and heat on her breathing, as she has experienced dizziness and difficulty drawing breath.    Past Medical History:  Diagnosis Date   Acid reflux    ADHD (attention deficit hyperactivity disorder)    Anemia    Arthritis    "elbows, hands, neck, shoulders" (10/02/2016)   Calcium blood increased    Chronic neck pain    Chronic UTI (urinary tract infection)    "from self caths" (10/02/2016)   Hashimoto's disease    Hepatitis B    acute hepatitis B from lancet   History of alcoholism (HCC)    10/02/2016 "sober since 03/14/2005"   History of blood transfusion 1984; 1989; 2008   "quite a few"   History of shortness of breath    Hypercholesteremia    Hypothyroidism    no meds   Internal carotid aneurysm dx'd early 2017   Legally blind in left eye, as defined in [place]    Melanoma [facility])    left upper arm   Migraines    "quite a few in a month; at least 15" (10/02/2016)   Neuropathy    neck; "not sure if it's from DM or Sjogren's" (10/02/2016)   Pneumonia 1986   "1 yr after major OR when I was under anesthesia for 13 hr"   Seizures (HCC) ~ 2004; ~ 2013   "seizures from diabetes "   Self-catheterizes urinary bladder    "since OR in 1984" (10/02/2016)   Sjogren's syndrome (  HCC)    Thyroid goiter    "still present" (10/02/2016)   Type II diabetes mellitus (HCC)    Urinary retention    self caths   Wears glasses      Family History  Problem Relation Age of Onset   Multiple sclerosis Father    Colon cancer Neg Hx    Stomach cancer Neg Hx    Colon polyps Neg Hx    Esophageal cancer Neg Hx    Rectal cancer Neg Hx      Social History   Socioeconomic History   Marital status: Divorced    Spouse name: Not on file   Number of children: Not on file   Years of education: Not on file   Highest education level: Not on file   Occupational History   Not on file  Tobacco Use   Smoking status: Former    Current packs/day: 0.00    Average packs/day: 1.5 packs/day for 24.0 years (36.0 ttl pk-yrs)    Types: Cigarettes    Start date: 61    Quit date: 2008    Years since quitting: 17.2   Smokeless tobacco: Never  Vaping Use   Vaping status: Never Used  Substance and Sexual Activity   Alcohol use: No    Comment: 10/02/2016 "sober since 03/14/2005"   Drug use: Not Currently    Types: Marijuana    Comment: 10/02/2016 " occasional; nothing since before 2006"   Sexual activity: Never  Other Topics Concern   Not on file  Social History Narrative   Not on file   Social Drivers of Health   Financial Resource Strain: Patient Declined (08/13/2023)   Received from [facility]   Overall Financial Resource Strain (CARDIA)    Difficulty of Paying Living Expenses: Patient declined  Food Insecurity: No Food Insecurity (12/08/2023)   Hunger Vital Sign    Worried About Running Out of Food in the Last Year: Never true    Ran Out of Food in the Last Year: Never true  Transportation Needs: No Transportation Needs (12/08/2023)   PRAPARE - Administrator, Civil Service (Medical): No    Lack of Transportation (Non-Medical): No  Physical Activity: Unknown (08/13/2023)   Received from [facility]   Exercise Vital Sign    Days of Exercise per Week: Patient declined    Minutes of Exercise per Session: Not on file  Stress: Patient Declined (08/13/2023)   Received from [facility] - Occupational Stress Questionnaire    Feeling of Stress : Patient declined  Social Connections: Unknown (07/26/2023)   Received from [facility]   Social Network    Social Network: Not on file  Intimate Partner Violence: Not At Risk (12/08/2023)   Humiliation, Afraid, Rape, and Kick questionnaire    Fear of Current or Ex-Partner: No    Emotionally Abused: No    Physically Abused: No     Sexually Abused: No     Allergies  Allergen Reactions   Doxycycline Other (See Comments)    Elevates liver enzymes    Aspirin Other (See Comments)    Can take 81 mg but has to be coated; No other Aspirin due to bleeding in stools from ulcers   Ciprofloxacin Hives    Per pt, she can tolerate oral but not IV   Nsaids Other (See Comments)    GI issues    Amoxicillin-Pot Clavulanate Nausea Only   Gluten Meal  Avoid due to autoimmune issues    Lactose     Avoid due to autoimmune issues    Codeine Nausea And Vomiting   Hydrocodone-Acetaminophen Nausea And Vomiting   Oxycodone Nausea And Vomiting   Sulfa Antibiotics Nausea And Vomiting   Sulfasalazine Nausea And Vomiting     Outpatient Medications Prior to Visit  Medication Sig Dispense Refill   acetaminophen (TYLENOL) 500 MG tablet Take 1,000 mg by mouth every 6 (six) hours as needed for moderate pain.     aluminum-magnesium hydroxide 200-200 MG/5ML suspension Take by mouth every 6 (six) hours as needed for indigestion.     benzocaine (ORAJEL) 10 % mucosal gel Use as directed 1 Application in the mouth or throat as needed for mouth pain.     Cholecalciferol (VITAMIN D3) 125 MCG (5000 UT) capsule Take 5,000 Units by mouth daily.     Continuous Glucose Sensor (DEXCOM G7 SENSOR) MISC Inject 1 applicator into the skin See admin instructions. Every 10 days     Cyanocobalamin (B-12) 3000 MCG CAPS Take 3,000 mcg by mouth daily.     diclofenac Sodium (VOLTAREN) 1 % GEL Apply 2 g topically 4 (four) times daily. (Patient taking differently: Apply 2 g topically 4 (four) times daily as needed (pain).) 50 g 0   diphenhydrAMINE HCl (BENADRYL ALLERGY PO) Take 1 tablet by mouth at bedtime.     fluconazole (DIFLUCAN) 150 MG tablet Take 150 mg by mouth See admin instructions. Take 150 mg every 3 days for 3 doses starting on the 15th of every month     glucose blood (ACCU-CHEK GUIDE) test strip 1 each by Other route as needed.     ibuprofen (ADVIL)  400 MG tablet Take 400 mg by mouth every 6 (six) hours as needed for moderate pain (pain score 4-6).     insulin detemir (LEVEMIR FLEXTOUCH) 100 UNIT/ML FlexPen Inject 4 Units into the skin 2 (two) times daily.     Insulin lispro (HUMALOG JUNIOR KWIKPEN) 100 UNIT/ML Inject 1-7 Units into the skin as needed (high blood sugar).     Ketotifen Fumarate ([employer] ALLERGY EYE DROPS OP) Place 1 drop into both eyes 2 (two) times daily.     levothyroxine (SYNTHROID) 112 MCG tablet Take 112 mcg by mouth daily before breakfast.     linaclotide (LINZESS) 145 MCG CAPS capsule Take 1 capsule (145 mcg total) by mouth daily before breakfast. 30 capsule 3   Magnesium 400 MG CAPS Take 400 mg by mouth daily.     Melatonin 10-10 MG TBCR Take 10 mg by mouth at bedtime.     methylphenidate 36 MG PO CR tablet Take 36 mg by mouth every morning.     Multiple Vitamins-Minerals (MULTIVITAMINS THER. W/MINERALS) TABS Take 1 tablet by mouth daily.     nystatin cream (MYCOSTATIN) Apply 1 Application topically 2 (two) times daily.     omeprazole (PRILOSEC) 20 MG capsule Take 1 capsule (20 mg total) by mouth 2 (two) times daily before a meal. 60 capsule 1   ondansetron (ZOFRAN ODT) 4 MG disintegrating tablet Take 1 tablet (4 mg total) by mouth every 6 (six) hours as needed for nausea or vomiting. 30 tablet 1   Polyethyl Glycol-Propyl Glycol (SYSTANE OP) Place 1 drop into both eyes 3 (three) times daily as needed (dry eyes).     polyethylene glycol powder (GLYCOLAX/MIRALAX) 17 GM/SCOOP powder Take 17 g by mouth daily as needed for moderate constipation.     predniSONE (DELTASONE) 10 MG  tablet Takes 6 tablets for 2 days, then 5 tablets for 2 days, then 4 tablets for 2 days, then 3 tablets for 2 days, then 2 tabs for 2 days, then 1 tab for 2 days, and then stop. 42 tablet 0   predniSONE (DELTASONE) 5 MG tablet Take 1 tablet (5 mg total) by mouth 2 (two) times daily.     insulin detemir (LEVEMIR) 100 UNIT/ML FlexPen Inject 16 Units into the  skin daily for 6 days.     No facility-administered medications prior to visit.   Review of Systems  Constitutional:  Positive for chills and malaise/fatigue. Negative for fever and weight loss.  HENT:  Negative for congestion, sinus pain and sore throat.   Eyes: Negative.   Respiratory:  Positive for cough and shortness of breath. Negative for hemoptysis, sputum production and wheezing.   Cardiovascular:  Positive for chest pain. Negative for palpitations, orthopnea, claudication and leg swelling.  Gastrointestinal:  Negative for abdominal pain, heartburn, nausea and vomiting.  Genitourinary: Negative.   Musculoskeletal:  Negative for joint pain and myalgias.  Skin:  Negative for rash.  Neurological:  Negative for weakness.  Endo/Heme/Allergies: Negative.   Psychiatric/Behavioral: Negative.      Objective:   Vitals:   01/29/24 1115  BP: 129/79  Pulse: 71  SpO2: 100%  Weight: 132 lb 9.6 oz (60.1 kg)  Height: 5\' 8"  (1.727 m)   Physical Exam Constitutional:      General: She is not in acute distress.    Appearance: Normal appearance.  Eyes:     General: No scleral icterus.    Conjunctiva/sclera: Conjunctivae normal.  Cardiovascular:     Rate and Rhythm: Normal rate and regular rhythm.  Pulmonary:     Breath sounds: No wheezing, rhonchi or rales.  Musculoskeletal:     Right lower leg: No edema.     Left lower leg: No edema.  Skin:    General: Skin is warm and dry.  Neurological:     General: No focal deficit present.     CBC    Component Value Date/Time   WBC 15.7 (H) 12/25/2023 1344   WBC 12.8 (H) 12/12/2023 0238   RBC 4.07 12/25/2023 1344   HGB 11.8 (L) 12/25/2023 1344   HCT 36.1 12/25/2023 1344   PLT 430 (H) 12/25/2023 1344   MCV 88.7 12/25/2023 1344   MCH 29.0 12/25/2023 1344   MCHC 32.7 12/25/2023 1344   RDW 14.1 12/25/2023 1344   LYMPHSABS 1.6 12/25/2023 1344   MONOABS 0.8 12/25/2023 1344   EOSABS 0.1 12/25/2023 1344   BASOSABS 0.1 12/25/2023 1344       Latest Ref Rng & Units 12/25/2023    1:44 PM 12/10/2023   12:49 AM 12/09/2023   12:38 AM  BMP  Glucose 70 - 99 mg/dL 295  82  188   BUN 6 - 20 mg/dL 13  13  14    Creatinine 0.44 - 1.00 mg/dL 4.16  6.06  3.01   Sodium 135 - 145 mmol/L 132  130  133   Potassium 3.5 - 5.1 mmol/L 4.5  3.9  3.9   Chloride 98 - 111 mmol/L 95  99  100   CO2 22 - 32 mmol/L 31  21  24    Calcium 8.9 - 10.3 mg/dL 9.2  8.8  8.6    Chest imaging: CT Chest 12/08/23 1. Large consolidative mass centered within the posterior right upper lobe measuring 9.9 x 9.9 cm, crossing the major fissure into  the superior segment right lower lobe and extending towards the hilum, highly suspicious for primary lung malignancy. 2. Abrupt cut off of right upper lobe apical subsegmental pulmonary artery, which may be secondary to mass effect or pulmonary embolism. No other filling defects in the central, lobar, segmental or subsegmental pulmonary artery branches to suggest acute pulmonary embolism. 3. Effacement and obstruction of at least 1 right upper lobe bronchiole by the mass. 4. Peripheral ground-glass opacity surrounding the mass with irregular nodule in the right apex measuring 1.1 x 0.5 cm, which may be infectious/inflammatory or reflect satellite nodule. 5. Right hilar lymphadenopathy, suspicious for metastatic disease. 6. Subtle hypodensity within hepatic segment 6 measures 4.0 x 3.2 cm, incompletely characterized. Recommend further evaluation with nonemergent contrast-enhanced MRI abdomen. 7.  Emphysema (ICD10-J43.9).  PFT:     No data to display          Labs:  Path:  Echo:  Heart Catheterization:    Assessment & Plan:   Non-small cell cancer of right lung (HCC)  Centrilobular emphysema (HCC) - Plan: fluticasone-salmeterol (ADVAIR HFA) 115-21 MCG/ACT inhaler  Discussion: Mary Erickson is a 58 year old woman, daily smoker with history of DMI, sjogren's disease, and non-small cell lung  cancer who returns to pulmonary clinic for hospital follow up.  Lung cancer Confirmed lung cancer. Declined radiation therapy due to quality of life concerns. Engaged with palliative care for symptom management. - Continue palliative care management. - Monitor for infection symptoms, more concerning for post obstructive pneumonia. - Consider broad-spectrum antibiotics if infection signs develop.  Emphysema - start advair inhaler 115-67mcg 2 puffs twice daily  Allergies - start allegra 180mg  daily - flonase 1 spray per nostril daily  Sjogren's Syndrome Arthritis Joint pain managed with prednisone and non-pharmacological measures. Concern about inflammation from physical activity. - Continue management with compression socks and Voltaren gel - Recommend 5-7.5mg  of prednisone daily for joint pain  Goals of Care Prioritizes quality of life, declined aggressive cancer treatments. Supported by family and friends, focusing on comfort and symptom management. - Engage with palliative care team for ongoing support and symptom management. - Discuss and document advance care planning and goals of care.  Follow up as needed  Melody Comas, MD [facility] Office: [phone]   Current Outpatient Medications:    acetaminophen (TYLENOL) 500 MG tablet, Take 1,000 mg by mouth every 6 (six) hours as needed for moderate pain., Disp: , Rfl:    aluminum-magnesium hydroxide 200-200 MG/5ML suspension, Take by mouth every 6 (six) hours as needed for indigestion., Disp: , Rfl:    benzocaine (ORAJEL) 10 % mucosal gel, Use as directed 1 Application in the mouth or throat as needed for mouth pain., Disp: , Rfl:    Cholecalciferol (VITAMIN D3) 125 MCG (5000 UT) capsule, Take 5,000 Units by mouth daily., Disp: , Rfl:    Continuous Glucose Sensor (DEXCOM G7 SENSOR) MISC, Inject 1 applicator into the skin See admin instructions. Every 10 days, Disp: , Rfl:    Cyanocobalamin (B-12) 3000  MCG CAPS, Take 3,000 mcg by mouth daily., Disp: , Rfl:    diclofenac Sodium (VOLTAREN) 1 % GEL, Apply 2 g topically 4 (four) times daily. (Patient taking differently: Apply 2 g topically 4 (four) times daily as needed (pain).), Disp: 50 g, Rfl: 0   diphenhydrAMINE HCl (BENADRYL ALLERGY PO), Take 1 tablet by mouth at bedtime., Disp: , Rfl:    fluconazole (DIFLUCAN) 150 MG tablet, Take 150 mg by mouth See admin  instructions. Take 150 mg every 3 days for 3 doses starting on the 15th of every month, Disp: , Rfl:    fluticasone-salmeterol (ADVAIR HFA) 115-21 MCG/ACT inhaler, Inhale 2 puffs into the lungs 2 (two) times daily., Disp: 12 g, Rfl: 12   glucose blood (ACCU-CHEK GUIDE) test strip, 1 each by Other route as needed., Disp: , Rfl:    ibuprofen (ADVIL) 400 MG tablet, Take 400 mg by mouth every 6 (six) hours as needed for moderate pain (pain score 4-6)., Disp: , Rfl:    insulin detemir (LEVEMIR FLEXTOUCH) 100 UNIT/ML FlexPen, Inject 4 Units into the skin 2 (two) times daily., Disp: , Rfl:    Insulin lispro (HUMALOG JUNIOR KWIKPEN) 100 UNIT/ML, Inject 1-7 Units into the skin as needed (high blood sugar)., Disp: , Rfl:    Ketotifen Fumarate ([employer] ALLERGY EYE DROPS OP), Place 1 drop into both eyes 2 (two) times daily., Disp: , Rfl:    levothyroxine (SYNTHROID) 112 MCG tablet, Take 112 mcg by mouth daily before breakfast., Disp: , Rfl:    linaclotide (LINZESS) 145 MCG CAPS capsule, Take 1 capsule (145 mcg total) by mouth daily before breakfast., Disp: 30 capsule, Rfl: 3   Magnesium 400 MG CAPS, Take 400 mg by mouth daily., Disp: , Rfl:    Melatonin 10-10 MG TBCR, Take 10 mg by mouth at bedtime., Disp: , Rfl:    methylphenidate 36 MG PO CR tablet, Take 36 mg by mouth every morning., Disp: , Rfl:    Multiple Vitamins-Minerals (MULTIVITAMINS THER. W/MINERALS) TABS, Take 1 tablet by mouth daily., Disp: , Rfl:    nystatin cream (MYCOSTATIN), Apply 1 Application topically 2 (two) times daily., Disp: , Rfl:     omeprazole (PRILOSEC) 20 MG capsule, Take 1 capsule (20 mg total) by mouth 2 (two) times daily before a meal., Disp: 60 capsule, Rfl: 1   ondansetron (ZOFRAN ODT) 4 MG disintegrating tablet, Take 1 tablet (4 mg total) by mouth every 6 (six) hours as needed for nausea or vomiting., Disp: 30 tablet, Rfl: 1   Polyethyl Glycol-Propyl Glycol (SYSTANE OP), Place 1 drop into both eyes 3 (three) times daily as needed (dry eyes)., Disp: , Rfl:    polyethylene glycol powder (GLYCOLAX/MIRALAX) 17 GM/SCOOP powder, Take 17 g by mouth daily as needed for moderate constipation., Disp: , Rfl:    predniSONE (DELTASONE) 10 MG tablet, Takes 6 tablets for 2 days, then 5 tablets for 2 days, then 4 tablets for 2 days, then 3 tablets for 2 days, then 2 tabs for 2 days, then 1 tab for 2 days, and then stop., Disp: 42 tablet, Rfl: 0   predniSONE (DELTASONE) 5 MG tablet, Take 1 tablet (5 mg total) by mouth 2 (two) times daily., Disp: , Rfl:    insulin detemir (LEVEMIR) 100 UNIT/ML FlexPen, Inject 16 Units into the skin daily for 6 days., Disp: , Rfl:

## 2024-01-31 ENCOUNTER — Emergency Department (HOSPITAL_COMMUNITY)

## 2024-01-31 ENCOUNTER — Encounter (HOSPITAL_COMMUNITY): Payer: Self-pay

## 2024-01-31 ENCOUNTER — Emergency Department (HOSPITAL_COMMUNITY)
Admission: EM | Admit: 2024-01-31 | Discharge: 2024-01-31 | Disposition: A | Discharge status: Home / Self Care | Attending: Emergency Medicine | Admitting: Emergency Medicine

## 2024-01-31 ENCOUNTER — Ambulatory Visit: Payer: Self-pay | Admitting: Pulmonary Disease

## 2024-01-31 ENCOUNTER — Other Ambulatory Visit: Payer: Self-pay

## 2024-01-31 DIAGNOSIS — Z794 Long term (current) use of insulin: Secondary | ICD-10-CM | POA: Diagnosis not present

## 2024-01-31 DIAGNOSIS — D72829 Elevated white blood cell count, unspecified: Secondary | ICD-10-CM | POA: Insufficient documentation

## 2024-01-31 DIAGNOSIS — J189 Pneumonia, unspecified organism: Secondary | ICD-10-CM

## 2024-01-31 DIAGNOSIS — E109 Type 1 diabetes mellitus without complications: Secondary | ICD-10-CM | POA: Insufficient documentation

## 2024-01-31 DIAGNOSIS — R042 Hemoptysis: Secondary | ICD-10-CM | POA: Diagnosis present

## 2024-01-31 DIAGNOSIS — C3490 Malignant neoplasm of unspecified part of unspecified bronchus or lung: Secondary | ICD-10-CM | POA: Diagnosis not present

## 2024-01-31 DIAGNOSIS — J181 Lobar pneumonia, unspecified organism: Secondary | ICD-10-CM | POA: Insufficient documentation

## 2024-01-31 DIAGNOSIS — Z87891 Personal history of nicotine dependence: Secondary | ICD-10-CM | POA: Insufficient documentation

## 2024-01-31 LAB — COMPREHENSIVE METABOLIC PANEL
ALT: 25 U/L (ref 0–44)
AST: 34 U/L (ref 15–41)
Albumin: 3.4 g/dL — ABNORMAL LOW (ref 3.5–5.0)
Alkaline Phosphatase: 146 U/L — ABNORMAL HIGH (ref 38–126)
Anion gap: 11 (ref 5–15)
BUN: 9 mg/dL (ref 6–20)
CO2: 23 mmol/L (ref 22–32)
Calcium: 9 mg/dL (ref 8.9–10.3)
Chloride: 99 mmol/L (ref 98–111)
Creatinine, Ser: 0.68 mg/dL (ref 0.44–1.00)
GFR, Estimated: >60 mL/min (ref >60)
Glucose, Bld: 114 mg/dL — ABNORMAL HIGH (ref 70–99)
Potassium: 4 mmol/L (ref 3.5–5.1)
Sodium: 133 mmol/L — ABNORMAL LOW (ref 135–145)
Total Bilirubin: 0.2 mg/dL (ref 0.0–1.2)
Total Protein: 7.9 g/dL (ref 6.5–8.1)

## 2024-01-31 LAB — BLOOD GAS, VENOUS
Acid-Base Excess: 3.3 mmol/L — ABNORMAL HIGH (ref 0.0–2.0)
Bicarbonate: 27.8 mmol/L (ref 20.0–28.0)
O2 Saturation: 82.2 %
Patient temperature: 37
pCO2, Ven: 41 mmHg — ABNORMAL LOW (ref 44–60)
pH, Ven: 7.44 — ABNORMAL HIGH (ref 7.25–7.43)
pO2, Ven: 48 mmHg — ABNORMAL HIGH (ref 32–45)

## 2024-01-31 LAB — URINALYSIS, W/ REFLEX TO CULTURE (INFECTION SUSPECTED)
Bacteria, UA: NONE SEEN
Bilirubin Urine: NEGATIVE
Glucose, UA: NEGATIVE mg/dL
Hgb urine dipstick: NEGATIVE
Ketones, ur: NEGATIVE mg/dL
Nitrite: NEGATIVE
Protein, ur: NEGATIVE mg/dL
Specific Gravity, Urine: 1.002 — ABNORMAL LOW (ref 1.005–1.030)
pH: 7 (ref 5.0–8.0)

## 2024-01-31 LAB — CBC WITH DIFFERENTIAL/PLATELET
Abs Immature Granulocytes: 0.11 10*3/uL — ABNORMAL HIGH (ref 0.00–0.07)
Basophils Absolute: 0.1 10*3/uL (ref 0.0–0.1)
Basophils Relative: 1 %
Eosinophils Absolute: 0 10*3/uL (ref 0.0–0.5)
Eosinophils Relative: 0 %
HCT: 36.4 % (ref 36.0–46.0)
Hemoglobin: 11.1 g/dL — ABNORMAL LOW (ref 12.0–15.0)
Immature Granulocytes: 1 %
Lymphocytes Relative: 5 %
Lymphs Abs: 0.9 10*3/uL (ref 0.7–4.0)
MCH: 28.3 pg (ref 26.0–34.0)
MCHC: 30.5 g/dL (ref 30.0–36.0)
MCV: 92.9 fL (ref 80.0–100.0)
Monocytes Absolute: 0.5 10*3/uL (ref 0.1–1.0)
Monocytes Relative: 3 %
Neutro Abs: 17.4 10*3/uL — ABNORMAL HIGH (ref 1.7–7.7)
Neutrophils Relative %: 90 %
Platelets: 424 10*3/uL — ABNORMAL HIGH (ref 150–400)
RBC: 3.92 MIL/uL (ref 3.87–5.11)
RDW: 14.2 % (ref 11.5–15.5)
WBC: 19.1 10*3/uL — ABNORMAL HIGH (ref 4.0–10.5)
nRBC: 0 % (ref 0.0–0.2)

## 2024-01-31 LAB — RESP PANEL BY RT-PCR (RSV, FLU A&B, COVID)  RVPGX2
Influenza A by PCR: NEGATIVE
Influenza B by PCR: NEGATIVE
Resp Syncytial Virus by PCR: NEGATIVE
SARS Coronavirus 2 by RT PCR: NEGATIVE

## 2024-01-31 LAB — TROPONIN I (HIGH SENSITIVITY): Troponin I (High Sensitivity): <2 ng/L (ref <18)

## 2024-01-31 MED ORDER — LACTATED RINGERS IV BOLUS
1000.0000 mL | Freq: Once | INTRAVENOUS | Status: AC
  Administered 2024-01-31: 1000 mL via INTRAVENOUS

## 2024-01-31 MED ORDER — AMOXICILLIN 500 MG PO CAPS: 500.0000 mg | ORAL_CAPSULE | Freq: Three times a day (TID) | ORAL | 0 refills | Status: DC

## 2024-01-31 MED ORDER — AMOXICILLIN 500 MG PO CAPS
500.0000 mg | ORAL_CAPSULE | Freq: Once | ORAL | Status: AC
  Administered 2024-01-31: 500 mg via ORAL
  Filled 2024-01-31: qty 1

## 2024-01-31 MED ORDER — IOHEXOL 350 MG/ML SOLN
75.0000 mL | Freq: Once | INTRAVENOUS | Status: AC | PRN
  Administered 2024-01-31: 75 mL via INTRAVENOUS

## 2024-01-31 NOTE — Telephone Encounter (Signed)
 Dr. Francine Graven,  This is just an FYI, this patient went to the ED.  Thank you.

## 2024-01-31 NOTE — ED Provider Notes (Signed)
 [name] EMERGENCY DEPARTMENT AT [facility] Provider Note   CSN: [id number] Arrival date & time: 01/31/24  1011     History  Chief Complaint  Patient presents with   Hemoptysis    Mary Erickson is a 58 y.o. female.  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 Arbutus Ped and initially planned to have radiation therapy, but later changed her mind and has decided not to undergo any treatment who is currently on daily prednisone therapy but presents today with complaints of feeling worse in the last few days with recurrent hemoptysis.  She reports she did have hemoptysis in January when she was admitted.  She has not had any until the last 2 days.  She reports that when she coughed she had about a teaspoon to 2 bright red blood but no blood clots.  She is also noticed over the last few days to a week that she has been more winded at rest but especially with exertion.  She intermittently feels hot and cold but denies having a fever.  She reports being very fatigued and generally worn down.  She thinks she may have overdone it.  She did see Dr. Francine Graven her pulmonologist 2 days ago and at that time was requested that she increase her prednisone back to 7.5 as she was attempting to wean it down.  She reports she has taken a total of 15 mg of prednisone today.  She complains of pain with coughing in her chest but denies ongoing pain without coughing.  She has had no vomiting but reports the prednisone makes her nauseated.  She also has noticed for the last few days her urine has been intermittently cloudy but denies any dysuria frequency or urgency.  The pulmonology office has sent her  a prescription for Advair which she was going to start using today but when she told them she was having hemoptysis they recommended that she come to the emergency room which she was not excited about doing.  She does not use any oxygen at home.  The history is provided by the patient and medical records.       Home Medications Prior to Admission medications   Medication Sig Start Date End Date Taking? Authorizing Provider  acetaminophen (TYLENOL) 500 MG tablet Take 1,000 mg by mouth every 6 (six) hours as needed for moderate pain.    [provider]  aluminum-magnesium hydroxide 200-200 MG/5ML suspension Take by mouth every 6 (six) hours as needed for indigestion.    [provider]  benzocaine (ORAJEL) 10 % mucosal gel Use as directed 1 Application in the mouth or throat as needed for mouth pain.    [provider]  Cholecalciferol (VITAMIN D3) 125 MCG (5000 UT) capsule Take 5,000 Units by mouth daily.    [provider]  Continuous Glucose Sensor (DEXCOM G7 SENSOR) MISC Inject 1 applicator into the skin See admin instructions. Every 10 days 11/27/23   [provider]  Cyanocobalamin (B-12) 3000 MCG CAPS Take 3,000 mcg by mouth daily.    [provider]  diclofenac Sodium (VOLTAREN) 1 % GEL Apply 2 g topically 4 (four) times daily. Patient taking differently: Apply 2  g topically 4 (four) times daily as needed (pain). 12/17/19   Darr, Gerilyn Pilgrim, PA-C  diphenhydrAMINE HCl (BENADRYL ALLERGY PO) Take 1 tablet by mouth at bedtime.    [provider]  fluconazole (DIFLUCAN) 150 MG tablet Take 150 mg by mouth See admin instructions. Take 150 mg every 3 days for 3 doses starting on the 15th of every month    [provider]  fluticasone-salmeterol (ADVAIR HFA) 115-21 MCG/ACT inhaler Inhale 2 puffs into the lungs 2 (two) times daily. 01/29/24   Martina Sinner, MD  glucose blood (ACCU-CHEK GUIDE) test strip 1 each by Other route as  needed. 06/05/23   [provider]  ibuprofen (ADVIL) 400 MG tablet Take 400 mg by mouth every 6 (six) hours as needed for moderate pain (pain score 4-6).    [provider]  insulin detemir (LEVEMIR FLEXTOUCH) 100 UNIT/ML FlexPen Inject 4 Units into the skin 2 (two) times daily. 12/18/23   Marinda Elk, MD  insulin detemir (LEVEMIR) 100 UNIT/ML FlexPen Inject 16 Units into the skin daily for 6 days. 12/12/23 12/18/23  Marinda Elk, MD  Insulin lispro (HUMALOG JUNIOR [facility]) 100 UNIT/ML Inject 1-7 Units into the skin as needed (high blood sugar).    [provider]  Ketotifen Fumarate ([employer] ALLERGY EYE DROPS OP) Place 1 drop into both eyes 2 (two) times daily.    [provider]  levothyroxine (SYNTHROID) 112 MCG tablet Take 112 mcg by mouth daily before breakfast.    [provider]  linaclotide (LINZESS) 145 MCG CAPS capsule Take 1 capsule (145 mcg total) by mouth daily before breakfast. 11/14/23   Imogene Burn, MD  Magnesium 400 MG CAPS Take 400 mg by mouth daily.    [provider]  Melatonin 10-10 MG TBCR Take 10 mg by mouth at bedtime.    [provider]  methylphenidate 36 MG PO CR tablet Take 36 mg by mouth every morning.    [provider]  Multiple Vitamins-Minerals (MULTIVITAMINS THER. W/MINERALS) TABS Take 1 tablet by mouth daily.    [provider]  nystatin cream (MYCOSTATIN) Apply 1 Application topically 2 (two) times daily.    [provider]  omeprazole (PRILOSEC) 20 MG capsule Take 1 capsule (20 mg total) by mouth 2 (two) times daily before a meal. 11/14/23   Imogene Burn, MD  ondansetron (ZOFRAN ODT) 4 MG disintegrating tablet Take 1 tablet (4 mg total) by mouth every 6 (six) hours as needed for nausea or vomiting. 06/18/23   Esterwood, Amy S, PA-C  Polyethyl Glycol-Propyl Glycol (SYSTANE OP) Place 1 drop into both eyes 3 (three) times daily as needed (dry eyes).    [provider]  polyethylene glycol powder (GLYCOLAX/MIRALAX) 17 GM/SCOOP powder Take 17 g by mouth daily as needed for moderate constipation.    [provider]  predniSONE (DELTASONE) 10 MG tablet Takes 6 tablets for 2 days, then 5 tablets for 2 days, then 4 tablets for 2 days, then 3 tablets for 2 days, then 2 tabs for 2 days, then 1 tab for 2 days, and then stop. 12/12/23   Marinda Elk, MD  predniSONE (DELTASONE) 5 MG tablet Take 1 tablet (5 mg total) by mouth 2 (two) times daily. 12/25/23   Marinda Elk, MD      Allergies    Doxycycline, Aspirin, Ciprofloxacin, Nsaids, Amoxicillin-pot clavulanate, Gluten meal, Lactose, Codeine, Hydrocodone-acetaminophen, Oxycodone, Sulfa antibiotics, and Sulfasalazine    Review of Systems  Review of Systems  Physical Exam Updated Vital Signs BP 125/80 (BP Location: Right Arm)   Pulse 74   Temp 98.8 F (37.1 C) (Oral)   Resp 18   Ht 5\' 8"  (1.727 m)   Wt 59.4 kg   SpO2 98%   BMI 19.92 kg/m  Physical Exam Vitals and nursing note reviewed.  Constitutional:      General: She is not in acute distress.    Appearance: She is well-developed and normal weight.  HENT:     Head: Normocephalic and atraumatic.     Right Ear: Tympanic membrane normal.     Left Ear: Tympanic membrane normal.     Nose: Nose normal.     Mouth/Throat:     Mouth: Mucous membranes are dry.  Eyes:     Pupils: Pupils are equal, round, and reactive to light.  Cardiovascular:     Rate and Rhythm: Normal rate and regular rhythm.     Heart sounds: Normal heart sounds. No murmur heard.    No friction rub.  Pulmonary:     Effort: Pulmonary effort is normal.     Breath sounds: Normal breath sounds. No wheezing or rales.  Abdominal:     General: Bowel sounds are normal. There is no distension.     Palpations: Abdomen is soft.     Tenderness: There is no abdominal tenderness. There is no guarding or rebound.  Musculoskeletal:        General: No  tenderness. Normal range of motion.     Cervical back: Normal range of motion and neck supple.     Comments: No edema  Skin:    General: Skin is warm and dry.     Findings: No rash.  Neurological:     Mental Status: She is alert and oriented to person, place, and time. Mental status is at baseline.     Cranial Nerves: No cranial nerve deficit.  Psychiatric:        Behavior: Behavior normal.     ED Results / Procedures / Treatments   Labs (all labs ordered are listed, but only abnormal results are displayed) Labs Reviewed  CBC WITH DIFFERENTIAL/PLATELET - Abnormal; Notable for the following components:      Result Value   WBC 19.1 (*)    Hemoglobin 11.1 (*)    Platelets 424 (*)    Neutro Abs 17.4 (*)    Abs Immature Granulocytes 0.11 (*)    All other components within normal limits  COMPREHENSIVE METABOLIC PANEL - Abnormal; Notable for the following components:   Sodium 133 (*)    Glucose, Bld 114 (*)    Albumin 3.4 (*)    Alkaline Phosphatase 146 (*)    All other components within normal limits  BLOOD GAS, VENOUS - Abnormal; Notable for the following components:   pH, Ven 7.44 (*)    pCO2, Ven 41 (*)    pO2, Ven 48 (*)    Acid-Base Excess 3.3 (*)    All other components within normal limits  URINALYSIS, W/ REFLEX TO CULTURE (INFECTION SUSPECTED) - Abnormal; Notable for the following components:   Color, Urine COLORLESS (*)    Specific Gravity, Urine 1.002 (*)    Leukocytes,Ua MODERATE (*)    All other components within normal limits  RESP PANEL BY RT-PCR (RSV, FLU A&B, COVID)  RVPGX2  TROPONIN I (HIGH SENSITIVITY)    EKG None  Radiology CT Angio Chest PE W and/or Wo Contrast Result Date: 01/31/2024 CLINICAL DATA:  Hemoptysis. Recent diagnosis of non-small cell lung carcinoma. EXAM: CT ANGIOGRAPHY CHEST WITH CONTRAST TECHNIQUE: Multidetector CT imaging of the chest was performed using the standard protocol during bolus administration of intravenous contrast.  Multiplanar CT image reconstructions and MIPs were obtained to evaluate the vascular anatomy. RADIATION DOSE REDUCTION: This exam was performed according to the departmental dose-optimization program which includes automated exposure control, adjustment of the mA and/or kV according to patient size and/or use of iterative reconstruction technique. CONTRAST:  75mL OMNIPAQUE IOHEXOL 350 MG/ML SOLN COMPARISON:  CT angiography chest from 12/08/2023. FINDINGS: Cardiovascular: There is abrupt cut off of the proximal segmental pulmonary artery branch to posterior segment of right upper lobe, which is similar to the prior study. However, there is very small volume nonocclusive thrombus in the proximal subsegmental pulmonary artery branch to apical segment of right upper lobe (series 4, images 40 8-49), new since the prior study but otherwise of indeterminate age. Remaining pulmonary artery branches up to the proximal subsegmental level are patent. No right heart strain or lung infarction. Mild cardiomegaly. No pericardial effusion. No aortic aneurysm. Mediastinum/Nodes: Visualized thyroid gland appears grossly unremarkable. No solid / cystic mediastinal masses. The esophagus is nondistended precluding optimal assessment. No axillary, mediastinal or hilar lymphadenopathy by size criteria. Lungs/Pleura: The central tracheo-bronchial tree is patent. Redemonstration of large approximately 10.1 x 10.8 cm mass centered in the posterior segment of right upper lobe with extension into the superior segment of right lower lobe. There is smooth interlobular septal thickening, ground-glass opacities and few additional opacities in the apical segment of right upper lobe, which is likely due to post obstructive pneumonia. There are patchy opacities throughout bilateral lungs, nonspecific. Upper Abdomen: Surgically absent gallbladder. Visualized upper abdominal viscera within normal limits. Musculoskeletal: The visualized soft tissues of  the chest wall are grossly unremarkable. No suspicious osseous lesions. There are mild multilevel degenerative changes in the visualized spine. Review of the MIP images confirms the above findings. IMPRESSION: 1. There is abrupt cut off of the proximal segmental pulmonary artery branch to posterior segment of right upper lobe, which is similar to the prior study and due to tumor in the right upper lobe. However, there is very small volume nonocclusive thrombus in the proximal subsegmental pulmonary artery branch to apical segment of right upper lobe, new since the prior study but otherwise of indeterminate age. Remaining pulmonary artery branches up to the proximal subsegmental level are patent. No right heart strain or lung infarction. 2. Redemonstration of large approximately 10.1 x 10.8 cm mass centered in the posterior segment of right upper lobe with extension into the superior segment of right lower lobe. There is smooth interlobular septal thickening, ground-glass opacities and few additional opacities in the apical segment of right upper lobe, which is likely due to post obstructive pneumonia. 3. Multiple other nonacute observations, as described above. Electronically Signed   By: Jules Schick M.D.   On: 01/31/2024 14:50   DG Chest Port 1 View Result Date: 01/31/2024 CLINICAL DATA:  Hemoptysis. EXAM: PORTABLE CHEST 1 VIEW COMPARISON:  12/10/2023. FINDINGS: Redemonstration of an approximately 11 x 11 cm opacity overlying the right upper mid lung zones without volume loss, compatible with patient's known malignancy. No significant interval change. Bilateral lung fields are otherwise clear. Bilateral costophrenic angles are clear. Normal cardio-mediastinal silhouette. No acute osseous abnormalities. The soft tissues are within normal limits. IMPRESSION: *No acute cardiopulmonary abnormality. *Redemonstration of an approximately 11 x 11 cm opacity overlying the right upper mid lung zones  without volume  loss, compatible with patient's known malignancy. No significant interval change. Electronically Signed   By: Jules Schick M.D.   On: 01/31/2024 14:39    Procedures Procedures    Medications Ordered in ED Medications  amoxicillin (AMOXIL) capsule 500 mg (has no administration in time range)  lactated ringers bolus 1,000 mL (1,000 mLs Intravenous New Bag/Given 01/31/24 1132)  iohexol (OMNIPAQUE) 350 MG/ML injection 75 mL (75 mLs Intravenous Contrast Given 01/31/24 1400)    ED Course/ Medical Decision Making/ A&P                                 Medical Decision Making Amount and/or Complexity of Data Reviewed External Data Reviewed: notes. Labs: ordered. Decision-making details documented in ED Course. Radiology: ordered and independent interpretation performed. Decision-making details documented in ED Course. ECG/medicine tests: ordered and independent interpretation performed. Decision-making details documented in ED Course.  Risk Prescription drug management.   Pt with multiple medical problems and comorbidities and presenting today with a complaint that caries a high risk for morbidity and mortality.  Here today with complaint of hemoptysis in the setting of known lung cancer that is invading into the pulmonary artery.  Patient has decided not to undergo treatment for this and is planning on following up with palliative care.  Patient reports overall just being worn down and having new dyspnea on exertion and at rest.  Lung sounds are clear at this time and oxygen saturation is 100%.  Vital signs are normal.  Patient does not take any anticoagulation.  Concern for pulmonary hemorrhage versus pneumonia versus allergies and congestion versus concern for possible UTI given her complaint.  Also concern for anemia.  Labs and imaging are pending.  Lower suspicion at this time for cardiac etiology.  3:40 PM I independently interpreted patient's labs and EKG.  Patient's CBC with a  leukocytosis today with a white count of 19 from 15, hemoglobin is unchanged at 11, CMP without acute findings, UA without evidence of infection today with no white cells or bacteria seen, VBG is reassuring, troponin is negative and viral panel is negative.  I have independently visualized and interpreted pt's images today.  Chest x-ray with evidence of a large mass in the right middle lobe.  CTA per radiology shows a cutoff of the proximal segment of the pulmonary artery branch to the posterior segment of the right upper lobe which is similar to prior due to the tumor in the right upper lobe however there is also very small volume nonocclusive thrombus in the proximal subsegmental artery but is of indeterminate age but was not present on prior scan and remaining pulmonary branches are patent.  Also large mass still present with concern for postobstructive pneumonia.  Discussed these findings with Dr. Francine Graven patient's pulmonologist and at this time do not feel that she is a candidate for anticoagulants due to her intermittent hemoptysis.  He recommended Augmentin for the pneumonia.  Discussed with the patient and she has an allergy to Augmentin but reports she can have amoxicillin.  Will give 5 days of amoxicillin.  Did discuss the blood clot and mass with her.  Patient is very firm that she does not want cancer treatment and she is seeing palliative care and is not interested in radiation and chemotherapy at this time.  At this time feel that patient can be discharged home.  She is comfortable with this plan and  given return precautions          Final Clinical Impression(s) / ED Diagnoses Final diagnoses:  Hemoptysis  Small cell lung cancer in adult [facility])  Pneumonia of right middle lobe due to infectious organism    Rx / DC Orders ED Discharge Orders     None         Gwyneth Sprout, MD 01/31/24 [phone]

## 2024-01-31 NOTE — Telephone Encounter (Addendum)
 Call from [employer] office, spoke to [place], office reporting pt experiencing coughing up blood. Some appt availability today. Speaking to pt.  TRIAGE SUMMARY NOTE: Pt reporting that she was recently seen by Dr. Francine Graven on 3/19, reporting that her coughing has increased since then, also noticed this morning that she is coughing up "half a teaspoon" of blood," pt is moderately more SOB than normal, feeling especially "winded" when up and moving, had lung pain past couple days since appt, pt thinks overexerted for pulm exam. Pt confirms no chest/lung pain today. Pt unsure if she should start new inhaler Advair today since "lungs are bleeding" - please advise. Advised pt be examined asap, pt refusing ED at first, connected to [employer] front desk but pt opting for ED over available appts due to transportation issues and need for in-person exam. Please advise if further recommendations.  E2C2 Pulmonary Triage - Initial Assessment Questions "Chief Complaint (e.g., cough, sob, wheezing, fever, chills, sweat or additional symptoms) *Go to specific symptom protocol after initial questions. Coughing up blood, not sure how long, coughing has increased, first time loose enough and check it, think just started, increased coughing since saw him, was on steroids and got off think wasn't ready, just recently suggested increase it and/or antibx, thinking infection since intensity of chills were having, expecting advair to come in this morning, don't know if can take that if lungs bleeding, just spent lot of time in hospital with steroids and antibx Met Dr. Francine Graven for coughing up blood after dry coughing for months Hx RA, stage 3A small cell lung cancer Still dry coughing a lot more since decreased it, have enough at home to increase prednisone More SOB than usual For appt was doing deep breaths for exam then lungs were killing me all that day and yesterday, were starting to feel better today, was supposed  to start inhaler for first time in 30 years today Seemed like a lot in my tissue this morning but haven't coughed since I did that, haven't moved since so no exertion so no more coughing, will likely happen once up and moving Really okay at home, quick to ED if needed  "How long have symptoms been present?" Just recently only noticed it this morning  "Have you used your inhalers/maintenance medication?" Yes If yes, "What medications?" Prednisone 5 mg in morning and 2.5 mg in afternoon, was concerned about coughing up blood yesterday, so took 5 in morning, 2.5 lunch and 2.5 afternoon  OXYGEN: "Do you wear supplemental oxygen?" No  "Do you monitor your oxygen levels?" No  Reason for Disposition  [1] MODERATE difficulty breathing (e.g., speaks in phrases, SOB even at rest, pulse 100-120) AND [2] NEW-onset or WORSE than normal  Answer Assessment - Initial Assessment Questions 3. SPUTUM: "Describe the color of your sputum" (none, dry cough; clear, white, yellow, green)     Dry cough usually and been more mucousy past 2 days 4. HEMOPTYSIS: "How much blood?" (flecks, streaks, tablespoons, etc.)     Maybe half a teaspoon, never cough up any amount of blood normally, no clots 5. DIFFICULTY BREATHING: "Are you having difficulty breathing?" If Yes, ask: "How bad is it?" (e.g., mild, moderate, severe)    - MILD: No SOB at rest, mild SOB with walking, speaks normally in sentences, can lie down, no retractions, pulse < 100.    - MODERATE: SOB at rest, SOB with minimal exertion and prefers to sit, cannot lie down flat, speaks in phrases, mild retractions,  audible wheezing, pulse 100-120.    - SEVERE: Very SOB at rest, speaks in single words, struggling to breathe, sitting hunched forward, retractions, pulse > 120      Moderate, since started taper off prednisone, lungs actually hurt, never hurt really before, both lungs hurting when try to take breath, think overdid it when was checking them 6. FEVER:  "Do you have a fever?" If Yes, ask: "What is your temperature, how was it measured, and when did it start?"     no 7. CARDIAC HISTORY: "Do you have any history of heart disease?" (e.g., heart attack, congestive heart failure)      HTN 10. OTHER SYMPTOMS: "Do you have any other symptoms?" (e.g., runny nose, wheezing, chest pain)       No chills, had wheezing last night then went away, lung pain constant 6/10 first day after saw him, next day more sore constant 4/10, today doesn't hurt but coughing up blood, no weakness right now, no chest pain  Protocols used: Coughing Up Blood-A-AH, Cancer - Breathing Difficulty-A-AH

## 2024-01-31 NOTE — Discharge Instructions (Signed)
 Continue your plan of follow-up with palliative care.  It would not be unusual for you to cough up blood intermittently due to the mass.  If you start coughing up tablespoon after tablespoon of blood consistently you should return to the emergency room.  You do have a postobstructive pneumonia today and because of your allergies we will do amoxicillin for the next 5 days.  It is also okay for you to start your Advair.  Make sure you are not overdoing it and pushing yourself more than what you can handle.

## 2024-01-31 NOTE — ED Triage Notes (Signed)
 Patient presented to ER for coughing up blood, patient was recently diagnosed with non-small cell carcinoma, non operable. Patient was on steroids for coughing up blood, but has been tapering them down, patient now coughing up bright red blood again. Endorses fatigue, fever/chills/cloudy urine for 2-3 days.

## 2024-02-03 ENCOUNTER — Encounter: Payer: Self-pay | Admitting: Nurse Practitioner

## 2024-02-03 ENCOUNTER — Telehealth: Payer: Self-pay | Admitting: Pulmonary Disease

## 2024-02-03 ENCOUNTER — Inpatient Hospital Stay: Attending: Internal Medicine | Admitting: Nurse Practitioner

## 2024-02-03 VITALS — BP 130/84 | HR 81 | Temp 98.6°F | Resp 15 | Wt 130.6 lb

## 2024-02-03 DIAGNOSIS — G4700 Insomnia, unspecified: Secondary | ICD-10-CM

## 2024-02-03 DIAGNOSIS — G893 Neoplasm related pain (acute) (chronic): Secondary | ICD-10-CM | POA: Diagnosis not present

## 2024-02-03 DIAGNOSIS — R630 Anorexia: Secondary | ICD-10-CM | POA: Diagnosis not present

## 2024-02-03 DIAGNOSIS — R530 Neoplastic (malignant) related fatigue: Secondary | ICD-10-CM

## 2024-02-03 DIAGNOSIS — C3411 Malignant neoplasm of upper lobe, right bronchus or lung: Secondary | ICD-10-CM

## 2024-02-03 DIAGNOSIS — Z515 Encounter for palliative care: Secondary | ICD-10-CM | POA: Diagnosis not present

## 2024-02-03 DIAGNOSIS — Z7189 Other specified counseling: Secondary | ICD-10-CM

## 2024-02-03 DIAGNOSIS — J181 Lobar pneumonia, unspecified organism: Secondary | ICD-10-CM

## 2024-02-03 DIAGNOSIS — R110 Nausea: Secondary | ICD-10-CM

## 2024-02-03 NOTE — Progress Notes (Signed)
 Palliative Medicine [facility]  Telephone:([phone] Fax:([phone]) [phone]   Name: Mary Erickson Date: 02/03/2024 MRN: [id number]  DOB: 01-18-1966  Patient Care Team: Filomena Jungling, NP as PCP - General (Nurse Practitioner)    REASON FOR CONSULTATION: Mary Erickson is a 58 y.o. female with oncologic medical history including non-small cell lung cancer, and multiple other co-morbidities as documented below.  Palliative is seeing patient for symptom management and goals of care.    SOCIAL HISTORY:     reports that she quit smoking about 17 years ago. Her smoking use included cigarettes. She started smoking about 41 years ago. She has a 36 pack-year smoking history. She has never used smokeless tobacco. She reports that she does not currently use drugs after having used the following drugs: Marijuana. She reports that she does not drink alcohol.  ADVANCE DIRECTIVES:  On file   CODE STATUS: DNR  PAST MEDICAL HISTORY: Past Medical History:  Diagnosis Date   Acid reflux    ADHD (attention deficit hyperactivity disorder)    Anemia    Arthritis    "elbows, hands, neck, shoulders" (10/02/2016)   Calcium blood increased    Chronic neck pain    Chronic UTI (urinary tract infection)    "from self caths" (10/02/2016)   Hashimoto's disease    Hepatitis B    acute hepatitis B from lancet   History of alcoholism (HCC)    10/02/2016 "sober since 03/14/2005"   History of blood transfusion 1984; 1989; 2008   "quite a few"   History of shortness of breath    Hypercholesteremia    Hypothyroidism    no meds   Internal carotid aneurysm dx'd early 2017   Legally blind in left eye, as defined in [place]    Melanoma [facility])    left upper arm   Migraines    "quite a few in a month; at least 15" (10/02/2016)   Neuropathy    neck; "not sure if it's from DM or Sjogren's" (10/02/2016)   Pneumonia 1986   "1 yr after major OR when I was under anesthesia for 13 hr"    Seizures (HCC) ~ 2004; ~ 2013   "seizures from diabetes "   Self-catheterizes urinary bladder    "since OR in 1984" (10/02/2016)   Sjogren's syndrome (HCC)    Thyroid goiter    "still present" (10/02/2016)   Type II diabetes mellitus (HCC)    Urinary retention    self caths   Wears glasses     PAST SURGICAL HISTORY:  Past Surgical History:  Procedure Laterality Date   ABDOMINAL HYSTERECTOMY  2008   BILATERAL CARPAL TUNNEL RELEASE Bilateral    BRONCHIAL WASHINGS  12/10/2023   Procedure: BRONCHIAL WASHINGS;  Surgeon: Martina Sinner, MD;  Location: [facility];  Service: Pulmonary;;   ECTOPIC PREGNANCY SURGERY  1989   ENDOBRONCHIAL ULTRASOUND N/A 12/10/2023   Procedure: ENDOBRONCHIAL ULTRASOUND;  Surgeon: Martina Sinner, MD;  Location: [facility];  Service: Pulmonary;  Laterality: N/A;   EXCISION MELANOMA WITH SENTINEL LYMPH NODE BIOPSY Left 10/02/2016   Procedure: WIDE LOCAL EXCISION AND ADVANCEMENT FLAP CLOSURE LEFT UPPER  MELANOMA WITH SENTINEL LYMPH NODE MAPPING AND BIOPSY;  Surgeon: Almond Lint, MD;  Location: [facility];  Service: General;  Laterality: Left;   EYE SURGERY     FINE NEEDLE ASPIRATION  12/10/2023   Procedure: FINE NEEDLE ASPIRATION (FNA) LINEAR;  Surgeon: Martina Sinner, MD;  Location: [facility];  Service: Pulmonary;;   LAPAROSCOPIC CHOLECYSTECTOMY  ~ 01/2016   MELANOMA EXCISION WITH SENTINEL LYMPH NODE BIOPSY Left 10/02/2016   WIDE LOCAL EXCISION AND ADVANCEMENT FLAP CLOSURE LEFT UPPER  MELANOMA WITH SENTINEL LYMPH NODE MAPPING AND BIOPSY   OVARIAN CYST REMOVAL     SHOULDER ARTHROSCOPY W/ ROTATOR CUFF REPAIR Right 2014   "partial"   STRABISMUS SURGERY Left 1971   STRABISMUS SURGERY Left 03/18/2020   Procedure: STRABISMUS REPAIR;  Surgeon: Verne Carrow, MD;  Location: [facility];  Service: Ophthalmology;  Laterality: Left;   THYROIDECTOMY N/A 03/01/2023   Procedure: TOTAL THYROIDECTOMY;  Surgeon: Darnell Level, MD;  Location: [facility];   Service: General;  Laterality: N/A;   TUMOR EXCISION  1984   from tail bone and part of tail bone removed   VIDEO BRONCHOSCOPY N/A 12/10/2023   Procedure: VIDEO BRONCHOSCOPY WITHOUT FLUORO;  Surgeon: Martina Sinner, MD;  Location: [facility];  Service: Pulmonary;  Laterality: N/A;    HEMATOLOGY/ONCOLOGY HISTORY:  Oncology History  Adenocarcinoma of upper lobe of right lung (HCC)  12/25/2023 Initial Diagnosis   Adenocarcinoma of upper lobe of right lung (HCC)   12/25/2023 Cancer Staging   Staging form: Lung, AJCC V9 - Clinical: Stage IIIA (cT4, cN1, cM0) - Signed by Si Gaul, MD on 12/25/2023 Method of lymph node assessment: Clinical     ALLERGIES:  is allergic to doxycycline, aspirin, ciprofloxacin, nsaids, amoxicillin-pot clavulanate, gluten meal, lactose, codeine, hydrocodone-acetaminophen, oxycodone, sulfa antibiotics, and sulfasalazine.  MEDICATIONS:  Current Outpatient Medications  Medication Sig Dispense Refill   acetaminophen (TYLENOL) 500 MG tablet Take 1,000 mg by mouth every 6 (six) hours as needed for moderate pain.     aluminum-magnesium hydroxide 200-200 MG/5ML suspension Take by mouth every 6 (six) hours as needed for indigestion.     amoxicillin (AMOXIL) 500 MG capsule Take 1 capsule (500 mg total) by mouth 3 (three) times daily. 15 capsule 0   benzocaine (ORAJEL) 10 % mucosal gel Use as directed 1 Application in the mouth or throat as needed for mouth pain.     Cholecalciferol (VITAMIN D3) 125 MCG (5000 UT) capsule Take 5,000 Units by mouth daily.     Continuous Glucose Sensor (DEXCOM G7 SENSOR) MISC Inject 1 applicator into the skin See admin instructions. Every 10 days     Cyanocobalamin (B-12) 3000 MCG CAPS Take 3,000 mcg by mouth daily.     diclofenac Sodium (VOLTAREN) 1 % GEL Apply 2 g topically 4 (four) times daily. (Patient taking differently: Apply 2 g topically 4 (four) times daily as needed (pain).) 50 g 0   diphenhydrAMINE HCl (BENADRYL ALLERGY  PO) Take 1 tablet by mouth at bedtime.     fluconazole (DIFLUCAN) 150 MG tablet Take 150 mg by mouth See admin instructions. Take 150 mg every 3 days for 3 doses starting on the 15th of every month     fluticasone-salmeterol (ADVAIR HFA) 115-21 MCG/ACT inhaler Inhale 2 puffs into the lungs 2 (two) times daily. 12 g 12   glucose blood (ACCU-CHEK GUIDE) test strip 1 each by Other route as needed.     ibuprofen (ADVIL) 400 MG tablet Take 400 mg by mouth every 6 (six) hours as needed for moderate pain (pain score 4-6).     insulin detemir (LEVEMIR FLEXTOUCH) 100 UNIT/ML FlexPen Inject 4 Units into the skin 2 (two) times daily.     insulin detemir (LEVEMIR) 100 UNIT/ML FlexPen Inject 16 Units into the skin daily for 6 days.  Insulin lispro (HUMALOG JUNIOR KWIKPEN) 100 UNIT/ML Inject 1-7 Units into the skin as needed (high blood sugar).     Ketotifen Fumarate ([employer] ALLERGY EYE DROPS OP) Place 1 drop into both eyes 2 (two) times daily.     levothyroxine (SYNTHROID) 112 MCG tablet Take 112 mcg by mouth daily before breakfast.     linaclotide (LINZESS) 145 MCG CAPS capsule Take 1 capsule (145 mcg total) by mouth daily before breakfast. 30 capsule 3   Magnesium 400 MG CAPS Take 400 mg by mouth daily.     Melatonin 10-10 MG TBCR Take 10 mg by mouth at bedtime.     methylphenidate 36 MG PO CR tablet Take 36 mg by mouth every morning.     Multiple Vitamins-Minerals (MULTIVITAMINS THER. W/MINERALS) TABS Take 1 tablet by mouth daily.     nystatin cream (MYCOSTATIN) Apply 1 Application topically 2 (two) times daily.     omeprazole (PRILOSEC) 20 MG capsule Take 1 capsule (20 mg total) by mouth 2 (two) times daily before a meal. 60 capsule 1   ondansetron (ZOFRAN ODT) 4 MG disintegrating tablet Take 1 tablet (4 mg total) by mouth every 6 (six) hours as needed for nausea or vomiting. 30 tablet 1   Polyethyl Glycol-Propyl Glycol (SYSTANE OP) Place 1 drop into both eyes 3 (three) times daily as needed (dry eyes).      polyethylene glycol powder (GLYCOLAX/MIRALAX) 17 GM/SCOOP powder Take 17 g by mouth daily as needed for moderate constipation.     predniSONE (DELTASONE) 10 MG tablet Takes 6 tablets for 2 days, then 5 tablets for 2 days, then 4 tablets for 2 days, then 3 tablets for 2 days, then 2 tabs for 2 days, then 1 tab for 2 days, and then stop. 42 tablet 0   predniSONE (DELTASONE) 5 MG tablet Take 1 tablet (5 mg total) by mouth 2 (two) times daily.     No current facility-administered medications for this visit.    VITAL SIGNS: BP 130/84 (BP Location: Left Arm, Patient Position: Sitting)   Pulse 81   Temp 98.6 F (37 C) (Temporal)   Resp 15   Wt 130 lb 9.6 oz (59.2 kg)   SpO2 100%   BMI 19.86 kg/m  Filed Weights   02/03/24 1320  Weight: 130 lb 9.6 oz (59.2 kg)    Estimated body mass index is 19.86 kg/m as calculated from the following:   Height as of 01/31/24: 5\' 8"  (1.727 m).   Weight as of this encounter: 130 lb 9.6 oz (59.2 kg).  LABS: CBC:    Component Value Date/Time   WBC 19.1 (H) 01/31/2024 1128   HGB 11.1 (L) 01/31/2024 1128   HGB 11.8 (L) 12/25/2023 1344   HCT 36.4 01/31/2024 1128   PLT 424 (H) 01/31/2024 1128   PLT 430 (H) 12/25/2023 1344   MCV 92.9 01/31/2024 1128   NEUTROABS 17.4 (H) 01/31/2024 1128   LYMPHSABS 0.9 01/31/2024 1128   MONOABS 0.5 01/31/2024 1128   EOSABS 0.0 01/31/2024 1128   BASOSABS 0.1 01/31/2024 1128   Comprehensive Metabolic Panel:    Component Value Date/Time   NA 133 (L) 01/31/2024 1128   K 4.0 01/31/2024 1128   CL 99 01/31/2024 1128   CO2 23 01/31/2024 1128   BUN 9 01/31/2024 1128   CREATININE 0.68 01/31/2024 1128   CREATININE 0.80 12/25/2023 1344   GLUCOSE 114 (H) 01/31/2024 1128   CALCIUM 9.0 01/31/2024 1128   AST 34 01/31/2024 1128  AST 25 12/25/2023 1344   ALT 25 01/31/2024 1128   ALT 15 12/25/2023 1344   ALKPHOS 146 (H) 01/31/2024 1128   BILITOT 0.2 01/31/2024 1128   BILITOT 0.4 12/25/2023 1344   PROT 7.9 01/31/2024 1128    ALBUMIN 3.4 (L) 01/31/2024 1128    RADIOGRAPHIC STUDIES: CT Angio Chest PE W and/or Wo Contrast Result Date: 01/31/2024 CLINICAL DATA:  Hemoptysis. Recent diagnosis of non-small cell lung carcinoma. EXAM: CT ANGIOGRAPHY CHEST WITH CONTRAST TECHNIQUE: Multidetector CT imaging of the chest was performed using the standard protocol during bolus administration of intravenous contrast. Multiplanar CT image reconstructions and MIPs were obtained to evaluate the vascular anatomy. RADIATION DOSE REDUCTION: This exam was performed according to the departmental dose-optimization program which includes automated exposure control, adjustment of the mA and/or kV according to patient size and/or use of iterative reconstruction technique. CONTRAST:  75mL OMNIPAQUE IOHEXOL 350 MG/ML SOLN COMPARISON:  CT angiography chest from 12/08/2023. FINDINGS: Cardiovascular: There is abrupt cut off of the proximal segmental pulmonary artery branch to posterior segment of right upper lobe, which is similar to the prior study. However, there is very small volume nonocclusive thrombus in the proximal subsegmental pulmonary artery branch to apical segment of right upper lobe (series 4, images 40 8-49), new since the prior study but otherwise of indeterminate age. Remaining pulmonary artery branches up to the proximal subsegmental level are patent. No right heart strain or lung infarction. Mild cardiomegaly. No pericardial effusion. No aortic aneurysm. Mediastinum/Nodes: Visualized thyroid gland appears grossly unremarkable. No solid / cystic mediastinal masses. The esophagus is nondistended precluding optimal assessment. No axillary, mediastinal or hilar lymphadenopathy by size criteria. Lungs/Pleura: The central tracheo-bronchial tree is patent. Redemonstration of large approximately 10.1 x 10.8 cm mass centered in the posterior segment of right upper lobe with extension into the superior segment of right lower lobe. There is smooth  interlobular septal thickening, ground-glass opacities and few additional opacities in the apical segment of right upper lobe, which is likely due to post obstructive pneumonia. There are patchy opacities throughout bilateral lungs, nonspecific. Upper Abdomen: Surgically absent gallbladder. Visualized upper abdominal viscera within normal limits. Musculoskeletal: The visualized soft tissues of the chest wall are grossly unremarkable. No suspicious osseous lesions. There are mild multilevel degenerative changes in the visualized spine. Review of the MIP images confirms the above findings. IMPRESSION: 1. There is abrupt cut off of the proximal segmental pulmonary artery branch to posterior segment of right upper lobe, which is similar to the prior study and due to tumor in the right upper lobe. However, there is very small volume nonocclusive thrombus in the proximal subsegmental pulmonary artery branch to apical segment of right upper lobe, new since the prior study but otherwise of indeterminate age. Remaining pulmonary artery branches up to the proximal subsegmental level are patent. No right heart strain or lung infarction. 2. Redemonstration of large approximately 10.1 x 10.8 cm mass centered in the posterior segment of right upper lobe with extension into the superior segment of right lower lobe. There is smooth interlobular septal thickening, ground-glass opacities and few additional opacities in the apical segment of right upper lobe, which is likely due to post obstructive pneumonia. 3. Multiple other nonacute observations, as described above. Electronically Signed   By: Jules Schick M.D.   On: 01/31/2024 14:50   DG Chest Port 1 View Result Date: 01/31/2024 CLINICAL DATA:  Hemoptysis. EXAM: PORTABLE CHEST 1 VIEW COMPARISON:  12/10/2023. FINDINGS: Redemonstration of an approximately 11 x 11  cm opacity overlying the right upper mid lung zones without volume loss, compatible with patient's known malignancy.  No significant interval change. Bilateral lung fields are otherwise clear. Bilateral costophrenic angles are clear. Normal cardio-mediastinal silhouette. No acute osseous abnormalities. The soft tissues are within normal limits. IMPRESSION: *No acute cardiopulmonary abnormality. *Redemonstration of an approximately 11 x 11 cm opacity overlying the right upper mid lung zones without volume loss, compatible with patient's known malignancy. No significant interval change. Electronically Signed   By: Jules Schick M.D.   On: 01/31/2024 14:39   PERFORMANCE STATUS (ECOG) : 1 - Symptomatic but completely ambulatory  Review of Systems  Constitutional:  Positive for activity change and fatigue.  Respiratory:  Positive for chest tightness and shortness of breath.   Unless otherwise noted, a complete review of systems is negative.  Physical Exam General: NAD Cardiovascular: regular rate and rhythm Pulmonary: normal breathing pattern  Extremities: no edema, no joint deformities Skin: no rashes Neurological: Alert and oriented x3  IMPRESSION: Discussed the use of AI scribe software for clinical note transcription with the patient, who gave verbal consent to proceed. History of Present Illness MEIKO IVES is a 58 year old female with lung cancer who presents for palliative care consultation. She is accompanied by her neighbor, Leta Jungling. Initially seen by our palliative team during recent hospitalization. Ambulatory without assistance. Alert and able to engage appropriately in discussions.   I introduced myself, Maygan RN, and Palliative's role in collaboration with the oncology team. Concept of Palliative Care was introduced as specialized medical care for people and their families living with serious illness.  It focuses on providing relief from the symptoms and stress of a serious illness.  The goal is to improve quality of life for both the patient and the family. Values and goals of care important  to patient and family were attempted to be elicited.   Ms. Cayabyab lives in the home alone. No children. 3 brothers. Great support from her neighbor. She has been disabled most of her life due to multiple autoimmune disorders. She is able to perform all ADLs independently with limitations at time due to fatigue and dyspnea. She has a history of diabetes and has been on disability due to chronic health issues, including recurrent UTIs and the need for self-catheterization since a surgery at age 77.   She reports frequent nausea, which she manages with Zofran. Nausea significantly impacts her energy levels, causing severe fatigue.  She experiences significant fatigue and difficulty breathing despite normal oxygen saturation levels. She attributes this to the tumor's obstruction, which makes it challenging to take deep breaths.We discussed etiology and pathophysiology of her symptoms. She is currently on a steroid and an inhaler managed by her Pulmonolgist. She is concerned about the potential for these medications to cause anxiety and disrupt her sleep, which she is very protective of. She has not yet experienced sleep disturbances but is vigilant about the possibility.  History provided on the use of sleep aids as well as antianxiety medication when needed.  Patient is hopeful she does not have to take but also expresses awareness that she may get to a point where this is required.  States currently she is taking Benadryl at bedtime to help her sleep.  Ms. Micallef experiences episodes of hemoptysis and persistent dull pain in the area of the tumor, which intensifies with gastrointestinal pressure. Certain medications, including a recent antibiotic, have exacerbated her symptoms, causing significant gastrointestinal discomfort and chest wall pain/pressure.  She has been managing her pain with NSAIDs for over 20 years due to multiple autoimmune diseases. She is concerned about the gastrointestinal side  effects and potential for stomach bleeding. She inquires about alternative pain management options, particularly those that target pain locally without exacerbating her gastrointestinal issues. I did express typically radiation could be used for pain management with a goal of shrinking tumor size with understanding patient is not interesting in such therapies. Education provided at length on different medications that can be utilized to assist with her pain specifically opioids.  Patient states she is not interested at this time however again acknowledges that in the future as her health declines and symptom burden becomes high she may have to consider..  Her main concern is sedation in addition to GI upset.  I explained many of these medications can be taken in lower doses and with proper bowel regimen constipation could be prevented. She current bowel regimen consist of Linzess and MiraLAX.  Goals of care We discussed her current illness and what it means in the larger context of her on-going co-morbidities. Natural disease trajectory and expectations were discussed.  Loyal is realistic in her understanding of her cancer and overall prognosis.  She is clear in her expressed wishes no desire to pursue further workup or oncological interventions.  At this time she wishes to focus solely on her quality of life while taking things 1 day at a time managing her symptoms.  Education provided on the goals of philosophies of care centered around hospice and palliative.  She verbalized understanding expressing her previous experience with her father who is under hospice care at end-of-life.  She is aware this will be most appropriate for her in the near future however at this time she desires to continue to take things day by day making decisions as necessary.   We empathetically expect discussed healthcare limitations.  Ms. Smarr confirms her wishes for DNR/DNI.  During her previous hospitalization my  colleague assisted with completion of MOST form.  Patient's documented advanced directive is on file identifying her stepmother Darel Hong as her Museum/gallery exhibitions officer who lives in [place], [place].  Patient does not desire any artificial feedings or forms of life-sustaining measures.  She initially documented she was okay with a 3-day trial of intubation however after discussions understanding her overall cancer prognosis she agrees that this is not ideal expressing wishes for complete DNR/DNI no intubation.  We discussed updating her documents to identify wishes.  She is requesting to update at her follow-up visit.  I discussed the importance of continued conversation with family and their medical providers regarding overall plan of care and treatment options, ensuring decisions are within the context of the patients values and GOCs. Assessment & Plan Established therapeutic relationship. Education provided on palliative's role in collaboration with their Oncology/Radiation team.  Lung cancer Lung tumor causing hemoptysis, pain, and dyspnea. Concerns about steroid side effects and opioid use. - Continue steroids and Advair per pulmonology. - Monitor for anxiety and sleep disturbances from steroids. - Consider tramadol, oxycodone or morphine if NSAIDs are insufficient for pain. - Educate on home oxygen saturation monitoring.  Pain management Pain from lung tumor exacerbated by gastrointestinal issues. Prefers non-opioid management but open to low-dose opioids for quality of life. - Continue NSAIDs as tolerated. - Consider low-dose opioids if pain is unmanageable with NSAIDs. - Educate on opioid side effects, including constipation and nausea, and the importance of taking with food.  Nausea Debilitating nausea managed  with Zofran, concerns about QTc prolongation. Compazine discussed as alternative. - Consider Compazine as an alternative or adjunct to Zofran. - Educate on Zofran side effects,  including constipation and QTc prolongation.  Goals of Care Advanced directives completed, prefers quality of life over aggressive interventions, open to hospice care in the future. - Review and update advanced directives as needed. - Discuss transition to hospice care when symptom burden increases. -Patient is clear that she does not desire any cancer workup or treatments. -DNR/DNI, no artificial feeding/PEG -Will plan to update MOST form at follow-up visit.  Follow-up Flexible follow-up schedule based on condition, encouraged to communicate changes. - Schedule follow-up in six weeks. - Encourage use of MyChart for communication and updates.  Patient expressed understanding and was in agreement with this plan. She also understands that She can call the clinic at any time with any questions, concerns, or complaints.   Thank you for your referral and allowing Palliative to assist in Ms. Devone R Prunty's care.   Number and complexity of problems addressed: HIGH - 1 or more chronic illnesses with SEVERE exacerbation, progression, or side effects of treatment - advanced cancer, pain. Any controlled substances utilized were prescribed in the context of palliative care.  Visit consisted of counseling and education dealing with the complex and emotionally intense issues of symptom management and palliative care in the setting of serious and potentially life-threatening illness.  Signed by: Willette Alma, AGPCNP-BC Palliative Medicine Team/[facility]

## 2024-02-03 NOTE — Telephone Encounter (Signed)
 Patient would like Cipro called into pharmacy. Pharmacy is [facility]. Patient phone number is [phone].

## 2024-02-04 NOTE — Telephone Encounter (Signed)
 ATC X1. LMTCB

## 2024-02-04 NOTE — Telephone Encounter (Signed)
 I called and spoke to pt. Pt states she has not taken amoxicillin in a while. Pt states Cipro is better b/c it does not effect her liver like amoxicillin does. Pt became sick and nauseated with Amoxicillin and had insulin resistance. When she stopped amoxicillin, her BP and symptoms became better. Pt states she was rx this for pneumonia. Pt would like to know if Cipro would be rx to her, instead of taking amoxicillin?  Preferred pharmacy is [facility]

## 2024-02-05 MED ORDER — CIPROFLOXACIN HCL 500 MG PO TABS
500.0000 mg | ORAL_TABLET | Freq: Two times a day (BID) | ORAL | 0 refills | Status: DC
Start: 2024-02-05 — End: 2024-02-10

## 2024-02-05 NOTE — Telephone Encounter (Signed)
 Ciprofloxacin 1 tab twice daily for 10 days sent to pharmacy

## 2024-02-05 NOTE — Telephone Encounter (Signed)
 Spoke with Kayle regarding Dr. Cindi Carbon previous note. Pt verbalized understanding and had no concerns. NFN

## 2024-02-10 ENCOUNTER — Telehealth: Payer: Self-pay

## 2024-02-10 DIAGNOSIS — J181 Lobar pneumonia, unspecified organism: Secondary | ICD-10-CM

## 2024-02-10 MED ORDER — CIPROFLOXACIN HCL 500 MG PO TABS: 500.0000 mg | ORAL_TABLET | Freq: Two times a day (BID) | ORAL | 0 refills | Status: DC

## 2024-02-10 NOTE — Telephone Encounter (Signed)
 Copied from CRM [id number]. Topic: Clinical - Prescription Issue >> Feb 07, 2024  1:43 PM Renie Ora wrote: Reason for CRM: Patient is calling in regarding her medication ciprofloxacin (CIPRO) 500 MG tablet, she stated she received a phone call twice regarding the medication being ready however her pharmacy which is confirmed [id number] - [place], [place] - [address] has not received the medication.

## 2024-03-19 ENCOUNTER — Encounter: Payer: Self-pay | Admitting: Nurse Practitioner

## 2024-03-19 ENCOUNTER — Inpatient Hospital Stay: Attending: Internal Medicine | Admitting: Nurse Practitioner

## 2024-03-19 VITALS — BP 135/67 | HR 83 | Temp 98.0°F | Resp 16 | Wt 129.5 lb

## 2024-03-19 DIAGNOSIS — R110 Nausea: Secondary | ICD-10-CM

## 2024-03-19 DIAGNOSIS — R062 Wheezing: Secondary | ICD-10-CM

## 2024-03-19 DIAGNOSIS — Z515 Encounter for palliative care: Secondary | ICD-10-CM

## 2024-03-19 DIAGNOSIS — R530 Neoplastic (malignant) related fatigue: Secondary | ICD-10-CM

## 2024-03-19 DIAGNOSIS — R634 Abnormal weight loss: Secondary | ICD-10-CM

## 2024-03-19 DIAGNOSIS — Z7189 Other specified counseling: Secondary | ICD-10-CM | POA: Diagnosis not present

## 2024-03-19 DIAGNOSIS — C3411 Malignant neoplasm of upper lobe, right bronchus or lung: Secondary | ICD-10-CM

## 2024-03-19 NOTE — Progress Notes (Signed)
 Palliative Medicine [facility]  [phone] [phone]   Name: Mary Erickson Date: 03/19/2024 MRN: [id number]  DOB: 03/24/1966  Patient Care Team: Nita Bast, NP as PCP - General (Nurse Practitioner) Bennet Brasil, Giles Labrum, NP as Nurse Practitioner (Hospice and Palliative Medicine)    INTERVAL HISTORY: Mary Erickson is a 58 y.o. female with oncologic medical history including non-small cell lung cancer, and multiple other co-morbidities as documented below.  Palliative is seeing patient for symptom management and goals of care.   SOCIAL HISTORY:     reports that she quit smoking about 17 years ago. Her smoking use included cigarettes. She started smoking about 41 years ago. She has a 36 pack-year smoking history. She has never used smokeless tobacco. She reports that she does not currently use drugs after having used the following drugs: Marijuana. She reports that she does not drink alcohol.  ADVANCE DIRECTIVES:  On file (DNR, AD, MOST)   CODE STATUS: DNR  PAST MEDICAL HISTORY: Past Medical History:  Diagnosis Date   Acid reflux    ADHD (attention deficit hyperactivity disorder)    Anemia    Arthritis    "elbows, hands, neck, shoulders" (10/02/2016)   Calcium  blood increased    Chronic neck pain    Chronic UTI (urinary tract infection)    "from self caths" (10/02/2016)   Hashimoto's disease    Hepatitis B    acute hepatitis B from lancet   History of alcoholism (HCC)    10/02/2016 "sober since 03/14/2005"   History of blood transfusion 1984; 1989; 2008   "quite a few"   History of shortness of breath    Hypercholesteremia    Hypothyroidism    no meds   Internal carotid aneurysm dx'd early 2017   Legally blind in left eye, as defined in [place]     Melanoma (HCC)    left upper arm   Migraines    "quite a few in a month; at least 15" (10/02/2016)   Neuropathy    neck; "not sure if it's from DM or Sjogren's"  (10/02/2016)   Pneumonia 1986   "1 yr after major OR when I was under anesthesia for 13 hr"   Seizures (HCC) ~ 2004; ~ 2013   "seizures from diabetes "   Self-catheterizes urinary bladder    "since OR in 1984" (10/02/2016)   Sjogren's syndrome (HCC)    Thyroid  goiter    "still present" (10/02/2016)   Type II diabetes mellitus (HCC)    Urinary retention    self caths   Wears glasses     ALLERGIES:  is allergic to doxycycline, aspirin , ciprofloxacin , nsaids, amoxicillin -pot clavulanate, gluten meal, lactose, codeine, hydrocodone -acetaminophen , oxycodone , sulfa antibiotics, and sulfasalazine.  MEDICATIONS:  Current Outpatient Medications  Medication Sig Dispense Refill   acetaminophen  (TYLENOL ) 500 MG tablet Take 1,000 mg by mouth every 6 (six) hours as needed for moderate pain.     aluminum-magnesium  hydroxide 200-200 MG/5ML suspension Take by mouth every 6 (six) hours as needed for indigestion.     amoxicillin  (AMOXIL ) 500 MG capsule Take 1 capsule (500 mg total) by mouth 3 (three) times daily. 15 capsule 0   benzocaine  (ORAJEL) 10 % mucosal gel Use as directed 1 Application in the mouth or throat as needed for mouth pain.     Cholecalciferol  (VITAMIN D3) 125 MCG (5000 UT) capsule Take 5,000 Units by mouth daily.     ciprofloxacin  (CIPRO ) 500 MG tablet Take 1 tablet (  500 mg total) by mouth 2 (two) times daily. 20 tablet 0   Continuous Glucose Sensor (DEXCOM G7 SENSOR) MISC Inject 1 applicator into the skin See admin instructions. Every 10 days     Cyanocobalamin  (B-12) 3000 MCG CAPS Take 3,000 mcg by mouth daily.     diclofenac  Sodium (VOLTAREN ) 1 % GEL Apply 2 g topically 4 (four) times daily. (Patient taking differently: Apply 2 g topically 4 (four) times daily as needed (pain).) 50 g 0   diphenhydrAMINE  HCl (BENADRYL  ALLERGY PO) Take 1 tablet by mouth at bedtime.     fluconazole  (DIFLUCAN ) 150 MG tablet Take 150 mg by mouth See admin instructions. Take 150 mg every 3 days for 3 doses  starting on the 15th of every month     fluticasone-salmeterol (ADVAIR  HFA) 115-21 MCG/ACT inhaler Inhale 2 puffs into the lungs 2 (two) times daily. 12 g 12   glucose blood (ACCU-CHEK GUIDE) test strip 1 each by Other route as needed.     ibuprofen  (ADVIL ) 400 MG tablet Take 400 mg by mouth every 6 (six) hours as needed for moderate pain (pain score 4-6).     insulin  detemir (LEVEMIR  FLEXTOUCH) 100 UNIT/ML FlexPen Inject 4 Units into the skin 2 (two) times daily.     insulin  detemir (LEVEMIR ) 100 UNIT/ML FlexPen Inject 16 Units into the skin daily for 6 days.     Insulin  lispro (HUMALOG JUNIOR KWIKPEN) 100 UNIT/ML Inject 1-7 Units into the skin as needed (high blood sugar).     Ketotifen  Fumarate ([employer] ALLERGY EYE DROPS OP) Place 1 drop into both eyes 2 (two) times daily.     levothyroxine  (SYNTHROID ) 112 MCG tablet Take 112 mcg by mouth daily before breakfast.     linaclotide  (LINZESS ) 145 MCG CAPS capsule Take 1 capsule (145 mcg total) by mouth daily before breakfast. 30 capsule 3   Magnesium  400 MG CAPS Take 400 mg by mouth daily.     Melatonin 10-10 MG TBCR Take 10 mg by mouth at bedtime.     methylphenidate  36 MG PO CR tablet Take 36 mg by mouth every morning.     Multiple Vitamins-Minerals (MULTIVITAMINS THER. W/MINERALS) TABS Take 1 tablet by mouth daily.     nystatin cream (MYCOSTATIN) Apply 1 Application topically 2 (two) times daily.     omeprazole  (PRILOSEC) 20 MG capsule Take 1 capsule (20 mg total) by mouth 2 (two) times daily before a meal. 60 capsule 1   ondansetron  (ZOFRAN  ODT) 4 MG disintegrating tablet Take 1 tablet (4 mg total) by mouth every 6 (six) hours as needed for nausea or vomiting. 30 tablet 1   Polyethyl Glycol-Propyl Glycol (SYSTANE OP) Place 1 drop into both eyes 3 (three) times daily as needed (dry eyes).     polyethylene glycol powder (GLYCOLAX /MIRALAX ) 17 GM/SCOOP powder Take 17 g by mouth daily as needed for moderate constipation.     predniSONE  (DELTASONE ) 10 MG  tablet Takes 6 tablets for 2 days, then 5 tablets for 2 days, then 4 tablets for 2 days, then 3 tablets for 2 days, then 2 tabs for 2 days, then 1 tab for 2 days, and then stop. 42 tablet 0   predniSONE  (DELTASONE ) 5 MG tablet Take 1 tablet (5 mg total) by mouth 2 (two) times daily.     No current facility-administered medications for this visit.    VITAL SIGNS: BP 135/67 (BP Location: Right Arm, Patient Position: Sitting)   Pulse 83   Temp 98 F (  36.7 C) (Temporal)   Resp 16   Wt 129 lb 8 oz (58.7 kg)   SpO2 97%   BMI 19.69 kg/m  Filed Weights   03/19/24 1243  Weight: 129 lb 8 oz (58.7 kg)    Estimated body mass index is 19.69 kg/m as calculated from the following:   Height as of 01/31/24: 5\' 8"  (1.727 m).   Weight as of this encounter: 129 lb 8 oz (58.7 kg).   PERFORMANCE STATUS (ECOG) : 1 - Symptomatic but completely ambulatory  Physical Exam General: NAD Cardiovascular: regular rate and rhythm Pulmonary: normal breathing pattern Extremities: no edema, no joint deformities Skin: no rashes Neurological: AAO x3  IMPRESSION: Discussed the use of AI scribe software for clinical note transcription with the patient, who gave verbal consent to proceed.  History of Present Illness Mary Erickson is a 58 year old female with non-small lung cancer who presents to clinic for symptom management follow-up. She is doing well overall.  Accompanied by her stepmother.  Denies pain or discomfort.  She experiences significant fatigue, which she describes as the 'biggest, most scariest thing.' She finds it difficult to perform even 15 minutes of exercise and feels exhausted afterward, impacting her ability to engage in both physical and mental activities. She has adjusted her routine to accommodate her energy levels, exercising only on days when she has no other plans.  We discussed at length listening to her body and giving herself grace allowing for rest breaks and or days of just  doing nothing to allow her body to recover.  She verbalized understanding.  Occasional nausea, which is exacerbated by fatty foods. She manages her nausea with Pepto and Zofran  .She also uses baking soda occasionally to help neutralize stomach acid.  She reports a history of constipation and diarrhea, which has improved with the use of supplements like Miralax  and stool softeners. She attributes some of her past issues to Zofran  use.  Mary Erickson experiences wheezing, which has improved compared to her previous symptom of hemoptysis. She uses an inhaler and takes prednisone  5 mg twice daily to manage her symptoms. She does not have a nebulizer at home. Her oxygen levels are currently stable. Currently, she uses Advair  to help with exercise-induced symptoms.  She has a constant wheeze despite use of inhaler. Education provided on use of nebulizer for additional support as needed.   Her weight has fluctuated, with a recent decrease noted. She mentions her clothes fitting more loosely and needing to purchase smaller sizes. Despite this, her weight remains stable at 129 pounds, weight in 830 pounds on March 24.  She has experienced sleep disturbances in the past but reports improvement, attributing better sleep to exercise. She considered using low-dose Valium for sleep but ultimately did not need it.  Goals of care Ms. Hursey's expressed goals remain clear that her wishes are to continue to treat the treatable without invasive procedures.  She has made this decision not to pursue further oncological interventions.  Wishes to focus solely on her comfort and manage all symptoms aggressively minimizing any suffering.  02/03/24: We discussed her current illness and what it means in the larger context of her on-going co-morbidities. Natural disease trajectory and expectations were discussed.  Mary Erickson is realistic in her understanding of her cancer and overall prognosis.  She is clear in her expressed  wishes no desire to pursue further workup or oncological interventions.  At this time she wishes to focus solely on her quality of life  while taking things 1 day at a time managing her symptoms.   Education provided on the goals of philosophies of care centered around hospice and palliative.  She verbalized understanding expressing her previous experience with her father who is under hospice care at end-of-life.  She is aware this will be most appropriate for her in the near future however at this time she desires to continue to take things day by day making decisions as necessary.    We empathetically expect discussed healthcare limitations.  Ms. Servais confirms her wishes for DNR/DNI.  During her previous hospitalization my colleague assisted with completion of MOST form.  Patient's documented advanced directive is on file identifying her stepmother Marily Shows as her Museum/gallery exhibitions officer who lives in [place], [place] .  Patient does not desire any artificial feedings or forms of life-sustaining measures.  She initially documented she was okay with a 3-day trial of intubation however after discussions understanding her overall cancer prognosis she agrees that this is not ideal expressing wishes for complete DNR/DNI no intubation.  We discussed updating her documents to identify wishes.  She is requesting to update at her follow-up visit.   I discussed the importance of continued conversation with family and their medical providers regarding overall plan of care and treatment options, ensuring decisions are within the context of the patients values and GOCs.  Assessment & Plan Wheezing Wheezing persists despite Advair  and prednisone , affecting exercise tolerance. Oxygen levels adequate. Discussed nebulizer with albuterol for exacerbations, noting jitteriness as a side effect. - Prescribe nebulizer with albuterol for as-needed use up to three times a day. - Coordinate with pharmacy for delivery of  nebulizer and medication.  Fatigue Persistent fatigue limits activities and exercise tolerance. Emphasized rest and pacing.  Weight loss Weight loss concerning due to impact on energy and exercise tolerance. Current weight 129.8 lbs, consistent with previous measurements. Reports looser clothing.  Nausea Intermittent nausea managed with Pepto and Zofran , preference for Pepto due to side effects. Managed with dietary adjustments and occasional baking soda.  Constipation Chronic constipation managed with Miralax  and stool softeners. Constipation is a side effect of Zofran .  Follow-up Plan to monitor symptoms and treatment efficacy. - Schedule follow-up appointment in 6-8 weeks, with option for telehealth or in-person visit.  Patient expressed understanding and was in agreement with this plan. She also understands that She can call the clinic at any time with any questions, concerns, or complaints.   Any controlled substances utilized were prescribed in the context of palliative care. PDMP has been reviewed.   Visit consisted of counseling and education dealing with the complex and emotionally intense issues of symptom management and palliative care in the setting of serious and potentially life-threatening illness.  Dellia Ferguson, AGPCNP-BC  Palliative Medicine Team/[facility]

## 2024-03-22 ENCOUNTER — Emergency Department (HOSPITAL_COMMUNITY)

## 2024-03-22 ENCOUNTER — Inpatient Hospital Stay (HOSPITAL_COMMUNITY)
Admission: EM | Admit: 2024-03-22 | Discharge: 2024-03-26 | DRG: 871 | Disposition: A | Discharge status: Hospice | Attending: Internal Medicine | Admitting: Internal Medicine

## 2024-03-22 ENCOUNTER — Other Ambulatory Visit: Payer: Self-pay

## 2024-03-22 ENCOUNTER — Encounter (HOSPITAL_COMMUNITY): Payer: Self-pay | Admitting: Internal Medicine

## 2024-03-22 DIAGNOSIS — R42 Dizziness and giddiness: Secondary | ICD-10-CM | POA: Diagnosis not present

## 2024-03-22 DIAGNOSIS — N319 Neuromuscular dysfunction of bladder, unspecified: Secondary | ICD-10-CM | POA: Diagnosis present

## 2024-03-22 DIAGNOSIS — R042 Hemoptysis: Secondary | ICD-10-CM | POA: Diagnosis present

## 2024-03-22 DIAGNOSIS — J440 Chronic obstructive pulmonary disease with acute lower respiratory infection: Secondary | ICD-10-CM | POA: Diagnosis present

## 2024-03-22 DIAGNOSIS — K5909 Other constipation: Secondary | ICD-10-CM | POA: Diagnosis present

## 2024-03-22 DIAGNOSIS — B181 Chronic viral hepatitis B without delta-agent: Secondary | ICD-10-CM | POA: Diagnosis present

## 2024-03-22 DIAGNOSIS — A419 Sepsis, unspecified organism: Principal | ICD-10-CM | POA: Diagnosis present

## 2024-03-22 DIAGNOSIS — J189 Pneumonia, unspecified organism: Secondary | ICD-10-CM

## 2024-03-22 DIAGNOSIS — M3500 Sicca syndrome, unspecified: Secondary | ICD-10-CM | POA: Diagnosis present

## 2024-03-22 DIAGNOSIS — E10649 Type 1 diabetes mellitus with hypoglycemia without coma: Secondary | ICD-10-CM | POA: Diagnosis not present

## 2024-03-22 DIAGNOSIS — R110 Nausea: Secondary | ICD-10-CM | POA: Diagnosis present

## 2024-03-22 DIAGNOSIS — E1069 Type 1 diabetes mellitus with other specified complication: Secondary | ICD-10-CM

## 2024-03-22 DIAGNOSIS — C4362 Malignant melanoma of left upper limb, including shoulder: Secondary | ICD-10-CM | POA: Diagnosis present

## 2024-03-22 DIAGNOSIS — K5900 Constipation, unspecified: Secondary | ICD-10-CM | POA: Diagnosis present

## 2024-03-22 DIAGNOSIS — Z515 Encounter for palliative care: Secondary | ICD-10-CM

## 2024-03-22 DIAGNOSIS — Z794 Long term (current) use of insulin: Secondary | ICD-10-CM

## 2024-03-22 DIAGNOSIS — Z881 Allergy status to other antibiotic agents status: Secondary | ICD-10-CM

## 2024-03-22 DIAGNOSIS — C3490 Malignant neoplasm of unspecified part of unspecified bronchus or lung: Secondary | ICD-10-CM | POA: Insufficient documentation

## 2024-03-22 DIAGNOSIS — E109 Type 1 diabetes mellitus without complications: Secondary | ICD-10-CM | POA: Diagnosis present

## 2024-03-22 DIAGNOSIS — Z7952 Long term (current) use of systemic steroids: Secondary | ICD-10-CM

## 2024-03-22 DIAGNOSIS — D649 Anemia, unspecified: Secondary | ICD-10-CM | POA: Diagnosis not present

## 2024-03-22 DIAGNOSIS — E7800 Pure hypercholesterolemia, unspecified: Secondary | ICD-10-CM | POA: Diagnosis present

## 2024-03-22 DIAGNOSIS — Z886 Allergy status to analgesic agent status: Secondary | ICD-10-CM

## 2024-03-22 DIAGNOSIS — M069 Rheumatoid arthritis, unspecified: Secondary | ICD-10-CM | POA: Diagnosis present

## 2024-03-22 DIAGNOSIS — R509 Fever, unspecified: Secondary | ICD-10-CM

## 2024-03-22 DIAGNOSIS — Z66 Do not resuscitate: Secondary | ICD-10-CM | POA: Diagnosis present

## 2024-03-22 DIAGNOSIS — C3411 Malignant neoplasm of upper lobe, right bronchus or lung: Secondary | ICD-10-CM | POA: Diagnosis present

## 2024-03-22 DIAGNOSIS — K219 Gastro-esophageal reflux disease without esophagitis: Secondary | ICD-10-CM | POA: Diagnosis present

## 2024-03-22 DIAGNOSIS — K2100 Gastro-esophageal reflux disease with esophagitis, without bleeding: Secondary | ICD-10-CM | POA: Diagnosis not present

## 2024-03-22 DIAGNOSIS — E871 Hypo-osmolality and hyponatremia: Secondary | ICD-10-CM | POA: Diagnosis present

## 2024-03-22 DIAGNOSIS — R6510 Systemic inflammatory response syndrome (SIRS) of non-infectious origin without acute organ dysfunction: Secondary | ICD-10-CM

## 2024-03-22 DIAGNOSIS — Z7951 Long term (current) use of inhaled steroids: Secondary | ICD-10-CM

## 2024-03-22 DIAGNOSIS — E890 Postprocedural hypothyroidism: Secondary | ICD-10-CM | POA: Diagnosis present

## 2024-03-22 DIAGNOSIS — K581 Irritable bowel syndrome with constipation: Secondary | ICD-10-CM | POA: Diagnosis present

## 2024-03-22 DIAGNOSIS — B1910 Unspecified viral hepatitis B without hepatic coma: Secondary | ICD-10-CM | POA: Insufficient documentation

## 2024-03-22 DIAGNOSIS — I10 Essential (primary) hypertension: Secondary | ICD-10-CM | POA: Diagnosis present

## 2024-03-22 DIAGNOSIS — G893 Neoplasm related pain (acute) (chronic): Secondary | ICD-10-CM | POA: Diagnosis present

## 2024-03-22 DIAGNOSIS — E063 Autoimmune thyroiditis: Secondary | ICD-10-CM | POA: Diagnosis not present

## 2024-03-22 DIAGNOSIS — Z79890 Hormone replacement therapy (postmenopausal): Secondary | ICD-10-CM

## 2024-03-22 DIAGNOSIS — Z1152 Encounter for screening for COVID-19: Secondary | ICD-10-CM

## 2024-03-22 DIAGNOSIS — H548 Legal blindness, as defined in USA: Secondary | ICD-10-CM | POA: Diagnosis present

## 2024-03-22 DIAGNOSIS — Z85820 Personal history of malignant melanoma of skin: Secondary | ICD-10-CM

## 2024-03-22 DIAGNOSIS — Z87891 Personal history of nicotine dependence: Secondary | ICD-10-CM

## 2024-03-22 DIAGNOSIS — F909 Attention-deficit hyperactivity disorder, unspecified type: Secondary | ICD-10-CM | POA: Insufficient documentation

## 2024-03-22 DIAGNOSIS — C3491 Malignant neoplasm of unspecified part of right bronchus or lung: Secondary | ICD-10-CM | POA: Diagnosis not present

## 2024-03-22 DIAGNOSIS — J188 Other pneumonia, unspecified organism: Secondary | ICD-10-CM | POA: Diagnosis present

## 2024-03-22 DIAGNOSIS — R112 Nausea with vomiting, unspecified: Secondary | ICD-10-CM | POA: Diagnosis not present

## 2024-03-22 DIAGNOSIS — I959 Hypotension, unspecified: Secondary | ICD-10-CM | POA: Diagnosis present

## 2024-03-22 DIAGNOSIS — K589 Irritable bowel syndrome without diarrhea: Secondary | ICD-10-CM | POA: Diagnosis present

## 2024-03-22 DIAGNOSIS — Z7189 Other specified counseling: Secondary | ICD-10-CM | POA: Diagnosis not present

## 2024-03-22 LAB — CBC WITH DIFFERENTIAL/PLATELET
Abs Immature Granulocytes: 0.15 10*3/uL — ABNORMAL HIGH (ref 0.00–0.07)
Basophils Absolute: 0.1 10*3/uL (ref 0.0–0.1)
Basophils Relative: 1 %
Eosinophils Absolute: 0 10*3/uL (ref 0.0–0.5)
Eosinophils Relative: 0 %
HCT: 32.1 % — ABNORMAL LOW (ref 36.0–46.0)
Hemoglobin: 10 g/dL — ABNORMAL LOW (ref 12.0–15.0)
Immature Granulocytes: 1 %
Lymphocytes Relative: 6 %
Lymphs Abs: 1.2 10*3/uL (ref 0.7–4.0)
MCH: 27.6 pg (ref 26.0–34.0)
MCHC: 31.2 g/dL (ref 30.0–36.0)
MCV: 88.7 fL (ref 80.0–100.0)
Monocytes Absolute: 1.4 10*3/uL — ABNORMAL HIGH (ref 0.1–1.0)
Monocytes Relative: 7 %
Neutro Abs: 16.9 10*3/uL — ABNORMAL HIGH (ref 1.7–7.7)
Neutrophils Relative %: 85 %
Platelets: 441 10*3/uL — ABNORMAL HIGH (ref 150–400)
RBC: 3.62 MIL/uL — ABNORMAL LOW (ref 3.87–5.11)
RDW: 14.6 % (ref 11.5–15.5)
WBC: 19.8 10*3/uL — ABNORMAL HIGH (ref 4.0–10.5)
nRBC: 0 % (ref 0.0–0.2)

## 2024-03-22 LAB — LACTIC ACID, PLASMA: Lactic Acid, Venous: 1.1 mmol/L (ref 0.5–1.9)

## 2024-03-22 LAB — COMPREHENSIVE METABOLIC PANEL WITH GFR
ALT: 14 U/L (ref 0–44)
AST: 41 U/L (ref 15–41)
Albumin: 2.9 g/dL — ABNORMAL LOW (ref 3.5–5.0)
Alkaline Phosphatase: 122 U/L (ref 38–126)
Anion gap: 11 (ref 5–15)
BUN: 14 mg/dL (ref 6–20)
CO2: 24 mmol/L (ref 22–32)
Calcium: 7.9 mg/dL — ABNORMAL LOW (ref 8.9–10.3)
Chloride: 96 mmol/L — ABNORMAL LOW (ref 98–111)
Creatinine, Ser: 0.67 mg/dL (ref 0.44–1.00)
GFR, Estimated: >60 mL/min (ref >60)
Glucose, Bld: 179 mg/dL — ABNORMAL HIGH (ref 70–99)
Potassium: 3.8 mmol/L (ref 3.5–5.1)
Sodium: 131 mmol/L — ABNORMAL LOW (ref 135–145)
Total Bilirubin: 0.4 mg/dL (ref 0.0–1.2)
Total Protein: 6.6 g/dL (ref 6.5–8.1)

## 2024-03-22 LAB — RESP PANEL BY RT-PCR (RSV, FLU A&B, COVID)  RVPGX2
Influenza A by PCR: NEGATIVE
Influenza B by PCR: NEGATIVE
Resp Syncytial Virus by PCR: NEGATIVE
SARS Coronavirus 2 by RT PCR: NEGATIVE

## 2024-03-22 MED ORDER — METHYLPHENIDATE HCL ER (OSM) 36 MG PO TBCR: 36.0000 mg | EXTENDED_RELEASE_TABLET | Freq: Every morning | ORAL | Status: DC

## 2024-03-22 MED ORDER — LACTATED RINGERS IV SOLN: INTRAVENOUS | Status: DC

## 2024-03-22 MED ORDER — CALCIUM GLUCONATE-NACL 1-0.675 GM/50ML-% IV SOLN
1.0000 g | Freq: Once | INTRAVENOUS | Status: AC
  Administered 2024-03-23: 1000 mg via INTRAVENOUS
  Filled 2024-03-22: qty 50

## 2024-03-22 MED ORDER — SODIUM CHLORIDE 0.9 % IV SOLN
8.0000 mg | Freq: Once | INTRAVENOUS | Status: AC
  Administered 2024-03-22: 8 mg via INTRAVENOUS
  Filled 2024-03-22: qty 4

## 2024-03-22 MED ORDER — KETAMINE HCL 50 MG/5ML IJ SOSY
0.1500 mg/kg | PREFILLED_SYRINGE | Freq: Once | INTRAMUSCULAR | Status: AC
  Administered 2024-03-22: 8.8 mg via INTRAVENOUS

## 2024-03-22 MED ORDER — LINACLOTIDE 145 MCG PO CAPS
145.0000 ug | ORAL_CAPSULE | Freq: Every day | ORAL | Status: DC
  Administered 2024-03-25 – 2024-03-26 (×2): 145 ug via ORAL
  Filled 2024-03-22 (×4): qty 1

## 2024-03-22 MED ORDER — VANCOMYCIN HCL 1.25 G IV SOLR
1250.0000 mg | Freq: Once | INTRAVENOUS | Status: AC
  Administered 2024-03-23: 1250 mg via INTRAVENOUS
  Filled 2024-03-22: qty 25

## 2024-03-22 MED ORDER — ONDANSETRON HCL 4 MG/2ML IJ SOLN
4.0000 mg | Freq: Four times a day (QID) | INTRAMUSCULAR | Status: DC | PRN
  Filled 2024-03-22: qty 2

## 2024-03-22 MED ORDER — LEVOTHYROXINE SODIUM 112 MCG PO TABS: 112.0000 ug | ORAL_TABLET | Freq: Every evening | ORAL | Status: DC

## 2024-03-22 MED ORDER — POLYETHYLENE GLYCOL 3350 17 G PO PACK: 17.0000 g | PACK | Freq: Every day | ORAL | Status: DC | PRN

## 2024-03-22 MED ORDER — SODIUM CHLORIDE 0.9 % IV SOLN
2.0000 g | Freq: Three times a day (TID) | INTRAVENOUS | Status: DC
  Administered 2024-03-23 (×2): 2 g via INTRAVENOUS
  Filled 2024-03-22 (×3): qty 12.5

## 2024-03-22 MED ORDER — KETAMINE HCL 50 MG/5ML IJ SOSY
0.1500 mg/kg | PREFILLED_SYRINGE | Freq: Once | INTRAMUSCULAR | Status: AC
  Administered 2024-03-22: 8.8 mg via INTRAVENOUS
  Filled 2024-03-22: qty 5

## 2024-03-22 MED ORDER — VANCOMYCIN HCL 750 MG/150ML IV SOLN
750.0000 mg | Freq: Two times a day (BID) | INTRAVENOUS | Status: DC
  Filled 2024-03-22: qty 150

## 2024-03-22 MED ORDER — IOHEXOL 350 MG/ML SOLN
75.0000 mL | Freq: Once | INTRAVENOUS | Status: AC | PRN
  Administered 2024-03-22: 75 mL via INTRAVENOUS

## 2024-03-22 MED ORDER — SODIUM CHLORIDE 0.9 % IV SOLN
500.0000 mg | INTRAVENOUS | Status: DC
  Administered 2024-03-22: 500 mg via INTRAVENOUS
  Filled 2024-03-22: qty 5

## 2024-03-22 MED ORDER — MOMETASONE FURO-FORMOTEROL FUM 200-5 MCG/ACT IN AERO
2.0000 | INHALATION_SPRAY | Freq: Two times a day (BID) | RESPIRATORY_TRACT | Status: DC
  Administered 2024-03-23 – 2024-03-26 (×8): 2 via RESPIRATORY_TRACT
  Filled 2024-03-22: qty 8.8

## 2024-03-22 MED ORDER — ONDANSETRON HCL 4 MG PO TABS: 4.0000 mg | ORAL_TABLET | Freq: Four times a day (QID) | ORAL | Status: DC | PRN

## 2024-03-22 MED ORDER — SODIUM CHLORIDE 0.9% FLUSH
3.0000 mL | Freq: Two times a day (BID) | INTRAVENOUS | Status: DC
Start: 2024-03-22 — End: 2024-03-26
  Administered 2024-03-23 – 2024-03-25 (×4): 3 mL via INTRAVENOUS

## 2024-03-22 MED ORDER — SODIUM CHLORIDE 0.9 % IV SOLN: 250.0000 mL | INTRAVENOUS | Status: AC | PRN

## 2024-03-22 MED ORDER — GUAIFENESIN-DM 100-10 MG/5ML PO SYRP
5.0000 mL | ORAL_SOLUTION | Freq: Four times a day (QID) | ORAL | Status: DC
  Administered 2024-03-23 – 2024-03-25 (×4): 5 mL via ORAL
  Filled 2024-03-22 (×5): qty 10

## 2024-03-22 MED ORDER — PREDNISONE 5 MG PO TABS
5.0000 mg | ORAL_TABLET | Freq: Two times a day (BID) | ORAL | Status: DC
  Administered 2024-03-23 – 2024-03-26 (×7): 5 mg via ORAL
  Filled 2024-03-22 (×7): qty 1

## 2024-03-22 MED ORDER — LACTATED RINGERS IV BOLUS
1000.0000 mL | Freq: Once | INTRAVENOUS | Status: AC
  Administered 2024-03-22: 1000 mL via INTRAVENOUS

## 2024-03-22 MED ORDER — SODIUM CHLORIDE 0.9 % IV SOLN
1.0000 g | Freq: Once | INTRAVENOUS | Status: AC
  Administered 2024-03-22: 1 g via INTRAVENOUS
  Filled 2024-03-22: qty 10

## 2024-03-22 MED ORDER — INSULIN ASPART 100 UNIT/ML IJ SOLN
0.0000 [IU] | Freq: Three times a day (TID) | INTRAMUSCULAR | Status: DC
  Filled 2024-03-22: qty 0.06

## 2024-03-22 MED ORDER — SODIUM CHLORIDE 0.9% FLUSH: 3.0000 mL | INTRAVENOUS | Status: DC | PRN

## 2024-03-22 MED ORDER — INSULIN GLARGINE-YFGN 100 UNIT/ML ~~LOC~~ SOLN: 13.0000 [IU] | Freq: Every day | SUBCUTANEOUS | Status: DC

## 2024-03-22 MED ORDER — IPRATROPIUM-ALBUTEROL 0.5-2.5 (3) MG/3ML IN SOLN: 3.0000 mL | RESPIRATORY_TRACT | Status: DC | PRN

## 2024-03-22 MED ORDER — ACETAMINOPHEN 500 MG PO TABS
1000.0000 mg | ORAL_TABLET | Freq: Once | ORAL | Status: AC
  Administered 2024-03-22: 1000 mg via ORAL
  Filled 2024-03-22: qty 2

## 2024-03-22 MED ORDER — PANTOPRAZOLE SODIUM 40 MG PO TBEC
40.0000 mg | DELAYED_RELEASE_TABLET | Freq: Every day | ORAL | Status: DC
  Administered 2024-03-23 – 2024-03-26 (×4): 40 mg via ORAL
  Filled 2024-03-22 (×4): qty 1

## 2024-03-22 MED ORDER — INSULIN ASPART 100 UNIT/ML IJ SOLN
0.0000 [IU] | Freq: Every day | INTRAMUSCULAR | Status: DC
  Filled 2024-03-22: qty 0.05

## 2024-03-22 MED ORDER — HYDROMORPHONE HCL 1 MG/ML IJ SOLN
0.5000 mg | Freq: Once | INTRAMUSCULAR | Status: AC
  Administered 2024-03-22: 0.5 mg via INTRAVENOUS
  Filled 2024-03-22: qty 1

## 2024-03-22 NOTE — ED Notes (Signed)
 Patient is resting comfortably.

## 2024-03-22 NOTE — Progress Notes (Signed)
 Pharmacy Antibiotic Note  Mary Erickson is a 58 y.o. female admitted on 03/22/2024 with sepsis.  PMH significant for NSCLC, Sjogren syndrome.  Pharmacy has been consulted for Vancomycin  and Cefepime  dosing.  Plan: Vancomycin  1250mg  IV x 1 followed by Vancomycin  750 mg IV Q 12 hrs. Goal AUC 400-550. Expected AUC: 517.5  SCr used: 0.8 (rounded up from 0.67) Cefepime  2g IV q8h Follow renal function F/u cultures results & sensitivities    Temp (24hrs), Avg:98.9 F (37.2 C), Min:97.6 F (36.4 C), Max:100.2 F (37.9 C)  Recent Labs  Lab 03/22/24 1827 03/22/24 1844  WBC 19.8*  --   CREATININE 0.67  --   LATICACIDVEN  --  1.1    Estimated Creatinine Clearance: 71 mL/min (by C-G formula based on SCr of 0.67 mg/dL).    Allergies  Allergen Reactions   Doxycycline Other (See Comments)    Elevates liver enzymes    Aspirin  Other (See Comments)    Can take 81 mg but has to be coated; No other Aspirin  due to bleeding in stools from ulcers   Ciprofloxacin  Hives    Per pt, she can tolerate oral but not IV   Nsaids Other (See Comments)    GI issues    Amoxicillin -Pot Clavulanate Nausea Only   Gluten Meal     Avoid due to autoimmune issues    Lactose     Avoid due to autoimmune issues    Codeine Nausea And Vomiting   Hydrocodone -Acetaminophen  Nausea And Vomiting   Oxycodone  Nausea And Vomiting   Sulfa Antibiotics Nausea And Vomiting   Sulfasalazine Nausea And Vomiting    Antimicrobials this admission: 5/11 Ceftriaxone  x 1 5/11 Azithromycin  x 1 5/12 Cefepime  >> 5/12 Vancomycin  >>  Dose adjustments this admission:    Microbiology results: 5/11 BCx:   5/11 Sputum:       Thank you for allowing pharmacy to be a part of this patient's care.  Rulon Councilman, PharmD 03/22/2024 11:39 PM

## 2024-03-22 NOTE — H&P (Incomplete)
 History and Physical    Mary Erickson:[id number] DOB: 09/08/1966 DOA: 03/22/2024  PCP: Nita Bast, NP   Patient coming from: Home   Chief Complaint:  Chief Complaint  Patient presents with   Nausea    Pt arrives via ems today for nausea and dizziness that started approx 3 days ago. 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 Pt has been nauseous despite PTA zofran  4mg  and was given appro 500LR on way in. No chest pain, states sob by "laborous breathing" near febrile here 100.42F    Dizziness    HPI:  Mary Erickson is a 58 y.o. female with medical history significant of stage III adenocarcinoma and non-small cell lung cancer, Sjogren disease, DM type I, GERD, ADHD, rheumatoid arthritis on a steroid, Hashimoto thyroiditis, melanoma, chronic urinary retention on self-catheterization, chronic hepatitis B, and chronic constipation presented emergency department with multiple complaint include hemoptysis, dizziness, cough, fever for 3 days. Patient is also complaining about right upper chest pain hide the lung cancer is located. Patient has been recently seen by palliative care and patient has been wish to continue to treat all the treatable without invasive procedure.  And patient confirmed she wants to be DNR/DNI.   Presentation to ED patient is tachycardic, having low-grade temperature and borderline hypotensive.  O2 sat 100% room air. CBC showing leukocytosis 19.8 (chronic leukocytosis for last 2 months), stable H&H and elevated platelet count 441. CMP showing low sodium 131, low chloride 96, low calcium  7.9 and low albumin 2.9. Lactic acid within normal range. Respiratory panel in process.  CTA chest: No evidence of acute pulmonary embolism and rest of the findings are following: 1. Limited evaluation of the segmental and subsegmental pulmonary arteries, as described above, without definite evidence of acute pulmonary embolism. 2.  Truncated upper lobe and middle lobe branches of the right pulmonary artery, likely secondary to the presence of the large right lung mass. 3. Very large, heterogeneous low-attenuation mass within the posterior aspect of the mid and upper right lung, as described above, with adjacent moderate severity anterior right upper lobe atelectasis and/or postobstructive infiltrate. 4. Small right pleural effusion. 5. Mild lingular and anteromedial right middle lobe linear scarring and/or atelectasis.   As chest CT showing postobstructive infiltrate concern for postobstructive pneumonia and in the ED patient has been treated with ceftriaxone , azithromycin , 1 L of LR bolus, Zofran  and Tylenol  1000 mg.   Significant labs in the ED: Lab Orders         Resp panel by RT-PCR (RSV, Flu A&B, Covid) Anterior Nasal Swab         Culture, blood (routine x 2) Call MD if unable to obtain prior to antibiotics being given         Expectorated Sputum Assessment w Gram Stain, Rflx to Resp Cult         Comprehensive metabolic panel         CBC with Differential         Lactic acid, plasma         Urinalysis, Routine w reflex microscopic -Urine, Catheterized         Legionella Pneumophila Serogp 1 Ur Ag         Strep pneumoniae urinary antigen         Protime-INR         Comprehensive metabolic panel         CBC  Procalcitonin       Review of Systems:  ROS  Past Medical History:  Diagnosis Date   Acid reflux    ADHD (attention deficit hyperactivity disorder)    Anemia    Arthritis    "elbows, hands, neck, shoulders" (10/02/2016)   Calcium  blood increased    Chronic neck pain    Chronic UTI (urinary tract infection)    "from self caths" (10/02/2016)   Hashimoto's disease    Hepatitis B    acute hepatitis B from lancet   History of alcoholism (HCC)    10/02/2016 "sober since 03/14/2005"   History of blood transfusion 1984; 1989; 2008   "quite a few"   History of shortness of breath     Hypercholesteremia    Hypothyroidism    no meds   Internal carotid aneurysm dx'd early 2017   Legally blind in left eye, as defined in [place]     Melanoma (HCC)    left upper arm   Migraines    "quite a few in a month; at least 15" (10/02/2016)   Neuropathy    neck; "not sure if it's from DM or Sjogren's" (10/02/2016)   Pneumonia 1986   "1 yr after major OR when I was under anesthesia for 13 hr"   Seizures (HCC) ~ 2004; ~ 2013   "seizures from diabetes "   Self-catheterizes urinary bladder    "since OR in 1984" (10/02/2016)   Sjogren's syndrome (HCC)    Thyroid  goiter    "still present" (10/02/2016)   Type II diabetes mellitus (HCC)    Urinary retention    self caths   Wears glasses     Past Surgical History:  Procedure Laterality Date   ABDOMINAL HYSTERECTOMY  2008   BILATERAL CARPAL TUNNEL RELEASE Bilateral    BRONCHIAL WASHINGS  12/10/2023   Procedure: BRONCHIAL WASHINGS;  Surgeon: Wilfredo Hanly, MD;  Location: [facility];  Service: Pulmonary;;   ECTOPIC PREGNANCY SURGERY  1989   ENDOBRONCHIAL ULTRASOUND N/A 12/10/2023   Procedure: ENDOBRONCHIAL ULTRASOUND;  Surgeon: Wilfredo Hanly, MD;  Location: [facility];  Service: Pulmonary;  Laterality: N/A;   EXCISION MELANOMA WITH SENTINEL LYMPH NODE BIOPSY Left 10/02/2016   Procedure: WIDE LOCAL EXCISION AND ADVANCEMENT FLAP CLOSURE LEFT UPPER  MELANOMA WITH SENTINEL LYMPH NODE MAPPING AND BIOPSY;  Surgeon: Lockie Rima, MD;  Location: [facility];  Service: General;  Laterality: Left;   EYE SURGERY     FINE NEEDLE ASPIRATION  12/10/2023   Procedure: FINE NEEDLE ASPIRATION (FNA) LINEAR;  Surgeon: Wilfredo Hanly, MD;  Location: [facility];  Service: Pulmonary;;   LAPAROSCOPIC CHOLECYSTECTOMY  ~ 01/2016   MELANOMA EXCISION WITH SENTINEL LYMPH NODE BIOPSY Left 10/02/2016   WIDE LOCAL EXCISION AND ADVANCEMENT FLAP CLOSURE LEFT UPPER  MELANOMA WITH SENTINEL LYMPH NODE MAPPING AND BIOPSY   OVARIAN CYST REMOVAL     SHOULDER  ARTHROSCOPY W/ ROTATOR CUFF REPAIR Right 2014   "partial"   STRABISMUS SURGERY Left 1971   STRABISMUS SURGERY Left 03/18/2020   Procedure: STRABISMUS REPAIR;  Surgeon: Dorothey Gate, MD;  Location: [facility];  Service: Ophthalmology;  Laterality: Left;   THYROIDECTOMY N/A 03/01/2023   Procedure: TOTAL THYROIDECTOMY;  Surgeon: Oralee Billow, MD;  Location: [facility];  Service: General;  Laterality: N/A;   TUMOR EXCISION  1984   from tail bone and part of tail bone removed   VIDEO BRONCHOSCOPY N/A 12/10/2023   Procedure: VIDEO BRONCHOSCOPY WITHOUT FLUORO;  Surgeon: Wilfredo Hanly,  MD;  Location: [facility];  Service: Pulmonary;  Laterality: N/A;     reports that she quit smoking about 17 years ago. Her smoking use included cigarettes. She started smoking about 41 years ago. She has a 36 pack-year smoking history. She has never used smokeless tobacco. She reports that she does not currently use drugs after having used the following drugs: Marijuana. She reports that she does not drink alcohol.  Allergies  Allergen Reactions   Doxycycline Other (See Comments)    Elevates liver enzymes    Aspirin  Other (See Comments)    Can take 81 mg but has to be coated; No other Aspirin  due to bleeding in stools from ulcers   Ciprofloxacin  Hives    Per pt, she can tolerate oral but not IV   Nsaids Other (See Comments)    GI issues    Amoxicillin -Pot Clavulanate Nausea Only   Gluten Meal     Avoid due to autoimmune issues    Lactose     Avoid due to autoimmune issues    Codeine Nausea And Vomiting   Hydrocodone -Acetaminophen  Nausea And Vomiting   Oxycodone  Nausea And Vomiting   Sulfa Antibiotics Nausea And Vomiting   Sulfasalazine Nausea And Vomiting    Family History  Problem Relation Age of Onset   Multiple sclerosis Father    Colon cancer Neg Hx    Stomach cancer Neg Hx    Colon polyps Neg Hx    Esophageal cancer Neg Hx    Rectal cancer Neg Hx     Prior to Admission  medications   Medication Sig Start Date End Date Taking? Authorizing Provider  acetaminophen  (TYLENOL ) 500 MG tablet Take 1,000 mg by mouth every 6 (six) hours as needed for moderate pain.   Yes [provider]  benzocaine  (ORAJEL) 10 % mucosal gel Use as directed 1 Application in the mouth or throat as needed for mouth pain.   Yes [provider]  Cholecalciferol  (VITAMIN D3) 125 MCG (5000 UT) capsule Take 5,000 Units by mouth daily.   Yes [provider]  Cyanocobalamin  (B-12) 3000 MCG CAPS Take 3,000 mcg by mouth daily.   Yes [provider]  diclofenac  Sodium (VOLTAREN ) 1 % GEL Apply 2 g topically 4 (four) times daily. Patient taking differently: Apply 2 g topically 4 (four) times daily as needed (pain). 12/17/19  Yes Darr, Derwood Flor, PA-C  diphenhydrAMINE  HCl (BENADRYL  ALLERGY PO) Take 25 mg by mouth at bedtime.   Yes [provider]  fluticasone-salmeterol (ADVAIR  HFA) 115-21 MCG/ACT inhaler Inhale 2 puffs into the lungs 2 (two) times daily. 01/29/24  Yes Wilfredo Hanly, MD  ibuprofen  (ADVIL ) 400 MG tablet Take 400 mg by mouth every 6 (six) hours as needed for moderate pain (pain score 4-6).   Yes [provider]  insulin  detemir (LEVEMIR  FLEXTOUCH) 100 UNIT/ML FlexPen Inject 4 Units into the skin 2 (two) times daily. Patient taking differently: Inject 8 Units into the skin 2 (two) times daily. 12/18/23  Yes Macdonald Savoy, MD  Insulin  lispro (HUMALOG JUNIOR [facility]) 100 UNIT/ML Inject 1-7 Units into the skin as needed (high blood sugar).   Yes [provider]  Ketotifen  Fumarate ([employer] ALLERGY EYE DROPS OP) Place 1 drop into both eyes 2 (two) times daily.   Yes [provider]  levothyroxine  (SYNTHROID ) 112 MCG tablet Take 112 mcg by mouth every evening.   Yes [provider]  levothyroxine  (SYNTHROID ) 150 MCG tablet Take 150 mcg by mouth every  evening.   Yes [provider]  linaclotide  (LINZESS ) 145 MCG  CAPS capsule Take 1 capsule (145 mcg total) by mouth daily before breakfast. 11/14/23  Yes Dorsey, Ying C, MD  Melatonin 10-10 MG TBCR Take 10 mg by mouth at bedtime.   Yes [provider]  methylphenidate  36 MG PO CR tablet Take 36 mg by mouth every morning.   Yes [provider]  Multiple Vitamins-Minerals (MULTIVITAMINS THER. W/MINERALS) TABS Take 1 tablet by mouth daily.   Yes [provider]  nystatin cream (MYCOSTATIN) Apply 1 Application topically 2 (two) times daily as needed for dry skin.   Yes [provider]  omeprazole  (PRILOSEC) 20 MG capsule Take 1 capsule (20 mg total) by mouth 2 (two) times daily before a meal. 11/14/23  Yes Daina Drum, MD  ondansetron  (ZOFRAN  ODT) 4 MG disintegrating tablet Take 1 tablet (4 mg total) by mouth every 6 (six) hours as needed for nausea or vomiting. 06/18/23  Yes Esterwood, Amy S, PA-C  Polyethyl Glycol-Propyl Glycol (SYSTANE OP) Place 1 drop into both eyes 3 (three) times daily as needed (dry eyes).   Yes [provider]  polyethylene glycol powder (GLYCOLAX /MIRALAX ) 17 GM/SCOOP powder Take 17 g by mouth daily as needed for moderate constipation.   Yes [provider]  predniSONE  (DELTASONE ) 5 MG tablet Take 1 tablet (5 mg total) by mouth 2 (two) times daily. 12/25/23  Yes Macdonald Savoy, MD  amoxicillin  (AMOXIL ) 500 MG capsule Take 1 capsule (500 mg total) by mouth 3 (three) times daily. Patient not taking: Reported on 03/22/2024 01/31/24   Almond Army, MD  ciprofloxacin  (CIPRO ) 500 MG tablet Take 1 tablet (500 mg total) by mouth 2 (two) times daily. Patient not taking: Reported on 03/22/2024 02/10/24   Wilfredo Hanly, MD  Continuous Glucose Sensor (DEXCOM G7 SENSOR) MISC Inject 1 applicator into the skin See admin instructions. Every 10 days 11/27/23   [provider]  glucose blood (ACCU-CHEK GUIDE) test strip 1 each by Other route as needed. 06/05/23   [provider]   insulin  detemir (LEVEMIR ) 100 UNIT/ML FlexPen Inject 16 Units into the skin daily for 6 days. 12/12/23 12/18/23  Macdonald Savoy, MD  predniSONE  (DELTASONE ) 10 MG tablet Takes 6 tablets for 2 days, then 5 tablets for 2 days, then 4 tablets for 2 days, then 3 tablets for 2 days, then 2 tabs for 2 days, then 1 tab for 2 days, and then stop. Patient not taking: Reported on 03/22/2024 12/12/23   Macdonald Savoy, MD     Physical Exam: Vitals:   03/22/24 1824 03/22/24 1830 03/22/24 2100 03/22/24 2226  BP:  113/60 (!) 113/58   Pulse: (!) 103 97 88   Resp: 18 18 18    Temp: 100.2 F (37.9 C)   97.6 F (36.4 C)  TempSrc: Oral     SpO2:  95% 100%     Physical Exam   Labs on Admission: I have personally reviewed following labs and imaging studies  CBC: Recent Labs  Lab 03/22/24 1827  WBC 19.8*  NEUTROABS 16.9*  HGB 10.0*  HCT 32.1*  MCV 88.7  PLT 441*   Basic Metabolic Panel: Recent Labs  Lab 03/22/24 1827  NA 131*  K 3.8  CL 96*  CO2 24  GLUCOSE 179*  BUN 14  CREATININE 0.67  CALCIUM  7.9*   GFR: Estimated Creatinine Clearance: 71 mL/min (by C-G formula based on SCr of 0.67 mg/dL). Liver Function  Tests: Recent Labs  Lab 03/22/24 1827  AST 41  ALT 14  ALKPHOS 122  BILITOT 0.4  PROT 6.6  ALBUMIN 2.9*   No results for input(s): "LIPASE", "AMYLASE" in the last 168 hours. No results for input(s): "AMMONIA" in the last 168 hours. Coagulation Profile: No results for input(s): "INR", "PROTIME" in the last 168 hours. Cardiac Enzymes: No results for input(s): "CKTOTAL", "CKMB", "CKMBINDEX", "TROPONINI", "TROPONINIHS" in the last 168 hours. BNP (last 3 results) No results for input(s): "BNP" in the last 8760 hours. HbA1C: No results for input(s): "HGBA1C" in the last 72 hours. CBG: No results for input(s): "GLUCAP" in the last 168 hours. Lipid Profile: No results for input(s): "CHOL", "HDL", "LDLCALC", "TRIG", "CHOLHDL", "LDLDIRECT" in the last 72  hours. Thyroid  Function Tests: No results for input(s): "TSH", "T4TOTAL", "FREET4", "T3FREE", "THYROIDAB" in the last 72 hours. Anemia Panel: No results for input(s): "VITAMINB12", "FOLATE", "FERRITIN", "TIBC", "IRON", "RETICCTPCT" in the last 72 hours. Urine analysis:    Component Value Date/Time   COLORURINE COLORLESS (A) 01/31/2024 1128   APPEARANCEUR CLEAR 01/31/2024 1128   LABSPEC 1.002 (L) 01/31/2024 1128   PHURINE 7.0 01/31/2024 1128   GLUCOSEU NEGATIVE 01/31/2024 1128   HGBUR NEGATIVE 01/31/2024 1128   BILIRUBINUR NEGATIVE 01/31/2024 1128   BILIRUBINUR negative 12/22/2023 1234   KETONESUR NEGATIVE 01/31/2024 1128   PROTEINUR NEGATIVE 01/31/2024 1128   UROBILINOGEN 0.2 12/22/2023 1234   UROBILINOGEN 0.2 11/21/2022 1337   NITRITE NEGATIVE 01/31/2024 1128   LEUKOCYTESUR MODERATE (A) 01/31/2024 1128    Radiological Exams on Admission: I have personally reviewed images CT Angio Chest Pulmonary Embolism (PE) W or WO Contrast Result Date: 03/22/2024 CLINICAL DATA:  Suspected pulmonary embolism. EXAM: CT ANGIOGRAPHY CHEST WITH CONTRAST TECHNIQUE: Multidetector CT imaging of the chest was performed using the standard protocol during bolus administration of intravenous contrast. Multiplanar CT image reconstructions and MIPs were obtained to evaluate the vascular anatomy. RADIATION DOSE REDUCTION: This exam was performed according to the departmental dose-optimization program which includes automated exposure control, adjustment of the mA and/or kV according to patient size and/or use of iterative reconstruction technique. CONTRAST:  75mL OMNIPAQUE  IOHEXOL  350 MG/ML SOLN COMPARISON:  January 31, 2024 FINDINGS: Cardiovascular: The segmental and subsegmental pulmonary arteries are limited in evaluation secondary to suboptimal opacification with intravenous contrast and areas of overlying artifact. Truncated upper lobe and middle lobe branches of the right pulmonary artery are again seen. Acute  pulmonary embolism is not clearly identified. There is mild cardiomegaly. No pericardial effusion. Mediastinum/Nodes: No enlarged mediastinal, hilar, or axillary lymph nodes. Thyroid  gland, trachea, and esophagus demonstrate no significant findings. Lungs/Pleura: A very large, approximately 12.3 cm x 10.2 cm x 12.0 cm heterogeneous low-attenuation mass is seen within the posterior aspect of the mid and upper right lung (measured 10.1 cm x 10.8 cm on the prior study). Adjacent, moderate severity anterior right upper lobe atelectasis and/or postobstructive infiltrate is seen. Mild lingular and anteromedial right middle lobe linear scarring and/or atelectasis is also present. There is a small right pleural effusion. No pneumothorax is identified. Upper Abdomen: No acute abnormality. Musculoskeletal: No chest wall abnormality. No acute or significant osseous findings. Review of the MIP images confirms the above findings. IMPRESSION: 1. Limited evaluation of the segmental and subsegmental pulmonary arteries, as described above, without definite evidence of acute pulmonary embolism. 2. Truncated upper lobe and middle lobe branches of the right pulmonary artery, likely secondary to the presence of the large right lung mass. 3. Very large, heterogeneous low-attenuation  mass within the posterior aspect of the mid and upper right lung, as described above, with adjacent moderate severity anterior right upper lobe atelectasis and/or postobstructive infiltrate. 4. Small right pleural effusion. 5. Mild lingular and anteromedial right middle lobe linear scarring and/or atelectasis. Electronically Signed   By: Virgle Grime M.D.   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 -Checking blood culture, sputum culture, urine Legionella, urine strep antigen.  Checking procalcitonin level.  Respiratory panel pending. -Continue IV vancomycin  and IV cefepime  with pharmacy consult. - Need to follow-up with culture result for appropriate antibiotic guidance. -Continue check pulse ox, Dulera twice daily, DuoNeb as needed.  Continue pulse ox supplemental oxygen goal to keep O2 sat above 96%. -Consulted and discussed case with pulmonology/PCCM Dr. Charon Copper recommended given patient has a hemoptysis which is at baseline recommended to monitor patient clinically, if O2 sat drop/patient decompensate need to inform pulmonology overnight.  Pulmonology will see patient in the daytime.    Stage III adenocarcinoma/esophagus, cell carcinoma of the lung    DM type I   History of rheumatoid arthritis Sjogren disease  History of Hashimoto thyroiditis Chronic hypothyroidism   History of skin cancer Chronic constipation ADHD Hypocalcemia GERD Chronic hepatitis B?  ADHD  DVT prophylaxis:  SCDs.  Deferring pharmacological DVT prophylaxis in the setting of hemoptysis Code Status:  DNR/DNI(Do NOT Intubate) Diet:  Family Communication:  *** Family was present at bedside, at the time of interview.  Opportunity was given to ask question and all questions were answered satisfactorily.  Disposition Plan:  ***  Consults:  ***  Admission status:   Inpatient, Step Down Unit  Severity of  Illness: The appropriate patient status for this patient is INPATIENT. Inpatient status is judged to be reasonable and necessary in order to provide the required intensity of service to ensure the patient's safety. The patient's presenting symptoms, physical exam findings, and initial radiographic and laboratory data in the context of their chronic comorbidities is felt to place them at high risk for further clinical deterioration. Furthermore, it is not anticipated that the patient will be medically stable for discharge from the hospital within 2 midnights of admission.   * I certify that at the point of admission it is my clinical judgment that the patient will require inpatient hospital care spanning beyond 2 midnights from the point of admission due to high intensity of service, high risk for further deterioration and high frequency of surveillance required.Aaron Aas    Aija Scarfo, MD [facility]  How to contact the [facility] Attending or Consulting provider 7A - 7P or covering provider during after hours 7P -7A, for this patient.  Check the care team in [facility] and  look for a) attending/consulting [facility] provider listed and b) the [facility] team listed Log into [email or web address] and use [place]'s universal password to access. If you do not have the password, please contact the hospital operator. Locate the [facility] provider you are looking for under [facility] and page to a number that you can be directly reached. If you still have difficulty reaching the provider, please page the [facility] (Director on Call) for the Hospitalists listed on amion for assistance.  03/22/2024, 11:43 PM

## 2024-03-22 NOTE — ED Provider Notes (Signed)
 [place] [facility] AT [facility] Provider Note   CSN: [id number] Arrival date & time: 03/22/24  1747     History  Chief Complaint  Patient presents with   Nausea    Pt arrives via ems today for nausea and dizziness that started approx 3 days ago. 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 Pt has been nauseous despite PTA zofran  4mg  and was given appro 500LR on way in. No chest pain, states sob by "laborous breathing" near febrile here 100.51F    Dizziness    Mary Erickson is a 58 y.o. female.   Dizziness Patient is a 59 year old female presents the ED with complaints of dizziness, hemoptysis, worsening cough, fever x 3 days.  Previous medical history of non-small cell lung cancer, Sjogren syndrome, pneumonia, hepatitis B, migraines, chronic UTI, hypothyroidism.  For that she is also experiencing right upper chest pain where her lung cancer is present.  Currently seeing palliative care which she saw 3 days ago.  States that she feels similar to when she was seen in the ED in March with previous pneumonia.  She is noticing that hemoptysis is only blood-tinged sputum.  Denies visual changes, shortness of breath, abdominal pain, vomiting, diarrhea, hematuria, pyuria.     Home Medications Prior to Admission medications   Medication Sig Start Date End Date Taking? Authorizing Provider  acetaminophen  (TYLENOL ) 500 MG tablet Take 1,000 mg by mouth every 6 (six) hours as needed for moderate pain.   Yes [provider]  benzocaine  (ORAJEL) 10 % mucosal gel Use as directed 1 Application in the mouth or throat as needed for mouth pain.   Yes [provider]  Cholecalciferol  (VITAMIN D3) 125 MCG (5000 UT) capsule Take 5,000 Units by mouth daily.   Yes [provider]  Cyanocobalamin  (B-12) 3000 MCG CAPS Take 3,000 mcg by mouth daily.   Yes [provider]  diclofenac  Sodium (VOLTAREN ) 1 % GEL Apply 2 g  topically 4 (four) times daily. Patient taking differently: Apply 2 g topically 4 (four) times daily as needed (pain). 12/17/19  Yes Darr, Derwood Flor, PA-C  diphenhydrAMINE  HCl (BENADRYL  ALLERGY PO) Take 25 mg by mouth at bedtime.   Yes [provider]  fluticasone-salmeterol (ADVAIR  HFA) 115-21 MCG/ACT inhaler Inhale 2 puffs into the lungs 2 (two) times daily. 01/29/24  Yes Wilfredo Hanly, MD  ibuprofen  (ADVIL ) 400 MG tablet Take 400 mg by mouth every 6 (six) hours as needed for moderate pain (pain score 4-6).   Yes [provider]  insulin  detemir (LEVEMIR  FLEXTOUCH) 100 UNIT/ML FlexPen Inject 4 Units into the skin 2 (two) times daily. Patient taking differently: Inject 8 Units into the skin 2 (two) times daily. 12/18/23  Yes Macdonald Savoy, MD  Insulin  lispro (HUMALOG JUNIOR [employer]) 100 UNIT/ML Inject 1-7 Units into the skin as needed (high blood sugar).   Yes [provider]  Ketotifen  Fumarate ([employer] ALLERGY EYE DROPS OP) Place 1 drop into both eyes 2 (two) times daily.   Yes [provider]  levothyroxine  (SYNTHROID ) 112 MCG tablet Take 112 mcg by mouth every evening.   Yes [provider]  levothyroxine  (SYNTHROID ) 150 MCG tablet Take 150 mcg by mouth every evening.   Yes [provider]  linaclotide  (LINZESS ) 145 MCG CAPS capsule Take 1 capsule (145 mcg total) by mouth daily before breakfast. 11/14/23  Yes Daina Drum, MD  Melatonin 10-10 MG TBCR Take 10 mg  by mouth at bedtime.   Yes [provider]  methylphenidate  36 MG PO CR tablet Take 36 mg by mouth every morning.   Yes [provider]  Multiple Vitamins-Minerals (MULTIVITAMINS THER. W/MINERALS) TABS Take 1 tablet by mouth daily.   Yes [provider]  nystatin cream (MYCOSTATIN) Apply 1 Application topically 2 (two) times daily as needed for dry skin.   Yes [provider]  omeprazole  (PRILOSEC) 20 MG capsule Take 1 capsule (20 mg total) by mouth 2  (two) times daily before a meal. 11/14/23  Yes Daina Drum, MD  ondansetron  (ZOFRAN  ODT) 4 MG disintegrating tablet Take 1 tablet (4 mg total) by mouth every 6 (six) hours as needed for nausea or vomiting. 06/18/23  Yes Esterwood, Amy S, PA-C  Polyethyl Glycol-Propyl Glycol (SYSTANE OP) Place 1 drop into both eyes 3 (three) times daily as needed (dry eyes).   Yes [provider]  polyethylene glycol powder (GLYCOLAX /MIRALAX ) 17 GM/SCOOP powder Take 17 g by mouth daily as needed for moderate constipation.   Yes [provider]  predniSONE  (DELTASONE ) 5 MG tablet Take 1 tablet (5 mg total) by mouth 2 (two) times daily. 12/25/23  Yes Macdonald Savoy, MD  amoxicillin  (AMOXIL ) 500 MG capsule Take 1 capsule (500 mg total) by mouth 3 (three) times daily. Patient not taking: Reported on 03/22/2024 01/31/24   Almond Army, MD  ciprofloxacin  (CIPRO ) 500 MG tablet Take 1 tablet (500 mg total) by mouth 2 (two) times daily. Patient not taking: Reported on 03/22/2024 02/10/24   Wilfredo Hanly, MD  Continuous Glucose Sensor (DEXCOM G7 SENSOR) MISC Inject 1 applicator into the skin See admin instructions. Every 10 days 11/27/23   [provider]  glucose blood (ACCU-CHEK GUIDE) test strip 1 each by Other route as needed. 06/05/23   [provider]  insulin  detemir (LEVEMIR ) 100 UNIT/ML FlexPen Inject 16 Units into the skin daily for 6 days. 12/12/23 12/18/23  Macdonald Savoy, MD  predniSONE  (DELTASONE ) 10 MG tablet Takes 6 tablets for 2 days, then 5 tablets for 2 days, then 4 tablets for 2 days, then 3 tablets for 2 days, then 2 tabs for 2 days, then 1 tab for 2 days, and then stop. Patient not taking: Reported on 03/22/2024 12/12/23   Macdonald Savoy, MD      Allergies    Doxycycline, Aspirin , Ciprofloxacin , Nsaids, Amoxicillin -pot clavulanate, Gluten meal, Lactose, Codeine, Hydrocodone -acetaminophen , Oxycodone , Sulfa antibiotics, and Sulfasalazine    Review of  Systems   Review of Systems  Neurological:  Positive for dizziness.  All other systems reviewed and are negative.   Physical Exam Updated Vital Signs BP (!) 113/58   Pulse 88   Temp 97.6 F (36.4 C)   Resp 18   SpO2 100%  Physical Exam  ED Results / Procedures / Treatments   Labs (all labs ordered are listed, but only abnormal results are displayed) Labs Reviewed  COMPREHENSIVE METABOLIC PANEL WITH GFR - Abnormal; Notable for the following components:      Result Value   Sodium 131 (*)    Chloride 96 (*)    Glucose, Bld 179 (*)    Calcium  7.9 (*)    Albumin 2.9 (*)    All other components within normal limits  CBC WITH DIFFERENTIAL/PLATELET - Abnormal; Notable for the following components:   WBC 19.8 (*)    RBC 3.62 (*)    Hemoglobin 10.0 (*)    HCT 32.1 (*)  Platelets 441 (*)    Neutro Abs 16.9 (*)    Monocytes Absolute 1.4 (*)    Abs Immature Granulocytes 0.15 (*)    All other components within normal limits  RESP PANEL BY RT-PCR (RSV, FLU A&B, COVID)  RVPGX2  CULTURE, BLOOD (ROUTINE X 2)  CULTURE, BLOOD (ROUTINE X 2)  EXPECTORATED SPUTUM ASSESSMENT W GRAM STAIN, RFLX TO RESP C  LACTIC ACID, PLASMA  LACTIC ACID, PLASMA  URINALYSIS, ROUTINE W REFLEX MICROSCOPIC  LEGIONELLA PNEUMOPHILA SEROGP 1 UR AG  STREP PNEUMONIAE URINARY ANTIGEN  PROTIME-INR  COMPREHENSIVE METABOLIC PANEL WITH GFR  CBC  PROCALCITONIN  TYPE AND SCREEN    EKG None  Radiology CT Angio Chest Pulmonary Embolism (PE) W or WO Contrast Result Date: 03/22/2024 CLINICAL DATA:  Suspected pulmonary embolism. EXAM: CT ANGIOGRAPHY CHEST WITH CONTRAST TECHNIQUE: Multidetector CT imaging of the chest was performed using the standard protocol during bolus administration of intravenous contrast. Multiplanar CT image reconstructions and MIPs were obtained to evaluate the vascular anatomy. RADIATION DOSE REDUCTION: This exam was performed according to the departmental dose-optimization program which  includes automated exposure control, adjustment of the mA and/or kV according to patient size and/or use of iterative reconstruction technique. CONTRAST:  75mL OMNIPAQUE  IOHEXOL  350 MG/ML SOLN COMPARISON:  January 31, 2024 FINDINGS: Cardiovascular: The segmental and subsegmental pulmonary arteries are limited in evaluation secondary to suboptimal opacification with intravenous contrast and areas of overlying artifact. Truncated upper lobe and middle lobe branches of the right pulmonary artery are again seen. Acute pulmonary embolism is not clearly identified. There is mild cardiomegaly. No pericardial effusion. Mediastinum/Nodes: No enlarged mediastinal, hilar, or axillary lymph nodes. Thyroid  gland, trachea, and esophagus demonstrate no significant findings. Lungs/Pleura: A very large, approximately 12.3 cm x 10.2 cm x 12.0 cm heterogeneous low-attenuation mass is seen within the posterior aspect of the mid and upper right lung (measured 10.1 cm x 10.8 cm on the prior study). Adjacent, moderate severity anterior right upper lobe atelectasis and/or postobstructive infiltrate is seen. Mild lingular and anteromedial right middle lobe linear scarring and/or atelectasis is also present. There is a small right pleural effusion. No pneumothorax is identified. Upper Abdomen: No acute abnormality. Musculoskeletal: No chest wall abnormality. No acute or significant osseous findings. Review of the MIP images confirms the above findings. IMPRESSION: 1. Limited evaluation of the segmental and subsegmental pulmonary arteries, as described above, without definite evidence of acute pulmonary embolism. 2. Truncated upper lobe and middle lobe branches of the right pulmonary artery, likely secondary to the presence of the large right lung mass. 3. Very large, heterogeneous low-attenuation mass within the posterior aspect of the mid and upper right lung, as described above, with adjacent moderate severity anterior right upper lobe  atelectasis and/or postobstructive infiltrate. 4. Small right pleural effusion. 5. Mild lingular and anteromedial right middle lobe linear scarring and/or atelectasis. Electronically Signed   By: Virgle Grime M.D.   On: 03/22/2024 21:46    Procedures Procedures    Medications Ordered in ED Medications  insulin  glargine-yfgn (SEMGLEE ) injection 13 Units (has no administration in time range)  levothyroxine  (SYNTHROID ) tablet 112 mcg (has no administration in time range)  predniSONE  (DELTASONE ) tablet 5 mg (has no administration in time range)  methylphenidate  (CONCERTA ) CR tablet 36 mg (has no administration in time range)  linaclotide  (LINZESS ) capsule 145 mcg (has no administration in time range)  pantoprazole  (PROTONIX ) EC tablet 40 mg (has no administration in time range)  polyethylene glycol (MIRALAX  / GLYCOLAX ) packet 17  g (has no administration in time range)  mometasone-formoterol (DULERA) 200-5 MCG/ACT inhaler 2 puff (has no administration in time range)  sodium chloride  flush (NS) 0.9 % injection 3 mL (has no administration in time range)  sodium chloride  flush (NS) 0.9 % injection 3 mL (has no administration in time range)  0.9 %  sodium chloride  infusion (has no administration in time range)  ondansetron  (ZOFRAN ) tablet 4 mg (has no administration in time range)    Or  ondansetron  (ZOFRAN ) injection 4 mg (has no administration in time range)  guaiFENesin -dextromethorphan  (ROBITUSSIN DM) 100-10 MG/5ML syrup 5 mL (has no administration in time range)  Vancomycin  (VANCOCIN ) 1,250 mg in sodium chloride  0.9 % 250 mL IVPB (has no administration in time range)  ceFEPIme  (MAXIPIME ) 2 g in sodium chloride  0.9 % 100 mL IVPB (has no administration in time range)  insulin  aspart (novoLOG ) injection 0-6 Units (has no administration in time range)  insulin  aspart (novoLOG ) injection 0-5 Units (has no administration in time range)  vancomycin  (VANCOREADY) IVPB 750 mg/150 mL (has no  administration in time range)  lactated ringers  infusion (has no administration in time range)  ipratropium-albuterol (DUONEB) 0.5-2.5 (3) MG/3ML nebulizer solution 3 mL (has no administration in time range)  calcium  gluconate 1 g/ 50 mL sodium chloride  IVPB (has no administration in time range)  lactated ringers  bolus 1,000 mL (0 mLs Intravenous Stopped 03/22/24 2155)  ondansetron  (ZOFRAN ) 8 mg in sodium chloride  0.9 % 50 mL IVPB (0 mg Intravenous Stopped 03/22/24 2053)  acetaminophen  (TYLENOL ) tablet 1,000 mg (1,000 mg Oral Given 03/22/24 1958)  iohexol  (OMNIPAQUE ) 350 MG/ML injection 75 mL (75 mLs Intravenous Contrast Given 03/22/24 2019)  ketamine 50 mg in normal saline 5 mL (10 mg/mL) syringe (8.8 mg Intravenous Given 03/22/24 2141)  cefTRIAXone  (ROCEPHIN ) 1 g in sodium chloride  0.9 % 100 mL IVPB (0 g Intravenous Stopped 03/22/24 2213)  ketamine 50 mg in normal saline 5 mL (10 mg/mL) syringe (8.8 mg Intravenous Given 03/22/24 2224)  HYDROmorphone  (DILAUDID ) injection 0.5 mg (0.5 mg Intravenous Given 03/22/24 2257)    ED Course/ Medical Decision Making/ A&P                                 Medical Decision Making Amount and/or Complexity of Data Reviewed Labs: ordered. Radiology: ordered.  Risk OTC drugs. Prescription drug management. Decision regarding hospitalization.   This patient is a 58 year old female who presents to the ED for concern of fever, dizziness, hemoptysis, worsening cough, right upper chest pain x 3 days with previous history of lung cancer currently seeing palliative care.   Patient was initially found laying on the corner of the room.  And when questioned why she was on the ground in the corner of the room, states that she was hot and felt dizzy and decided to lay down to cool off.  Patient was assisted off the ground into the bed.  Patient noted to be mildly tachycardic at that time, borderline febrile with a temp of 100.2 F.   On physical exam, patient is in no  acute distress, afebrile, alert and orient x 4, speaking in full sentences, nontachypneic.  Borderline tachycardic with heart rate in the high 90s.  Rales noted in the right lung.  No abdominal tenderness noted to palpation.  Patient is able to ambulate on her own.  Exam is is unremarkable with normal neuroexam.   Patient really does not want  to do any opioids or NSAIDs.  Son said we went with ketamine which was unaffected and had discussed with the mother which was effective.  Patient was treated with Rocephin  and azithromycin  for possible underlying pneumonia considering his fever, worsening cough, and effusion in the presence of cancer with previous pneumonia being treated already with amoxicillin  and not with Augmentin and white count remaining elevated throughout that time.  Will have patient admitted for pain pain management and being SIRS positive requiring treatments for possible underlying pneumonia.  Patient care was transferred over to Dr. Sundil.    Differential diagnoses prior to evaluation: The emergent differential diagnosis includes, but is not limited to, pneumonia, PE, bronchitis, metastasis, pleural effusion, ACS, AAS. This is not an exhaustive differential.   Past Medical History / Co-morbidities / Social History: Type 1 diabetes, Sjogren's syndrome, neurogenic bladder, [place] hypertension, hyponatremia, lung cancer, small intestine bacterial overgrowth  Additional history: Chart reviewed. Pertinent results include:   Saw palliative care on 03/19/2024 noted to have been experiencing significant fatigue at that time,  as well as experiencing nausea using Zofran  for and wheezing using inhaler and prednisone  at that time.  Seen in ED on 3/20/125 for hemoptysis patient was noted to have a small volume nonocclusive thrombus in the proximal subsegmental artery with concerns of mass on today postobstructive pneumonia.  Determined not to be a candidate for anticoagulants per pulmonology.   Was supposed to be placed on Augmentin but declined due to stating an allergy and has had was placed on amoxicillin .  Lab Tests/Imaging studies: I personally interpreted labs/imaging and the pertinent results include:   CBC did note a leukocytosis with the white count of 19.8.  As well as a anemia with a hemoglobin of 10.0 decreased from previous eleven 1 month ago CMP notes a mild hyponatremia with a sodium of 131. Lactic acid unremarkable  CT angio shows truncated lobe and middle lobe of right pulmonary artery secondary to presence of large right lung mass, very large lung mass noted mid upper right lobe of right lung with small effusion and mild lingular and anteromedial lateral middle lobe scarring. I agree with the radiologist interpretation.  Medications: I ordered medication including ketamine azelastine, Rocephin , Zofran , Tylenol .  I have reviewed the patients home medicines and have made adjustments as needed.  Social Determinants of Health: Currently being seen by palliative care for lung cancer  Disposition: After consideration of the diagnostic results and the patients response to treatment, I feel that the patient would benefit from admission, patient care transferred over to hospitalist Dr. Sundil.    Final Clinical Impression(s) / ED Diagnoses Final diagnoses:  Hemoptysis  Fever, unspecified fever cause  SIRS (systemic inflammatory response syndrome) (HCC)    Rx / DC Orders ED Discharge Orders     None        Hayes Lipps, PA-C 03/22/24 2348    Afton Horse T, DO 03/26/24 0715

## 2024-03-22 NOTE — ED Notes (Signed)
 Ambulated to restroom unassisted steady gait

## 2024-03-23 ENCOUNTER — Other Ambulatory Visit: Payer: Self-pay

## 2024-03-23 DIAGNOSIS — J189 Pneumonia, unspecified organism: Secondary | ICD-10-CM | POA: Diagnosis not present

## 2024-03-23 DIAGNOSIS — D649 Anemia, unspecified: Secondary | ICD-10-CM

## 2024-03-23 DIAGNOSIS — A419 Sepsis, unspecified organism: Secondary | ICD-10-CM | POA: Diagnosis not present

## 2024-03-23 DIAGNOSIS — G893 Neoplasm related pain (acute) (chronic): Secondary | ICD-10-CM

## 2024-03-23 DIAGNOSIS — C3491 Malignant neoplasm of unspecified part of right bronchus or lung: Secondary | ICD-10-CM

## 2024-03-23 DIAGNOSIS — R112 Nausea with vomiting, unspecified: Secondary | ICD-10-CM

## 2024-03-23 DIAGNOSIS — R42 Dizziness and giddiness: Secondary | ICD-10-CM

## 2024-03-23 LAB — COMPREHENSIVE METABOLIC PANEL WITH GFR
ALT: 13 U/L (ref 0–44)
AST: 34 U/L (ref 15–41)
Albumin: 1.9 g/dL — ABNORMAL LOW (ref 3.5–5.0)
Alkaline Phosphatase: 75 U/L (ref 38–126)
Anion gap: 10 (ref 5–15)
BUN: 12 mg/dL (ref 6–20)
CO2: 20 mmol/L — ABNORMAL LOW (ref 22–32)
Calcium: 7.7 mg/dL — ABNORMAL LOW (ref 8.9–10.3)
Chloride: 98 mmol/L (ref 98–111)
Creatinine, Ser: 0.45 mg/dL (ref 0.44–1.00)
GFR, Estimated: >60 mL/min (ref >60)
Glucose, Bld: 161 mg/dL — ABNORMAL HIGH (ref 70–99)
Potassium: 3.6 mmol/L (ref 3.5–5.1)
Sodium: 128 mmol/L — ABNORMAL LOW (ref 135–145)
Total Bilirubin: 1.1 mg/dL (ref 0.0–1.2)
Total Protein: 4.6 g/dL — ABNORMAL LOW (ref 6.5–8.1)

## 2024-03-23 LAB — GLUCOSE, CAPILLARY
Glucose-Capillary: 140 mg/dL — ABNORMAL HIGH (ref 70–99)
Glucose-Capillary: 158 mg/dL — ABNORMAL HIGH (ref 70–99)
Glucose-Capillary: 201 mg/dL — ABNORMAL HIGH (ref 70–99)
Glucose-Capillary: 290 mg/dL — ABNORMAL HIGH (ref 70–99)

## 2024-03-23 LAB — CBC
HCT: 24.9 % — ABNORMAL LOW (ref 36.0–46.0)
Hemoglobin: 7.6 g/dL — ABNORMAL LOW (ref 12.0–15.0)
MCH: 27.7 pg (ref 26.0–34.0)
MCHC: 30.5 g/dL (ref 30.0–36.0)
MCV: 90.9 fL (ref 80.0–100.0)
Platelets: 322 10*3/uL (ref 150–400)
RBC: 2.74 MIL/uL — ABNORMAL LOW (ref 3.87–5.11)
RDW: 14.8 % (ref 11.5–15.5)
WBC: 14.5 10*3/uL — ABNORMAL HIGH (ref 4.0–10.5)
nRBC: 0 % (ref 0.0–0.2)

## 2024-03-23 LAB — LACTIC ACID, PLASMA: Lactic Acid, Venous: 1.1 mmol/L (ref 0.5–1.9)

## 2024-03-23 LAB — BASIC METABOLIC PANEL WITH GFR
Anion gap: 8 (ref 5–15)
BUN: 12 mg/dL (ref 6–20)
CO2: 23 mmol/L (ref 22–32)
Calcium: 8.4 mg/dL — ABNORMAL LOW (ref 8.9–10.3)
Chloride: 100 mmol/L (ref 98–111)
Creatinine, Ser: 0.54 mg/dL (ref 0.44–1.00)
GFR, Estimated: >60 mL/min (ref >60)
Glucose, Bld: 142 mg/dL — ABNORMAL HIGH (ref 70–99)
Potassium: 3.8 mmol/L (ref 3.5–5.1)
Sodium: 131 mmol/L — ABNORMAL LOW (ref 135–145)

## 2024-03-23 LAB — PROCALCITONIN: Procalcitonin: 0.58 ng/mL

## 2024-03-23 LAB — TYPE AND SCREEN
ABO/RH(D): O POS
Antibody Screen: NEGATIVE

## 2024-03-23 LAB — MRSA NEXT GEN BY PCR, NASAL: MRSA by PCR Next Gen: NOT DETECTED

## 2024-03-23 LAB — PROTIME-INR
INR: 1.1 (ref 0.8–1.2)
Prothrombin Time: 14.1 s (ref 11.4–15.2)

## 2024-03-23 MED ORDER — LEVOTHYROXINE SODIUM 75 MCG PO TABS: 150.0000 ug | ORAL_TABLET | Freq: Every evening | ORAL | Status: DC

## 2024-03-23 MED ORDER — CALCIUM GLUCONATE-NACL 1-0.675 GM/50ML-% IV SOLN
1.0000 g | Freq: Once | INTRAVENOUS | Status: AC
Start: 2024-03-23 — End: 2024-03-23
  Administered 2024-03-23: 1000 mg via INTRAVENOUS
  Filled 2024-03-23: qty 50

## 2024-03-23 MED ORDER — PHENOL 1.4 % MT LIQD
1.0000 | OROMUCOSAL | Status: DC | PRN
  Administered 2024-03-23: 1 via OROMUCOSAL
  Filled 2024-03-23: qty 177

## 2024-03-23 MED ORDER — ALUM & MAG HYDROXIDE-SIMETH 200-200-20 MG/5ML PO SUSP
30.0000 mL | Freq: Once | ORAL | Status: AC
  Administered 2024-03-23: 30 mL via ORAL
  Filled 2024-03-23: qty 30

## 2024-03-23 MED ORDER — DIMENHYDRINATE 50 MG PO TABS
50.0000 mg | ORAL_TABLET | Freq: Three times a day (TID) | ORAL | Status: DC | PRN
  Administered 2024-03-23 – 2024-03-26 (×7): 50 mg via ORAL
  Filled 2024-03-23 (×11): qty 1

## 2024-03-23 MED ORDER — HYDROCODONE BIT-HOMATROP MBR 5-1.5 MG/5ML PO SOLN
5.0000 mL | ORAL | Status: DC | PRN
Start: 2024-03-23 — End: 2024-03-26
  Administered 2024-03-24 – 2024-03-25 (×3): 5 mL via ORAL
  Filled 2024-03-23 (×3): qty 5

## 2024-03-23 MED ORDER — SODIUM CHLORIDE 0.9 % IV SOLN: INTRAVENOUS | Status: AC

## 2024-03-23 MED ORDER — ACETAMINOPHEN 325 MG PO TABS
650.0000 mg | ORAL_TABLET | Freq: Once | ORAL | Status: AC | PRN
  Administered 2024-03-23: 650 mg via ORAL
  Filled 2024-03-23: qty 2

## 2024-03-23 MED ORDER — SODIUM CHLORIDE 0.9 % IV SOLN
12.5000 mg | Freq: Once | INTRAVENOUS | Status: AC
  Administered 2024-03-23: 12.5 mg via INTRAVENOUS
  Filled 2024-03-23: qty 12.5

## 2024-03-23 MED ORDER — INSULIN GLARGINE-YFGN 100 UNIT/ML ~~LOC~~ SOLN
8.0000 [IU] | Freq: Two times a day (BID) | SUBCUTANEOUS | Status: DC
  Administered 2024-03-23 – 2024-03-24 (×5): 8 [IU] via SUBCUTANEOUS
  Filled 2024-03-23 (×6): qty 0.08

## 2024-03-23 MED ORDER — IPRATROPIUM-ALBUTEROL 0.5-2.5 (3) MG/3ML IN SOLN: 3.0000 mL | Freq: Four times a day (QID) | RESPIRATORY_TRACT | Status: DC | PRN

## 2024-03-23 MED ORDER — HYDROMORPHONE HCL 1 MG/ML PO LIQD
0.5000 mg | ORAL | Status: DC | PRN
  Administered 2024-03-23: 1 mg via ORAL
  Filled 2024-03-23: qty 1

## 2024-03-23 MED ORDER — INSULIN ASPART 100 UNIT/ML IJ SOLN
0.0000 [IU] | Freq: Four times a day (QID) | INTRAMUSCULAR | Status: DC
  Administered 2024-03-23: 3 [IU] via SUBCUTANEOUS
  Administered 2024-03-23: 1 [IU] via SUBCUTANEOUS
  Administered 2024-03-23: 2 [IU] via SUBCUTANEOUS
  Administered 2024-03-23: 5 [IU] via SUBCUTANEOUS
  Administered 2024-03-24 (×2): 1 [IU] via SUBCUTANEOUS
  Administered 2024-03-24 – 2024-03-25 (×3): 2 [IU] via SUBCUTANEOUS
  Administered 2024-03-26: 7 [IU] via SUBCUTANEOUS

## 2024-03-23 MED ORDER — PROCHLORPERAZINE EDISYLATE 10 MG/2ML IJ SOLN: 10.0000 mg | Freq: Four times a day (QID) | INTRAMUSCULAR | Status: DC | PRN

## 2024-03-23 MED ORDER — SODIUM CHLORIDE 0.9 % IV BOLUS
1000.0000 mL | INTRAVENOUS | Status: AC
  Administered 2024-03-23: 1000 mL via INTRAVENOUS

## 2024-03-23 MED ORDER — PROMETHAZINE HCL 25 MG RE SUPP
25.0000 mg | Freq: Four times a day (QID) | RECTAL | Status: DC | PRN
  Filled 2024-03-23: qty 1

## 2024-03-23 MED ORDER — LEVOTHYROXINE SODIUM 75 MCG PO TABS
150.0000 ug | ORAL_TABLET | Freq: Every day | ORAL | Status: DC
  Administered 2024-03-23 – 2024-03-26 (×4): 150 ug via ORAL
  Filled 2024-03-23 (×4): qty 2

## 2024-03-23 MED ORDER — ALUM & MAG HYDROXIDE-SIMETH 200-200-20 MG/5 ML NICU TOPICAL: 1.0000 | TOPICAL | Status: DC | PRN

## 2024-03-23 MED ORDER — HYDROMORPHONE HCL 1 MG/ML IJ SOLN
0.5000 mg | INTRAMUSCULAR | Status: DC | PRN
  Administered 2024-03-23 – 2024-03-25 (×6): 0.5 mg via INTRAVENOUS
  Filled 2024-03-23 (×6): qty 0.5

## 2024-03-23 MED ORDER — TRANEXAMIC ACID FOR INHALATION
500.0000 mg | Freq: Once | RESPIRATORY_TRACT | Status: AC
  Administered 2024-03-23: 500 mg via RESPIRATORY_TRACT
  Filled 2024-03-23: qty 10

## 2024-03-23 NOTE — Consult Note (Addendum)
 NAME:  JOHNEISHA PHON, MRN:  [id number], DOB:  [birth year], LOS: 1 ADMISSION DATE:  03/22/2024, CONSULTATION DATE:  5/12 REFERRING MD: Tedra Fears, CHIEF COMPLAINT:  hemoptysis   History of Present Illness:  Ms. Milliard is a 58 year old woman with a history of NSCLC who presented with dyspnea, fever, worsening cough, dizziness, and hemoptysis for 3 days.  She has had chest pain in her right upper chest overlying where her cancer was.  She reports feeling similarly with an episode of pneumonia previously.  Hemoptysis has been streaks of blood in sputum, but is new in the last few days.  Since admission she has not had significant hemoptysis she was recently previously treated with amoxicillin  as an outpatient per admission notes. She has chosen to not treat her cancer aggressively and follows with palliative care for symptom management.  Palliative care notes indicate she would not want invasive procedures. Baseline pulmonary symptoms-- wheezing, significant dyspnea on exertion that limits her activity.  Her main complaints today are pain and nausea.  Per palliative care notes, does not want invasive procedures.    Pertinent  Medical History  NSCLC DM1  Sjogren's syndrome, RA Hypertension Neurogenic bladder requiring self-catheterization SIBO HBV Hypothyroidism Migraines Former tobacco abuse Melanoma, early stage, resected  Significant Hospital Events: Including procedures, antibiotic start and stop dates in addition to other pertinent events   5/11 admitted, started on ceftriaxone  and azithromycin   Interim History / Subjective:    Objective    Blood pressure (!) 96/46, pulse 69, temperature 99.1 F (37.3 C), temperature source Oral, resp. rate 19, SpO2 97%.        Intake/Output Summary (Last 24 hours) at 03/23/2024 0728 Last data filed at 03/23/2024 0600 Gross per 24 hour  Intake 1503.04 ml  Output 900 ml  Net 603.04 ml   There were no vitals filed for this  visit.  Examination: General: Chronically ill-appearing woman lying in bed no acute distress HENT: [place]/AT, eyes anicteric Lungs: Breathing comfortably on room air, limited exam due to patient positioning.  No wheezing or rhonchi.  No conversational dyspnea or tachypnea. Cardiovascular: S1-S2, regular rate and rhythm Extremities: [place] Neuro: Awake, answering questions appropriately      WBC 14.5 H/H 7.6/24.9 Platelets 322   Resolved Hospital Problem list :     Assessment & Plan:  Hemoptysis Advanced NSCLC RUL- at least 3A at time of diagnosis Acute on chronic anemia- significant drop in H/H potentially due to dilution unless she is having more significant hemoptysis than what she had told me -with her reported low level of hemoptysis would not be a candidate for embolization. We discussed her previous aversion to invasive procedures and she didn't want to talk about this much now, but I said it seems that not doing an embolization would be consistent with her previous wishes. She agreed. -adding TXA nebs -can try cough suppressants- hicodan cough syrup would be most potent, but may worsen nausea  Possible RUL CAP -con't empiric antibiotics  R sided chest/ back pain from lung cancer -Recommend increasing opiates. She does not appear to be on opiates as an outpatient, but with cancer- associated pain this is reasonable for long-term opiate pain control  Nausea and vomiting -agree with adding phenergan  -zofran  PRN -can add scopolamine  patch for more continuous level if needed  Dizziness-- may have brain mets from cancer causing this -would treat symptomatically; not sure that imaging her to answer this question would change her treatment if she wouldn't want radiation  PCCM will  be available as needed. Recommend involving palliative care as an inpatient to help make decisions about reasonable OP regimens for her moving forward.   Best Practice (right click and "Reselect  all SmartList Selections" daily)  Per primary  Labs   CBC: Recent Labs  Lab 03/22/24 1827 03/23/24 0504  WBC 19.8* 14.5*  NEUTROABS 16.9*  --   HGB 10.0* 7.6*  HCT 32.1* 24.9*  MCV 88.7 90.9  PLT 441* 322    Basic Metabolic Panel: Recent Labs  Lab 03/22/24 1827 03/23/24 0504  NA 131* 128*  K 3.8 3.6  CL 96* 98  CO2 24 20*  GLUCOSE 179* 161*  BUN 14 12  CREATININE 0.67 0.45  CALCIUM  7.9* 7.7*   GFR: Estimated Creatinine Clearance: 71 mL/min (by C-G formula based on SCr of 0.45 mg/dL). Recent Labs  Lab 03/22/24 1827 03/22/24 1844 03/23/24 0111 03/23/24 0504  PROCALCITON  --   --  0.58  --   WBC 19.8*  --   --  14.5*  LATICACIDVEN  --  1.1 1.1  --     Liver Function Tests: Recent Labs  Lab 03/22/24 1827 03/23/24 0504  AST 41 34  ALT 14 13  ALKPHOS 122 75  BILITOT 0.4 1.1  PROT 6.6 4.6*  ALBUMIN 2.9* 1.9*   No results for input(s): "LIPASE", "AMYLASE" in the last 168 hours. No results for input(s): "AMMONIA" in the last 168 hours.  ABG    Component Value Date/Time   HCO3 27.8 01/31/2024 1128   TCO2 27 12/07/2019 0607   O2SAT 82.2 01/31/2024 1128     Coagulation Profile: Recent Labs  Lab 03/23/24 0111  INR 1.1    Cardiac Enzymes: No results for input(s): "CKTOTAL", "CKMB", "CKMBINDEX", "TROPONINI" in the last 168 hours.  HbA1C: Hgb A1c MFr Bld  Date/Time Value Ref Range Status  12/08/2023 06:12 PM 6.9 (H) 4.8 - 5.6 % Final    Comment:    (NOTE) Pre diabetes:          5.7%-6.4%  Diabetes:              >6.4%  Glycemic control for   <7.0% adults with diabetes   03/01/2023 11:53 AM 6.9 (H) 4.8 - 5.6 % Final    Comment:    (NOTE) Pre diabetes:          5.7%-6.4%  Diabetes:              >6.4%  Glycemic control for   <7.0% adults with diabetes     CBG: Recent Labs  Lab 03/23/24 0039 03/23/24 0525  GLUCAP 290* 201*    Review of Systems:   Review of Systems  Constitutional:  Positive for fever.  Respiratory:  Positive  for cough, hemoptysis and shortness of breath.   Gastrointestinal:  Positive for nausea and vomiting.  Neurological:  Positive for dizziness.     Past Medical History:  She,  has a past medical history of Acid reflux, ADHD (attention deficit hyperactivity disorder), Anemia, Arthritis, Calcium  blood increased, Chronic neck pain, Chronic UTI (urinary tract infection), Hashimoto's disease, Hepatitis B, History of alcoholism (HCC), History of blood transfusion (1984; 1989; 2008), History of shortness of breath, Hypercholesteremia, Hypothyroidism, Internal carotid aneurysm (dx'd early 2017), Legally blind in left eye, as defined in [place] , Melanoma (HCC), Migraines, Neuropathy, Pneumonia (1986), Seizures (HCC) (~ 2004; ~ 2013), Self-catheterizes urinary bladder, Sjogren's syndrome (HCC), Thyroid  goiter, Type II diabetes mellitus (HCC), Urinary retention, and Wears glasses.   Surgical History:  Past Surgical History:  Procedure Laterality Date   ABDOMINAL HYSTERECTOMY  2008   BILATERAL CARPAL TUNNEL RELEASE Bilateral    BRONCHIAL WASHINGS  12/10/2023   Procedure: BRONCHIAL WASHINGS;  Surgeon: Wilfredo Hanly, MD;  Location: [facility];  Service: Pulmonary;;   ECTOPIC PREGNANCY SURGERY  1989   ENDOBRONCHIAL ULTRASOUND N/A 12/10/2023   Procedure: ENDOBRONCHIAL ULTRASOUND;  Surgeon: Wilfredo Hanly, MD;  Location: [facility];  Service: Pulmonary;  Laterality: N/A;   EXCISION MELANOMA WITH SENTINEL LYMPH NODE BIOPSY Left 10/02/2016   Procedure: WIDE LOCAL EXCISION AND ADVANCEMENT FLAP CLOSURE LEFT UPPER  MELANOMA WITH SENTINEL LYMPH NODE MAPPING AND BIOPSY;  Surgeon: Lockie Rima, MD;  Location: [facility];  Service: General;  Laterality: Left;   EYE SURGERY     FINE NEEDLE ASPIRATION  12/10/2023   Procedure: FINE NEEDLE ASPIRATION (FNA) LINEAR;  Surgeon: Wilfredo Hanly, MD;  Location: [facility];  Service: Pulmonary;;   LAPAROSCOPIC CHOLECYSTECTOMY  ~ 01/2016   MELANOMA EXCISION WITH SENTINEL  LYMPH NODE BIOPSY Left 10/02/2016   WIDE LOCAL EXCISION AND ADVANCEMENT FLAP CLOSURE LEFT UPPER  MELANOMA WITH SENTINEL LYMPH NODE MAPPING AND BIOPSY   OVARIAN CYST REMOVAL     SHOULDER ARTHROSCOPY W/ ROTATOR CUFF REPAIR Right 2014   "partial"   STRABISMUS SURGERY Left 1971   STRABISMUS SURGERY Left 03/18/2020   Procedure: STRABISMUS REPAIR;  Surgeon: Dorothey Gate, MD;  Location: [facility];  Service: Ophthalmology;  Laterality: Left;   THYROIDECTOMY N/A 03/01/2023   Procedure: TOTAL THYROIDECTOMY;  Surgeon: Oralee Billow, MD;  Location: [facility];  Service: General;  Laterality: N/A;   TUMOR EXCISION  1984   from tail bone and part of tail bone removed   VIDEO BRONCHOSCOPY N/A 12/10/2023   Procedure: VIDEO BRONCHOSCOPY WITHOUT FLUORO;  Surgeon: Wilfredo Hanly, MD;  Location: [facility];  Service: Pulmonary;  Laterality: N/A;     Social History:   reports that she quit smoking about 17 years ago. Her smoking use included cigarettes. She started smoking about 41 years ago. She has a 36 pack-year smoking history. She has never used smokeless tobacco. She reports that she does not currently use drugs after having used the following drugs: Marijuana. She reports that she does not drink alcohol.   Family History:  Her family history includes Multiple sclerosis in her father. There is no history of Colon cancer, Stomach cancer, Colon polyps, Esophageal cancer, or Rectal cancer.   Allergies Allergies  Allergen Reactions   Doxycycline Other (See Comments)    Elevates liver enzymes    Aspirin  Other (See Comments)    Can take 81 mg but has to be coated; No other Aspirin  due to bleeding in stools from ulcers   Ciprofloxacin  Hives    Per pt, she can tolerate oral but not IV   Nsaids Other (See Comments)    GI issues    Amoxicillin -Pot Clavulanate Nausea Only   Gluten Meal     Avoid due to autoimmune issues    Lactose     Avoid due to autoimmune issues    Codeine Nausea And  Vomiting   Hydrocodone -Acetaminophen  Nausea And Vomiting   Oxycodone  Nausea And Vomiting   Sulfa Antibiotics Nausea And Vomiting   Sulfasalazine Nausea And Vomiting     Home Medications  Prior to Admission medications   Medication Sig Start Date End Date Taking? Authorizing Provider  acetaminophen  (TYLENOL ) 500 MG tablet Take 1,000 mg by mouth every 6 (six) hours as needed  for moderate pain.   Yes [provider]  benzocaine  (ORAJEL) 10 % mucosal gel Use as directed 1 Application in the mouth or throat as needed for mouth pain.   Yes [provider]  Cholecalciferol  (VITAMIN D3) 125 MCG (5000 UT) capsule Take 5,000 Units by mouth daily.   Yes [provider]  Cyanocobalamin  (B-12) 3000 MCG CAPS Take 3,000 mcg by mouth daily.   Yes [provider]  diclofenac  Sodium (VOLTAREN ) 1 % GEL Apply 2 g topically 4 (four) times daily. Patient taking differently: Apply 2 g topically 4 (four) times daily as needed (pain). 12/17/19  Yes Darr, Derwood Flor, PA-C  diphenhydrAMINE  HCl (BENADRYL  ALLERGY PO) Take 25 mg by mouth at bedtime.   Yes [provider]  fluticasone-salmeterol (ADVAIR  HFA) 115-21 MCG/ACT inhaler Inhale 2 puffs into the lungs 2 (two) times daily. 01/29/24  Yes Wilfredo Hanly, MD  ibuprofen  (ADVIL ) 400 MG tablet Take 400 mg by mouth every 6 (six) hours as needed for moderate pain (pain score 4-6).   Yes [provider]  insulin  detemir (LEVEMIR  FLEXTOUCH) 100 UNIT/ML FlexPen Inject 4 Units into the skin 2 (two) times daily. Patient taking differently: Inject 8 Units into the skin 2 (two) times daily. 12/18/23  Yes Macdonald Savoy, MD  Insulin  lispro (HUMALOG JUNIOR [facility]) 100 UNIT/ML Inject 1-7 Units into the skin as needed (high blood sugar).   Yes [provider]  Ketotifen  Fumarate ([employer] ALLERGY EYE DROPS OP) Place 1 drop into both eyes 2 (two) times daily.   Yes [provider]  levothyroxine  (SYNTHROID ) 112 MCG  tablet Take 112 mcg by mouth every evening.   Yes [provider]  levothyroxine  (SYNTHROID ) 150 MCG tablet Take 150 mcg by mouth every evening.   Yes [provider]  linaclotide  (LINZESS ) 145 MCG CAPS capsule Take 1 capsule (145 mcg total) by mouth daily before breakfast. 11/14/23  Yes Dorsey, Ying C, MD  Melatonin 10-10 MG TBCR Take 10 mg by mouth at bedtime.   Yes [provider]  methylphenidate  36 MG PO CR tablet Take 36 mg by mouth every morning.   Yes [provider]  Multiple Vitamins-Minerals (MULTIVITAMINS THER. W/MINERALS) TABS Take 1 tablet by mouth daily.   Yes [provider]  nystatin cream (MYCOSTATIN) Apply 1 Application topically 2 (two) times daily as needed for dry skin.   Yes [provider]  omeprazole  (PRILOSEC) 20 MG capsule Take 1 capsule (20 mg total) by mouth 2 (two) times daily before a meal. 11/14/23  Yes Daina Drum, MD  ondansetron  (ZOFRAN  ODT) 4 MG disintegrating tablet Take 1 tablet (4 mg total) by mouth every 6 (six) hours as needed for nausea or vomiting. 06/18/23  Yes Esterwood, Amy S, PA-C  Polyethyl Glycol-Propyl Glycol (SYSTANE OP) Place 1 drop into both eyes 3 (three) times daily as needed (dry eyes).   Yes [provider]  polyethylene glycol powder (GLYCOLAX /MIRALAX ) 17 GM/SCOOP powder Take 17 g by mouth daily as needed for moderate constipation.   Yes [provider]  predniSONE  (DELTASONE ) 5 MG tablet Take 1 tablet (5 mg total) by mouth 2 (two) times daily. 12/25/23  Yes Macdonald Savoy, MD  amoxicillin  (AMOXIL ) 500 MG capsule Take 1 capsule (500 mg total) by mouth 3 (three) times daily. Patient not taking: Reported on 03/22/2024 01/31/24   Almond Army, MD  ciprofloxacin  (CIPRO ) 500 MG tablet Take 1 tablet (500 mg total) by mouth 2 (two) times daily. Patient  not taking: Reported on 03/22/2024 02/10/24   Wilfredo Hanly, MD  Continuous Glucose Sensor (DEXCOM G7 SENSOR) MISC Inject  1 applicator into the skin See admin instructions. Every 10 days 11/27/23   [provider]  glucose blood (ACCU-CHEK GUIDE) test strip 1 each by Other route as needed. 06/05/23   [provider]  insulin  detemir (LEVEMIR ) 100 UNIT/ML FlexPen Inject 16 Units into the skin daily for 6 days. 12/12/23 12/18/23  Macdonald Savoy, MD  predniSONE  (DELTASONE ) 10 MG tablet Takes 6 tablets for 2 days, then 5 tablets for 2 days, then 4 tablets for 2 days, then 3 tablets for 2 days, then 2 tabs for 2 days, then 1 tab for 2 days, and then stop. Patient not taking: Reported on 03/22/2024 12/12/23   Macdonald Savoy, MD     Critical care time:        Joesph Mussel, DO 03/23/24 9:46 AM [facility]  For contact information, see Amion. If no response to pager, please call PCCM consult pager. After hours, 7PM- 7AM, please call Elink.

## 2024-03-23 NOTE — TOC Initial Note (Signed)
 Transition of Care [facility]) - Initial/Assessment Note    Patient Details  Name: Mary Erickson MRN: [id number] Date of Birth: 11-28-1965  [facility] CM/SW Contact:    Ruben Corolla, RN Phone Number: 03/23/2024, 12:39 PM  Clinical Narrative:  d/c plan home.                 Expected Discharge Plan: Home/Self Care Barriers to Discharge: Continued Medical Work up   Patient Goals and CMS Choice Patient states their goals for this hospitalization and ongoing recovery are:: Home CMS Medicare.gov Compare Post Acute Care list provided to:: Patient Choice offered to / list presented to : Patient  ownership interest in [facility].provided to:: Patient    Expected Discharge Plan and Services                                              Prior Living Arrangements/Services                       Activities of Daily Living   ADL Screening (condition at time of admission) Independently performs ADLs?: Yes (appropriate for developmental age) Is the patient deaf or have difficulty hearing?: No Does the patient have difficulty seeing, even when wearing glasses/contacts?: No Does the patient have difficulty concentrating, remembering, or making decisions?: No  Permission Sought/Granted                  Emotional Assessment              Admission diagnosis:  SIRS (systemic inflammatory response syndrome) (HCC) [R65.10] Hemoptysis [R04.2] Fever, unspecified fever cause [R50.9] Sepsis due to pneumonia (HCC) [J18.9, A41.9] Patient Active Problem List   Diagnosis Date Noted   Sepsis due to pneumonia (HCC) 03/22/2024   ADHD 03/22/2024   Non-small cell lung cancer (NSCLC) (HCC) 03/22/2024   Cough with hemoptysis 03/22/2024   Hypocalcemia 03/22/2024   GERD (gastroesophageal reflux disease) 03/22/2024   History of melanoma 03/22/2024   Hepatitis B 03/22/2024   Adenocarcinoma of upper lobe of right lung (HCC) 12/25/2023    Rheumatoid arthritis in remission (HCC) 12/11/2023   History of seizures 12/11/2023   Chronic constipation 12/11/2023   Small intestinal bacterial overgrowth (SIBO) 12/11/2023   Mass of upper lobe of right lung 12/08/2023   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   Goiter, toxic, multinodular 02/24/2023   Hyponatremia 04/17/2021   IBS (irritable bowel syndrome) 04/17/2021   Dizziness 12/11/2019   Colitis 12/11/2019   Abnormal CT scan, colon    Acute colitis 12/08/2019   Orthostatic hypotension 12/08/2019   Type 1 diabetes mellitus (HCC) 06/30/2019   Hashimoto's disease 06/30/2019   Sjogren syndrome (HCC) 06/30/2019   Hx of Hypertension 06/30/2019   Neurogenic bladder 06/30/2019   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 [place] [address] Phone: [phone] Fax: [phone]     Social Drivers of Health (SDOH) Social History: SDOH Screenings   Food Insecurity: No Food Insecurity (03/23/2024)  Housing: Low Risk  (03/23/2024)  Transportation Needs: No Transportation Needs (  03/23/2024)  Utilities: Not At Risk (03/23/2024)  Depression (PHQ2-9): Medium Risk (06/30/2019)  Financial Resource Strain: Low Risk  (02/13/2024)   Received from [facility]  Physical Activity: Unknown (08/13/2023)   Received from [facility]  Social Connections: Unknown (07/26/2023)   Received from [facility]  Stress: Patient Declined (08/13/2023)   Received from [facility]  Tobacco Use: Medium Risk (03/22/2024)   SDOH Interventions:     Readmission Risk Interventions    12/09/2023    8:24 AM  Readmission Risk Prevention Plan  Transportation Screening Complete  PCP or  Specialist Appt within 5-7 Days Complete  Home Care Screening Complete  Medication Review (RN CM) Complete

## 2024-03-23 NOTE — Progress Notes (Addendum)
 Acute on chronic hyponatremia Lab check showing patient blood pressure is persistently low and sodium has been dropped to 131-128.  Giving 1 L of NS bolus and changing LR to NS continue 125 cc/h.  Hypocalcemia Low calcium  7.7.  Also replating with calcium  gluconate.

## 2024-03-23 NOTE — Progress Notes (Signed)
 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  On palliative care, still receiving treatment but considering transition to hospice; DNR/DNI.  She was found to have post-obstructive PNA and started on Vancomycin /Cefepime .  Pulmonology consulting.  Assessment and Plan:  Sepsis due to postobstructive pneumonia with hemoptysis Presented with blood-tinged sputum, cough, shortness of breath and fever for the last 3 days SIRS criteria including fever, tachycardia, leukocytosis with marginal BPs, concern for sepsis Known history of lung cancer, currently on palliative care and considering to transition to home hospice care Given IV ceftriaxone , azithromycin  -> Vanc and Cefepime  MRSA negative, Vanc discontinued Currently refusing Cefepime /antibiotics Pulmonology consulted Palliative care consulted   Cancer-related pain Continue IV Dilaudid  as needed Palliative care consulted regarding possible transition to hospice Patient reports NOT needing pain medication at home normally   Stage III adenocarcinoma of the lung She decided not to pursue therapy for her cancer and is considering home hospice care Reportedly not a good candidate for immunotherapy due to underlying multiple autoimmune disease processes DNR/DNI Palliative care consulted Continue Advair  for COPD  N/V This is the most concerning symptom for her today Reports that Zofran  is ineffective and Phenergan  also doesn't work as piggy back Will add PO Dramamine (her request) and IV Compazine  as needed for breakthrough She believes that the antibiotics are causing her n/v and prefers no antibiotics at least  overnight, will reconsider tomorrow   DM type I Glucose 290 Chronic steroids -> chronic uncontrolled DM  Continue Semglee  8 units twice daily and moderate-scale SSI     History of rheumatoid arthritis/Sjogren disease Continue prednisone  5 mg twice daily   History of Hashimoto thyroiditis/Chronic hypothyroidism Continue levothyroxine     Acute on chronic hyponatremia Stable, at baseline   Hypocalcemia Repleted with calcium  gluconate   History of IBS Continue Linzess    GERD Continue Protonix  daily and Maalox as needed   Neurogenic bladder History of neurogenic bladder and patient does self cath at home     Consultants: Pulmonology Palliative care  Procedures: None  Antibiotics: Ceftriaxone  x 1 Cefepime  5/11-12 Vancomycin  5/11-12  30 Day Unplanned Readmission Risk Score    Flowsheet Row ED to Hosp-Admission (Current) from 03/22/2024 in [facility] 4TH FLOOR PROGRESSIVE CARE AND UROLOGY  30 Day Unplanned Readmission Risk Score (%) 19.43 Filed at 03/23/2024 0401       This score is the patient's risk of an unplanned readmission within 30 days of being discharged (0 -100%). The score is based on dignosis, age, lab data, medications, orders, and past utilization.   Low:  0-14.9   Medium: 15-21.9   High: 22-29.9   Extreme: 30 and above           Subjective: She is miserable because of nausea as well as pain; she reports that these are not usual symptoms that she has at home.  Dilaudid  IV helped significantly with pain but was changed to PO overnight (changed back to IV today).  She is clear that she wants comfort-focused care but is not yet sure that she ONLY wants comfort.  She believes that the antibiotics are contributing to her n/v and prefers none for today and will reconsider tomorrow.   Objective: Vitals:  03/23/24 1313 03/23/24 1323  BP: (!) 116/94   Pulse: 88   Resp: 16   Temp: (!) 100.5 F (38.1 C)   SpO2: 95% 94%    Intake/Output Summary  (Last 24 hours) at 03/23/2024 1556 Last data filed at 03/23/2024 1555 Gross per 24 hour  Intake 2348.76 ml  Output 1350 ml  Net 998.76 ml   Filed Weights   03/23/24 1301  Weight: 58.7 kg    Exam:  General:  Appears frail, miserable Eyes:   normal lids, iris ENT:  grossly normal hearing, lips & tongue, mmm Cardiovascular:  RRR. No LE edema.  Respiratory:   CTA bilaterally with no wheezes/rales/rhonchi.  Normal respiratory effort. Abdomen:  soft, NT, ND Musculoskeletal:  grossly normal tone BUE/BLE, good ROM, no bony abnormality Psychiatric:  bluntedl mood and affect, speech fluent and appropriate, AOx3 Neurologic:  CN 2-12 grossly intact, moves all extremities in coordinated fashion  Data Reviewed: I have reviewed the patient's lab results since admission.  Pertinent labs for today include:   Na++ 128, down from 131 CO2 20 Glucose 161 Calcium  7.7 Lactate 1.1, 1.1 Procalcitonin 0.58 WBC 14.5, down from 19.8 Hgb 7.6, down from 10 COVID/flu/RSV negative    Family Communication: None present  Disposition: Status is: Inpatient Remains inpatient appropriate because: ongoing management     Time spent: 50 minutes  Unresulted Labs (From admission, onward)     Start     Ordered   03/24/24 0500  CBC with Differential/Platelet  Tomorrow morning,   R       Question:  Specimen collection method  Answer:  Lab=Lab collect   03/23/24 1556   03/24/24 0500  Basic metabolic panel with GFR  Tomorrow morning,   R       Question:  Specimen collection method  Answer:  Lab=Lab collect   03/23/24 1556   03/22/24 2337  Culture, blood (routine x 2) Call MD if unable to obtain prior to antibiotics being given  (COPD / Pneumonia / Cellulitis / Lower Extremity Wound (Diabetic Foot Infection))  BLOOD CULTURE X 2,   R     Comments: If blood cultures drawn in Emergency Department - Do not draw and cancel order    03/22/24 2336   03/22/24 2337  Expectorated Sputum Assessment w Gram Stain,  Rflx to Resp Cult  (COPD / Pneumonia / Cellulitis / Lower Extremity Wound (Diabetic Foot Infection))  Once,   R        03/22/24 2336   03/22/24 2337  Legionella Pneumophila Serogp 1 Ur Ag  (COPD / Pneumonia / Cellulitis / Lower Extremity Wound (Diabetic Foot Infection))  Once,   R        03/22/24 2336   03/22/24 2337  Strep pneumoniae urinary antigen  (COPD / Pneumonia / Cellulitis / Lower Extremity Wound (Diabetic Foot Infection))  Once,   R        03/22/24 2336   03/22/24 1854  Urinalysis, Routine w reflex microscopic -Urine, Catheterized  Once,   URGENT       Question:  Specimen Source  Answer:  Urine, Catheterized   03/22/24 1904             Author: Lorita Rosa, MD 03/23/2024 3:56 PM  For on call review [email or web address].

## 2024-03-23 NOTE — Hospital Course (Signed)
 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  On palliative care, still receiving treatment but considering transition to hospice; DNR/DNI.  She was found to have post-obstructive PNA and started on Vancomycin /Cefepime .  Pulmonology consulting.

## 2024-03-24 DIAGNOSIS — J189 Pneumonia, unspecified organism: Secondary | ICD-10-CM | POA: Diagnosis not present

## 2024-03-24 DIAGNOSIS — C3490 Malignant neoplasm of unspecified part of unspecified bronchus or lung: Secondary | ICD-10-CM

## 2024-03-24 DIAGNOSIS — A419 Sepsis, unspecified organism: Secondary | ICD-10-CM | POA: Diagnosis not present

## 2024-03-24 DIAGNOSIS — G893 Neoplasm related pain (acute) (chronic): Secondary | ICD-10-CM

## 2024-03-24 DIAGNOSIS — Z7189 Other specified counseling: Secondary | ICD-10-CM

## 2024-03-24 LAB — CBC WITH DIFFERENTIAL/PLATELET
Abs Immature Granulocytes: 0.15 10*3/uL — ABNORMAL HIGH (ref 0.00–0.07)
Basophils Absolute: 0 10*3/uL (ref 0.0–0.1)
Basophils Relative: 0 %
Eosinophils Absolute: 0 10*3/uL (ref 0.0–0.5)
Eosinophils Relative: 0 %
HCT: 28.3 % — ABNORMAL LOW (ref 36.0–46.0)
Hemoglobin: 8.8 g/dL — ABNORMAL LOW (ref 12.0–15.0)
Immature Granulocytes: 1 %
Lymphocytes Relative: 5 %
Lymphs Abs: 1.1 10*3/uL (ref 0.7–4.0)
MCH: 28.1 pg (ref 26.0–34.0)
MCHC: 31.1 g/dL (ref 30.0–36.0)
MCV: 90.4 fL (ref 80.0–100.0)
Monocytes Absolute: 2.1 10*3/uL — ABNORMAL HIGH (ref 0.1–1.0)
Monocytes Relative: 10 %
Neutro Abs: 17.3 10*3/uL — ABNORMAL HIGH (ref 1.7–7.7)
Neutrophils Relative %: 84 %
Platelets: 330 10*3/uL (ref 150–400)
RBC: 3.13 MIL/uL — ABNORMAL LOW (ref 3.87–5.11)
RDW: 15 % (ref 11.5–15.5)
WBC: 20.6 10*3/uL — ABNORMAL HIGH (ref 4.0–10.5)
nRBC: 0 % (ref 0.0–0.2)

## 2024-03-24 LAB — GLUCOSE, CAPILLARY
Glucose-Capillary: 101 mg/dL — ABNORMAL HIGH (ref 70–99)
Glucose-Capillary: 127 mg/dL — ABNORMAL HIGH (ref 70–99)
Glucose-Capillary: 128 mg/dL — ABNORMAL HIGH (ref 70–99)
Glucose-Capillary: 133 mg/dL — ABNORMAL HIGH (ref 70–99)
Glucose-Capillary: 154 mg/dL — ABNORMAL HIGH (ref 70–99)
Glucose-Capillary: 92 mg/dL (ref 70–99)

## 2024-03-24 LAB — BASIC METABOLIC PANEL WITH GFR
Anion gap: 8 (ref 5–15)
BUN: 12 mg/dL (ref 6–20)
CO2: 21 mmol/L — ABNORMAL LOW (ref 22–32)
Calcium: 8 mg/dL — ABNORMAL LOW (ref 8.9–10.3)
Chloride: 101 mmol/L (ref 98–111)
Creatinine, Ser: 0.61 mg/dL (ref 0.44–1.00)
GFR, Estimated: >60 mL/min (ref >60)
Glucose, Bld: 41 mg/dL — CL (ref 70–99)
Potassium: 3.6 mmol/L (ref 3.5–5.1)
Sodium: 130 mmol/L — ABNORMAL LOW (ref 135–145)

## 2024-03-24 MED ORDER — ORAL CARE MOUTH RINSE: 15.0000 mL | OROMUCOSAL | Status: DC | PRN

## 2024-03-24 MED ORDER — MORPHINE SULFATE ER 15 MG PO TBCR
15.0000 mg | EXTENDED_RELEASE_TABLET | Freq: Two times a day (BID) | ORAL | Status: DC
  Administered 2024-03-25: 15 mg via ORAL
  Filled 2024-03-24: qty 1

## 2024-03-24 MED ORDER — MORPHINE SULFATE (CONCENTRATE) 10 MG /0.5 ML PO SOLN
5.0000 mg | ORAL | Status: DC | PRN
  Administered 2024-03-25: 5 mg via ORAL
  Filled 2024-03-24: qty 0.5

## 2024-03-24 MED ORDER — FENTANYL 12 MCG/HR TD PT72
1.0000 | MEDICATED_PATCH | TRANSDERMAL | Status: DC
Start: 2024-03-24 — End: 2024-03-24

## 2024-03-24 MED ORDER — SIMETHICONE 80 MG PO CHEW
160.0000 mg | CHEWABLE_TABLET | Freq: Four times a day (QID) | ORAL | Status: DC | PRN
  Administered 2024-03-24 – 2024-03-25 (×2): 160 mg via ORAL
  Filled 2024-03-24 (×2): qty 2

## 2024-03-24 MED ORDER — CIPROFLOXACIN IN D5W 400 MG/200ML IV SOLN
400.0000 mg | Freq: Two times a day (BID) | INTRAVENOUS | Status: DC
  Administered 2024-03-24 – 2024-03-25 (×4): 400 mg via INTRAVENOUS
  Filled 2024-03-24 (×4): qty 200

## 2024-03-24 MED ORDER — BISMUTH SUBSALICYLATE 262 MG/15ML PO SUSP
30.0000 mL | ORAL | Status: DC | PRN
  Administered 2024-03-24 – 2024-03-26 (×6): 30 mL via ORAL
  Filled 2024-03-24 (×2): qty 236

## 2024-03-24 MED ORDER — LORAZEPAM 1 MG PO TABS: 1.0000 mg | ORAL_TABLET | ORAL | Status: DC | PRN

## 2024-03-24 NOTE — Consult Note (Signed)
 Consultation Note Date: 03/24/2024   Patient Name: Mary Erickson  DOB: [birth year]  MRN: [id number]  Age / Sex: 58 y.o., female  PCP: Nita Bast, NP Referring Physician: Lorita Rosa, MD  Reason for Consultation: goals of care  HPI/Patient Profile: 58 y.o. female  with past medical history of stage 3 lung cancer (no further treatment or workup desired), sjogren's, DM1, ADHD, chronic urinary retention requiring self-catheterization, chronic constipation on linzess , admitted on 03/22/2024 with severe post obstructive pneumonia. Palliative consulted for goals of care and symptom management.    Primary Decision Maker PATIENT - surrogate is HCPOA- Marily Shows- HCPOA document on chart reviewed  Discussion: I have reviewed medical records including Care Everywhere, progress notes from this and prior admissions, labs and imaging, discussed with RN.  CT scan 5/11 reviewed and compared with CT scan 3/21- note interval enlargement of mass- was 10x10 cm, now is 12x12 cm.  CBC today with WBC 20.6- increase from 14.5 yesterday. Kidney function WNL. Albumin low at 1.9.   On evaluation patient is awake, alert, oriented x3 and to her situation.   She is followed outpatient by Landa Pine, NP at the cancer center. She has been very clear in her preferences for her cancer treatment that she doesn't want further cancer treatment or workup.   She has been chronically ill most of her life. We spent time discussing her lifetime health journey and complications.      We discussed her current acute illness in the context of her overall chronic illnesses. She has noticed decline in her appetite. She lives on a second floor apartment and it has been harder for her to climb the stairs.   Advanced Care Planning- 30 mins  With patient's permission, advanced care planning was discussed.   GOC were discussed as patient's cancer has  progressed and now with post obstructive pneumonia- she is hopeful to treat the treatable, continue treatment for her pneumonia. We discussed that due to the post-obstructive nature of her cancer- she is high risk of it recurring again. She notes she probably wouldn't want to treat it if it occurred again- would want to focus on symptom management and allow nature to take its course.   She has DNR/DNI in place. She has HCPOA document on her chart.   Hospice services and philosophy of care were discussed. She would like referral to hospice and to have hospice services at home.    We discussed symptom management. She has severe pain in her back, feels like a "hot burning poker". Pain is relieved temporarily with IV dilaudid  pain medication, but it does not last long. She is not requesting the dilaudid  as frequently as she thinks she probably should.  We discussed utilizing a long acting medication with short acting med for breakthrough. She is in agreement with this. She is worried about nausea as a side effect. She also wants to ensure that new opioid is started after she has her next dose of antibiotic in case the antibiotic or the opioid causes her nausea-  then she can differentiate source of nausea.  She has had morphine  in the past and tolerated it well.   SUMMARY OF RECOMMENDATIONS - Continue current interventions- goal is to treat what is treatable -TOC order placed for referral to hospice at home -Symptom management- start MS Contin  15mg  po BID tomorrow morning, start morphine  liquid 5mg  po q2 hr prn for breakthrough pain, continue IV dilaudid  0.5mg  IV q2hr prn for severe pain -Lorazepam 1mg  po for refractory nausea, anxiety and sleep  Code Status/Advance Care Planning:   Code Status: Limited: Do not attempt resuscitation (DNR) -DNR-LIMITED -Do Not Intubate/DNI     Prognosis:   < 6 months  Discharge Planning: Home with Hospice  Primary Diagnoses: Present on Admission:  Type 1  diabetes mellitus (HCC)  Sjogren syndrome (HCC)  Rheumatoid arthritis in remission (HCC)  Neurogenic bladder  Hashimoto's disease  Hx of Hypertension  IBS (irritable bowel syndrome)  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  Gastrointestinal:  Positive for nausea.    Physical Exam Vitals and nursing note reviewed.  Constitutional:      General: She is not in acute distress. Cardiovascular:     Rate and Rhythm: Normal rate.  Pulmonary:     Effort: Pulmonary effort is normal.  Neurological:     Mental Status: She is alert and oriented to person, place, and time.  Psychiatric:        Thought Content: Thought content normal.        Judgment: Judgment normal.     Vital Signs: BP 117/67 (BP Location: Left Arm)   Pulse 86   Temp 98.9 F (37.2 C) (Oral)   Resp 20   Ht 5\' 5"  (1.651 m)   Wt 58.7 kg   SpO2 94%   BMI 21.55 kg/m  Pain Scale: 0-10   Pain Score: Asleep   SpO2: SpO2: 94 % O2 Device:SpO2: 94 % O2 Flow Rate: .   IO: Intake/output summary:  Intake/Output Summary (Last 24 hours) at 03/24/2024 1603 Last data filed at 03/24/2024 1000 Gross per 24 hour  Intake 2010.58 ml  Output --  Net 2010.58 ml    LBM: Last BM Date : 03/24/24 Baseline Weight: Weight: 58.7 kg Most recent weight: Weight: 58.7 kg       Thank you for this consult. Palliative medicine will continue to follow and assist as needed.   Signed by: Micki Alas, AGNP-C Palliative Medicine  Time includes:   Preparing to see the patient (e.g., review of tests) Obtaining and/or reviewing separately obtained history Performing a medically necessary appropriate examination and/or evaluation Counseling and educating the patient/family/caregiver Ordering medications, tests, or procedures Referring and communicating with other health care professionals (when not reported separately) Documenting clinical  information in the electronic or other health record Independently interpreting results (not reported separately) and communicating results to the patient/family/caregiver Care coordination (not reported separately) Clinical documentation   Please contact Palliative Medicine Team phone at [phone] for questions and concerns.  For individual provider: See Tilford Foley

## 2024-03-24 NOTE — Progress Notes (Signed)
 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  On palliative care, still receiving treatment but considering transition to hospice; DNR/DNI.  She was found to have post-obstructive PNA and started on Vancomycin /Cefepime  but patient declined antibiotics.  Pulmonology consulting.  She is now willing to try Cipro , may transition to comfort if she is unable to tolerate it.  Assessment and Plan:  Sepsis due to postobstructive pneumonia with hemoptysis Presented with blood-tinged sputum, cough, shortness of breath and fever for the last 3 days SIRS criteria including fever, tachycardia, leukocytosis with marginal BPs, concern for sepsis Known history of lung cancer, currently on palliative care and considering to transition to home hospice care Given IV ceftriaxone , azithromycin  -> Vanc and Cefepime  MRSA negative, Vanc discontinued Refused Cefepime /antibiotics on 5/12 Pulmonology consulted Palliative care consulted Had more fevers overnight, recognizes the need for antibiotics Reports that she has been able to tolerate Cipro  recently and she denied an allergy to Cipro  so this was removed from her chart and Cipro  was ordered at her request If she is unable to tolerate Cipro , she will consider transition to comfort care   Cancer-related pain Continue IV Dilaudid  as needed Palliative care consulted regarding possible transition to hospice Patient reports NOT needing pain medication at home normally   Stage III adenocarcinoma of the lung She decided not to pursue therapy for her cancer and is considering home hospice care Reportedly not a good candidate for  immunotherapy due to underlying multiple autoimmune disease processes DNR/DNI Palliative care consulted Continue Advair  for COPD   N/V Improved today - she thinks this was directly related to Cefepime  and Vanc from admission Reports that Zofran  is ineffective and Phenergan  also doesn't work as piggy back Added PO Dramamine (her request) and IV Compazine  as needed for breakthrough with relief of symptoms   DM type I Hypoglycemia this AM, likely in the setting of persistent n/v throughout the day yesterday Improved, more stable today Chronic steroids -> chronic uncontrolled DM  Continue Semglee  8 units twice daily and moderate-scale SSI   If nausea recurs, suggest holding Semglee    History of rheumatoid arthritis/Sjogren disease Continue prednisone  5 mg twice daily   History of Hashimoto thyroiditis/Chronic hypothyroidism Continue levothyroxine     Acute on chronic hyponatremia Stable, at baseline   Hypocalcemia Repleted with calcium  gluconate   History of IBS Continue Linzess    GERD Continue Protonix  daily and Maalox as needed   Neurogenic bladder History of neurogenic bladder and patient does self cath at home         Consultants: Pulmonology Palliative care   Procedures: None   Antibiotics: Ceftriaxone  x 1 Cefepime  5/11-12 Vancomycin  5/11-12    30 Day Unplanned Readmission Risk Score    Flowsheet Row ED to Hosp-Admission (Current) from 03/22/2024 in  4TH FLOOR PROGRESSIVE CARE AND UROLOGY  30 Day Unplanned Readmission Risk Score (%) 21.73 Filed at 03/24/2024 0801       This score is the patient's risk of an unplanned readmission within 30 days of being discharged (0 -100%). The score is based on dignosis, age, lab data, medications, orders, and past utilization.   Low:  0-14.9   Medium: 15-21.9   High: 22-29.9  Extreme: 30 and above           Subjective: Feeling better today.  No longer with n/v but had fevers overnight.  Willing to  try Cipro .   Objective: Vitals:   03/24/24 0808 03/24/24 1246  BP:  117/67  Pulse:  86  Resp:  20  Temp:  98.9 F (37.2 C)  SpO2: (S) (!) 88% 94%    Intake/Output Summary (Last 24 hours) at 03/24/2024 1411 Last data filed at 03/24/2024 1000 Gross per 24 hour  Intake 2856.3 ml  Output --  Net 2856.3 ml   Filed Weights   03/23/24 1301  Weight: 58.7 kg    Exam:  General:  Appears frail, miserable Eyes:   normal lids, iris ENT:  grossly normal hearing, lips & tongue, mmm Cardiovascular:  RRR. No LE edema.  Respiratory:   CTA bilaterally with no wheezes/rales/rhonchi.  Normal respiratory effort. Abdomen:  soft, NT, ND Musculoskeletal:  grossly normal tone BUE/BLE, good ROM, no bony abnormality Psychiatric:  bluntedl mood and affect, speech fluent and appropriate, AOx3 Neurologic:  CN 2-12 grossly intact, moves all extremities in coordinated fashion  Data Reviewed: I have reviewed the patient's lab results since admission.  Pertinent labs for today include:   Na++ 130, stable Glucose 41, 92, 101 WBC 20.6 Hgb 8.8    Family Communication: None present; she declined to have me call her step-mother today and says she will call her instead so as not to "scare her"  Disposition: Status is: Inpatient Remains inpatient appropriate because: ongoing management     Time spent: 50 minutes  Unresulted Labs (From admission, onward)     Start     Ordered   03/22/24 2337  Expectorated Sputum Assessment w Gram Stain, Rflx to Resp Cult  (COPD / Pneumonia / Cellulitis / Lower Extremity Wound (Diabetic Foot Infection))  Once,   R        03/22/24 2336   03/22/24 2337  Legionella Pneumophila Serogp 1 Ur Ag  (COPD / Pneumonia / Cellulitis / Lower Extremity Wound (Diabetic Foot Infection))  Once,   R        03/22/24 2336   03/22/24 2337  Strep pneumoniae urinary antigen  (COPD / Pneumonia / Cellulitis / Lower Extremity Wound (Diabetic Foot Infection))  Once,   R        03/22/24  2336   03/22/24 1854  Urinalysis, Routine w reflex microscopic -Urine, Catheterized  Once,   URGENT       Question:  Specimen Source  Answer:  Urine, Catheterized   03/22/24 1904   Unscheduled  CBC with Differential/Platelet  Tomorrow morning,   R       Question:  Specimen collection method  Answer:  Lab=Lab collect   03/24/24 1411   Unscheduled  Basic metabolic panel with GFR  Tomorrow morning,   R       Question:  Specimen collection method  Answer:  Lab=Lab collect   03/24/24 1411             Author: Lorita Rosa, MD 03/24/2024 2:11 PM  For on call review [email or web address].

## 2024-03-24 NOTE — Inpatient Diabetes Management (Signed)
 Inpatient Diabetes Program Recommendations  AACE/ADA: New Consensus Statement on Inpatient Glycemic Control (2015)  Target Ranges:  Prepandial:   less than 140 mg/dL      Peak postprandial:   less than 180 mg/dL (1-2 hours)      Critically ill patients:  140 - 180 mg/dL   Lab Results  Component Value Date   GLUCAP 128 (H) 03/24/2024   HGBA1C 6.9 (H) 12/08/2023    Review of Glycemic Control  Latest Reference Range & Units 03/23/24 05:25 03/23/24 11:18 03/23/24 17:41 03/23/24 23:58 03/24/24 05:54 03/24/24 08:00 03/24/24 09:11  Glucose-Capillary 70 - 99 mg/dL 161 (H) 096 (H) 045 (H) 127 (H) 92 101 (H) 128 (H)  (H): Data is abnormally high Diabetes history: Type 1 DM Outpatient Diabetes medications: Levemir  8 units BID Current orders for Inpatient glycemic control: Semglee  8 units BID, Novolog  0-9 units Q6H Prednisone  5 mg BID  Inpatient Diabetes Program Recommendations:    Noted hypoglycemia on serum glucose. Consider reducing Semglee  to 5 units BID.   Thanks, Marjo Sievert, MSN, RNC-OB Diabetes Coordinator [phone] (8a-5p)

## 2024-03-24 NOTE — Progress Notes (Signed)
   03/23/24 2149  Assess: MEWS Score  Temp (!) 102.5 F (39.2 C)  BP (!) 112/50  MAP (mmHg) 67  Pulse Rate 94  Resp 17  SpO2 92 %  O2 Device Room Air  Assess: MEWS Score  MEWS Temp 2  MEWS Systolic 0  MEWS Pulse 0  MEWS RR 0  MEWS LOC 0  MEWS Score 2  MEWS Score Color Yellow  Assess: if the MEWS score is Yellow or Red  Were vital signs accurate and taken at a resting state? Yes  Does the patient meet 2 or more of the SIRS criteria? Yes  Does the patient have a confirmed or suspected source of infection? Yes  Notify: Charge Nurse/RN  Name of Charge Nurse/RN Notified Lauren, RN  Provider Notification  Provider Name/Title Denece Finger, NP  Date Provider Notified 03/23/24  Time Provider Notified 1951  Method of Notification Page  Notification Reason Other (Comment) (fever)  Provider response See new orders  Date of Provider Response 03/23/24  Time of Provider Response 1957  Assess: SIRS CRITERIA  SIRS Temperature  1  SIRS Respirations  0  SIRS Pulse 1  SIRS WBC 0  SIRS Score Sum  2

## 2024-03-24 NOTE — Progress Notes (Signed)
 There was IV consult for placing a PIV access. Next time of abx dose is midnight. Suggested to patient's RN that give patient time for without PIV and night shift nurse can put in the consult before she is getting the abx. [facility]'s nurse Camilo Cella RN agreed with it. No PIV access at this time. HS [employer]

## 2024-03-25 LAB — CBC WITH DIFFERENTIAL/PLATELET
Abs Immature Granulocytes: 0.21 10*3/uL — ABNORMAL HIGH (ref 0.00–0.07)
Basophils Absolute: 0.1 10*3/uL (ref 0.0–0.1)
Basophils Relative: 0 %
Eosinophils Absolute: 0 10*3/uL (ref 0.0–0.5)
Eosinophils Relative: 0 %
HCT: 31.9 % — ABNORMAL LOW (ref 36.0–46.0)
Hemoglobin: 9.9 g/dL — ABNORMAL LOW (ref 12.0–15.0)
Immature Granulocytes: 1 %
Lymphocytes Relative: 7 %
Lymphs Abs: 1.6 10*3/uL (ref 0.7–4.0)
MCH: 28 pg (ref 26.0–34.0)
MCHC: 31 g/dL (ref 30.0–36.0)
MCV: 90.4 fL (ref 80.0–100.0)
Monocytes Absolute: 1.9 10*3/uL — ABNORMAL HIGH (ref 0.1–1.0)
Monocytes Relative: 9 %
Neutro Abs: 18.3 10*3/uL — ABNORMAL HIGH (ref 1.7–7.7)
Neutrophils Relative %: 83 %
Platelets: 403 10*3/uL — ABNORMAL HIGH (ref 150–400)
RBC: 3.53 MIL/uL — ABNORMAL LOW (ref 3.87–5.11)
RDW: 14.7 % (ref 11.5–15.5)
WBC: 22 10*3/uL — ABNORMAL HIGH (ref 4.0–10.5)
nRBC: 0 % (ref 0.0–0.2)

## 2024-03-25 LAB — GLUCOSE, CAPILLARY
Glucose-Capillary: 111 mg/dL — ABNORMAL HIGH (ref 70–99)
Glucose-Capillary: 156 mg/dL — ABNORMAL HIGH (ref 70–99)
Glucose-Capillary: 170 mg/dL — ABNORMAL HIGH (ref 70–99)
Glucose-Capillary: 225 mg/dL — ABNORMAL HIGH (ref 70–99)
Glucose-Capillary: 61 mg/dL — ABNORMAL LOW (ref 70–99)
Glucose-Capillary: 72 mg/dL (ref 70–99)

## 2024-03-25 LAB — BASIC METABOLIC PANEL WITH GFR
Anion gap: 9 (ref 5–15)
BUN: 11 mg/dL (ref 6–20)
CO2: 21 mmol/L — ABNORMAL LOW (ref 22–32)
Calcium: 8.3 mg/dL — ABNORMAL LOW (ref 8.9–10.3)
Chloride: 100 mmol/L (ref 98–111)
Creatinine, Ser: 0.61 mg/dL (ref 0.44–1.00)
GFR, Estimated: >60 mL/min (ref >60)
Glucose, Bld: 50 mg/dL — ABNORMAL LOW (ref 70–99)
Potassium: 3.6 mmol/L (ref 3.5–5.1)
Sodium: 130 mmol/L — ABNORMAL LOW (ref 135–145)

## 2024-03-25 MED ORDER — FENTANYL 12 MCG/HR TD PT72
1.0000 | MEDICATED_PATCH | TRANSDERMAL | Status: DC
  Administered 2024-03-25: 1 via TRANSDERMAL
  Filled 2024-03-25: qty 1

## 2024-03-25 MED ORDER — MORPHINE SULFATE (CONCENTRATE) 10 MG /0.5 ML PO SOLN
10.0000 mg | ORAL | Status: DC | PRN
  Administered 2024-03-25: 10 mg via SUBLINGUAL
  Filled 2024-03-25: qty 0.5

## 2024-03-25 MED ORDER — INSULIN GLARGINE-YFGN 100 UNIT/ML ~~LOC~~ SOLN
4.0000 [IU] | Freq: Every day | SUBCUTANEOUS | Status: DC
  Administered 2024-03-26: 4 [IU] via SUBCUTANEOUS
  Filled 2024-03-25: qty 0.04

## 2024-03-25 NOTE — Plan of Care (Signed)
  Problem: Education: Goal: Ability to describe self-care measures that may prevent or decrease complications (Diabetes Survival Skills Education) will improve Outcome: Progressing   Problem: Coping: Goal: Ability to adjust to condition or change in health will improve Outcome: Progressing   Problem: Fluid Volume: Goal: Ability to maintain a balanced intake and output will improve Outcome: Progressing   Problem: Health Behavior/Discharge Planning: Goal: Ability to identify and utilize available resources and services will improve Outcome: Progressing Goal: Ability to manage health-related needs will improve Outcome: Progressing   Problem: Metabolic: Goal: Ability to maintain appropriate glucose levels will improve Outcome: Progressing   Problem: Nutritional: Goal: Maintenance of adequate nutrition will improve Outcome: Progressing Goal: Progress toward achieving an optimal weight will improve Outcome: Progressing   Problem: Skin Integrity: Goal: Risk for impaired skin integrity will decrease Outcome: Progressing   Problem: Tissue Perfusion: Goal: Adequacy of tissue perfusion will improve Outcome: Progressing   Problem: Education: Goal: Knowledge of General Education information will improve Description: Including pain rating scale, medication(s)/side effects and non-pharmacologic comfort measures Outcome: Progressing   Problem: Activity: Goal: Risk for activity intolerance will decrease Outcome: Progressing   Problem: Coping: Goal: Level of anxiety will decrease Outcome: Progressing   Problem: Pain Managment: Goal: General experience of comfort will improve and/or be controlled Outcome: Progressing   Problem: Safety: Goal: Ability to remain free from injury will improve Outcome: Progressing   Problem: Skin Integrity: Goal: Risk for impaired skin integrity will decrease Outcome: Progressing   Problem: Respiratory: Goal: Ability to maintain adequate  ventilation will improve Outcome: Progressing

## 2024-03-25 NOTE — Progress Notes (Addendum)
 Hypoglycemic Event  CBG: 61  Treatment: 4 oz juice/soda  Symptoms: Weakness  Follow-up CBG: Time:  0610 CBG Result: 72  Possible Reasons for Event: Inadequate meal intake  Comments/MD notified: Laurence Pons, NP    Verdie Gladden, Jenae Tomasello N

## 2024-03-25 NOTE — Progress Notes (Signed)
 [facility] Liaison Note  Received request from [facility] for hospice services at home after discharge. Spoke with patient to initiate education related to hospice philosophy, services and team approach to care. Patient verbalized understanding of information given. Per discussion, the plan is for discharge home tomorrow.   DME needs discussed. Patient has the following equipment in the home: none. Patient requests the following equipment for delivery: oxygen.  Please send signed and completed DNR home with patient/family. Please provide prescriptions at discharge as needed to ensure ongoing symptom management.  [facility] information and contact numbers given to patient. Please call with any concerns.  Thank you for the opportunity to participate in this patient's care.   Ardine Beckwith, LPN [facility] Liaison [phone]

## 2024-03-25 NOTE — TOC Progression Note (Signed)
 Transition of Care [facility]) - Progression Note    Patient Details  Name: Mary Erickson MRN: [id number] Date of Birth: 14-Oct-1966  [facility] CM/SW Contact  Abdoulaye Drum, Thersia Flax, RN Phone Number: 03/25/2024, 1:26 PM  Clinical Narrative:  Referral for home w/hospice [facility] rep to eval await outcome.      Expected Discharge Plan: Home w Hospice Care Barriers to Discharge: Continued Medical Work up  Expected Discharge Plan and Services                                               Social Determinants of Health (SDOH) Interventions SDOH Screenings   Food Insecurity: No Food Insecurity (03/23/2024)  Housing: Low Risk  (03/23/2024)  Transportation Needs: No Transportation Needs (03/23/2024)  Utilities: Not At Risk (03/23/2024)  Depression (PHQ2-9): Medium Risk (06/30/2019)  Financial Resource Strain: Low Risk  (02/13/2024)   Received from [facility]  Physical Activity: Unknown (08/13/2023)   Received from [facility]  Social Connections: Unknown (07/26/2023)   Received from [facility]  Stress: Patient Declined (08/13/2023)   Received from [facility]  Tobacco Use: Medium Risk (03/22/2024)    Readmission Risk Interventions    03/23/2024   12:39 PM 12/09/2023    8:24 AM  Readmission Risk Prevention Plan  Transportation Screening Complete Complete  PCP or Specialist Appt within 5-7 Days  Complete  PCP or Specialist Appt within 3-5 Days Complete   Home Care Screening  Complete  Medication Review (RN CM)  Complete  HRI or Home Care Consult Complete   Social Work Consult for Recovery Care Planning/Counseling Complete   Palliative Care Screening Complete   Medication Review Oceanographer) Complete

## 2024-03-25 NOTE — Progress Notes (Signed)
 Daily Progress Note   Patient Name: Mary Erickson       Date: 03/25/2024 DOB: [birth year]  Age: 58 y.o. MRN#: [id number] Attending Physician: Enrigue Harvard, DO Primary Care Physician: Nita Bast, NP Admit Date: 03/22/2024  Reason for Consultation/Follow-up: Establishing goals of care  Patient Profile/HPI:  58 y.o. female  with past medical history of stage 3 lung cancer (no further treatment or workup desired), sjogren's, DM1, ADHD, chronic urinary retention requiring self-catheterization, chronic constipation on linzess , admitted on 03/22/2024 with severe post obstructive pneumonia. Palliative consulted for goals of care and symptom management.      Subjective: Chart reviewed.  Met with patient. She has continued to have severe pain today.  Discussed with Dr. Vann.  She notes that all po medications are causing her stomach pain. She would like to have as many medications NOT by mouth as possible. She notes a history of not tolerating po medications due to her autoimmune illness. She reports significant severe pain with taking MS Contin  this morning.  We discussed transitioning to fentanyl  patch and she would like to try this. She inquired about hydromorphone  patch- I'm not familiar with an available hydromorphone  patch. I researched further and unfortunately, there isn't a hydromorphone  patch available.  I recommended morphine  concentrated solution that can be absorbed sublingually for breakthrough pain.   Review of Systems  Constitutional:  Positive for malaise/fatigue and weight loss.  Musculoskeletal:  Positive for back pain.     Physical Exam Vitals and nursing note reviewed.             Vital Signs: BP 123/69 (BP Location: Left Arm)   Pulse 82   Temp 99 F (37.2 C)  (Oral)   Resp 20   Ht 5\' 5"  (1.651 m)   Wt 58.7 kg   SpO2 94%   BMI 21.55 kg/m  SpO2: SpO2: 94 % O2 Device: O2 Device: Room Air O2 Flow Rate:    Intake/output summary:  Intake/Output Summary (Last 24 hours) at 03/25/2024 1423 Last data filed at 03/25/2024 0306 Gross per 24 hour  Intake 200 ml  Output --  Net 200 ml   LBM: Last BM Date : 03/24/24 Baseline Weight: Weight: 58.7 kg Most recent weight: Weight: 58.7 kg       Palliative Assessment/Data: PPS: 60%  Patient Active Problem List   Diagnosis Date Noted   Sepsis due to pneumonia (HCC) 03/22/2024   ADHD 03/22/2024   Non-small cell lung cancer (NSCLC) (HCC) 03/22/2024   Cough with hemoptysis 03/22/2024   Hypocalcemia 03/22/2024   GERD (gastroesophageal reflux disease) 03/22/2024   History of melanoma 03/22/2024   Hepatitis B 03/22/2024   Adenocarcinoma of upper lobe of right lung (HCC) 12/25/2023   Rheumatoid arthritis in remission (HCC) 12/11/2023   History of seizures 12/11/2023   Chronic constipation 12/11/2023   Small intestinal bacterial overgrowth (SIBO) 12/11/2023   Mass of upper lobe of right lung 12/08/2023   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   Goiter, toxic, multinodular 02/24/2023   Hyponatremia 04/17/2021   IBS (irritable bowel syndrome) 04/17/2021   Dizziness 12/11/2019   Colitis 12/11/2019   Abnormal CT scan, colon    Acute colitis 12/08/2019   Orthostatic hypotension 12/08/2019   Type 1 diabetes mellitus (HCC) 06/30/2019   Hashimoto's disease 06/30/2019   Sjogren syndrome (HCC) 06/30/2019   Hx of Hypertension 06/30/2019   Neurogenic bladder 06/30/2019   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) 10/02/2016    Palliative Care Assessment & Plan    Assessment/Recommendations/Plan  Stop MS Contin  Start Fentanyl  12.5mcg patch- chang q72 hours Continue morphine  concentrated solution Continue hydromorphone  0.5mg  IV for severe pain Continue lorazepam 1mg  sublingual q4hr prn for  breakthrough nausea, anxiety, sleep D/C home with hospice- patient would benefit from meds to bed before discharge   Code Status:   Code Status: Limited: Do not attempt resuscitation (DNR) -DNR-LIMITED -Do Not Intubate/DNI    Prognosis:  < 6 months  Discharge Planning: Home with Hospice  Care plan was discussed with patient and Dr. Mikle Alexanders.   Thank you for allowing the Palliative Medicine Team to assist in the care of this patient.  Total time:  Prolonged billing:  Time includes:   Preparing to see the patient (e.g., review of tests) Obtaining and/or reviewing separately obtained history Performing a medically necessary appropriate examination and/or evaluation Counseling and educating the patient/family/caregiver Ordering medications, tests, or procedures Referring and communicating with other health care professionals (when not reported separately) Documenting clinical information in the electronic or other health record Independently interpreting results (not reported separately) and communicating results to the patient/family/caregiver Care coordination (not reported separately) Clinical documentation  Micki Alas, AGNP-C Palliative Medicine   Please contact Palliative Medicine Team phone at [phone] for questions and concerns.

## 2024-03-25 NOTE — TOC Progression Note (Incomplete Revision)
 Transition of Care [facility]) - Progression Note    Patient Details  Name: Mary Erickson MRN: [id number] Date of Birth: [birth year]  [facility]) CM/SW Contact  Advay Volante, Thersia Flax, RN Phone Number: 03/25/2024, 1:26 PM  Clinical Narrative:  Referral for home w/hospice [facility] rep to eval await outcome.   -3:38p     Expected Discharge Plan: Home w Hospice Care Barriers to Discharge: Continued Medical Work up  Expected Discharge Plan and Services                                               Social Determinants of Health (SDOH) Interventions SDOH Screenings   Food Insecurity: No Food Insecurity (03/23/2024)  Housing: Low Risk  (03/23/2024)  Transportation Needs: No Transportation Needs (03/23/2024)  Utilities: Not At Risk (03/23/2024)  Depression (PHQ2-9): Medium Risk (06/30/2019)  Financial Resource Strain: Low Risk  (02/13/2024)   Received from [facility]  Physical Activity: Unknown (08/13/2023)   Received from [facility]  Social Connections: Unknown (07/26/2023)   Received from [facility]  Stress: Patient Declined (08/13/2023)   Received from [facility]  Tobacco Use: Medium Risk (03/22/2024)    Readmission Risk Interventions    03/23/2024   12:39 PM 12/09/2023    8:24 AM  Readmission Risk Prevention Plan  Transportation Screening Complete Complete  PCP or Specialist Appt within 5-7 Days  Complete  PCP or Specialist Appt within 3-5 Days Complete   Home Care Screening  Complete  Medication Review (RN CM)  Complete  HRI or Home Care Consult Complete   Social Work Consult for Recovery Care Planning/Counseling Complete   Palliative Care Screening Complete   Medication Review Oceanographer) Complete

## 2024-03-25 NOTE — Progress Notes (Signed)
 Progress Note   Patient: Mary Erickson ZOX:[id number] DOB: 09-04-1966 DOA: 03/22/2024     3 DOS: the patient was seen and examined on 03/25/2024   Brief hospital course: 58yo with h/o stage 3 adenocarcinoma lung CA, Sjogren's, type 1 DM, ADHD, Hashimoto's thyroiditis, melanoma, chronic urinary retention on self-caths, and chronic constipation who presented with hemoptysis.  On palliative care, still receiving treatment but considering transition to hospice; DNR/DNI.  She was found to have post-obstructive PNA and started on Vancomycin /Cefepime  but patient declined antibiotics.  Pulmonology consulting.  Tolerating Cipro  well.  Plan is home with home hospice  Assessment and Plan:  Sepsis due to postobstructive pneumonia with hemoptysis Presented with blood-tinged sputum, cough, shortness of breath and fever for the last 3 days Known history of lung cancer, currently on palliative care and planning to transition to home hospice care MRSA negative, Vanc discontinued Tolerating CiproCancer-related pain Continue IV Dilaudid  as needed Palliative care consulted regarding possible transition to hospice Patient reports NOT needing pain medication at home normally   Stage III adenocarcinoma of the lung She decided not to pursue therapy for her cancer and is planning home hospice care Reportedly not a good candidate for immunotherapy due to underlying multiple autoimmune disease processes DNR/DNI    N/V Resolved  DM type I Hypoglycemia this AM, likely in the setting of persistent n/v throughout the day yesterday Improved, more stable today Chronic steroids -> chronic uncontrolled DM  -Lower dose of Semglee  and sliding scale insulin    History of rheumatoid arthritis/Sjogren disease Continue prednisone  5 mg twice daily   History of Hashimoto thyroiditis/Chronic hypothyroidism Continue levothyroxine     Acute on chronic hyponatremia Stable, at baseline   Hypocalcemia Repleted with  calcium  gluconate   History of IBS Continue Linzess    GERD Continue Protonix  daily and Maalox as needed   Neurogenic bladder History of neurogenic bladder and patient does self cath at home         Consultants: Pulmonology Palliative care     Subjective: Does not like taking oral medicines and would prefer patch for pain   Objective: Vitals:   03/25/24 0446 03/25/24 1154  BP: (!) 115/56 123/69  Pulse: 82 82  Resp: 17 20  Temp: 99.2 F (37.3 C) 99 F (37.2 C)  SpO2: 94% 94%    Intake/Output Summary (Last 24 hours) at 03/25/2024 1212 Last data filed at 03/25/2024 8119 Gross per 24 hour  Intake 357.8 ml  Output --  Net 357.8 ml   Filed Weights   03/23/24 1301  Weight: 58.7 kg    Exam:   General: Appearance:    Well developed, well nourished female in no acute distress     Lungs:     respirations unlabored  Heart:    Normal heart rate.    MS:   All extremities are intact.   Neurologic:   Awake, alert    Disposition: Status is: Inpatient Remains inpatient appropriate because: Home in the a.m?     Time spent: 50 minutes  Unresulted Labs (From admission, onward)     Start     Ordered   03/22/24 2337  Expectorated Sputum Assessment w Gram Stain, Rflx to Resp Cult  (COPD / Pneumonia / Cellulitis / Lower Extremity Wound (Diabetic Foot Infection))  Once,   R        03/22/24 2336             Author: Loc Feinstein U Tmya Wigington, DO 03/25/2024 12:12 PM  For on call review  [email or web address]

## 2024-03-26 ENCOUNTER — Other Ambulatory Visit (HOSPITAL_COMMUNITY): Payer: Self-pay

## 2024-03-26 LAB — GLUCOSE, CAPILLARY: Glucose-Capillary: 301 mg/dL — ABNORMAL HIGH (ref 70–99)

## 2024-03-26 MED ORDER — MORPHINE SULFATE (CONCENTRATE) 10 MG /0.5 ML PO SOLN
10.0000 mg | ORAL | 0 refills | Status: DC | PRN
  Filled 2024-03-26: qty 30, 5d supply, fill #0

## 2024-03-26 MED ORDER — LORAZEPAM 1 MG PO TABS
1.0000 mg | ORAL_TABLET | ORAL | 0 refills | Status: DC | PRN
  Filled 2024-03-26: qty 30, 5d supply, fill #0

## 2024-03-26 MED ORDER — CIPROFLOXACIN HCL 500 MG PO TABS
500.0000 mg | ORAL_TABLET | Freq: Two times a day (BID) | ORAL | Status: DC
  Administered 2024-03-26: 500 mg via ORAL
  Filled 2024-03-26: qty 1

## 2024-03-26 MED ORDER — FENTANYL 12 MCG/HR TD PT72
1.0000 | MEDICATED_PATCH | TRANSDERMAL | 0 refills | Status: DC
  Filled 2024-03-26: qty 5, 15d supply, fill #0

## 2024-03-26 MED ORDER — HYDROCODONE BIT-HOMATROP MBR 5-1.5 MG/5ML PO SOLN
5.0000 mL | ORAL | 0 refills | Status: DC | PRN
  Filled 2024-03-26: qty 120, 4d supply, fill #0

## 2024-03-26 MED ORDER — CIPROFLOXACIN HCL 500 MG PO TABS
500.0000 mg | ORAL_TABLET | Freq: Two times a day (BID) | ORAL | 0 refills | Status: DC
  Filled 2024-03-26: qty 6, 3d supply, fill #0

## 2024-03-26 MED ORDER — LEVEMIR FLEXTOUCH 100 UNIT/ML ~~LOC~~ SOPN: 8.0000 [IU] | PEN_INJECTOR | Freq: Two times a day (BID) | SUBCUTANEOUS | Status: DC

## 2024-03-26 NOTE — Discharge Instructions (Signed)
 Home O2-- can get a pulse oximeter to measure you O2 levels and heart rate

## 2024-03-26 NOTE — Discharge Summary (Signed)
 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  Diet recommendation:  Carbohydrate-modified.  Wound care: None.  Code status: Full.   History of Present Illness:   Mary Erickson is a 58 y.o. female with medical history significant of stage III adenocarcinoma and non-small cell lung cancer, Sjogren disease, DM type I, GERD, ADHD, rheumatoid arthritis on a steroid, Hashimoto thyroiditis, melanoma, chronic urinary retention on self-catheterization, chronic hepatitis B, and chronic constipation presented emergency department with multiple complaint include hemoptysis, dizziness, cough and fever for 3 days. Patient is also complaining about right upper chest pain hide the lung cancer is located. Patient has been recently seen by palliative care and patient has been wish to continue to treat all the treatable without invasive procedure.  And patient confirmed she wants to be DNR/DNI.  Patient stating she has been waiting for to get the nebulizer treatment at home which has been prescribed by palliative care   Patient reported that since last discharge from the hospital she  did not have any blood-tinged sputum however recently she has been coughing for last 1 week and last few days noticed blood-tinged sputum and associated shortness of breath.  Patient denies any frank hematemesis.     Presentation to ED patient is tachycardic, having low-grade temperature and borderline hypotensive.  O2 sat 100% room air. CBC showing leukocytosis 19.8 (chronic leukocytosis for last 2 months), stable H&H and elevated platelet count 441. CMP showing low sodium 131, low chloride 96, low calcium  7.9 and low albumin 2.9. Lactic acid within normal range. Respiratory panel in process.   CTA chest: No evidence of acute pulmonary embolism and rest of the findings are following: 1. Limited evaluation of the segmental and subsegmental pulmonary arteries, as described above, without definite evidence of acute pulmonary embolism. 2. Truncated upper lobe and middle lobe branches of the right pulmonary artery, likely secondary to the presence of the large right lung mass. 3. Very large, heterogeneous low-attenuation mass within the posterior aspect of the mid and upper right lung, as described above, with adjacent moderate severity anterior right upper lobe atelectasis and/or postobstructive infiltrate. 4. Small right pleural effusion. 5. Mild lingular and anteromedial right middle lobe linear scarring and/or atelectasis.     As chest CT showing postobstructive infiltrate concern for postobstructive pneumonia and in the ED patient has been treated with ceftriaxone , azithromycin , 1 L of LR bolus, Zofran  and Tylenol  1000 mg.   Hospitalist has been consulted for further management of sepsis due to postobstructive pneumonia.     Hospital Course by Problem:   Sepsis due to postobstructive pneumonia with hemoptysis Presented with blood-tinged sputum, cough, shortness  of breath and fever for the last 3 days Known history of lung cancer, currently on palliative care and planning to  transition to home hospice care MRSA negative, Vanc discontinued Tolerating Cipro -- finish course   Cancer-related pain -per palliative care    Stage III adenocarcinoma of the lung She decided not to pursue therapy for her cancer and is planning home hospice care Reportedly not a good candidate for immunotherapy due to underlying multiple autoimmune disease processes DNR/DNI     N/V Resolved   DM type I Patient to adjust meds as needed   History of rheumatoid arthritis/Sjogren disease Continue prednisone  5 mg twice daily   History of Hashimoto thyroiditis/Chronic hypothyroidism Continue levothyroxine     Acute on chronic hyponatremia Stable, at baseline   Hypocalcemia Repleted with calcium  gluconate   History of IBS Continue Linzess    GERD Continue Protonix  daily and Maalox as needed   Neurogenic bladder History of neurogenic bladder and patient does self cath at home      Medical Consultants:      Discharge Exam:   Vitals:   03/26/24 0605 03/26/24 0828  BP:    Pulse: 80   Resp:    Temp:    SpO2: 95% 96%   Vitals:   03/25/24 2014 03/26/24 0602 03/26/24 0605 03/26/24 0828  BP: 126/76 124/66    Pulse: 85 82 80   Resp: 17 18    Temp: 98.1 F (36.7 C) 98.2 F (36.8 C)    TempSrc: Oral Oral    SpO2: 96% 91% 95% 96%  Weight:      Height:        General exam: Appears calm and comfortable.   The results of significant diagnostics from this hospitalization (including imaging, microbiology, ancillary and laboratory) are listed below for reference.     Procedures and Diagnostic Studies:   CT Angio Chest Pulmonary Embolism (PE) W or WO Contrast Result Date: 03/22/2024 CLINICAL DATA:  Suspected pulmonary embolism. EXAM: CT ANGIOGRAPHY CHEST WITH CONTRAST TECHNIQUE: Multidetector CT imaging of the chest was performed using the standard protocol during bolus administration of intravenous contrast. Multiplanar CT image reconstructions and MIPs were  obtained to evaluate the vascular anatomy. RADIATION DOSE REDUCTION: This exam was performed according to the departmental dose-optimization program which includes automated exposure control, adjustment of the mA and/or kV according to patient size and/or use of iterative reconstruction technique. CONTRAST:  75mL OMNIPAQUE  IOHEXOL  350 MG/ML SOLN COMPARISON:  January 31, 2024 FINDINGS: Cardiovascular: The segmental and subsegmental pulmonary arteries are limited in evaluation secondary to suboptimal opacification with intravenous contrast and areas of overlying artifact. Truncated upper lobe and middle lobe branches of the right pulmonary artery are again seen. Acute pulmonary embolism is not clearly identified. There is mild cardiomegaly. No pericardial effusion. Mediastinum/Nodes: No enlarged mediastinal, hilar, or axillary lymph nodes. Thyroid  gland, trachea, and esophagus demonstrate no significant findings. Lungs/Pleura: A very large, approximately 12.3 cm x 10.2 cm x 12.0 cm heterogeneous low-attenuation mass is seen within the posterior aspect of the mid and upper right lung (measured 10.1 cm x 10.8 cm on the prior study). Adjacent, moderate severity anterior right upper lobe atelectasis and/or postobstructive infiltrate is seen. Mild lingular and anteromedial right middle lobe linear scarring and/or atelectasis is also present. There is a small right pleural effusion. No pneumothorax is identified. Upper Abdomen: No acute abnormality. Musculoskeletal: No chest wall abnormality. No acute or significant osseous findings. Review of the MIP images confirms the above findings. IMPRESSION: 1. Limited  evaluation of the segmental and subsegmental pulmonary arteries, as described above, without definite evidence of acute pulmonary embolism. 2. Truncated upper lobe and middle lobe branches of the right pulmonary artery, likely secondary to the presence of the large right lung mass. 3. Very large, heterogeneous  low-attenuation mass within the posterior aspect of the mid and upper right lung, as described above, with adjacent moderate severity anterior right upper lobe atelectasis and/or postobstructive infiltrate. 4. Small right pleural effusion. 5. Mild lingular and anteromedial right middle lobe linear scarring and/or atelectasis. Electronically Signed   By: Virgle Grime M.D.   On: 03/22/2024 21:46     Labs:   Basic Metabolic Panel: Recent Labs  Lab 03/22/24 1827 03/23/24 0504 03/23/24 1347 03/24/24 0513 03/25/24 0524  NA 131* 128* 131* 130* 130*  K 3.8 3.6 3.8 3.6 3.6  CL 96* 98 100 101 100  CO2 24 20* 23 21* 21*  GLUCOSE 179* 161* 142* 41* 50*  BUN 14 12 12 12 11   CREATININE 0.67 0.45 0.54 0.61 0.61  CALCIUM  7.9* 7.7* 8.4* 8.0* 8.3*   GFR Estimated Creatinine Clearance: 69 mL/min (by C-G formula based on SCr of 0.61 mg/dL). Liver Function Tests: Recent Labs  Lab 03/22/24 1827 03/23/24 0504  AST 41 34  ALT 14 13  ALKPHOS 122 75  BILITOT 0.4 1.1  PROT 6.6 4.6*  ALBUMIN 2.9* 1.9*   No results for input(s): "LIPASE", "AMYLASE" in the last 168 hours. No results for input(s): "AMMONIA" in the last 168 hours. Coagulation profile Recent Labs  Lab 03/23/24 0111  INR 1.1    CBC: Recent Labs  Lab 03/22/24 1827 03/23/24 0504 03/24/24 0513 03/25/24 0524  WBC 19.8* 14.5* 20.6* 22.0*  NEUTROABS 16.9*  --  17.3* 18.3*  HGB 10.0* 7.6* 8.8* 9.9*  HCT 32.1* 24.9* 28.3* 31.9*  MCV 88.7 90.9 90.4 90.4  PLT 441* 322 330 403*   Cardiac Enzymes: No results for input(s): "CKTOTAL", "CKMB", "CKMBINDEX", "TROPONINI" in the last 168 hours. BNP: Invalid input(s): "POCBNP" CBG: Recent Labs  Lab 03/25/24 0609 03/25/24 1150 03/25/24 1803 03/25/24 2331 03/26/24 0559  GLUCAP 72 111* 225* 170* 301*   D-Dimer No results for input(s): "DDIMER" in the last 72 hours. Hgb A1c No results for input(s): "HGBA1C" in the last 72 hours. Lipid Profile No results for input(s):  "CHOL", "HDL", "LDLCALC", "TRIG", "CHOLHDL", "LDLDIRECT" in the last 72 hours. Thyroid  function studies No results for input(s): "TSH", "T4TOTAL", "T3FREE", "THYROIDAB" in the last 72 hours.  Invalid input(s): "FREET3" Anemia work up No results for input(s): "VITAMINB12", "FOLATE", "FERRITIN", "TIBC", "IRON", "RETICCTPCT" in the last 72 hours. Microbiology Recent Results (from the past 240 hours)  Resp panel by RT-PCR (RSV, Flu A&B, Covid) Anterior Nasal Swab     Status: None   Collection Time: 03/22/24  7:10 PM   Specimen: Anterior Nasal Swab  Result Value Ref Range Status   SARS Coronavirus 2 by RT PCR NEGATIVE NEGATIVE Final    Comment: (NOTE) SARS-CoV-2 target nucleic acids are NOT DETECTED.  The SARS-CoV-2 RNA is generally detectable in upper respiratory specimens during the acute phase of infection. The lowest concentration of SARS-CoV-2 viral copies this assay can detect is 138 copies/mL. A negative result does not preclude SARS-Cov-2 infection and should not be used as the sole basis for treatment or other patient management decisions. A negative result may occur with  improper specimen collection/handling, submission of specimen other than nasopharyngeal swab, presence of viral mutation(s) within the areas targeted by  this assay, and inadequate number of viral copies(<138 copies/mL). A negative result must be combined with clinical observations, patient history, and epidemiological information. The expected result is Negative.  Fact Sheet for Patients:  BloggerCourse.com  Fact Sheet for Healthcare Providers:  SeriousBroker.it  This test is no t yet approved or cleared by the [place]  FDA and  has been authorized for detection and/or diagnosis of SARS-CoV-2 by FDA under an Emergency Use Authorization (EUA). This EUA will remain  in effect (meaning this test can be used) for the duration of the COVID-19 declaration  under Section 564(b)(1) of the Act, 21 U.S.C.section 360bbb-3(b)(1), unless the authorization is terminated  or revoked sooner.       Influenza A by PCR NEGATIVE NEGATIVE Final   Influenza B by PCR NEGATIVE NEGATIVE Final    Comment: (NOTE) The Xpert Xpress SARS-CoV-2/FLU/RSV plus assay is intended as an aid in the diagnosis of influenza from Nasopharyngeal swab specimens and should not be used as a sole basis for treatment. Nasal washings and aspirates are unacceptable for Xpert Xpress SARS-CoV-2/FLU/RSV testing.  Fact Sheet for Patients: BloggerCourse.com  Fact Sheet for Healthcare Providers: SeriousBroker.it  This test is not yet approved or cleared by the [place]  FDA and has been authorized for detection and/or diagnosis of SARS-CoV-2 by FDA under an Emergency Use Authorization (EUA). This EUA will remain in effect (meaning this test can be used) for the duration of the COVID-19 declaration under Section 564(b)(1) of the Act, 21 U.S.C. section 360bbb-3(b)(1), unless the authorization is terminated or revoked.     Resp Syncytial Virus by PCR NEGATIVE NEGATIVE Final    Comment: (NOTE) Fact Sheet for Patients: BloggerCourse.com  Fact Sheet for Healthcare Providers: SeriousBroker.it  This test is not yet approved or cleared by the [place]  FDA and has been authorized for detection and/or diagnosis of SARS-CoV-2 by FDA under an Emergency Use Authorization (EUA). This EUA will remain in effect (meaning this test can be used) for the duration of the COVID-19 declaration under Section 564(b)(1) of the Act, 21 U.S.C. section 360bbb-3(b)(1), unless the authorization is terminated or revoked.  Performed at [facility], [address], [place], [place] [address]   Culture, blood (routine x 2) Call MD if unable to obtain prior to antibiotics being  given     Status: None (Preliminary result)   Collection Time: 03/23/24  1:11 AM   Specimen: BLOOD  Result Value Ref Range Status   Specimen Description   Final    BLOOD BLOOD RIGHT ARM Performed at [facility], [address], [place], [place] [address]    Special Requests   Final    BOTTLES DRAWN AEROBIC AND ANAEROBIC Blood Culture results may not be optimal due to an inadequate volume of blood received in culture bottles Performed at [facility], [address] [place], [place] [address]    Culture   Final    NO GROWTH 2 DAYS Performed at [facility], [address], [place], [place] [address]    Report Status PENDING  Incomplete  Culture, blood (routine x 2) Call MD if unable to obtain prior to antibiotics being given     Status: None (Preliminary result)   Collection Time: 03/23/24  1:11 AM   Specimen: BLOOD  Result Value Ref Range Status   Specimen Description   Final    BLOOD BLOOD RIGHT ARM Performed at [facility], [address] [place], [place]  [address]    Special Requests   Final    BOTTLES DRAWN AEROBIC AND ANAEROBIC Blood Culture results may not be optimal due to an inadequate volume of blood received in culture bottles Performed at [facility], [address], [place], [place] [address]    Culture   Final    NO GROWTH 2 DAYS Performed at [facility], [address], [place], [place] [address]    Report Status PENDING  Incomplete  MRSA Next Gen by PCR, Nasal     Status: None   Collection Time: 03/23/24  9:10 AM   Specimen: Nasal Mucosa; Nasal Swab  Result Value Ref Range Status   MRSA by PCR Next Gen NOT DETECTED NOT DETECTED Final    Comment: (NOTE) The GeneXpert MRSA Assay (FDA approved for NASAL specimens only), is one component of a comprehensive MRSA colonization surveillance program. It is not intended to diagnose MRSA infection nor to guide or monitor  treatment for MRSA infections. Test performance is not FDA approved in patients less than 57 years old. Performed at [facility], [address] [place], [place] [address]      Discharge Instructions:   Discharge Instructions     Diet - low sodium heart healthy   Complete by: As directed    Diet Carb Modified   Complete by: As directed    Discharge instructions   Complete by: As directed    While taking hycodan do not use the morphine  liquid at the same time   Increase activity slowly   Complete by: As directed       Allergies as of 03/26/2024       Reactions   Doxycycline Other (See Comments)   Elevates liver enzymes   Aspirin  Other (See Comments)   Can take 81 mg but has to be coated; No other Aspirin  due to bleeding in stools from ulcers   Nsaids Other (See Comments)   GI issues    Amoxicillin -pot Clavulanate Nausea Only   Gluten Meal    Avoid due to autoimmune issues    Lactose    Avoid due to autoimmune issues    Codeine Nausea And Vomiting   Hydrocodone -acetaminophen  Nausea And Vomiting   Oxycodone  Nausea And Vomiting   Sulfa Antibiotics Nausea And Vomiting   Sulfasalazine Nausea And Vomiting        Medication List     TAKE these medications    Accu-Chek Guide test strip Generic drug: glucose blood 1 each by Other route as needed.   acetaminophen  500 MG tablet Commonly known as: TYLENOL  Take 1,000 mg by mouth every 6 (six) hours as needed for moderate pain.   Advair  HFA 115-21 MCG/ACT inhaler Generic drug: fluticasone-salmeterol Inhale 2 puffs into the lungs 2 (two) times daily.   B-12 3000 MCG Caps Take 3,000 mcg by mouth daily.   BENADRYL  ALLERGY PO Take 25 mg by mouth at bedtime.   benzocaine  10 % mucosal gel Commonly known as: ORAJEL Use as directed 1 Application in the mouth or throat as needed for mouth pain.   ciprofloxacin  500 MG tablet Commonly known as: CIPRO  Take 1 tablet (500 mg total) by mouth 2 (two)  times daily.   [employer] ALLERGY EYE DROPS OP Place 1 drop into both eyes 2 (two) times daily.   Dexcom G7 Sensor Misc Inject 1 applicator into the skin See admin instructions. Every 10 days   diclofenac  Sodium 1 % Gel Commonly known as: VOLTAREN   Apply 2 g topically 4 (four) times daily. What changed:  when to take this reasons to take this   fentaNYL  12 MCG/HR Commonly known as: DURAGESIC  Place 1 patch onto the skin every 3 (three) days. Start taking on: Mar 28, 2024   HYDROcodone  bit-homatropine 5-1.5 MG/5ML syrup Commonly known as: HYCODAN Take 5 mLs by mouth every 4 (four) hours as needed for cough.   ibuprofen  400 MG tablet Commonly known as: ADVIL  Take 400 mg by mouth every 6 (six) hours as needed for moderate pain (pain score 4-6).   Insulin  lispro 100 UNIT/ML Commonly known as: HUMALOG Junior KwikPen Inject 1-7 Units into the skin as needed (high blood sugar).   Levemir  FlexTouch 100 UNIT/ML FlexTouch Pen Generic drug: insulin  detemir Inject 8 Units into the skin 2 (two) times daily. What changed:  how much to take Another medication with the same name was removed. Continue taking this medication, and follow the directions you see here.   levothyroxine  150 MCG tablet Commonly known as: SYNTHROID  Take 150 mcg by mouth every evening.   linaclotide  145 MCG Caps capsule Commonly known as: Linzess  Take 1 capsule (145 mcg total) by mouth daily before breakfast.   LORazepam 1 MG tablet Commonly known as: ATIVAN Take 1 tablet (1 mg total) by mouth every 4 (four) hours as needed for anxiety or sleep (or nausea).   Melatonin 10-10 MG Tbcr Take 10 mg by mouth at bedtime.   methylphenidate  36 MG CR tablet Commonly known as: CONCERTA  Take 36 mg by mouth every morning.   morphine  CONCENTRATE 10 mg / 0.5 ml concentrated solution Place 0.5 mLs (10 mg total) under the tongue every 2 (two) hours as needed for moderate pain (pain score 4-6), severe pain (pain score 7-10),  anxiety or shortness of breath (breakthrough pain).   multivitamins ther. w/minerals Tabs tablet Take 1 tablet by mouth daily.   nystatin cream Commonly known as: MYCOSTATIN Apply 1 Application topically 2 (two) times daily as needed for dry skin.   omeprazole  20 MG capsule Commonly known as: PRILOSEC Take 1 capsule (20 mg total) by mouth 2 (two) times daily before a meal.   ondansetron  4 MG disintegrating tablet Commonly known as: Zofran  ODT Take 1 tablet (4 mg total) by mouth every 6 (six) hours as needed for nausea or vomiting.   polyethylene glycol powder 17 GM/SCOOP powder Commonly known as: GLYCOLAX /MIRALAX  Take 17 g by mouth daily as needed for moderate constipation.   predniSONE  5 MG tablet Commonly known as: DELTASONE  Take 1 tablet (5 mg total) by mouth 2 (two) times daily.   SYSTANE OP Place 1 drop into both eyes 3 (three) times daily as needed (dry eyes).   Vitamin D3 125 MCG (5000 UT) Caps Take 5,000 Units by mouth daily.          Time coordinating discharge: 45 min  Signed:  Enrigue Harvard DO  [facility] 03/26/2024, 8:56 AM

## 2024-03-26 NOTE — TOC Transition Note (Signed)
 Transition of Care [facility]) - Discharge Note   Patient Details  Name: Mary Erickson MRN: [id number] Date of Birth: 1965/11/30  Transition of Care [facility]) CM/SW Contact:  Ruben Corolla, RN Phone Number: 03/26/2024, 9:59 AM   Clinical Narrative: [facility] care accepted for home w/hospice. Patient has home 02 travel tank in rm. Please provide meds to bed. patient has own transport home. No further CM needs       Final next level of care: Home w Hospice Care Barriers to Discharge: No Barriers Identified   Patient Goals and CMS Choice Patient states their goals for this hospitalization and ongoing recovery are:: Home w/hospice CMS Medicare.gov Compare Post Acute Care list provided to:: Patient Choice offered to / list presented to : Patient [place] ownership interest in [facility].provided to:: Patient    Discharge Placement                       Discharge Plan and Services Additional resources added to the After Visit Summary for     Discharge Planning Services: CM Consult                      HH Arranged: RN [facility] Agency: Other - See comment Lennart Quitter) Date HH Agency Contacted: 03/25/24 Time HH Agency Contacted: [phone] Representative spoke with at [facility] Agency: Melissa  Social Drivers of Health (SDOH) Interventions SDOH Screenings   Food Insecurity: No Food Insecurity (03/23/2024)  Housing: Low Risk  (03/23/2024)  Transportation Needs: No Transportation Needs (03/23/2024)  Utilities: Not At Risk (03/23/2024)  Depression (PHQ2-9): Medium Risk (06/30/2019)  Financial Resource Strain: Low Risk  (02/13/2024)   Received from [facility]  Physical Activity: Unknown (08/13/2023)   Received from [facility]  Social Connections: Unknown (07/26/2023)   Received from [facility]  Stress: Patient Declined (08/13/2023)   Received from [facility]  Tobacco Use: Medium Risk (03/22/2024)     Readmission Risk Interventions    03/23/2024   12:39 PM  12/09/2023    8:24 AM  Readmission Risk Prevention Plan  Transportation Screening Complete Complete  PCP or Specialist Appt within 5-7 Days  Complete  PCP or Specialist Appt within 3-5 Days Complete   Home Care Screening  Complete  Medication Review (RN CM)  Complete  HRI or Home Care Consult Complete   Social Work Consult for Recovery Care Planning/Counseling Complete   Palliative Care Screening Complete   Medication Review [employer]) Complete

## 2024-03-26 NOTE — TOC Benefit Eligibility Note (Signed)
 Pharmacy Patient Advocate Encounter   Received notification from Inpatient Request that prior authorization for Fentanyl  76mcg/72hr patch is required/requested.   Insurance verification completed.   The patient is insured through [facility] .   Per test claim: PA required; PA submitted to above mentioned insurance via CoverMyMeds Key/confirmation #/EOC [facility] Status is pending     Received notification from [facility] that Prior Authorization for Fentanyl  93mcg/72hr patch has been APPROVED from 03/26/24 to 06/24/24   PA #/Case ID/Reference #: [id number]

## 2024-03-26 NOTE — TOC Benefit Eligibility Note (Signed)
 Pharmacy Patient Advocate Encounter   Received notification from Inpatient Request that prior authorization for Morphine  Concentrate 100mg /91ml is required/requested.   Insurance verification completed.   The patient is insured through [facility] .   Per test claim: PA required; PA submitted to above mentioned insurance via CoverMyMeds Key/confirmation #/EOC [id number] Status is pending     Received notification from [facility] that Prior Authorization for Morphine  Concentrate 100mg /48ml has been APPROVED from 03/26/24 to 09/22/24   PA #/Case ID/Reference #: [id number]

## 2024-03-26 NOTE — Plan of Care (Signed)

## 2024-03-26 NOTE — Progress Notes (Signed)
 Discharge instructions given to patient questions asked and answered, discharge medications delivered to bedside [facility]

## 2024-03-28 LAB — CULTURE, BLOOD (ROUTINE X 2)
Culture: NO GROWTH
Culture: NO GROWTH

## 2024-04-08 ENCOUNTER — Emergency Department (HOSPITAL_COMMUNITY)

## 2024-04-08 ENCOUNTER — Other Ambulatory Visit: Payer: Self-pay

## 2024-04-08 ENCOUNTER — Encounter (HOSPITAL_COMMUNITY): Payer: Self-pay | Admitting: Emergency Medicine

## 2024-04-08 ENCOUNTER — Observation Stay (HOSPITAL_COMMUNITY)
Admission: EM | Admit: 2024-04-08 | Discharge: 2024-04-10 | Disposition: A | Discharge status: Home / Self Care | Attending: Internal Medicine | Admitting: Internal Medicine

## 2024-04-08 DIAGNOSIS — Z87448 Personal history of other diseases of urinary system: Secondary | ICD-10-CM | POA: Diagnosis not present

## 2024-04-08 DIAGNOSIS — D72829 Elevated white blood cell count, unspecified: Secondary | ICD-10-CM | POA: Insufficient documentation

## 2024-04-08 DIAGNOSIS — C3411 Malignant neoplasm of upper lobe, right bronchus or lung: Secondary | ICD-10-CM | POA: Diagnosis present

## 2024-04-08 DIAGNOSIS — F909 Attention-deficit hyperactivity disorder, unspecified type: Secondary | ICD-10-CM | POA: Insufficient documentation

## 2024-04-08 DIAGNOSIS — R1013 Epigastric pain: Secondary | ICD-10-CM | POA: Diagnosis not present

## 2024-04-08 DIAGNOSIS — R1310 Dysphagia, unspecified: Secondary | ICD-10-CM | POA: Diagnosis not present

## 2024-04-08 DIAGNOSIS — E039 Hypothyroidism, unspecified: Secondary | ICD-10-CM | POA: Diagnosis not present

## 2024-04-08 DIAGNOSIS — E871 Hypo-osmolality and hyponatremia: Secondary | ICD-10-CM | POA: Insufficient documentation

## 2024-04-08 DIAGNOSIS — Z7982 Long term (current) use of aspirin: Secondary | ICD-10-CM | POA: Diagnosis not present

## 2024-04-08 DIAGNOSIS — E1140 Type 2 diabetes mellitus with diabetic neuropathy, unspecified: Secondary | ICD-10-CM | POA: Insufficient documentation

## 2024-04-08 DIAGNOSIS — R10816 Epigastric abdominal tenderness: Secondary | ICD-10-CM | POA: Diagnosis not present

## 2024-04-08 DIAGNOSIS — Z794 Long term (current) use of insulin: Secondary | ICD-10-CM | POA: Diagnosis not present

## 2024-04-08 DIAGNOSIS — Z85118 Personal history of other malignant neoplasm of bronchus and lung: Secondary | ICD-10-CM | POA: Insufficient documentation

## 2024-04-08 DIAGNOSIS — K5900 Constipation, unspecified: Secondary | ICD-10-CM | POA: Diagnosis not present

## 2024-04-08 DIAGNOSIS — M069 Rheumatoid arthritis, unspecified: Secondary | ICD-10-CM | POA: Diagnosis not present

## 2024-04-08 DIAGNOSIS — D649 Anemia, unspecified: Secondary | ICD-10-CM | POA: Insufficient documentation

## 2024-04-08 DIAGNOSIS — Z87891 Personal history of nicotine dependence: Secondary | ICD-10-CM | POA: Insufficient documentation

## 2024-04-08 DIAGNOSIS — E109 Type 1 diabetes mellitus without complications: Secondary | ICD-10-CM | POA: Diagnosis present

## 2024-04-08 DIAGNOSIS — R112 Nausea with vomiting, unspecified: Secondary | ICD-10-CM | POA: Diagnosis not present

## 2024-04-08 DIAGNOSIS — D75839 Thrombocytosis, unspecified: Secondary | ICD-10-CM | POA: Diagnosis not present

## 2024-04-08 DIAGNOSIS — E860 Dehydration: Secondary | ICD-10-CM | POA: Diagnosis not present

## 2024-04-08 DIAGNOSIS — R0789 Other chest pain: Secondary | ICD-10-CM | POA: Diagnosis present

## 2024-04-08 HISTORY — DX: Nausea with vomiting, unspecified: R11.2

## 2024-04-08 LAB — CBC
HCT: 37.7 % (ref 36.0–46.0)
Hemoglobin: 11.7 g/dL — ABNORMAL LOW (ref 12.0–15.0)
MCH: 27 pg (ref 26.0–34.0)
MCHC: 31 g/dL (ref 30.0–36.0)
MCV: 87.1 fL (ref 80.0–100.0)
Platelets: 579 10*3/uL — ABNORMAL HIGH (ref 150–400)
RBC: 4.33 MIL/uL (ref 3.87–5.11)
RDW: 14.9 % (ref 11.5–15.5)
WBC: 14.2 10*3/uL — ABNORMAL HIGH (ref 4.0–10.5)
nRBC: 0 % (ref 0.0–0.2)

## 2024-04-08 LAB — COMPREHENSIVE METABOLIC PANEL WITH GFR
ALT: 20 U/L (ref 0–44)
AST: 33 U/L (ref 15–41)
Albumin: 3.1 g/dL — ABNORMAL LOW (ref 3.5–5.0)
Alkaline Phosphatase: 116 U/L (ref 38–126)
Anion gap: 11 (ref 5–15)
BUN: 12 mg/dL (ref 6–20)
CO2: 22 mmol/L (ref 22–32)
Calcium: 8.5 mg/dL — ABNORMAL LOW (ref 8.9–10.3)
Chloride: 95 mmol/L — ABNORMAL LOW (ref 98–111)
Creatinine, Ser: 0.7 mg/dL (ref 0.44–1.00)
GFR, Estimated: >60 mL/min (ref >60)
Glucose, Bld: 198 mg/dL — ABNORMAL HIGH (ref 70–99)
Potassium: 4 mmol/L (ref 3.5–5.1)
Sodium: 128 mmol/L — ABNORMAL LOW (ref 135–145)
Total Bilirubin: 0.7 mg/dL (ref 0.0–1.2)
Total Protein: 7.1 g/dL (ref 6.5–8.1)

## 2024-04-08 LAB — URINALYSIS, ROUTINE W REFLEX MICROSCOPIC
Bilirubin Urine: NEGATIVE
Glucose, UA: NEGATIVE mg/dL
Hgb urine dipstick: NEGATIVE
Ketones, ur: 5 mg/dL — AB
Leukocytes,Ua: NEGATIVE
Nitrite: NEGATIVE
Protein, ur: NEGATIVE mg/dL
Specific Gravity, Urine: 1.003 — ABNORMAL LOW (ref 1.005–1.030)
pH: 7 (ref 5.0–8.0)

## 2024-04-08 LAB — LACTIC ACID, PLASMA
Lactic Acid, Venous: 1.8 mmol/L (ref 0.5–1.9)
Lactic Acid, Venous: 0.9 mmol/L (ref 0.5–1.9)

## 2024-04-08 LAB — LIPASE, BLOOD: Lipase: 25 U/L (ref 11–51)

## 2024-04-08 LAB — TROPONIN I (HIGH SENSITIVITY)
Troponin I (High Sensitivity): 3 ng/L (ref <18)
Troponin I (High Sensitivity): 3 ng/L (ref <18)

## 2024-04-08 LAB — CBG MONITORING, ED: Glucose-Capillary: 199 mg/dL — ABNORMAL HIGH (ref 70–99)

## 2024-04-08 MED ORDER — ACETAMINOPHEN 325 MG PO TABS
650.0000 mg | ORAL_TABLET | Freq: Four times a day (QID) | ORAL | Status: DC | PRN
  Administered 2024-04-09 – 2024-04-10 (×3): 650 mg via ORAL
  Filled 2024-04-08 (×3): qty 2

## 2024-04-08 MED ORDER — MORPHINE SULFATE (PF) 2 MG/ML IV SOLN: 1.0000 mg | INTRAVENOUS | Status: DC | PRN

## 2024-04-08 MED ORDER — PREDNISONE 5 MG PO TABS
5.0000 mg | ORAL_TABLET | Freq: Two times a day (BID) | ORAL | Status: DC
  Administered 2024-04-09 – 2024-04-10 (×4): 5 mg via ORAL
  Filled 2024-04-08 (×5): qty 1

## 2024-04-08 MED ORDER — MELATONIN 10-10 MG PO TBCR: 10.0000 mg | EXTENDED_RELEASE_TABLET | Freq: Every day | ORAL | Status: DC

## 2024-04-08 MED ORDER — ONDANSETRON HCL 4 MG/2ML IJ SOLN
4.0000 mg | Freq: Four times a day (QID) | INTRAMUSCULAR | Status: DC | PRN
  Administered 2024-04-09 (×2): 4 mg via INTRAVENOUS
  Filled 2024-04-08 (×2): qty 2

## 2024-04-08 MED ORDER — SODIUM CHLORIDE 0.9 % IV SOLN: INTRAVENOUS | Status: AC

## 2024-04-08 MED ORDER — INSULIN DETEMIR 100 UNIT/ML FLEXPEN: 8.0000 [IU] | PEN_INJECTOR | Freq: Two times a day (BID) | SUBCUTANEOUS | Status: DC

## 2024-04-08 MED ORDER — SODIUM CHLORIDE 0.9 % IV BOLUS
1000.0000 mL | Freq: Once | INTRAVENOUS | Status: AC
  Administered 2024-04-08: 1000 mL via INTRAVENOUS

## 2024-04-08 MED ORDER — LINACLOTIDE 145 MCG PO CAPS
145.0000 ug | ORAL_CAPSULE | Freq: Every day | ORAL | Status: DC
  Administered 2024-04-09 – 2024-04-10 (×2): 145 ug via ORAL
  Filled 2024-04-08 (×2): qty 1

## 2024-04-08 MED ORDER — ACETAMINOPHEN 650 MG RE SUPP
650.0000 mg | Freq: Four times a day (QID) | RECTAL | Status: DC | PRN
  Administered 2024-04-09: 650 mg via RECTAL
  Filled 2024-04-08: qty 1

## 2024-04-08 MED ORDER — INSULIN ASPART 100 UNIT/ML IJ SOLN
0.0000 [IU] | INTRAMUSCULAR | Status: DC
  Administered 2024-04-09: 1 [IU] via SUBCUTANEOUS
  Administered 2024-04-09: 2 [IU] via SUBCUTANEOUS

## 2024-04-08 MED ORDER — MORPHINE SULFATE (PF) 2 MG/ML IV SOLN
1.0000 mg | Freq: Once | INTRAVENOUS | Status: AC | PRN
  Administered 2024-04-09: 1 mg via INTRAVENOUS
  Filled 2024-04-08: qty 1

## 2024-04-08 MED ORDER — ENOXAPARIN SODIUM 40 MG/0.4ML IJ SOSY
40.0000 mg | PREFILLED_SYRINGE | INTRAMUSCULAR | Status: DC
  Administered 2024-04-09 – 2024-04-10 (×2): 40 mg via SUBCUTANEOUS
  Filled 2024-04-08 (×2): qty 0.4

## 2024-04-08 MED ORDER — SENNOSIDES-DOCUSATE SODIUM 8.6-50 MG PO TABS: 1.0000 | ORAL_TABLET | Freq: Every day | ORAL | Status: DC

## 2024-04-08 MED ORDER — LEVOTHYROXINE SODIUM 50 MCG PO TABS
150.0000 ug | ORAL_TABLET | Freq: Every evening | ORAL | Status: DC
  Administered 2024-04-09: 150 ug via ORAL
  Filled 2024-04-08: qty 1

## 2024-04-08 MED ORDER — METHYLPHENIDATE HCL ER (OSM) 36 MG PO TBCR: 36.0000 mg | EXTENDED_RELEASE_TABLET | Freq: Every morning | ORAL | Status: DC

## 2024-04-08 MED ORDER — ONDANSETRON HCL 4 MG/2ML IJ SOLN
4.0000 mg | Freq: Once | INTRAMUSCULAR | Status: AC
  Administered 2024-04-08: 4 mg via INTRAVENOUS
  Filled 2024-04-08: qty 2

## 2024-04-08 MED ORDER — ACETAMINOPHEN 325 MG PO TABS
650.0000 mg | ORAL_TABLET | Freq: Once | ORAL | Status: AC
  Administered 2024-04-08: 650 mg via ORAL
  Filled 2024-04-08: qty 2

## 2024-04-08 MED ORDER — NALOXONE HCL 0.4 MG/ML IJ SOLN: 0.4000 mg | INTRAMUSCULAR | Status: DC | PRN

## 2024-04-08 MED ORDER — IOHEXOL 300 MG/ML  SOLN
100.0000 mL | Freq: Once | INTRAMUSCULAR | Status: AC | PRN
  Administered 2024-04-08: 100 mL via INTRAVENOUS

## 2024-04-08 MED ORDER — PANTOPRAZOLE SODIUM 40 MG IV SOLR
40.0000 mg | Freq: Two times a day (BID) | INTRAVENOUS | Status: DC
  Administered 2024-04-09 – 2024-04-10 (×4): 40 mg via INTRAVENOUS
  Filled 2024-04-08 (×4): qty 10

## 2024-04-08 MED ORDER — POLYETHYLENE GLYCOL 3350 17 G PO PACK: 17.0000 g | PACK | Freq: Every day | ORAL | Status: DC | PRN

## 2024-04-08 NOTE — H&P (Incomplete)
 History and Physical    Mary Erickson ZOX:[id number] DOB: Dec 17, 1965 DOA: 04/08/2024  PCP: Nita Bast, NP  Patient coming from: Home  Chief Complaint: Epigastric abdominal pain  HPI: Mary Erickson is a 58 y.o. female with medical history significant of stage III adenocarcinoma of the lung, Sjogren disease, type 1 diabetes, GERD, ADHD, rheumatoid arthritis, Hashimoto's thyroiditis, hypothyroidism, hyperlipidemia, internal carotid aneurysm, left eye blindness, migraine headaches, seizures, hyponatremia, IBS, melanoma, neurogenic bladder does self-catheterization, hepatitis B, chronic constipation.  Patient was recently admitted 5/11-5/15 for sepsis due to postobstructive pneumonia with hemoptysis.  Patient decided not to pursue therapy for her cancer.  Reportedly not a good candidate for immunotherapy due to underlying multiple autoimmune diseases.  Currently on palliative care and planning to transition to home hospice care.  Patient is presenting today with complaints of epigastric abdominal pain, nausea, and vomiting.  States she was recently admitted to the hospital for pneumonia and had a coughing fit for 2 days.  Cough has now subsided and no longer having shortness of breath.  However, for the past 2 days she is having epigastric abdominal pain, nausea, and vomiting.  She has not been able to take her home medications due to vomiting.  Patient thinks her symptoms are related to hiatal hernia which she was diagnosed with a year ago.  In addition, having severe acid reflux.  She is also constipated but did have bowel movements for the past 2 days.  Denies fevers.  Denies any chest pain/pressure or pleuritic chest pain.  Denies any dysphagia or odynophagia.  She has been able to drink sips of water in the ED.  Patient states she was told she was not a candidate for surgery for her lung cancer, as such, she had decided not to pursue chemotherapy or radiation.  Also she was told that she  has not a good candidate for immunotherapy due to underlying multiple autoimmune diseases.  ED Course: EMS gave 300 mL normal saline and Zofran  4 mg.  Vital signs stable in the ED.  Labs showing WBC count 14.2, hemoglobin 11.7 (stable), platelet count 579k, sodium 128, chloride 95, glucose 198, creatinine 0.7, normal lipase and LFTs, troponin negative x 2, lactic acid normal x 2, UA not suggestive of infection.  CT chest abdomen pelvis with contrast showing no central PE but limited evaluation more distally due to artifact with questionable developing right upper lobe segmental PE.  No CT evidence of right heart strain or pulmonary infarction.  Also showing grossly stable in size 12 x 12 cm centrally necrotic heterogeneous right upper lobe mass with associated mass effect on the right mainstem bronchi.  Also showing stool throughout the colon consistent with constipation. Patient was given Tylenol , Zofran , and 2 L normal saline.  Review of Systems:  Review of Systems  All other systems reviewed and are negative.   Past Medical History:  Diagnosis Date  . Acid reflux   . ADHD (attention deficit hyperactivity disorder)   . Anemia   . Arthritis    "elbows, hands, neck, shoulders" (10/02/2016)  . Calcium  blood increased   . Chronic neck pain   . Chronic UTI (urinary tract infection)    "from self caths" (10/02/2016)  . Hashimoto's disease   . Hepatitis B    acute hepatitis B from lancet  . History of alcoholism (HCC)    10/02/2016 "sober since 03/14/2005"  . History of blood transfusion 1984; 1989; 2008   "quite a few"  . History of shortness  of breath   . Hypercholesteremia   . Hypothyroidism    no meds  . Internal carotid aneurysm dx'd early 2017  . Legally blind in left eye, as defined in [place]    . Melanoma (HCC)    left upper arm  . Migraines    "quite a few in a month; at least 15" (10/02/2016)  . Neuropathy    neck; "not sure if it's from DM or Sjogren's" (10/02/2016)  .  Pneumonia 1986   "1 yr after major OR when I was under anesthesia for 13 hr"  . Seizures (HCC) ~ 2004; ~ 2013   "seizures from diabetes "  . Self-catheterizes urinary bladder    "since OR in 1984" (10/02/2016)  . Sjogren's syndrome (HCC)   . Thyroid  goiter    "still present" (10/02/2016)  . Type II diabetes mellitus (HCC)   . Urinary retention    self caths  . Wears glasses     Past Surgical History:  Procedure Laterality Date  . ABDOMINAL HYSTERECTOMY  2008  . BILATERAL CARPAL TUNNEL RELEASE Bilateral   . BRONCHIAL WASHINGS  12/10/2023   Procedure: BRONCHIAL WASHINGS;  Surgeon: Wilfredo Hanly, MD;  Location: [facility];  Service: Pulmonary;;  . ECTOPIC PREGNANCY SURGERY  1989  . ENDOBRONCHIAL ULTRASOUND N/A 12/10/2023   Procedure: ENDOBRONCHIAL ULTRASOUND;  Surgeon: Wilfredo Hanly, MD;  Location: [facility];  Service: Pulmonary;  Laterality: N/A;  . EXCISION MELANOMA WITH SENTINEL LYMPH NODE BIOPSY Left 10/02/2016   Procedure: WIDE LOCAL EXCISION AND ADVANCEMENT FLAP CLOSURE LEFT UPPER  MELANOMA WITH SENTINEL LYMPH NODE MAPPING AND BIOPSY;  Surgeon: Lockie Rima, MD;  Location: [facility];  Service: General;  Laterality: Left;  . EYE SURGERY    . FINE NEEDLE ASPIRATION  12/10/2023   Procedure: FINE NEEDLE ASPIRATION (FNA) LINEAR;  Surgeon: Wilfredo Hanly, MD;  Location: [facility];  Service: Pulmonary;;  . LAPAROSCOPIC CHOLECYSTECTOMY  ~ 01/2016  . MELANOMA EXCISION WITH SENTINEL LYMPH NODE BIOPSY Left 10/02/2016   WIDE LOCAL EXCISION AND ADVANCEMENT FLAP CLOSURE LEFT UPPER  MELANOMA WITH SENTINEL LYMPH NODE MAPPING AND BIOPSY  . OVARIAN CYST REMOVAL    . SHOULDER ARTHROSCOPY W/ ROTATOR CUFF REPAIR Right 2014   "partial"  . STRABISMUS SURGERY Left 1971  . STRABISMUS SURGERY Left 03/18/2020   Procedure: STRABISMUS REPAIR;  Surgeon: Dorothey Gate, MD;  Location: [facility];  Service: Ophthalmology;  Laterality: Left;  . THYROIDECTOMY N/A 03/01/2023    Procedure: TOTAL THYROIDECTOMY;  Surgeon: Oralee Billow, MD;  Location: [facility];  Service: General;  Laterality: N/A;  . TUMOR EXCISION  1984   from tail bone and part of tail bone removed  . VIDEO BRONCHOSCOPY N/A 12/10/2023   Procedure: VIDEO BRONCHOSCOPY WITHOUT FLUORO;  Surgeon: Wilfredo Hanly, MD;  Location: [facility];  Service: Pulmonary;  Laterality: N/A;     reports that she quit smoking about 17 years ago. Her smoking use included cigarettes. She started smoking about 41 years ago. She has a 36 pack-year smoking history. She has never used smokeless tobacco. She reports that she does not currently use drugs after having used the following drugs: Marijuana. She reports that she does not drink alcohol.  Allergies  Allergen Reactions  . Doxycycline Other (See Comments)    Elevates liver enzymes   . Aspirin  Other (See Comments)    Can take 81 mg but has to be coated; No other Aspirin  due to bleeding in stools from ulcers  . Nsaids  Other (See Comments)    GI issues   . Amoxicillin -Pot Clavulanate Nausea Only  . Gluten Meal     Avoid due to autoimmune issues   . Lactose     Avoid due to autoimmune issues   . Codeine Nausea And Vomiting  . Hydrocodone -Acetaminophen  Nausea And Vomiting  . Oxycodone  Nausea And Vomiting  . Sulfa Antibiotics Nausea And Vomiting  . Sulfasalazine Nausea And Vomiting    Family History  Problem Relation Age of Onset  . Multiple sclerosis Father   . Colon cancer Neg Hx   . Stomach cancer Neg Hx   . Colon polyps Neg Hx   . Esophageal cancer Neg Hx   . Rectal cancer Neg Hx     Prior to Admission medications   Medication Sig Start Date End Date Taking? Authorizing Provider  acetaminophen  (TYLENOL ) 500 MG tablet Take 1,000 mg by mouth every 6 (six) hours as needed for moderate pain.    [provider]  benzocaine  (ORAJEL) 10 % mucosal gel Use as directed 1 Application in the mouth or throat as needed for mouth pain.    [provider]  Cholecalciferol  (VITAMIN D3) 125 MCG (5000 UT) capsule Take 5,000 Units by mouth daily.    [provider]  ciprofloxacin  (CIPRO ) 500 MG tablet Take 1 tablet (500 mg total) by mouth 2 (two) times daily. 03/26/24   Vann, Jessica U, DO  Continuous Glucose Sensor (DEXCOM G7 SENSOR) MISC Inject 1 applicator into the skin See admin instructions. Every 10 days 11/27/23   [provider]  Cyanocobalamin  (B-12) 3000 MCG CAPS Take 3,000 mcg by mouth daily.    [provider]  diclofenac  Sodium (VOLTAREN ) 1 % GEL Apply 2 g topically 4 (four) times daily. Patient taking differently: Apply 2 g topically 4 (four) times daily as needed (pain). 12/17/19   Darr, Jacob, PA-C  diphenhydrAMINE  HCl (BENADRYL  ALLERGY PO) Take 25 mg by mouth at bedtime.    [provider]  fentaNYL  (DURAGESIC ) 12 MCG/HR Place 1 patch onto the skin every 3 (three) days. 03/28/24   Vann, Jessica U, DO  fluticasone-salmeterol (ADVAIR  HFA) 115-21 MCG/ACT inhaler Inhale 2 puffs into the lungs 2 (two) times daily. 01/29/24   Wilfredo Hanly, MD  glucose blood (ACCU-CHEK GUIDE) test strip 1 each by Other route as needed. 06/05/23   [provider]  HYDROcodone  bit-homatropine (HYCODAN) 5-1.5 MG/5ML syrup Take 5 mLs by mouth every 4 (four) hours as needed for cough. 03/26/24   Vann, Jessica U, DO  ibuprofen  (ADVIL ) 400 MG tablet Take 400 mg by mouth every 6 (six) hours as needed for moderate pain (pain score 4-6).    [provider]  insulin  detemir (LEVEMIR  FLEXTOUCH) 100 UNIT/ML FlexPen Inject 8 Units into the skin 2 (two) times daily. 03/26/24   Vann, Jessica U, DO  Insulin  lispro (HUMALOG JUNIOR KWIKPEN) 100 UNIT/ML Inject 1-7 Units into the skin as needed (high blood sugar).    [provider]  Ketotifen  Fumarate ([employer] ALLERGY EYE DROPS OP) Place 1 drop into both eyes 2 (two) times daily.    [provider]  levothyroxine  (SYNTHROID ) 150 MCG tablet Take 150 mcg  by mouth every evening.    [provider]  linaclotide  (LINZESS ) 145 MCG CAPS capsule Take 1 capsule (145 mcg total) by mouth daily before breakfast. 11/14/23   Daina Drum, MD  LORazepam  (ATIVAN ) 1 MG tablet Take 1 tablet (1 mg total) by mouth every 4 (four)  hours as needed for anxiety or sleep (or nausea). 03/26/24   Vann, Jessica U, DO  Melatonin 10-10 MG TBCR Take 10 mg by mouth at bedtime.    [provider]  methylphenidate  36 MG PO CR tablet Take 36 mg by mouth every morning.    [provider]  Morphine  Sulfate (MORPHINE  CONCENTRATE) 10 mg / 0.5 ml concentrated solution Place 0.5 mLs (10 mg total) under the tongue every 2 (two) hours as needed for moderate pain (pain score 4-6), severe pain (pain score 7-10), anxiety or shortness of breath (breakthrough pain). 03/26/24   Vann, Jessica U, DO  Multiple Vitamins-Minerals (MULTIVITAMINS THER. W/MINERALS) TABS Take 1 tablet by mouth daily.    [provider]  nystatin cream (MYCOSTATIN) Apply 1 Application topically 2 (two) times daily as needed for dry skin.    [provider]  omeprazole  (PRILOSEC) 20 MG capsule Take 1 capsule (20 mg total) by mouth 2 (two) times daily before a meal. 11/14/23   Daina Drum, MD  ondansetron  (ZOFRAN  ODT) 4 MG disintegrating tablet Take 1 tablet (4 mg total) by mouth every 6 (six) hours as needed for nausea or vomiting. 06/18/23   Esterwood, Amy S, PA-C  Polyethyl Glycol-Propyl Glycol (SYSTANE OP) Place 1 drop into both eyes 3 (three) times daily as needed (dry eyes).    [provider]  polyethylene glycol powder (GLYCOLAX /MIRALAX ) 17 GM/SCOOP powder Take 17 g by mouth daily as needed for moderate constipation.    [provider]  predniSONE  (DELTASONE ) 5 MG tablet Take 1 tablet (5 mg total) by mouth 2 (two) times daily. 12/25/23   Macdonald Savoy, MD    Physical Exam: Vitals:   04/08/24 1729 04/08/24 1742 04/08/24 2033 04/08/24 2100  BP:   122/66  115/64  Pulse:   66 67  Resp:   18 17  Temp: 97.7 F (36.5 C) 97.7 F (36.5 C) 98.3 F (36.8 C)   TempSrc: Oral     SpO2:   96% 93%  Weight:      Height:        Physical Exam Vitals reviewed.  Constitutional:      General: She is not in acute distress. HENT:     Head: Normocephalic and atraumatic.  Eyes:     Extraocular Movements: Extraocular movements intact.  Cardiovascular:     Rate and Rhythm: Normal rate and regular rhythm.     Pulses: Normal pulses.  Pulmonary:     Effort: Pulmonary effort is normal. No respiratory distress.     Breath sounds: Normal breath sounds. No wheezing or rales.  Abdominal:     General: Bowel sounds are normal. There is no distension.     Palpations: Abdomen is soft.     Tenderness: There is abdominal tenderness. There is no guarding.     Comments: Epigastrium mildly tender to palpation  Musculoskeletal:     Cervical back: Normal range of motion.     Right lower leg: No edema.     Left lower leg: No edema.  Skin:    General: Skin is warm and dry.  Neurological:     General: No focal deficit present.     Mental Status: She is alert and oriented to person, place, and time.     Labs on Admission: I have personally reviewed following labs and imaging studies  CBC: Recent Labs  Lab 04/08/24 1728  WBC 14.2*  HGB 11.7*  HCT 37.7  MCV 87.1  PLT  579*   Basic Metabolic Panel: Recent Labs  Lab 04/08/24 1728  NA 128*  K 4.0  CL 95*  CO2 22  GLUCOSE 198*  BUN 12  CREATININE 0.70  CALCIUM  8.5*   GFR: Estimated Creatinine Clearance: 69 mL/min (by C-G formula based on SCr of 0.7 mg/dL). Liver Function Tests: Recent Labs  Lab 04/08/24 1728  AST 33  ALT 20  ALKPHOS 116  BILITOT 0.7  PROT 7.1  ALBUMIN 3.1*   Recent Labs  Lab 04/08/24 1728  LIPASE 25   No results for input(s): "AMMONIA" in the last 168 hours. Coagulation Profile: No results for input(s): "INR", "PROTIME" in the last 168 hours. Cardiac Enzymes: No  results for input(s): "CKTOTAL", "CKMB", "CKMBINDEX", "TROPONINI" in the last 168 hours. BNP (last 3 results) No results for input(s): "PROBNP" in the last 8760 hours. HbA1C: No results for input(s): "HGBA1C" in the last 72 hours. CBG: Recent Labs  Lab 04/08/24 1725  GLUCAP 199*   Lipid Profile: No results for input(s): "CHOL", "HDL", "LDLCALC", "TRIG", "CHOLHDL", "LDLDIRECT" in the last 72 hours. Thyroid  Function Tests: No results for input(s): "TSH", "T4TOTAL", "FREET4", "T3FREE", "THYROIDAB" in the last 72 hours. Anemia Panel: No results for input(s): "VITAMINB12", "FOLATE", "FERRITIN", "TIBC", "IRON", "RETICCTPCT" in the last 72 hours. Urine analysis:    Component Value Date/Time   COLORURINE STRAW (A) 04/08/2024 1922   APPEARANCEUR CLEAR 04/08/2024 1922   LABSPEC 1.003 (L) 04/08/2024 1922   PHURINE 7.0 04/08/2024 1922   GLUCOSEU NEGATIVE 04/08/2024 1922   HGBUR NEGATIVE 04/08/2024 1922   BILIRUBINUR NEGATIVE 04/08/2024 1922   BILIRUBINUR negative 12/22/2023 1234   KETONESUR 5 (A) 04/08/2024 1922   PROTEINUR NEGATIVE 04/08/2024 1922   UROBILINOGEN 0.2 12/22/2023 1234   UROBILINOGEN 0.2 11/21/2022 1337   NITRITE NEGATIVE 04/08/2024 1922   LEUKOCYTESUR NEGATIVE 04/08/2024 1922    Radiological Exams on Admission: CT CHEST ABDOMEN PELVIS W CONTRAST Result Date: 04/08/2024 CLINICAL DATA:  concern for obstruction or hiatal hernia complication. EXAM: CT CHEST, ABDOMEN, AND PELVIS WITH CONTRAST TECHNIQUE: Multidetector CT imaging of the chest, abdomen and pelvis was performed following the standard protocol during bolus administration of intravenous contrast. RADIATION DOSE REDUCTION: This exam was performed according to the departmental dose-optimization program which includes automated exposure control, adjustment of the mA and/or kV according to patient size and/or use of iterative reconstruction technique. CONTRAST:  OMNIPAQUE  IOHEXOL  300 MG/ML  SOLN COMPARISON:  None  Available. FINDINGS: CT CHEST FINDINGS Cardiovascular: Normal heart size. No significant pericardial effusion. The thoracic aorta is normal in caliber. No atherosclerotic plaque of the thoracic aorta. No coronary artery calcifications. No central pulmonary embolus. Limited evaluation more distally due to artifact with question developing right upper lobe segmental pulmonary embolus. Mediastinum/Nodes: No enlarged mediastinal, hilar, or axillary lymph nodes. Thyroid  gland, trachea, and esophagus demonstrate no significant findings. Lungs/Pleura: Centrilobular emphysematous changes. No focal consolidation. No pulmonary nodule. Grossly stable in size of a 12 x 12 cm centrally necrotic heterogeneous right upper lobe mass. Similar-appearing surrounding ground-glass airspace opacities and interlobular septal thickening. Redemonstration of mass effect on the right upper lobe and right mainstem bronchi. No pleural effusion. No pneumothorax. Musculoskeletal: No chest wall abnormality. No suspicious lytic or blastic osseous lesions. No acute displaced fracture. Multilevel degenerative changes of the spine. CT ABDOMEN PELVIS FINDINGS Hepatobiliary: A 3.7 x 3.4 cm right posterior hepatic lobe hepatic hemangioma. Several other pericentimeter hypodensities of the right posterior hepatic lobe suggestive of flash filling hemangiomas (2: 55, 60). Post cholecystectomy. No  biliary dilatation. Pancreas: No focal lesion. Normal pancreatic contour. No surrounding inflammatory changes. No main pancreatic ductal dilatation. Spleen: Normal in size without focal abnormality. Adrenals/Urinary Tract: No adrenal nodule bilaterally. Bilateral kidneys enhance symmetrically. No hydronephrosis. No hydroureter. The urinary bladder is unremarkable.+ On delayed imaging, there is no urothelial wall thickening and there are no filling defects in the opacified portions of the bilateral collecting systems or ureters. Stomach/Bowel: Stomach is within  normal limits. No evidence of bowel wall thickening or dilatation. Stool throughout the colon. Appendix appears normal. Vascular/Lymphatic: No abdominal aorta or iliac aneurysm. Mild atherosclerotic plaque of the aorta and its branches. Lymph node dissection of the pelvis. No abdominal, pelvic, or inguinal lymphadenopathy. Reproductive: Uterus and bilateral adnexa are unremarkable. Other: No intraperitoneal free fluid. No intraperitoneal free gas. No organized fluid collection. Musculoskeletal: No abdominal wall hernia or abnormality. No suspicious lytic or blastic osseous lesions. No acute displaced fracture. Multilevel degenerative changes of the spine. IMPRESSION: 1. No central pulmonary embolus. Limited evaluation more distally due to artifact with question developing right upper lobe segmental pulmonary embolus. No right heart strain or pulmonary infarction. 2. Grossly stable in size of a 12 x 12 cm centrally necrotic heterogeneous right upper lobe mass. Associated mass effect on the right mainstem bronchi. 3. Stool throughout the colon consistent with constipation. 4.  Emphysema (ICD10-J43.9). Electronically Signed   By: Morgane  Naveau M.D.   On: 04/08/2024 20:58    EKG: Independently reviewed.  Sinus rhythm, baseline wander.  Assessment and Plan  Epigastric abdominal pain, nausea, vomiting Normal lipase and LFTs.  CT abdomen pelvis showing stool throughout the colon consistent with constipation.  Patient is endorsing severe acid reflux.  EGD done in September 2024 showing a 2 cm hiatal hernia and gastritis.  She is not actively vomiting at this time and has been able to drink sips of water in the ED.  Denies any dysphagia or odynophagia.  Clear liquid diet ordered for now, advance as tolerated.  Continue IV fluid hydration, antiemetic as needed, and pain management.  Started on IV Protonix  40 mg every 12 hours.  Constipation Patient is currently requesting a dose of IV opiate to help with  abdominal pain and a dose of IV morphine  ordered.  Best to limit use of opiates if possible.  Resume Linzess , MiraLAX  PRN.  ?Acute PE CT chest with contrast showing no central PE but limited evaluation more distally due to artifact with questionable developing right upper lobe segmental PE.  No CT evidence of right heart strain or pulmonary infarction.  Patient is not endorsing chest pain or dyspnea.  Not tachycardic, tachypneic, or hypoxic.  Currently satting 100% on room air.  No clinical signs of DVT on exam. Check D-dimer level.  Stage III adenocarcinoma of the lung Patient had decided not to pursue therapy for her cancer.  Reportedly not a good candidate for immunotherapy due to underlying multiple autoimmune diseases.  CT showing grossly stable in size 12 x 12 cm centrally necrotic heterogeneous right upper lobe mass with associated mass effect on the right mainstem bronchi.  Not dyspneic or hypoxic.  Seen by palliative care during recent hospital admission and was given referral to hospice at home.  Leukocytosis No infectious signs or symptoms.  Repeat CBC in the morning.  Thrombocytosis Platelet count higher compared to previous labs, ?hemoconcentration from dehydration.  Repeat CBC in the morning.  Chronic anemia Hemoglobin higher compared to recent labs.  Monitor CBC.  Hyponatremia Sodium when corrected for hyperglycemia  is close to baseline.  Continue IV fluid hydration and monitor sodium level.  Type 1 diabetes Glucose 198.  A1c 6.9 on 12/08/2023.  Continue home basal insulin  and placed on sensitive sliding scale insulin  every 4 hours for now until her oral intake improves.  ADHD  Rheumatoid arthritis  Hashimoto's thyroiditis  Hypothyroidism  Hyperlipidemia  Neurogenic bladder does self-catheterization        DVT prophylaxis: {Blank single:19197::"Lovenox ","SQ Heparin","IV heparin gtt","Xarelto","Eliquis","Coumadin","SCDs","***"} Code Status: {Blank  single:19197::"Full Code","DNR","DNR/DNI","Comfort Care","***"} Family Communication: ***  Consults called: ***  Level of care: {Blank single:19197::"Med-Surg","Telemetry bed","Progressive Care Unit","Step Down Unit"} Admission status: *** Time Spent: 75+ minutes***  Juliette Oh MD [facility]  If 7PM-7AM, please contact night-coverage [email or web address]  04/08/2024, 10:18 PM

## 2024-04-08 NOTE — ED Provider Notes (Signed)
 [place] EMERGENCY DEPARTMENT AT [facility] Provider Note   CSN: [id number] Arrival date & time: 04/08/24  1715     History  Chief Complaint  Patient presents with   Dizziness    Mary Erickson is a 58 y.o. female.  The history is provided by the patient and medical records. No language interpreter was used.  Chest Pain Pain location:  Epigastric and substernal area Pain quality: aching and pressure   Pain radiates to:  Epigastrium Pain severity:  Severe Onset quality:  Gradual Duration:  3 days Timing:  Constant Progression:  Waxing and waning Chronicity:  New Context comment:  After coughing fits Relieved by:  Nothing Worsened by:  Nothing Ineffective treatments:  None tried Associated symptoms: abdominal pain (upper), fatigue, nausea and vomiting   Associated symptoms: no altered mental status, no back pain (chronic but unchanged), no cough, no diaphoresis, no fever, no headache, no lower extremity edema, no numbness, no palpitations and no shortness of breath        Home Medications Prior to Admission medications   Medication Sig Start Date End Date Taking? Authorizing Provider  acetaminophen  (TYLENOL ) 500 MG tablet Take 1,000 mg by mouth every 6 (six) hours as needed for moderate pain.    [provider]  benzocaine  (ORAJEL) 10 % mucosal gel Use as directed 1 Application in the mouth or throat as needed for mouth pain.    [provider]  Cholecalciferol  (VITAMIN D3) 125 MCG (5000 UT) capsule Take 5,000 Units by mouth daily.    [provider]  ciprofloxacin  (CIPRO ) 500 MG tablet Take 1 tablet (500 mg total) by mouth 2 (two) times daily. 03/26/24   Vann, Jessica U, DO  Continuous Glucose Sensor (DEXCOM G7 SENSOR) MISC Inject 1 applicator into the skin See admin instructions. Every 10 days 11/27/23   [provider]  Cyanocobalamin  (B-12) 3000 MCG CAPS Take 3,000 mcg by mouth daily.    [provider]   diclofenac  Sodium (VOLTAREN ) 1 % GEL Apply 2 g topically 4 (four) times daily. Patient taking differently: Apply 2 g topically 4 (four) times daily as needed (pain). 12/17/19   Darr, Jacob, PA-C  diphenhydrAMINE  HCl (BENADRYL  ALLERGY PO) Take 25 mg by mouth at bedtime.    [provider]  fentaNYL  (DURAGESIC ) 12 MCG/HR Place 1 patch onto the skin every 3 (three) days. 03/28/24   Vann, Jessica U, DO  fluticasone-salmeterol (ADVAIR  HFA) 115-21 MCG/ACT inhaler Inhale 2 puffs into the lungs 2 (two) times daily. 01/29/24   Wilfredo Hanly, MD  glucose blood (ACCU-CHEK GUIDE) test strip 1 each by Other route as needed. 06/05/23   [provider]  HYDROcodone  bit-homatropine (HYCODAN) 5-1.5 MG/5ML syrup Take 5 mLs by mouth every 4 (four) hours as needed for cough. 03/26/24   Vann, Jessica U, DO  ibuprofen  (ADVIL ) 400 MG tablet Take 400 mg by mouth every 6 (six) hours as needed for moderate pain (pain score 4-6).    [provider]  insulin  detemir (LEVEMIR  FLEXTOUCH) 100 UNIT/ML FlexPen Inject 8 Units into the skin 2 (two) times daily. 03/26/24   Vann, Jessica U, DO  Insulin  lispro (HUMALOG JUNIOR KWIKPEN) 100 UNIT/ML Inject 1-7 Units into the skin as needed (high blood sugar).    [provider]  Ketotifen  Fumarate ([employer] ALLERGY EYE DROPS OP) Place 1 drop into both eyes 2 (two) times daily.    [provider]  levothyroxine  (SYNTHROID ) 150 MCG tablet Take 150 mcg by  mouth every evening.    [provider]  linaclotide  (LINZESS ) 145 MCG CAPS capsule Take 1 capsule (145 mcg total) by mouth daily before breakfast. 11/14/23   Daina Drum, MD  LORazepam  (ATIVAN ) 1 MG tablet Take 1 tablet (1 mg total) by mouth every 4 (four) hours as needed for anxiety or sleep (or nausea). 03/26/24   Vann, Jessica U, DO  Melatonin 10-10 MG TBCR Take 10 mg by mouth at bedtime.    [provider]  methylphenidate  36 MG PO CR tablet Take 36 mg by mouth every morning.     [provider]  Morphine  Sulfate (MORPHINE  CONCENTRATE) 10 mg / 0.5 ml concentrated solution Place 0.5 mLs (10 mg total) under the tongue every 2 (two) hours as needed for moderate pain (pain score 4-6), severe pain (pain score 7-10), anxiety or shortness of breath (breakthrough pain). 03/26/24   Vann, Jessica U, DO  Multiple Vitamins-Minerals (MULTIVITAMINS THER. W/MINERALS) TABS Take 1 tablet by mouth daily.    [provider]  nystatin cream (MYCOSTATIN) Apply 1 Application topically 2 (two) times daily as needed for dry skin.    [provider]  omeprazole  (PRILOSEC) 20 MG capsule Take 1 capsule (20 mg total) by mouth 2 (two) times daily before a meal. 11/14/23   Daina Drum, MD  ondansetron  (ZOFRAN  ODT) 4 MG disintegrating tablet Take 1 tablet (4 mg total) by mouth every 6 (six) hours as needed for nausea or vomiting. 06/18/23   Esterwood, Amy S, PA-C  Polyethyl Glycol-Propyl Glycol (SYSTANE OP) Place 1 drop into both eyes 3 (three) times daily as needed (dry eyes).    [provider]  polyethylene glycol powder (GLYCOLAX /MIRALAX ) 17 GM/SCOOP powder Take 17 g by mouth daily as needed for moderate constipation.    [provider]  predniSONE  (DELTASONE ) 5 MG tablet Take 1 tablet (5 mg total) by mouth 2 (two) times daily. 12/25/23   Macdonald Savoy, MD      Allergies    Doxycycline, Aspirin , Nsaids, Amoxicillin -pot clavulanate, Gluten meal, Lactose, Codeine, Hydrocodone -acetaminophen , Oxycodone , Sulfa antibiotics, and Sulfasalazine    Review of Systems   Review of Systems  Constitutional:  Positive for fatigue. Negative for chills, diaphoresis and fever.  HENT:  Negative for congestion.   Respiratory:  Negative for cough, chest tightness and shortness of breath.   Cardiovascular:  Positive for chest pain. Negative for palpitations.  Gastrointestinal:  Positive for abdominal pain (upper), nausea and vomiting. Negative for abdominal distention,  constipation and diarrhea.  Genitourinary:  Negative for dysuria and flank pain.  Musculoskeletal:  Negative for back pain (chronic but unchanged), neck pain and neck stiffness.  Skin:  Negative for rash.  Neurological:  Positive for light-headedness. Negative for numbness and headaches.  Psychiatric/Behavioral:  Negative for agitation and confusion.   All other systems reviewed and are negative.   Physical Exam Updated Vital Signs BP (!) 140/82   Pulse 72   Temp 97.7 F (36.5 C) (Oral)   Resp 20   Ht 5\' 5"  (1.651 m)   Wt 58.7 kg   SpO2 99%   BMI 21.53 kg/m  Physical Exam Vitals and nursing note reviewed.  Constitutional:      General: She is not in acute distress.    Appearance: She is well-developed. She is not ill-appearing, toxic-appearing or diaphoretic.  HENT:     Head: Normocephalic and atraumatic.     Nose: No congestion or rhinorrhea.     Mouth/Throat:  Mouth: Mucous membranes are moist.     Pharynx: No oropharyngeal exudate or posterior oropharyngeal erythema.  Eyes:     Extraocular Movements: Extraocular movements intact.     Conjunctiva/sclera: Conjunctivae normal.     Pupils: Pupils are equal, round, and reactive to light.  Cardiovascular:     Rate and Rhythm: Normal rate and regular rhythm.     Heart sounds: No murmur heard. Pulmonary:     Effort: Pulmonary effort is normal. No respiratory distress.     Breath sounds: Normal breath sounds. No wheezing, rhonchi or rales.  Chest:     Chest wall: Tenderness (lower chest) present.  Abdominal:     Palpations: Abdomen is soft. There is mass (at xyphoid area).     Tenderness: There is abdominal tenderness (epigastric). There is no right CVA tenderness, left CVA tenderness, guarding or rebound.    Musculoskeletal:        General: No swelling or tenderness.     Cervical back: Neck supple.     Right lower leg: No edema.     Left lower leg: No edema.  Skin:    General: Skin is warm and dry.     Capillary  Refill: Capillary refill takes less than 2 seconds.     Findings: No rash.  Neurological:     General: No focal deficit present.     Mental Status: She is alert.  Psychiatric:        Mood and Affect: Mood normal.     ED Results / Procedures / Treatments   Labs (all labs ordered are listed, but only abnormal results are displayed) Labs Reviewed  COMPREHENSIVE METABOLIC PANEL WITH GFR - Abnormal; Notable for the following components:      Result Value   Sodium 128 (*)    Chloride 95 (*)    Glucose, Bld 198 (*)    Calcium  8.5 (*)    Albumin 3.1 (*)    All other components within normal limits  CBC - Abnormal; Notable for the following components:   WBC 14.2 (*)    Hemoglobin 11.7 (*)    Platelets 579 (*)    All other components within normal limits  URINALYSIS, ROUTINE W REFLEX MICROSCOPIC - Abnormal; Notable for the following components:   Color, Urine STRAW (*)    Specific Gravity, Urine 1.003 (*)    Ketones, ur 5 (*)    All other components within normal limits  CBG MONITORING, ED - Abnormal; Notable for the following components:   Glucose-Capillary 199 (*)    All other components within normal limits  LACTIC ACID, PLASMA  LACTIC ACID, PLASMA  LIPASE, BLOOD  TROPONIN I (HIGH SENSITIVITY)  TROPONIN I (HIGH SENSITIVITY)    EKG EKG Interpretation Date/Time:  Wednesday Apr 08 2024 17:27:32 EDT Ventricular Rate:  72 PR Interval:  152 QRS Duration:  87 QT Interval:  384 QTC Calculation: 415 R Axis:   42  Text Interpretation: Sinus rhythm Low voltage, precordial leads when compared to prior, similar appearance No STEMI Confirmed by Wynell Heath ([id number]) on 04/08/2024 6:01:47 PM  Radiology CT CHEST ABDOMEN PELVIS W CONTRAST Result Date: 04/08/2024 CLINICAL DATA:  concern for obstruction or hiatal hernia complication. EXAM: CT CHEST, ABDOMEN, AND PELVIS WITH CONTRAST TECHNIQUE: Multidetector CT imaging of the chest, abdomen and pelvis was performed following the  standard protocol during bolus administration of intravenous contrast. RADIATION DOSE REDUCTION: This exam was performed according to the departmental dose-optimization program which includes automated exposure control, adjustment  of the mA and/or kV according to patient size and/or use of iterative reconstruction technique. CONTRAST:  OMNIPAQUE  IOHEXOL  300 MG/ML  SOLN COMPARISON:  None Available. FINDINGS: CT CHEST FINDINGS Cardiovascular: Normal heart size. No significant pericardial effusion. The thoracic aorta is normal in caliber. No atherosclerotic plaque of the thoracic aorta. No coronary artery calcifications. No central pulmonary embolus. Limited evaluation more distally due to artifact with question developing right upper lobe segmental pulmonary embolus. Mediastinum/Nodes: No enlarged mediastinal, hilar, or axillary lymph nodes. Thyroid  gland, trachea, and esophagus demonstrate no significant findings. Lungs/Pleura: Centrilobular emphysematous changes. No focal consolidation. No pulmonary nodule. Grossly stable in size of a 12 x 12 cm centrally necrotic heterogeneous right upper lobe mass. Similar-appearing surrounding ground-glass airspace opacities and interlobular septal thickening. Redemonstration of mass effect on the right upper lobe and right mainstem bronchi. No pleural effusion. No pneumothorax. Musculoskeletal: No chest wall abnormality. No suspicious lytic or blastic osseous lesions. No acute displaced fracture. Multilevel degenerative changes of the spine. CT ABDOMEN PELVIS FINDINGS Hepatobiliary: A 3.7 x 3.4 cm right posterior hepatic lobe hepatic hemangioma. Several other pericentimeter hypodensities of the right posterior hepatic lobe suggestive of flash filling hemangiomas (2: 55, 60). Post cholecystectomy. No biliary dilatation. Pancreas: No focal lesion. Normal pancreatic contour. No surrounding inflammatory changes. No main pancreatic ductal dilatation. Spleen: Normal in size  without focal abnormality. Adrenals/Urinary Tract: No adrenal nodule bilaterally. Bilateral kidneys enhance symmetrically. No hydronephrosis. No hydroureter. The urinary bladder is unremarkable.+ On delayed imaging, there is no urothelial wall thickening and there are no filling defects in the opacified portions of the bilateral collecting systems or ureters. Stomach/Bowel: Stomach is within normal limits. No evidence of bowel wall thickening or dilatation. Stool throughout the colon. Appendix appears normal. Vascular/Lymphatic: No abdominal aorta or iliac aneurysm. Mild atherosclerotic plaque of the aorta and its branches. Lymph node dissection of the pelvis. No abdominal, pelvic, or inguinal lymphadenopathy. Reproductive: Uterus and bilateral adnexa are unremarkable. Other: No intraperitoneal free fluid. No intraperitoneal free gas. No organized fluid collection. Musculoskeletal: No abdominal wall hernia or abnormality. No suspicious lytic or blastic osseous lesions. No acute displaced fracture. Multilevel degenerative changes of the spine. IMPRESSION: 1. No central pulmonary embolus. Limited evaluation more distally due to artifact with question developing right upper lobe segmental pulmonary embolus. No right heart strain or pulmonary infarction. 2. Grossly stable in size of a 12 x 12 cm centrally necrotic heterogeneous right upper lobe mass. Associated mass effect on the right mainstem bronchi. 3. Stool throughout the colon consistent with constipation. 4.  Emphysema (ICD10-J43.9). Electronically Signed   By: Morgane  Naveau M.D.   On: 04/08/2024 20:58    Procedures Procedures    Medications Ordered in ED Medications  naloxone  (NARCAN ) injection 0.4 mg (has no administration in time range)  morphine  (PF) 2 MG/ML injection 1 mg (has no administration in time range)  senna-docusate (Senokot-S) tablet 1 tablet (has no administration in time range)  polyethylene glycol (MIRALAX  / GLYCOLAX ) packet 17 g  (has no administration in time range)  sodium chloride  0.9 % bolus 1,000 mL (1,000 mLs Intravenous Bolus 04/08/24 1816)  acetaminophen  (TYLENOL ) tablet 650 mg (650 mg Oral Given 04/08/24 1930)  iohexol  (OMNIPAQUE ) 300 MG/ML solution 100 mL (100 mLs Intravenous Contrast Given 04/08/24 2015)  ondansetron  (ZOFRAN ) injection 4 mg (4 mg Intravenous Given 04/08/24 2119)  sodium chloride  0.9 % bolus 1,000 mL (1,000 mLs Intravenous New Bag/Given 04/08/24 2226)    ED Course/ Medical Decision Making/ A&P  Medical Decision Making Amount and/or Complexity of Data Reviewed Labs: ordered. Radiology: ordered.  Risk OTC drugs. Prescription drug management. Decision regarding hospitalization.    CHRISTYANNA MCKEON is a 58 y.o. female with a complex past medical history including hypertension, diabetes, known hiatal hernia, and lung cancer who was recently mated for sepsis and pneumonia who presents with chest and abdominal discomfort.  According to patient, she was just admitted recently for sepsis and pneumonia and had significant coughing.  She reports that the coughing improved several days ago but during her last coughing fits she felt something change in her lower chest and upper abdomen and feels like hiatal hernia change.  She reports that for the last 3 days she has had minimal p.o. intake and feels dehydrated.  She reports she is having to spit up her own saliva at times.  She reports nausea and vomiting and nothing will go down.  She reports some constipation and decreased bowel movement decreased flatus although she does have a loose stool yesterday.  She reports no shortness of breath and no palpitations.  She denies any further coughing.  No fevers or chills.  She denies pain in the lower abdomen and only has chronic back pain from her cancer.  Of note, she is DNR/DNI however she does say that this hiatal hernia is something new that she would like evaluated and if it  needs a procedure she would be willing to do it.  On exam, lungs clear.  Chest was tender in the central lower chest near the xiphoid.  Her epigastric area was tender.  She felt a bulge and hard area there which was palpable.  It did not seem pulsatile.  Lower abdomen nontender.  I did hear some faint bowel sounds.  Back and flanks nontender.  Intact pulses.  Dry mucous membranes.  Vital signs reassuring without hypoxia, tachycardia, tachypnea, or fever.  Clinically I am concerned that patient's coughing fits may have changed to the hiatal hernia given what she is describing.  I spoke to radiology who recommended CT of the chest/abdomen/pelvis with contrast but no oral contrast and did not need CTA.  Will get labs and give her fluids as she appears dehydrated.  Will hold on pain medicine nausea medicine at this time because she just received nausea medicine for coming here and she reports the pain is not bothering her significantly now.  It is only 6 out of 10 she says.  Anticipate reassessment after imaging and workup.  10:02 PM CT scan did not show evidence of acute hiatal hernia or bowel obstruction.  The large mass does show some mass effect onto her mediastinum but overall looks similar.  Clinically I do suspect that this mass is causing difficulty with swallowing leading to her dehydration.  Her sodium and chloride were low and she had ketones.  Although I do not see an acute surgical problem, I am concerned that the patient is unable to maintain hydration at home.  Patient still is ill-appearing.  Will give some more fluids and call for admission due to intolerance of p.o.  Anticipate she will likely need to get seen by the oncology team tomorrow and if she truly is having some obstructive problem with her esophagus due to this mass, unclear if she would need some other way to get nutrition.  Will call for medical admission.           Final Clinical Impression(s) / ED Diagnoses Final  diagnoses:  Dehydration  Dysphagia, unspecified type     Clinical Impression: 1. Dehydration   2. Dysphagia, unspecified type     Disposition: Admit  This note was prepared with assistance of Dragon voice recognition software. Occasional wrong-word or sound-a-like substitutions may have occurred due to the inherent limitations of voice recognition software.     Odie Rauen, Marine Sia, MD 04/08/24 2340

## 2024-04-08 NOTE — ED Triage Notes (Signed)
 Patient BIB EMS from home c/o Nausea/Vomiting and dizziness x3 days. Patient report unable to keep anything down even after PRN nausea medicine. 300ml NS and 4mg  Zofran  given by EMS. Patient denies LOC. Patient denies fever. Hx Lung Ca.

## 2024-04-08 NOTE — H&P (Signed)
 History and Physical    ERNIE SAGRERO UJW:[id number] DOB: 08/20/1966 DOA: 04/08/2024  PCP: Nita Bast, NP  Patient coming from: Home  Chief Complaint: Epigastric abdominal pain  HPI: Mary Erickson is a 58 y.o. female with medical history significant of stage III adenocarcinoma of the lung, Sjogren disease, type 1 diabetes, GERD, ADHD, rheumatoid arthritis, Hashimoto's thyroiditis, hypothyroidism, hyperlipidemia, internal carotid aneurysm, left eye blindness, migraine headaches, seizures, hyponatremia, IBS, melanoma, neurogenic bladder does self-catheterization, hepatitis B, chronic constipation.  Patient was recently admitted 5/11-5/15 for sepsis due to postobstructive pneumonia with hemoptysis.  Patient decided not to pursue therapy for her cancer.  Reportedly not a good candidate for immunotherapy due to underlying multiple autoimmune diseases.  Currently on palliative care and planning to transition to home hospice care.  Patient is presenting today with complaints of epigastric abdominal pain, nausea, and vomiting.  States she was recently admitted to the hospital for pneumonia and had a coughing fit for 2 days.  Cough has now subsided and no longer having shortness of breath.  However, for the past 2 days she is having epigastric abdominal pain, nausea, and vomiting.  She has not been able to take her home medications due to vomiting.  Patient thinks her symptoms are related to hiatal hernia which she was diagnosed with a year ago.  In addition, having severe acid reflux.  She is also constipated but did have bowel movements for the past 2 days.  Denies fevers.  Denies any chest pain/pressure or pleuritic chest pain.  Denies any dysphagia or odynophagia.  She has been able to drink sips of water in the ED.  Patient states she was told she was not a candidate for surgery for her lung cancer, as such, she had decided not to pursue chemotherapy or radiation.  Also she was told that she  has not a good candidate for immunotherapy due to underlying multiple autoimmune diseases.  ED Course: EMS gave 300 mL normal saline and Zofran  4 mg.  Vital signs stable in the ED.  Labs showing WBC count 14.2, hemoglobin 11.7 (stable), platelet count 579k, sodium 128, chloride 95, glucose 198, creatinine 0.7, normal lipase and LFTs, troponin negative x 2, lactic acid normal x 2, UA not suggestive of infection.  CT chest abdomen pelvis with contrast showing no central PE but limited evaluation more distally due to artifact with questionable developing right upper lobe segmental PE.  No CT evidence of right heart strain or pulmonary infarction.  Also showing grossly stable in size 12 x 12 cm centrally necrotic heterogeneous right upper lobe mass with associated mass effect on the right mainstem bronchi.  Also showing stool throughout the colon consistent with constipation. Patient was given Tylenol , Zofran , and 2 L normal saline.  Review of Systems:  Review of Systems  All other systems reviewed and are negative.   Past Medical History:  Diagnosis Date   Acid reflux    ADHD (attention deficit hyperactivity disorder)    Anemia    Arthritis    "elbows, hands, neck, shoulders" (10/02/2016)   Calcium  blood increased    Chronic neck pain    Chronic UTI (urinary tract infection)    "from self caths" (10/02/2016)   Hashimoto's disease    Hepatitis B    acute hepatitis B from lancet   History of alcoholism (HCC)    10/02/2016 "sober since 03/14/2005"   History of blood transfusion 1984; 1989; 2008   "quite a few"   History of shortness  of breath    Hypercholesteremia    Hypothyroidism    no meds   Internal carotid aneurysm dx'd early 2017   Legally blind in left eye, as defined in [place]     Melanoma (HCC)    left upper arm   Migraines    "quite a few in a month; at least 15" (10/02/2016)   Neuropathy    neck; "not sure if it's from DM or Sjogren's" (10/02/2016)   Pneumonia 1986   "1 yr  after major OR when I was under anesthesia for 13 hr"   Seizures (HCC) ~ 2004; ~ 2013   "seizures from diabetes "   Self-catheterizes urinary bladder    "since OR in 1984" (10/02/2016)   Sjogren's syndrome (HCC)    Thyroid  goiter    "still present" (10/02/2016)   Type II diabetes mellitus (HCC)    Urinary retention    self caths   Wears glasses     Past Surgical History:  Procedure Laterality Date   ABDOMINAL HYSTERECTOMY  2008   BILATERAL CARPAL TUNNEL RELEASE Bilateral    BRONCHIAL WASHINGS  12/10/2023   Procedure: BRONCHIAL WASHINGS;  Surgeon: Wilfredo Hanly, MD;  Location: [facility];  Service: Pulmonary;;   ECTOPIC PREGNANCY SURGERY  1989   ENDOBRONCHIAL ULTRASOUND N/A 12/10/2023   Procedure: ENDOBRONCHIAL ULTRASOUND;  Surgeon: Wilfredo Hanly, MD;  Location: [facility];  Service: Pulmonary;  Laterality: N/A;   EXCISION MELANOMA WITH SENTINEL LYMPH NODE BIOPSY Left 10/02/2016   Procedure: WIDE LOCAL EXCISION AND ADVANCEMENT FLAP CLOSURE LEFT UPPER  MELANOMA WITH SENTINEL LYMPH NODE MAPPING AND BIOPSY;  Surgeon: Lockie Rima, MD;  Location: [facility];  Service: General;  Laterality: Left;   EYE SURGERY     FINE NEEDLE ASPIRATION  12/10/2023   Procedure: FINE NEEDLE ASPIRATION (FNA) LINEAR;  Surgeon: Wilfredo Hanly, MD;  Location: [facility];  Service: Pulmonary;;   LAPAROSCOPIC CHOLECYSTECTOMY  ~ 01/2016   MELANOMA EXCISION WITH SENTINEL LYMPH NODE BIOPSY Left 10/02/2016   WIDE LOCAL EXCISION AND ADVANCEMENT FLAP CLOSURE LEFT UPPER  MELANOMA WITH SENTINEL LYMPH NODE MAPPING AND BIOPSY   OVARIAN CYST REMOVAL     SHOULDER ARTHROSCOPY W/ ROTATOR CUFF REPAIR Right 2014   "partial"   STRABISMUS SURGERY Left 1971   STRABISMUS SURGERY Left 03/18/2020   Procedure: STRABISMUS REPAIR;  Surgeon: Dorothey Gate, MD;  Location: [facility];  Service: Ophthalmology;  Laterality: Left;   THYROIDECTOMY N/A 03/01/2023   Procedure: TOTAL THYROIDECTOMY;  Surgeon: Oralee Billow, MD;  Location: [facility];  Service: General;  Laterality: N/A;   TUMOR EXCISION  1984   from tail bone and part of tail bone removed   VIDEO BRONCHOSCOPY N/A 12/10/2023   Procedure: VIDEO BRONCHOSCOPY WITHOUT FLUORO;  Surgeon: Wilfredo Hanly, MD;  Location: [facility];  Service: Pulmonary;  Laterality: N/A;     reports that she quit smoking about 17 years ago. Her smoking use included cigarettes. She started smoking about 41 years ago. She has a 36 pack-year smoking history. She has never used smokeless tobacco. She reports that she does not currently use drugs after having used the following drugs: Marijuana. She reports that she does not drink alcohol.  Allergies  Allergen Reactions   Doxycycline Other (See Comments)    Elevates liver enzymes    Aspirin  Other (See Comments)    Can take 81 mg but has to be coated; No other Aspirin  due to bleeding in stools from ulcers   Nsaids  Other (See Comments)    GI issues    Amoxicillin -Pot Clavulanate Nausea Only   Gluten Meal     Avoid due to autoimmune issues    Lactose     Avoid due to autoimmune issues    Codeine Nausea And Vomiting   Hydrocodone -Acetaminophen  Nausea And Vomiting   Oxycodone  Nausea And Vomiting   Sulfa Antibiotics Nausea And Vomiting   Sulfasalazine Nausea And Vomiting    Family History  Problem Relation Age of Onset   Multiple sclerosis Father    Colon cancer Neg Hx    Stomach cancer Neg Hx    Colon polyps Neg Hx    Esophageal cancer Neg Hx    Rectal cancer Neg Hx     Prior to Admission medications   Medication Sig Start Date End Date Taking? Authorizing Provider  acetaminophen  (TYLENOL ) 500 MG tablet Take 1,000 mg by mouth every 6 (six) hours as needed for moderate pain.    [provider]  benzocaine  (ORAJEL) 10 % mucosal gel Use as directed 1 Application in the mouth or throat as needed for mouth pain.    [provider]  Cholecalciferol  (VITAMIN D3) 125 MCG (5000 UT) capsule Take  5,000 Units by mouth daily.    [provider]  ciprofloxacin  (CIPRO ) 500 MG tablet Take 1 tablet (500 mg total) by mouth 2 (two) times daily. 03/26/24   Vann, Jessica U, DO  Continuous Glucose Sensor (DEXCOM G7 SENSOR) MISC Inject 1 applicator into the skin See admin instructions. Every 10 days 11/27/23   [provider]  Cyanocobalamin  (B-12) 3000 MCG CAPS Take 3,000 mcg by mouth daily.    [provider]  diclofenac  Sodium (VOLTAREN ) 1 % GEL Apply 2 g topically 4 (four) times daily. Patient taking differently: Apply 2 g topically 4 (four) times daily as needed (pain). 12/17/19   Darr, Jacob, PA-C  diphenhydrAMINE  HCl (BENADRYL  ALLERGY PO) Take 25 mg by mouth at bedtime.    [provider]  fentaNYL  (DURAGESIC ) 12 MCG/HR Place 1 patch onto the skin every 3 (three) days. 03/28/24   Vann, Jessica U, DO  fluticasone-salmeterol (ADVAIR  HFA) 115-21 MCG/ACT inhaler Inhale 2 puffs into the lungs 2 (two) times daily. 01/29/24   Wilfredo Hanly, MD  glucose blood (ACCU-CHEK GUIDE) test strip 1 each by Other route as needed. 06/05/23   [provider]  HYDROcodone  bit-homatropine (HYCODAN) 5-1.5 MG/5ML syrup Take 5 mLs by mouth every 4 (four) hours as needed for cough. 03/26/24   Vann, Jessica U, DO  ibuprofen  (ADVIL ) 400 MG tablet Take 400 mg by mouth every 6 (six) hours as needed for moderate pain (pain score 4-6).    [provider]  insulin  detemir (LEVEMIR  FLEXTOUCH) 100 UNIT/ML FlexPen Inject 8 Units into the skin 2 (two) times daily. 03/26/24   Vann, Jessica U, DO  Insulin  lispro (HUMALOG JUNIOR KWIKPEN) 100 UNIT/ML Inject 1-7 Units into the skin as needed (high blood sugar).    [provider]  Ketotifen  Fumarate ([employer] ALLERGY EYE DROPS OP) Place 1 drop into both eyes 2 (two) times daily.    [provider]  levothyroxine  (SYNTHROID ) 150 MCG tablet Take 150 mcg by mouth every evening.    [provider]  linaclotide  (LINZESS )  145 MCG CAPS capsule Take 1 capsule (145 mcg total) by mouth daily before breakfast. 11/14/23   Daina Drum, MD  LORazepam  (ATIVAN ) 1 MG tablet Take 1 tablet (1 mg total) by mouth every 4 (four)  hours as needed for anxiety or sleep (or nausea). 03/26/24   Vann, Jessica U, DO  Melatonin 10-10 MG TBCR Take 10 mg by mouth at bedtime.    [provider]  methylphenidate  36 MG PO CR tablet Take 36 mg by mouth every morning.    [provider]  Morphine  Sulfate (MORPHINE  CONCENTRATE) 10 mg / 0.5 ml concentrated solution Place 0.5 mLs (10 mg total) under the tongue every 2 (two) hours as needed for moderate pain (pain score 4-6), severe pain (pain score 7-10), anxiety or shortness of breath (breakthrough pain). 03/26/24   Vann, Jessica U, DO  Multiple Vitamins-Minerals (MULTIVITAMINS THER. W/MINERALS) TABS Take 1 tablet by mouth daily.    [provider]  nystatin cream (MYCOSTATIN) Apply 1 Application topically 2 (two) times daily as needed for dry skin.    [provider]  omeprazole  (PRILOSEC) 20 MG capsule Take 1 capsule (20 mg total) by mouth 2 (two) times daily before a meal. 11/14/23   Daina Drum, MD  ondansetron  (ZOFRAN  ODT) 4 MG disintegrating tablet Take 1 tablet (4 mg total) by mouth every 6 (six) hours as needed for nausea or vomiting. 06/18/23   Esterwood, Amy S, PA-C  Polyethyl Glycol-Propyl Glycol (SYSTANE OP) Place 1 drop into both eyes 3 (three) times daily as needed (dry eyes).    [provider]  polyethylene glycol powder (GLYCOLAX /MIRALAX ) 17 GM/SCOOP powder Take 17 g by mouth daily as needed for moderate constipation.    [provider]  predniSONE  (DELTASONE ) 5 MG tablet Take 1 tablet (5 mg total) by mouth 2 (two) times daily. 12/25/23   Macdonald Savoy, MD    Physical Exam: Vitals:   04/08/24 1729 04/08/24 1742 04/08/24 2033 04/08/24 2100  BP:   122/66 115/64  Pulse:   66 67  Resp:   18 17  Temp: 97.7 F (36.5 C) 97.7 F  (36.5 C) 98.3 F (36.8 C)   TempSrc: Oral     SpO2:   96% 93%  Weight:      Height:        Physical Exam Vitals reviewed.  Constitutional:      General: She is not in acute distress. HENT:     Head: Normocephalic and atraumatic.  Eyes:     Extraocular Movements: Extraocular movements intact.  Cardiovascular:     Rate and Rhythm: Normal rate and regular rhythm.     Pulses: Normal pulses.  Pulmonary:     Effort: Pulmonary effort is normal. No respiratory distress.     Breath sounds: Normal breath sounds. No wheezing or rales.  Abdominal:     General: Bowel sounds are normal. There is no distension.     Palpations: Abdomen is soft.     Tenderness: There is abdominal tenderness. There is no guarding.     Comments: Epigastrium mildly tender to palpation  Musculoskeletal:     Cervical back: Normal range of motion.     Right lower leg: No edema.     Left lower leg: No edema.  Skin:    General: Skin is warm and dry.  Neurological:     General: No focal deficit present.     Mental Status: She is alert and oriented to person, place, and time.     Labs on Admission: I have personally reviewed following labs and imaging studies  CBC: Recent Labs  Lab 04/08/24 1728  WBC 14.2*  HGB 11.7*  HCT 37.7  MCV 87.1  PLT  579*   Basic Metabolic Panel: Recent Labs  Lab 04/08/24 1728  NA 128*  K 4.0  CL 95*  CO2 22  GLUCOSE 198*  BUN 12  CREATININE 0.70  CALCIUM  8.5*   GFR: Estimated Creatinine Clearance: 69 mL/min (by C-G formula based on SCr of 0.7 mg/dL). Liver Function Tests: Recent Labs  Lab 04/08/24 1728  AST 33  ALT 20  ALKPHOS 116  BILITOT 0.7  PROT 7.1  ALBUMIN 3.1*   Recent Labs  Lab 04/08/24 1728  LIPASE 25   No results for input(s): "AMMONIA" in the last 168 hours. Coagulation Profile: No results for input(s): "INR", "PROTIME" in the last 168 hours. Cardiac Enzymes: No results for input(s): "CKTOTAL", "CKMB", "CKMBINDEX", "TROPONINI" in the  last 168 hours. BNP (last 3 results) No results for input(s): "PROBNP" in the last 8760 hours. HbA1C: No results for input(s): "HGBA1C" in the last 72 hours. CBG: Recent Labs  Lab 04/08/24 1725  GLUCAP 199*   Lipid Profile: No results for input(s): "CHOL", "HDL", "LDLCALC", "TRIG", "CHOLHDL", "LDLDIRECT" in the last 72 hours. Thyroid  Function Tests: No results for input(s): "TSH", "T4TOTAL", "FREET4", "T3FREE", "THYROIDAB" in the last 72 hours. Anemia Panel: No results for input(s): "VITAMINB12", "FOLATE", "FERRITIN", "TIBC", "IRON", "RETICCTPCT" in the last 72 hours. Urine analysis:    Component Value Date/Time   COLORURINE STRAW (A) 04/08/2024 1922   APPEARANCEUR CLEAR 04/08/2024 1922   LABSPEC 1.003 (L) 04/08/2024 1922   PHURINE 7.0 04/08/2024 1922   GLUCOSEU NEGATIVE 04/08/2024 1922   HGBUR NEGATIVE 04/08/2024 1922   BILIRUBINUR NEGATIVE 04/08/2024 1922   BILIRUBINUR negative 12/22/2023 1234   KETONESUR 5 (A) 04/08/2024 1922   PROTEINUR NEGATIVE 04/08/2024 1922   UROBILINOGEN 0.2 12/22/2023 1234   UROBILINOGEN 0.2 11/21/2022 1337   NITRITE NEGATIVE 04/08/2024 1922   LEUKOCYTESUR NEGATIVE 04/08/2024 1922    Radiological Exams on Admission: CT CHEST ABDOMEN PELVIS W CONTRAST Result Date: 04/08/2024 CLINICAL DATA:  concern for obstruction or hiatal hernia complication. EXAM: CT CHEST, ABDOMEN, AND PELVIS WITH CONTRAST TECHNIQUE: Multidetector CT imaging of the chest, abdomen and pelvis was performed following the standard protocol during bolus administration of intravenous contrast. RADIATION DOSE REDUCTION: This exam was performed according to the departmental dose-optimization program which includes automated exposure control, adjustment of the mA and/or kV according to patient size and/or use of iterative reconstruction technique. CONTRAST:  OMNIPAQUE  IOHEXOL  300 MG/ML  SOLN COMPARISON:  None Available. FINDINGS: CT CHEST FINDINGS Cardiovascular: Normal heart size. No  significant pericardial effusion. The thoracic aorta is normal in caliber. No atherosclerotic plaque of the thoracic aorta. No coronary artery calcifications. No central pulmonary embolus. Limited evaluation more distally due to artifact with question developing right upper lobe segmental pulmonary embolus. Mediastinum/Nodes: No enlarged mediastinal, hilar, or axillary lymph nodes. Thyroid  gland, trachea, and esophagus demonstrate no significant findings. Lungs/Pleura: Centrilobular emphysematous changes. No focal consolidation. No pulmonary nodule. Grossly stable in size of a 12 x 12 cm centrally necrotic heterogeneous right upper lobe mass. Similar-appearing surrounding ground-glass airspace opacities and interlobular septal thickening. Redemonstration of mass effect on the right upper lobe and right mainstem bronchi. No pleural effusion. No pneumothorax. Musculoskeletal: No chest wall abnormality. No suspicious lytic or blastic osseous lesions. No acute displaced fracture. Multilevel degenerative changes of the spine. CT ABDOMEN PELVIS FINDINGS Hepatobiliary: A 3.7 x 3.4 cm right posterior hepatic lobe hepatic hemangioma. Several other pericentimeter hypodensities of the right posterior hepatic lobe suggestive of flash filling hemangiomas (2: 55, 60). Post cholecystectomy. No  biliary dilatation. Pancreas: No focal lesion. Normal pancreatic contour. No surrounding inflammatory changes. No main pancreatic ductal dilatation. Spleen: Normal in size without focal abnormality. Adrenals/Urinary Tract: No adrenal nodule bilaterally. Bilateral kidneys enhance symmetrically. No hydronephrosis. No hydroureter. The urinary bladder is unremarkable.+ On delayed imaging, there is no urothelial wall thickening and there are no filling defects in the opacified portions of the bilateral collecting systems or ureters. Stomach/Bowel: Stomach is within normal limits. No evidence of bowel wall thickening or dilatation. Stool  throughout the colon. Appendix appears normal. Vascular/Lymphatic: No abdominal aorta or iliac aneurysm. Mild atherosclerotic plaque of the aorta and its branches. Lymph node dissection of the pelvis. No abdominal, pelvic, or inguinal lymphadenopathy. Reproductive: Uterus and bilateral adnexa are unremarkable. Other: No intraperitoneal free fluid. No intraperitoneal free gas. No organized fluid collection. Musculoskeletal: No abdominal wall hernia or abnormality. No suspicious lytic or blastic osseous lesions. No acute displaced fracture. Multilevel degenerative changes of the spine. IMPRESSION: 1. No central pulmonary embolus. Limited evaluation more distally due to artifact with question developing right upper lobe segmental pulmonary embolus. No right heart strain or pulmonary infarction. 2. Grossly stable in size of a 12 x 12 cm centrally necrotic heterogeneous right upper lobe mass. Associated mass effect on the right mainstem bronchi. 3. Stool throughout the colon consistent with constipation. 4.  Emphysema (ICD10-J43.9). Electronically Signed   By: Morgane  Naveau M.D.   On: 04/08/2024 20:58    EKG: Independently reviewed.  Sinus rhythm, baseline wander.  Assessment and Plan  Epigastric abdominal pain, nausea, vomiting Normal lipase and LFTs.  CT abdomen pelvis showing stool throughout the colon consistent with constipation.  Patient is endorsing severe acid reflux.  EGD done in September 2024 showing a 2 cm hiatal hernia and gastritis.  She is not actively vomiting at this time and has been able to drink sips of water in the ED.  Denies any dysphagia or odynophagia.  Clear liquid diet ordered for now, advance as tolerated.  Continue IV fluid hydration, antiemetic as needed, and pain management.  Started on IV Protonix  40 mg every 12 hours.  Constipation Patient is currently requesting a dose of IV opiate to help with abdominal pain and a dose of IV morphine  ordered.  Best to limit use of opiates  if possible.  Resume Linzess , MiraLAX  PRN.  ?Acute PE CT chest with contrast showing no central PE but limited evaluation more distally due to artifact with questionable developing right upper lobe segmental PE.  No CT evidence of right heart strain or pulmonary infarction.  Patient is not endorsing chest pain or dyspnea.  Not tachycardic, tachypneic, or hypoxic.  Currently satting 100% on room air.  No clinical signs of DVT on exam. Check D-dimer level.  Stage III adenocarcinoma of the lung Patient had decided not to pursue therapy for her cancer.  Reportedly not a good candidate for immunotherapy due to underlying multiple autoimmune diseases.  CT showing grossly stable in size 12 x 12 cm centrally necrotic heterogeneous right upper lobe mass with associated mass effect on the right mainstem bronchi.  Not dyspneic or hypoxic.  Seen by palliative care during recent hospital admission and was given referral to hospice at home.  Leukocytosis No infectious signs or symptoms.  Repeat CBC in the morning.  Thrombocytosis Platelet count higher compared to previous labs, ?hemoconcentration from dehydration.  Repeat CBC in the morning.  Chronic anemia Hemoglobin higher compared to recent labs.  Monitor CBC.  Hyponatremia Sodium when corrected for hyperglycemia  is close to baseline.  Continue IV fluid hydration and monitor sodium level.  Type 1 diabetes Glucose 198.  A1c 6.9 on 12/08/2023.  Continue home basal insulin  and placed on sensitive sliding scale insulin  every 4 hours for now until her oral intake improves.  ADHD Continue methylphenidate .  Rheumatoid arthritis Continue prednisone .  Hypothyroidism Continue Synthroid  and check TSH.  History of neurogenic bladder Patient does self catheterization at home, continue.  DVT prophylaxis: Lovenox  Code Status: DNR/DNI (discussed with the patient)  Level of care: Telemetry bed Admission status: It is my clinical opinion that referral for  OBSERVATION is reasonable and necessary in this patient based on the above information provided. The aforementioned taken together are felt to place the patient at high risk for further clinical deterioration. However, it is anticipated that the patient may be medically stable for discharge from the hospital within 24 to 48 hours.  Juliette Oh MD [facility]  If 7PM-7AM, please contact night-coverage [email or web address]  04/08/2024, 10:18 PM

## 2024-04-09 DIAGNOSIS — R1310 Dysphagia, unspecified: Secondary | ICD-10-CM | POA: Diagnosis not present

## 2024-04-09 DIAGNOSIS — E860 Dehydration: Secondary | ICD-10-CM | POA: Diagnosis not present

## 2024-04-09 DIAGNOSIS — R1013 Epigastric pain: Secondary | ICD-10-CM | POA: Diagnosis not present

## 2024-04-09 LAB — BASIC METABOLIC PANEL WITH GFR
Anion gap: 6 (ref 5–15)
BUN: 10 mg/dL (ref 6–20)
CO2: 23 mmol/L (ref 22–32)
Calcium: 8.4 mg/dL — ABNORMAL LOW (ref 8.9–10.3)
Chloride: 101 mmol/L (ref 98–111)
Creatinine, Ser: 0.76 mg/dL (ref 0.44–1.00)
GFR, Estimated: >60 mL/min (ref >60)
Glucose, Bld: 123 mg/dL — ABNORMAL HIGH (ref 70–99)
Potassium: 3.7 mmol/L (ref 3.5–5.1)
Sodium: 130 mmol/L — ABNORMAL LOW (ref 135–145)

## 2024-04-09 LAB — GLUCOSE, CAPILLARY
Glucose-Capillary: 109 mg/dL — ABNORMAL HIGH (ref 70–99)
Glucose-Capillary: 120 mg/dL — ABNORMAL HIGH (ref 70–99)
Glucose-Capillary: 124 mg/dL — ABNORMAL HIGH (ref 70–99)
Glucose-Capillary: 158 mg/dL — ABNORMAL HIGH (ref 70–99)
Glucose-Capillary: 171 mg/dL — ABNORMAL HIGH (ref 70–99)
Glucose-Capillary: 217 mg/dL — ABNORMAL HIGH (ref 70–99)
Glucose-Capillary: 248 mg/dL — ABNORMAL HIGH (ref 70–99)
Glucose-Capillary: 39 mg/dL — CL (ref 70–99)
Glucose-Capillary: 60 mg/dL — ABNORMAL LOW (ref 70–99)

## 2024-04-09 LAB — CBC
HCT: 34.1 % — ABNORMAL LOW (ref 36.0–46.0)
Hemoglobin: 10.6 g/dL — ABNORMAL LOW (ref 12.0–15.0)
MCH: 27.2 pg (ref 26.0–34.0)
MCHC: 31.1 g/dL (ref 30.0–36.0)
MCV: 87.7 fL (ref 80.0–100.0)
Platelets: 587 10*3/uL — ABNORMAL HIGH (ref 150–400)
RBC: 3.89 MIL/uL (ref 3.87–5.11)
RDW: 15.2 % (ref 11.5–15.5)
WBC: 13.1 10*3/uL — ABNORMAL HIGH (ref 4.0–10.5)
nRBC: 0 % (ref 0.0–0.2)

## 2024-04-09 LAB — D-DIMER, QUANTITATIVE: D-Dimer, Quant: 1.26 ug{FEU}/mL — ABNORMAL HIGH (ref 0.00–0.50)

## 2024-04-09 LAB — TSH: TSH: 43.516 u[IU]/mL — ABNORMAL HIGH (ref 0.350–4.500)

## 2024-04-09 MED ORDER — MORPHINE SULFATE (PF) 2 MG/ML IV SOLN
2.0000 mg | Freq: Once | INTRAVENOUS | Status: AC
  Administered 2024-04-09: 2 mg via INTRAVENOUS
  Filled 2024-04-09: qty 1

## 2024-04-09 MED ORDER — SMOG ENEMA
960.0000 mL | Freq: Once | RECTAL | Status: AC
  Administered 2024-04-09: 960 mL via RECTAL
  Filled 2024-04-09: qty 960

## 2024-04-09 MED ORDER — ALUM & MAG HYDROXIDE-SIMETH 200-200-20 MG/5ML PO SUSP
30.0000 mL | Freq: Once | ORAL | Status: AC | PRN
  Administered 2024-04-09: 30 mL via ORAL
  Filled 2024-04-09: qty 30

## 2024-04-09 MED ORDER — BOOST / RESOURCE BREEZE PO LIQD CUSTOM
1.0000 | Freq: Three times a day (TID) | ORAL | Status: DC
  Administered 2024-04-09 – 2024-04-10 (×4): 1 via ORAL

## 2024-04-09 MED ORDER — INSULIN ASPART 100 UNIT/ML IJ SOLN
0.0000 [IU] | INTRAMUSCULAR | Status: DC
  Administered 2024-04-09 (×2): 2 [IU] via SUBCUTANEOUS
  Administered 2024-04-10: 1 [IU] via SUBCUTANEOUS

## 2024-04-09 MED ORDER — LEVOTHYROXINE SODIUM 100 MCG PO TABS
200.0000 ug | ORAL_TABLET | Freq: Every day | ORAL | Status: DC
  Administered 2024-04-10: 200 ug via ORAL
  Filled 2024-04-09: qty 2

## 2024-04-09 MED ORDER — SODIUM CHLORIDE 0.9 % IV SOLN
25.0000 mg | Freq: Four times a day (QID) | INTRAVENOUS | Status: DC | PRN
  Administered 2024-04-09: 25 mg via INTRAVENOUS
  Filled 2024-04-09: qty 1

## 2024-04-09 MED ORDER — INSULIN GLARGINE-YFGN 100 UNIT/ML ~~LOC~~ SOLN
6.0000 [IU] | Freq: Two times a day (BID) | SUBCUTANEOUS | Status: DC
  Administered 2024-04-09 – 2024-04-10 (×3): 6 [IU] via SUBCUTANEOUS
  Filled 2024-04-09 (×4): qty 0.06

## 2024-04-09 MED ORDER — INSULIN GLARGINE-YFGN 100 UNIT/ML ~~LOC~~ SOLN
8.0000 [IU] | Freq: Two times a day (BID) | SUBCUTANEOUS | Status: DC
  Administered 2024-04-09: 8 [IU] via SUBCUTANEOUS
  Filled 2024-04-09 (×2): qty 0.08

## 2024-04-09 MED ORDER — MELATONIN 5 MG PO TABS
10.0000 mg | ORAL_TABLET | Freq: Every day | ORAL | Status: DC
  Administered 2024-04-09 (×2): 10 mg via ORAL
  Filled 2024-04-09 (×2): qty 2

## 2024-04-09 NOTE — Inpatient Diabetes Management (Signed)
 Inpatient Diabetes Program Recommendations  AACE/ADA: New Consensus Statement on Inpatient Glycemic Control   Target Ranges:  Prepandial:   less than 140 mg/dL      Peak postprandial:   less than 180 mg/dL (1-2 hours)      Critically ill patients:  140 - 180 mg/dL    Latest Reference Range & Units 04/08/24 17:25 04/09/24 00:01 04/09/24 03:42 04/09/24 07:29  Glucose-Capillary 70 - 99 mg/dL 161 (H) 096 (H) 045 (H) 60 (L)   Review of Glycemic Control  Diabetes history: DM1 Outpatient Diabetes medications: Levemir  8 units BID, Humalog 1-7 units as needed, Dexcom G7; Prednisone  5 mg BID Current orders for Inpatient glycemic control: Semglee  8 units BID, Novolog  0-9 units Q4H; Prednisone  5 mg BID  Inpatient Diabetes Program Recommendations:    Insulin : CBG 60 mg/dl at 4:09 am today. Patient received Semglee  8 units at 00:31 am today. Please change Semglee  to 6 units at bedtime (to start tonight) and decrease Novolog  correction to 0-6 units Q4H.  Thanks, Beacher Limerick, RN, MSN, CDCES Diabetes Coordinator Inpatient Diabetes Program [phone] (Team Pager from 8am to 5pm)

## 2024-04-09 NOTE — TOC Initial Note (Addendum)
 Transition of Care [facility]) - Initial/Assessment Note    Patient Details  Name: Mary Erickson MRN: [id number] Date of Birth: 23-Sep-1966  Transition of Care [facility]) CM/SW Contact:    Bari Leys, RN Phone Number: 04/09/2024, 12:11 PM  Clinical Narrative:   Met with patient at bedside to introduce role of TOC/NCM and review for dc planning, pt has an established PCP, being followed at [facility] for hx Lung Ca, confirmed she is receiving home hospice through [facility]. Pt reports she resides alone with good support from friends/neighbors. Pt reports she uses home 02, SW at  [facility] was to order  a home nebulizer, states she will follow up. NCM will notified  [facility], rep- Melissa,  of admission. TOC will continue to follow.     -12:22pm Teams chat received from [facility] , rep w/[facility], confirmed patient on service for Outpatient Palliative, states will schedule visit with patient.      Barriers to Discharge: Continued Medical Work up   Patient Goals and CMS Choice Patient states their goals for this hospitalization and ongoing recovery are:: Home with Hospice          Expected Discharge Plan and Services       Living arrangements for the past 2 months: Apartment                                      Prior Living Arrangements/Services Living arrangements for the past 2 months: Apartment Lives with:: Self Patient language and need for interpreter reviewed:: Yes Do you feel safe going back to the place where you live?: Yes      Need for Family Participation in Patient Care: Yes (Comment) Care giver support system in place?: Yes (comment) Current home services: Hospice ([facility]) Criminal Activity/Legal Involvement Pertinent to Current Situation/Hospitalization: No - Comment as needed  Activities of Daily Living   ADL Screening (condition at time of admission) Independently performs ADLs?: Yes (appropriate for developmental  age) Is the patient deaf or have difficulty hearing?: No Does the patient have difficulty seeing, even when wearing glasses/contacts?: No Does the patient have difficulty concentrating, remembering, or making decisions?: No  Permission Sought/Granted                  Emotional Assessment Appearance:: Appears stated age Attitude/Demeanor/Rapport: Engaged Affect (typically observed): Accepting Orientation: : Oriented to Self, Oriented to Place, Oriented to  Time, Oriented to Situation Alcohol / Substance Use: Not Applicable Psych Involvement: No (comment)  Admission diagnosis:  Dehydration [E86.0] Abdominal pain [R10.9] Dysphagia, unspecified type [R13.10] Patient Active Problem List   Diagnosis Date Noted   Nausea and vomiting 04/08/2024   Epigastric abdominal pain 04/08/2024   Sepsis due to pneumonia (HCC) 03/22/2024   ADHD 03/22/2024   Non-small cell lung cancer (NSCLC) (HCC) 03/22/2024   Cough with hemoptysis 03/22/2024   Hypocalcemia 03/22/2024   GERD (gastroesophageal reflux disease) 03/22/2024   History of melanoma 03/22/2024   Hepatitis B 03/22/2024   Adenocarcinoma of upper lobe of right lung (HCC) 12/25/2023   Rheumatoid arthritis in remission (HCC) 12/11/2023   History of seizures 12/11/2023   Constipation 12/11/2023   Small intestinal bacterial overgrowth (SIBO) 12/11/2023   Mass of upper lobe of right lung 12/08/2023   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   Goiter, toxic, multinodular 02/24/2023   Hyponatremia 04/17/2021   IBS (irritable bowel syndrome) 04/17/2021   Dizziness 12/11/2019  Colitis 12/11/2019   Abnormal CT scan, colon    Acute colitis 12/08/2019   Orthostatic hypotension 12/08/2019   Type 1 diabetes mellitus (HCC) 06/30/2019   Hashimoto's disease 06/30/2019   Sjogren syndrome (HCC) 06/30/2019   Hx of Hypertension 06/30/2019   Neurogenic bladder 06/30/2019   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) 10/02/2016   PCP:  Nita Bast,  NP Pharmacy:   [facility] [address] [place] [address] Phone: [phone] Fax: [phone]     Social Drivers of Health (SDOH) Social History: SDOH Screenings   Food Insecurity: No Food Insecurity (04/09/2024)  Housing: Low Risk  (04/09/2024)  Transportation Needs: No Transportation Needs (04/09/2024)  Utilities: Not At Risk (04/09/2024)  Depression (PHQ2-9): Medium Risk (06/30/2019)  Financial Resource Strain: Low Risk  (02/13/2024)   Received from [facility]  Physical Activity: Unknown (08/13/2023)   Received from [facility]  Social Connections: Moderately Integrated (04/09/2024)  Stress: Patient Declined (08/13/2023)   Received from [facility]  Tobacco Use: Medium Risk (04/08/2024)   SDOH Interventions:     Readmission Risk Interventions    03/23/2024   12:39 PM 12/09/2023    8:24 AM  Readmission Risk Prevention Plan  Transportation Screening Complete Complete  PCP or Specialist Appt within 5-7 Days  Complete  PCP or Specialist Appt within 3-5 Days Complete   Home Care Screening  Complete  Medication Review (RN CM)  Complete  HRI or Home Care Consult Complete   Social Work Consult for Recovery Care Planning/Counseling Complete   Palliative Care Screening Complete   Medication Review Oceanographer) Complete

## 2024-04-09 NOTE — Progress Notes (Signed)
 Progress Note   Patient: Mary Erickson ZOX:[id number] DOB: 1966/06/05 DOA: 04/08/2024     0 DOS: the patient was seen and examined on 04/09/2024   Brief hospital course: 58 y.o. female with medical history significant of stage III adenocarcinoma of the lung, Sjogren disease, type 1 diabetes, GERD, ADHD, rheumatoid arthritis, Hashimoto's thyroiditis, hypothyroidism, hyperlipidemia, internal carotid aneurysm, left eye blindness, migraine headaches, seizures, hyponatremia, IBS, melanoma, neurogenic bladder does self-catheterization, hepatitis B, chronic constipation.  Patient was recently admitted 5/11-5/15 for sepsis due to postobstructive pneumonia with hemoptysis.  Patient decided not to pursue therapy for her cancer.  Reportedly not a good candidate for immunotherapy due to underlying multiple autoimmune diseases.  Currently on palliative care and planning to transition to home hospice care.   Patient is presenting today with complaints of epigastric abdominal pain, nausea, and vomiting.  States she was recently admitted to the hospital for pneumonia and had a coughing fit for 2 days.  Cough has now subsided and no longer having shortness of breath.  However, for the past 2 days she is having epigastric abdominal pain, nausea, and vomiting.  She has not been able to take her home medications due to vomiting.  Patient thinks her symptoms are related to hiatal hernia which she was diagnosed with a year ago.  In addition, having severe acid reflux.  She is also constipated but did have bowel movements for the past 2 days.  Denies fevers.  Denies any chest pain/pressure or pleuritic chest pain.  Denies any dysphagia or odynophagia.  She has been able to drink sips of water in the ED.  Patient states she was told she was not a candidate for surgery for her lung cancer, as such, she had decided not to pursue chemotherapy or radiation.  Also she was told that she has not a good candidate for immunotherapy due  to underlying multiple autoimmune diseases.   ED Course: EMS gave 300 mL normal saline and Zofran  4 mg.  Vital signs stable in the ED.  Labs showing WBC count 14.2, hemoglobin 11.7 (stable), platelet count 579k, sodium 128, chloride 95, glucose 198, creatinine 0.7, normal lipase and LFTs, troponin negative x 2, lactic acid normal x 2, UA not suggestive of infection.  CT chest abdomen pelvis with contrast showing no central PE but limited evaluation more distally due to artifact with questionable developing right upper lobe segmental PE.  No CT evidence of right heart strain or pulmonary infarction.  Also showing grossly stable in size 12 x 12 cm centrally necrotic heterogeneous right upper lobe mass with associated mass effect on the right mainstem bronchi.  Also showing stool throughout the colon consistent with constipation. Patient was given Tylenol , Zofran , and 2 L normal saline.    Assessment and Plan: Epigastric abdominal pain, nausea, vomiting Normal lipase and LFTs.  CT abdomen pelvis showing stool throughout the colon consistent with constipation.  Patient is endorsing severe acid reflux.  EGD done in September 2024 showing a 2 cm hiatal hernia and gastritis.   -CT abd reviewed. Extensive stool throughout the colon -Recommend more aggressive bowel regimen, per below   Constipation -On opiates PTA for chronic pains -Resume Linzess , MiraLAX  PRN. -Given extensiveness of stool burden, recommend SMOG enema. May need multiple trials  Acute PE ruled out CT chest with contrast showing no central PE but limited evaluation more distally due to artifact with questionable developing right upper lobe segmental PE.  No CT evidence of right heart strain or pulmonary infarction.  Patient is not endorsing chest pain or dyspnea.  Not tachycardic, tachypneic, or hypoxic.  -D-dimer is expected to be elevated given malignancy -Reviewed CT with Pulmonary on service. No evidence of PE noted   Stage III  adenocarcinoma of the lung Patient had decided not to pursue therapy for her cancer.  Reportedly not a good candidate for immunotherapy due to underlying multiple autoimmune diseases.  CT showing grossly stable in size 12 x 12 cm centrally necrotic heterogeneous right upper lobe mass with associated mass effect on the right mainstem bronchi.  Not dyspneic or hypoxic.  Seen by palliative care during recent hospital admission and was given referral to hospice at home.   Leukocytosis No infectious signs or symptoms.   -stable   Thrombocytosis Platelet count higher compared to previous labs, -Remains stable   Chronic anemia Hemoglobin higher compared to recent labs.  Monitor CBC.   Hyponatremia Sodium when corrected for hyperglycemia is close to baseline.  Continue IV fluid hydration and monitor sodium level. -Improved   Type 1 diabetes Glucose 198.  A1c 6.9 on 12/08/2023.  Continue home basal insulin  and placed on sensitive sliding scale insulin  every 4 hours for now until her oral intake improves.   ADHD Continue methylphenidate .   Rheumatoid arthritis Continue prednisone .   Hypothyroidism TSH is elevated at 43.516 -Increased levothyroxine  to   History of neurogenic bladder Patient does self catheterization at home, continue.      Subjective: Complaining of nausea. Still no BM. Did report some "diarrhea" 2-3 days prior  Physical Exam: Vitals:   04/09/24 0345 04/09/24 0414 04/09/24 1005 04/09/24 1353  BP: (!) 95/48  110/63 (!) 101/58  Pulse: 63  67 69  Resp: 17  18 16   Temp: 98.1 F (36.7 C)  98.3 F (36.8 C) 98.7 F (37.1 C)  TempSrc:    Oral  SpO2: 95%  98% 94%  Weight:  55.5 kg    Height:  5\' 8"  (1.727 m)     General exam: Awake, laying in bed, in nad Respiratory system: Normal respiratory effort, no wheezing Cardiovascular system: regular rate, s1, s2 Gastrointestinal system: Soft, nondistended, positive BS Central nervous system: CN2-12 grossly intact,  strength intact Extremities: Perfused, no clubbing Skin: Normal skin turgor, no notable skin lesions seen Psychiatry: Mood normal // no visual hallucinations   Data Reviewed:  Labs reviewed: Na 130, K 3.7, Cr 0.76, WBC 13.1, Hgb 10.6, Plts 587  Family Communication: Pt in room, family not at bedside  Disposition: Status is: Observation The patient remains OBS appropriate and will d/c before 2 midnights.  Planned Discharge Destination: Home     Author: Cherylle Corwin, MD 04/09/2024 4:24 PM  For on call review [email or web address].

## 2024-04-09 NOTE — Plan of Care (Signed)
 Patient verbalized understanding of recommended education.

## 2024-04-09 NOTE — Hospital Course (Signed)
 58 y.o. female with medical history significant of stage III adenocarcinoma of the lung, Sjogren disease, type 1 diabetes, GERD, ADHD, rheumatoid arthritis, Hashimoto's thyroiditis, hypothyroidism, hyperlipidemia, internal carotid aneurysm, left eye blindness, migraine headaches, seizures, hyponatremia, IBS, melanoma, neurogenic bladder does self-catheterization, hepatitis B, chronic constipation.  Patient was recently admitted 5/11-5/15 for sepsis due to postobstructive pneumonia with hemoptysis.  Patient decided not to pursue therapy for her cancer.  Reportedly not a good candidate for immunotherapy due to underlying multiple autoimmune diseases.  Currently on palliative care and planning to transition to home hospice care.   Patient is presenting today with complaints of epigastric abdominal pain, nausea, and vomiting.  States she was recently admitted to the hospital for pneumonia and had a coughing fit for 2 days.  Cough has now subsided and no longer having shortness of breath.  However, for the past 2 days she is having epigastric abdominal pain, nausea, and vomiting.  She has not been able to take her home medications due to vomiting.  Patient thinks her symptoms are related to hiatal hernia which she was diagnosed with a year ago.  In addition, having severe acid reflux.  She is also constipated but did have bowel movements for the past 2 days.  Denies fevers.  Denies any chest pain/pressure or pleuritic chest pain.  Denies any dysphagia or odynophagia.  She has been able to drink sips of water in the ED.  Patient states she was told she was not a candidate for surgery for her lung cancer, as such, she had decided not to pursue chemotherapy or radiation.  Also she was told that she has not a good candidate for immunotherapy due to underlying multiple autoimmune diseases.   ED Course: EMS gave 300 mL normal saline and Zofran  4 mg.  Vital signs stable in the ED.  Labs showing WBC count 14.2, hemoglobin  11.7 (stable), platelet count 579k, sodium 128, chloride 95, glucose 198, creatinine 0.7, normal lipase and LFTs, troponin negative x 2, lactic acid normal x 2, UA not suggestive of infection.  CT chest abdomen pelvis with contrast showing no central PE but limited evaluation more distally due to artifact with questionable developing right upper lobe segmental PE.  No CT evidence of right heart strain or pulmonary infarction.  Also showing grossly stable in size 12 x 12 cm centrally necrotic heterogeneous right upper lobe mass with associated mass effect on the right mainstem bronchi.  Also showing stool throughout the colon consistent with constipation. Patient was given Tylenol , Zofran , and 2 L normal saline.

## 2024-04-10 ENCOUNTER — Other Ambulatory Visit (HOSPITAL_COMMUNITY): Payer: Self-pay

## 2024-04-10 DIAGNOSIS — R1013 Epigastric pain: Secondary | ICD-10-CM | POA: Diagnosis not present

## 2024-04-10 DIAGNOSIS — E860 Dehydration: Secondary | ICD-10-CM | POA: Diagnosis not present

## 2024-04-10 DIAGNOSIS — R1310 Dysphagia, unspecified: Secondary | ICD-10-CM | POA: Diagnosis not present

## 2024-04-10 LAB — GLUCOSE, CAPILLARY
Glucose-Capillary: 64 mg/dL — ABNORMAL LOW (ref 70–99)
Glucose-Capillary: 97 mg/dL (ref 70–99)
Glucose-Capillary: 102 mg/dL — ABNORMAL HIGH (ref 70–99)
Glucose-Capillary: 99 mg/dL (ref 70–99)

## 2024-04-10 MED ORDER — LEVEMIR FLEXTOUCH 100 UNIT/ML ~~LOC~~ SOPN: 5.0000 [IU] | PEN_INJECTOR | Freq: Two times a day (BID) | SUBCUTANEOUS | Status: DC

## 2024-04-10 MED ORDER — INSULIN GLARGINE-YFGN 100 UNIT/ML ~~LOC~~ SOLN
5.0000 [IU] | Freq: Two times a day (BID) | SUBCUTANEOUS | Status: DC
  Filled 2024-04-10: qty 0.05

## 2024-04-10 MED ORDER — LEVOTHYROXINE SODIUM 200 MCG PO TABS
200.0000 ug | ORAL_TABLET | Freq: Every day | ORAL | 0 refills | Status: AC
  Filled 2024-04-10: qty 30, 30d supply, fill #0

## 2024-04-10 MED ORDER — POLYETHYLENE GLYCOL 3350 17 GM/SCOOP PO POWD
17.0000 g | Freq: Two times a day (BID) | ORAL | Status: DC
Start: 2024-04-10 — End: 2024-05-14

## 2024-04-10 NOTE — Inpatient Diabetes Management (Signed)
 Inpatient Diabetes Program Recommendations  AACE/ADA: New Consensus Statement on Inpatient Glycemic Control (2015)  Target Ranges:  Prepandial:   less than 140 mg/dL      Peak postprandial:   less than 180 mg/dL (1-2 hours)      Critically ill patients:  140 - 180 mg/dL    Latest Reference Range & Units 04/09/24 00:01 04/09/24 03:42 04/09/24 07:29 04/09/24 08:25 04/09/24 11:39 04/09/24 16:40 04/09/24 20:38  Glucose-Capillary 70 - 99 mg/dL 161 (H) 096 (H) 60 (L) 120 (H) 217 (H) 248 (H) 39 (LL)    Latest Reference Range & Units 04/09/24 21:33 04/09/24 23:30 04/10/24 04:01 04/10/24 04:39 04/10/24 07:19  Glucose-Capillary 70 - 99 mg/dL 045 (H) 409 (H) 64 (L) 97 102 (H)  (H): Data is abnormally high (L): Data is abnormally low    History: Type 1 diabetes  Home DM Meds: Levemir  8 units BID     Humalog 1-7 units as needed     Dexcom G7  Current Orders: Semglee  6 units BID      Novolog  0-6 units Q4H    MD- Note Severe Hypoglycemia last PM Mild Hypoglycemia this AM  Please consider reducing the Semglee  to 5 units BID    --Will follow patient during hospitalization--  Langston Pippins RN, MSN, CDCES Diabetes Coordinator Inpatient Glycemic Control Team Team Pager: [phone] (8a-5p)

## 2024-04-10 NOTE — Plan of Care (Signed)
  Problem: Clinical Measurements: Goal: Ability to maintain clinical measurements within normal limits will improve Outcome: Adequate for Discharge Goal: Cardiovascular complication will be avoided Outcome: Adequate for Discharge   Problem: Nutrition: Goal: Adequate nutrition will be maintained Outcome: Adequate for Discharge   Problem: Coping: Goal: Level of anxiety will decrease Outcome: Adequate for Discharge   Problem: Elimination: Goal: Will not experience complications related to bowel motility Outcome: Adequate for Discharge Goal: Will not experience complications related to urinary retention Outcome: Adequate for Discharge   Problem: Pain Managment: Goal: General experience of comfort will improve and/or be controlled Outcome: Adequate for Discharge   Problem: Safety: Goal: Ability to remain free from injury will improve Outcome: Adequate for Discharge   Problem: Skin Integrity: Goal: Risk for impaired skin integrity will decrease Outcome: Adequate for Discharge   Problem: Education: Goal: Ability to describe self-care measures that may prevent or decrease complications (Diabetes Survival Skills Education) will improve Outcome: Adequate for Discharge Goal: Individualized Educational Video(s) Outcome: Adequate for Discharge   Problem: Coping: Goal: Ability to adjust to condition or change in health will improve Outcome: Adequate for Discharge   Problem: Fluid Volume: Goal: Ability to maintain a balanced intake and output will improve Outcome: Adequate for Discharge   Problem: Health Behavior/Discharge Planning: Goal: Ability to identify and utilize available resources and services will improve Outcome: Adequate for Discharge Goal: Ability to manage health-related needs will improve Outcome: Adequate for Discharge   Problem: Metabolic: Goal: Ability to maintain appropriate glucose levels will improve Outcome: Adequate for Discharge   Problem:  Nutritional: Goal: Maintenance of adequate nutrition will improve Outcome: Adequate for Discharge Goal: Progress toward achieving an optimal weight will improve Outcome: Adequate for Discharge   Problem: Skin Integrity: Goal: Risk for impaired skin integrity will decrease Outcome: Adequate for Discharge   Problem: Tissue Perfusion: Goal: Adequacy of tissue perfusion will improve Outcome: Adequate for Discharge

## 2024-04-10 NOTE — Progress Notes (Signed)
 Pts CBG: 39, given 8 oz of apple juice, rechecked after treatment CBG: 109. Notified [facility] floor coverage Lorre Rosin, NP

## 2024-04-10 NOTE — Discharge Summary (Signed)
 Physician Discharge Summary   Patient: Mary Erickson MRN: [id number] DOB: 17-Oct-1966  Admit date:     04/08/2024  Discharge date: 04/10/24  Discharge Physician: Cherylle Corwin   PCP: Nita Bast, NP   Recommendations at discharge:    Follow up with PCP in 1-2 weeks Recommend rechecking TSH in 4 weeks. Please note levothyroxine  dose was increased to 200mcg this admit  Discharge Diagnoses: Principal Problem:   Epigastric abdominal pain Active Problems:   Type 1 diabetes mellitus (HCC)   Constipation   Adenocarcinoma of upper lobe of right lung (HCC)   Nausea and vomiting  Resolved Problems:   * No resolved hospital problems. *  Hospital Course: 58 y.o. female with medical history significant of stage III adenocarcinoma of the lung, Sjogren disease, type 1 diabetes, GERD, ADHD, rheumatoid arthritis, Hashimoto's thyroiditis, hypothyroidism, hyperlipidemia, internal carotid aneurysm, left eye blindness, migraine headaches, seizures, hyponatremia, IBS, melanoma, neurogenic bladder does self-catheterization, hepatitis B, chronic constipation.  Patient was recently admitted 5/11-5/15 for sepsis due to postobstructive pneumonia with hemoptysis.  Patient decided not to pursue therapy for her cancer.  Reportedly not a good candidate for immunotherapy due to underlying multiple autoimmune diseases.  Currently on palliative care and planning to transition to home hospice care.   Patient is presenting today with complaints of epigastric abdominal pain, nausea, and vomiting.  States she was recently admitted to the hospital for pneumonia and had a coughing fit for 2 days.  Cough has now subsided and no longer having shortness of breath.  However, for the past 2 days she is having epigastric abdominal pain, nausea, and vomiting.  She has not been able to take her home medications due to vomiting.  Patient thinks her symptoms are related to hiatal hernia which she was diagnosed with a year ago.  In  addition, having severe acid reflux.  She is also constipated but did have bowel movements for the past 2 days.  Denies fevers.  Denies any chest pain/pressure or pleuritic chest pain.  Denies any dysphagia or odynophagia.  She has been able to drink sips of water in the ED.  Patient states she was told she was not a candidate for surgery for her lung cancer, as such, she had decided not to pursue chemotherapy or radiation.  Also she was told that she has not a good candidate for immunotherapy due to underlying multiple autoimmune diseases.   ED Course: EMS gave 300 mL normal saline and Zofran  4 mg.  Vital signs stable in the ED.  Labs showing WBC count 14.2, hemoglobin 11.7 (stable), platelet count 579k, sodium 128, chloride 95, glucose 198, creatinine 0.7, normal lipase and LFTs, troponin negative x 2, lactic acid normal x 2, UA not suggestive of infection.  CT chest abdomen pelvis with contrast showing no central PE but limited evaluation more distally due to artifact with questionable developing right upper lobe segmental PE.  No CT evidence of right heart strain or pulmonary infarction.  Also showing grossly stable in size 12 x 12 cm centrally necrotic heterogeneous right upper lobe mass with associated mass effect on the right mainstem bronchi.  Also showing stool throughout the colon consistent with constipation. Patient was given Tylenol , Zofran , and 2 L normal saline.    Assessment and Plan: Epigastric abdominal pain, nausea, vomiting Normal lipase and LFTs.  CT abdomen pelvis showing stool throughout the colon consistent with constipation.  Patient is endorsing severe acid reflux.  EGD done in September 2024 showing a 2 cm  hiatal hernia and gastritis.   -CT abd reviewed. Extensive stool throughout the colon -Recommend more aggressive bowel regimen, per below   Constipation -On opiates PTA for chronic pains -Resumed Linzess , MiraLAX   -Given extensiveness of stool burden, pt was given SMOG  enema x 1 with extremely good result -Recommend stool softner with BID or TID scheduled miralax  moving forward   Acute PE ruled out CT chest with contrast showing no central PE but limited evaluation more distally due to artifact with questionable developing right upper lobe segmental PE.  No CT evidence of right heart strain or pulmonary infarction.  Patient is not endorsing chest pain or dyspnea.  Not tachycardic, tachypneic, or hypoxic.  -D-dimer is expected to be elevated given malignancy -Reviewed CT with Pulmonary on service. No evidence of PE noted   Stage III adenocarcinoma of the lung Patient had decided not to pursue therapy for her cancer.  Reportedly not a good candidate for immunotherapy due to underlying multiple autoimmune diseases.  CT showing grossly stable in size 12 x 12 cm centrally necrotic heterogeneous right upper lobe mass with associated mass effect on the right mainstem bronchi.  Not dyspneic or hypoxic.  Seen by palliative care during recent hospital admission and was given referral to hospice at home.   Leukocytosis No infectious signs or symptoms.   -stable   Thrombocytosis Platelet count higher compared to previous labs, -Remains stable   Chronic anemia Hemoglobin higher compared to recent labs.  Monitor CBC.   Hyponatremia Sodium when corrected for hyperglycemia is close to baseline.  Continue IV fluid hydration and monitor sodium level. -Improved   Type 1 diabetes Glucose 198.  A1c 6.9 on 12/08/2023.  Continue home basal insulin  and placed on sensitive sliding scale insulin  every 4 hours for now until her oral intake improves.   ADHD Continue methylphenidate .   Rheumatoid arthritis Continue prednisone .   Hypothyroidism TSH is elevated at 43.516 -Increased levothyroxine  to -recommend recheck TSH in 4 weeks   History of neurogenic bladder Patient does self catheterization at home, continue.    Consultants:  Procedures performed:    Disposition: Home Diet recommendation:  Carb modified diet DISCHARGE MEDICATION: Allergies as of 04/10/2024       Reactions   Doxycycline Other (See Comments)   Elevates liver enzymes   Aspirin  Other (See Comments)   Can take 81 mg but has to be coated; No other Aspirin  due to bleeding in stools from ulcers   Nsaids Other (See Comments)   GI issues    Amoxicillin -pot Clavulanate Nausea Only   Gluten Meal    Avoid due to autoimmune issues    Lactose    Avoid due to autoimmune issues    Codeine Nausea And Vomiting   Hydrocodone -acetaminophen  Nausea And Vomiting   Oxycodone  Nausea And Vomiting   Sulfa Antibiotics Nausea And Vomiting   Sulfasalazine Nausea And Vomiting        Medication List     STOP taking these medications    ciprofloxacin  500 MG tablet Commonly known as: CIPRO        TAKE these medications    Accu-Chek Guide test strip Generic drug: glucose blood 1 each by Other route as needed.   acetaminophen  500 MG tablet Commonly known as: TYLENOL  Take 1,000 mg by mouth every 6 (six) hours as needed for moderate pain.   Advair  HFA 115-21 MCG/ACT inhaler Generic drug: fluticasone-salmeterol Inhale 2 puffs into the lungs 2 (two) times daily.   B-12 3000 MCG Caps  Take 3,000 mcg by mouth daily.   BENADRYL  ALLERGY PO Take 25 mg by mouth at bedtime.   benzocaine  10 % mucosal gel Commonly known as: ORAJEL Use as directed 1 Application in the mouth or throat as needed for mouth pain.   [employer] ALLERGY EYE DROPS OP Place 1 drop into both eyes 2 (two) times daily.   Dexcom G7 Sensor Misc Inject 1 applicator into the skin See admin instructions. Every 10 days   diclofenac  Sodium 1 % Gel Commonly known as: VOLTAREN  Apply 2 g topically 4 (four) times daily. What changed:  when to take this reasons to take this   fentaNYL  12 MCG/HR Commonly known as: DURAGESIC  Place 1 patch onto the skin every 3 (three) days.   HYDROcodone  bit-homatropine 5-1.5 MG/5ML  syrup Commonly known as: HYCODAN Take 5 mLs by mouth every 4 (four) hours as needed for cough.   ibuprofen  400 MG tablet Commonly known as: ADVIL  Take 400 mg by mouth every 6 (six) hours as needed for moderate pain (pain score 4-6).   Insulin  lispro 100 UNIT/ML Commonly known as: HUMALOG Junior KwikPen Inject 1-7 Units into the skin as needed (high blood sugar).   Levemir  FlexTouch 100 UNIT/ML FlexTouch Pen Generic drug: insulin  detemir Inject 5 Units into the skin 2 (two) times daily. What changed: how much to take   levothyroxine  200 MCG tablet Commonly known as: SYNTHROID  Take 1 tablet (200 mcg total) by mouth daily at 6 (six) AM. Start taking on: Apr 11, 2024 What changed:  medication strength how much to take when to take this   linaclotide  145 MCG Caps capsule Commonly known as: Linzess  Take 1 capsule (145 mcg total) by mouth daily before breakfast.   LORazepam  1 MG tablet Commonly known as: ATIVAN  Take 1 tablet (1 mg total) by mouth every 4 (four) hours as needed for anxiety or sleep (or nausea).   Melatonin 10-10 MG Tbcr Take 10 mg by mouth at bedtime.   methylphenidate  36 MG CR tablet Commonly known as: CONCERTA  Take 36 mg by mouth every morning.   morphine  CONCENTRATE 10 mg / 0.5 ml concentrated solution Place 0.5 mLs (10 mg total) under the tongue every 2 (two) hours as needed for moderate pain (pain score 4-6), severe pain (pain score 7-10), anxiety or shortness of breath (breakthrough pain).   multivitamins ther. w/minerals Tabs tablet Take 1 tablet by mouth daily.   nystatin cream Commonly known as: MYCOSTATIN Apply 1 Application topically 2 (two) times daily as needed for dry skin.   omeprazole  20 MG capsule Commonly known as: PRILOSEC Take 1 capsule (20 mg total) by mouth 2 (two) times daily before a meal.   ondansetron  4 MG disintegrating tablet Commonly known as: Zofran  ODT Take 1 tablet (4 mg total) by mouth every 6 (six) hours as needed for  nausea or vomiting.   polyethylene glycol powder 17 GM/SCOOP powder Commonly known as: GLYCOLAX /MIRALAX  Take 17 g by mouth 2 (two) times daily. What changed:  when to take this reasons to take this   predniSONE  5 MG tablet Commonly known as: DELTASONE  Take 1 tablet (5 mg total) by mouth 2 (two) times daily.   SYSTANE OP Place 1 drop into both eyes 3 (three) times daily as needed (dry eyes).   Vitamin D3 125 MCG (5000 UT) Caps Take 5,000 Units by mouth daily.        Follow-up Information     Nita Bast, NP Follow up in 2 week(s).  Specialty: Nurse Practitioner Why: Hospital follow up Contact information: [address] [place] [address] [phone]                Discharge Exam: Cleavon Curls Weights   04/08/24 1721 04/09/24 0414  Weight: 58.7 kg 55.5 kg   General exam: Awake, laying in bed, in nad Respiratory system: Normal respiratory effort, no wheezing Cardiovascular system: regular rate, s1, s2 Gastrointestinal system: Soft, nondistended, positive BS Central nervous system: CN2-12 grossly intact, strength intact Extremities: Perfused, no clubbing Skin: Normal skin turgor, no notable skin lesions seen Psychiatry: Mood normal // no visual hallucinations   Condition at discharge: fair  The results of significant diagnostics from this hospitalization (including imaging, microbiology, ancillary and laboratory) are listed below for reference.   Imaging Studies: CT CHEST ABDOMEN PELVIS W CONTRAST Result Date: 04/08/2024 CLINICAL DATA:  concern for obstruction or hiatal hernia complication. EXAM: CT CHEST, ABDOMEN, AND PELVIS WITH CONTRAST TECHNIQUE: Multidetector CT imaging of the chest, abdomen and pelvis was performed following the standard protocol during bolus administration of intravenous contrast. RADIATION DOSE REDUCTION: This exam was performed according to the departmental dose-optimization program which includes automated exposure  control, adjustment of the mA and/or kV according to patient size and/or use of iterative reconstruction technique. CONTRAST:  OMNIPAQUE  IOHEXOL  300 MG/ML  SOLN COMPARISON:  None Available. FINDINGS: CT CHEST FINDINGS Cardiovascular: Normal heart size. No significant pericardial effusion. The thoracic aorta is normal in caliber. No atherosclerotic plaque of the thoracic aorta. No coronary artery calcifications. No central pulmonary embolus. Limited evaluation more distally due to artifact with question developing right upper lobe segmental pulmonary embolus. Mediastinum/Nodes: No enlarged mediastinal, hilar, or axillary lymph nodes. Thyroid  gland, trachea, and esophagus demonstrate no significant findings. Lungs/Pleura: Centrilobular emphysematous changes. No focal consolidation. No pulmonary nodule. Grossly stable in size of a 12 x 12 cm centrally necrotic heterogeneous right upper lobe mass. Similar-appearing surrounding ground-glass airspace opacities and interlobular septal thickening. Redemonstration of mass effect on the right upper lobe and right mainstem bronchi. No pleural effusion. No pneumothorax. Musculoskeletal: No chest wall abnormality. No suspicious lytic or blastic osseous lesions. No acute displaced fracture. Multilevel degenerative changes of the spine. CT ABDOMEN PELVIS FINDINGS Hepatobiliary: A 3.7 x 3.4 cm right posterior hepatic lobe hepatic hemangioma. Several other pericentimeter hypodensities of the right posterior hepatic lobe suggestive of flash filling hemangiomas (2: 55, 60). Post cholecystectomy. No biliary dilatation. Pancreas: No focal lesion. Normal pancreatic contour. No surrounding inflammatory changes. No main pancreatic ductal dilatation. Spleen: Normal in size without focal abnormality. Adrenals/Urinary Tract: No adrenal nodule bilaterally. Bilateral kidneys enhance symmetrically. No hydronephrosis. No hydroureter. The urinary bladder is unremarkable.+ On delayed imaging,  there is no urothelial wall thickening and there are no filling defects in the opacified portions of the bilateral collecting systems or ureters. Stomach/Bowel: Stomach is within normal limits. No evidence of bowel wall thickening or dilatation. Stool throughout the colon. Appendix appears normal. Vascular/Lymphatic: No abdominal aorta or iliac aneurysm. Mild atherosclerotic plaque of the aorta and its branches. Lymph node dissection of the pelvis. No abdominal, pelvic, or inguinal lymphadenopathy. Reproductive: Uterus and bilateral adnexa are unremarkable. Other: No intraperitoneal free fluid. No intraperitoneal free gas. No organized fluid collection. Musculoskeletal: No abdominal wall hernia or abnormality. No suspicious lytic or blastic osseous lesions. No acute displaced fracture. Multilevel degenerative changes of the spine. IMPRESSION: 1. No central pulmonary embolus. Limited evaluation more distally due to artifact with question developing right upper lobe segmental pulmonary  embolus. No right heart strain or pulmonary infarction. 2. Grossly stable in size of a 12 x 12 cm centrally necrotic heterogeneous right upper lobe mass. Associated mass effect on the right mainstem bronchi. 3. Stool throughout the colon consistent with constipation. 4.  Emphysema (ICD10-J43.9). Electronically Signed   By: Morgane  Naveau M.D.   On: 04/08/2024 20:58   CT Angio Chest Pulmonary Embolism (PE) W or WO Contrast Result Date: 03/22/2024 CLINICAL DATA:  Suspected pulmonary embolism. EXAM: CT ANGIOGRAPHY CHEST WITH CONTRAST TECHNIQUE: Multidetector CT imaging of the chest was performed using the standard protocol during bolus administration of intravenous contrast. Multiplanar CT image reconstructions and MIPs were obtained to evaluate the vascular anatomy. RADIATION DOSE REDUCTION: This exam was performed according to the departmental dose-optimization program which includes automated exposure control, adjustment of the mA  and/or kV according to patient size and/or use of iterative reconstruction technique. CONTRAST:  75mL OMNIPAQUE  IOHEXOL  350 MG/ML SOLN COMPARISON:  January 31, 2024 FINDINGS: Cardiovascular: The segmental and subsegmental pulmonary arteries are limited in evaluation secondary to suboptimal opacification with intravenous contrast and areas of overlying artifact. Truncated upper lobe and middle lobe branches of the right pulmonary artery are again seen. Acute pulmonary embolism is not clearly identified. There is mild cardiomegaly. No pericardial effusion. Mediastinum/Nodes: No enlarged mediastinal, hilar, or axillary lymph nodes. Thyroid  gland, trachea, and esophagus demonstrate no significant findings. Lungs/Pleura: A very large, approximately 12.3 cm x 10.2 cm x 12.0 cm heterogeneous low-attenuation mass is seen within the posterior aspect of the mid and upper right lung (measured 10.1 cm x 10.8 cm on the prior study). Adjacent, moderate severity anterior right upper lobe atelectasis and/or postobstructive infiltrate is seen. Mild lingular and anteromedial right middle lobe linear scarring and/or atelectasis is also present. There is a small right pleural effusion. No pneumothorax is identified. Upper Abdomen: No acute abnormality. Musculoskeletal: No chest wall abnormality. No acute or significant osseous findings. Review of the MIP images confirms the above findings. IMPRESSION: 1. Limited evaluation of the segmental and subsegmental pulmonary arteries, as described above, without definite evidence of acute pulmonary embolism. 2. Truncated upper lobe and middle lobe branches of the right pulmonary artery, likely secondary to the presence of the large right lung mass. 3. Very large, heterogeneous low-attenuation mass within the posterior aspect of the mid and upper right lung, as described above, with adjacent moderate severity anterior right upper lobe atelectasis and/or postobstructive infiltrate. 4. Small right  pleural effusion. 5. Mild lingular and anteromedial right middle lobe linear scarring and/or atelectasis. Electronically Signed   By: Virgle Grime M.D.   On: 03/22/2024 21:46    Microbiology: Results for orders placed or performed during the hospital encounter of 03/22/24  Resp panel by RT-PCR (RSV, Flu A&B, Covid) Anterior Nasal Swab     Status: None   Collection Time: 03/22/24  7:10 PM   Specimen: Anterior Nasal Swab  Result Value Ref Range Status   SARS Coronavirus 2 by RT PCR NEGATIVE NEGATIVE Final    Comment: (NOTE) SARS-CoV-2 target nucleic acids are NOT DETECTED.  The SARS-CoV-2 RNA is generally detectable in upper respiratory specimens during the acute phase of infection. The lowest concentration of SARS-CoV-2 viral copies this assay can detect is 138 copies/mL. A negative result does not preclude SARS-Cov-2 infection and should not be used as the sole basis for treatment or other patient management decisions. A negative result may occur with  improper specimen collection/handling, submission of specimen other than nasopharyngeal swab, presence of  viral mutation(s) within the areas targeted by this assay, and inadequate number of viral copies(<138 copies/mL). A negative result must be combined with clinical observations, patient history, and epidemiological information. The expected result is Negative.  Fact Sheet for Patients:  BloggerCourse.com  Fact Sheet for Healthcare Providers:  SeriousBroker.it  This test is no t yet approved or cleared by the [place]  FDA and  has been authorized for detection and/or diagnosis of SARS-CoV-2 by FDA under an Emergency Use Authorization (EUA). This EUA will remain  in effect (meaning this test can be used) for the duration of the COVID-19 declaration under Section 564(b)(1) of the Act, 21 U.S.C.section 360bbb-3(b)(1), unless the authorization is terminated  or revoked  sooner.       Influenza A by PCR NEGATIVE NEGATIVE Final   Influenza B by PCR NEGATIVE NEGATIVE Final    Comment: (NOTE) The Xpert Xpress SARS-CoV-2/FLU/RSV plus assay is intended as an aid in the diagnosis of influenza from Nasopharyngeal swab specimens and should not be used as a sole basis for treatment. Nasal washings and aspirates are unacceptable for Xpert Xpress SARS-CoV-2/FLU/RSV testing.  Fact Sheet for Patients: BloggerCourse.com  Fact Sheet for Healthcare Providers: SeriousBroker.it  This test is not yet approved or cleared by the [place]  FDA and has been authorized for detection and/or diagnosis of SARS-CoV-2 by FDA under an Emergency Use Authorization (EUA). This EUA will remain in effect (meaning this test can be used) for the duration of the COVID-19 declaration under Section 564(b)(1) of the Act, 21 U.S.C. section 360bbb-3(b)(1), unless the authorization is terminated or revoked.     Resp Syncytial Virus by PCR NEGATIVE NEGATIVE Final    Comment: (NOTE) Fact Sheet for Patients: BloggerCourse.com  Fact Sheet for Healthcare Providers: SeriousBroker.it  This test is not yet approved or cleared by the [place]  FDA and has been authorized for detection and/or diagnosis of SARS-CoV-2 by FDA under an Emergency Use Authorization (EUA). This EUA will remain in effect (meaning this test can be used) for the duration of the COVID-19 declaration under Section 564(b)(1) of the Act, 21 U.S.C. section 360bbb-3(b)(1), unless the authorization is terminated or revoked.  Performed at [facility], [address] [place], [place] [address]   Culture, blood (routine x 2) Call MD if unable to obtain prior to antibiotics being given     Status: None   Collection Time: 03/23/24  1:11 AM   Specimen: BLOOD  Result Value Ref Range Status   Specimen  Description   Final    BLOOD BLOOD RIGHT ARM Performed at [facility], [address] [place], [place] [address]    Special Requests   Final    BOTTLES DRAWN AEROBIC AND ANAEROBIC Blood Culture results may not be optimal due to an inadequate volume of blood received in culture bottles Performed at [facility], [address], [place], [place] [address]    Culture   Final    NO GROWTH 5 DAYS Performed at [facility], [address] [place], [place] [address]    Report Status 03/28/2024 FINAL  Final  Culture, blood (routine x 2) Call MD if unable to obtain prior to antibiotics being given     Status: None   Collection Time: 03/23/24  1:11 AM   Specimen: BLOOD  Result Value Ref Range Status   Specimen Description   Final    BLOOD BLOOD RIGHT ARM Performed at [facility], [address], [place], [place] [address]    Special Requests   Final    BOTTLES DRAWN AEROBIC AND ANAEROBIC Blood Culture results may not be optimal due to an inadequate volume of blood received in culture bottles Performed at [facility], [address], [place], [place] [address]    Culture   Final    NO GROWTH 5 DAYS Performed at [facility], [address] [place], [place] [address]    Report Status 03/28/2024 FINAL  Final  MRSA Next Gen by PCR, Nasal     Status: None   Collection Time: 03/23/24  9:10 AM   Specimen: Nasal Mucosa; Nasal Swab  Result Value Ref Range Status   MRSA by PCR Next Gen NOT DETECTED NOT DETECTED Final    Comment: (NOTE) The GeneXpert MRSA Assay (FDA approved for NASAL specimens only), is one component of a comprehensive MRSA colonization surveillance program. It is not intended to diagnose MRSA infection nor to guide or monitor treatment for MRSA infections. Test performance is not FDA approved in patients less than 13 years old. Performed at [facility], [address] [place], [place] [address]     Labs: CBC: Recent Labs  Lab 04/08/24 1728 04/09/24 0331  WBC 14.2* 13.1*  HGB 11.7* 10.6*  HCT 37.7 34.1*  MCV 87.1 87.7  PLT 579* 587*   Basic Metabolic Panel: Recent Labs  Lab 04/08/24 1728 04/09/24 0331  NA 128* 130*  K 4.0 3.7  CL 95* 101  CO2 22 23  GLUCOSE 198* 123*  BUN 12 10  CREATININE 0.70 0.76  CALCIUM  8.5* 8.4*   Liver Function Tests: Recent Labs  Lab 04/08/24 1728  AST 33  ALT 20  ALKPHOS 116  BILITOT 0.7  PROT 7.1  ALBUMIN 3.1*   CBG: Recent Labs  Lab 04/09/24 2330 04/10/24 0401 04/10/24 0439 04/10/24 0719 04/10/24 1148  GLUCAP 171* 64* 97 102* 99    Discharge time spent: less than 30 minutes.  Signed: Cherylle Corwin, MD [facility] 04/10/2024

## 2024-04-10 NOTE — Progress Notes (Signed)
 CBG: 64, given 4 oz of apple juice, rechecked 15 minutes after treatment, CBG 97. Notified [facility] floor coverage Lorre Rosin, NP.

## 2024-04-10 NOTE — Plan of Care (Signed)
   Problem: Coping: Goal: Level of anxiety will decrease Outcome: Progressing   Problem: Pain Managment: Goal: General experience of comfort will improve and/or be controlled Outcome: Progressing   Problem: Safety: Goal: Ability to remain free from injury will improve Outcome: Progressing

## 2024-04-11 ENCOUNTER — Other Ambulatory Visit: Payer: Self-pay

## 2024-04-11 ENCOUNTER — Encounter (HOSPITAL_COMMUNITY): Payer: Self-pay | Admitting: *Deleted

## 2024-04-11 ENCOUNTER — Emergency Department (HOSPITAL_COMMUNITY)
Admission: EM | Admit: 2024-04-11 | Discharge: 2024-04-11 | Disposition: A | Discharge status: Home / Self Care | Attending: Emergency Medicine | Admitting: Emergency Medicine

## 2024-04-11 DIAGNOSIS — E860 Dehydration: Secondary | ICD-10-CM | POA: Insufficient documentation

## 2024-04-11 DIAGNOSIS — E039 Hypothyroidism, unspecified: Secondary | ICD-10-CM | POA: Insufficient documentation

## 2024-04-11 DIAGNOSIS — D72829 Elevated white blood cell count, unspecified: Secondary | ICD-10-CM | POA: Diagnosis not present

## 2024-04-11 DIAGNOSIS — Z794 Long term (current) use of insulin: Secondary | ICD-10-CM | POA: Diagnosis not present

## 2024-04-11 DIAGNOSIS — E119 Type 2 diabetes mellitus without complications: Secondary | ICD-10-CM | POA: Diagnosis not present

## 2024-04-11 DIAGNOSIS — R112 Nausea with vomiting, unspecified: Secondary | ICD-10-CM

## 2024-04-11 DIAGNOSIS — E871 Hypo-osmolality and hyponatremia: Secondary | ICD-10-CM | POA: Diagnosis not present

## 2024-04-11 DIAGNOSIS — K219 Gastro-esophageal reflux disease without esophagitis: Secondary | ICD-10-CM | POA: Diagnosis not present

## 2024-04-11 LAB — COMPREHENSIVE METABOLIC PANEL WITH GFR
ALT: 19 U/L (ref 0–44)
AST: 27 U/L (ref 15–41)
Albumin: 2.8 g/dL — ABNORMAL LOW (ref 3.5–5.0)
Alkaline Phosphatase: 111 U/L (ref 38–126)
Anion gap: 8 (ref 5–15)
BUN: 16 mg/dL (ref 6–20)
CO2: 29 mmol/L (ref 22–32)
Calcium: 8.9 mg/dL (ref 8.9–10.3)
Chloride: 93 mmol/L — ABNORMAL LOW (ref 98–111)
Creatinine, Ser: 0.73 mg/dL (ref 0.44–1.00)
GFR, Estimated: >60 mL/min (ref >60)
Glucose, Bld: 122 mg/dL — ABNORMAL HIGH (ref 70–99)
Potassium: 4.2 mmol/L (ref 3.5–5.1)
Sodium: 130 mmol/L — ABNORMAL LOW (ref 135–145)
Total Bilirubin: 0.3 mg/dL (ref 0.0–1.2)
Total Protein: 7 g/dL (ref 6.5–8.1)

## 2024-04-11 LAB — MAGNESIUM: Magnesium: 1.9 mg/dL (ref 1.7–2.4)

## 2024-04-11 LAB — URINALYSIS, ROUTINE W REFLEX MICROSCOPIC
Bilirubin Urine: NEGATIVE
Glucose, UA: NEGATIVE mg/dL
Hgb urine dipstick: NEGATIVE
Ketones, ur: NEGATIVE mg/dL
Leukocytes,Ua: NEGATIVE
Nitrite: NEGATIVE
Protein, ur: NEGATIVE mg/dL
Specific Gravity, Urine: 1.003 — ABNORMAL LOW (ref 1.005–1.030)
pH: 8 (ref 5.0–8.0)

## 2024-04-11 LAB — CBC WITH DIFFERENTIAL/PLATELET
Abs Immature Granulocytes: 0.08 10*3/uL — ABNORMAL HIGH (ref 0.00–0.07)
Basophils Absolute: 0.1 10*3/uL (ref 0.0–0.1)
Basophils Relative: 0 %
Eosinophils Absolute: 0.1 10*3/uL (ref 0.0–0.5)
Eosinophils Relative: 0 %
HCT: 34.7 % — ABNORMAL LOW (ref 36.0–46.0)
Hemoglobin: 10.9 g/dL — ABNORMAL LOW (ref 12.0–15.0)
Immature Granulocytes: 1 %
Lymphocytes Relative: 8 %
Lymphs Abs: 1.2 10*3/uL (ref 0.7–4.0)
MCH: 27.3 pg (ref 26.0–34.0)
MCHC: 31.4 g/dL (ref 30.0–36.0)
MCV: 87 fL (ref 80.0–100.0)
Monocytes Absolute: 0.7 10*3/uL (ref 0.1–1.0)
Monocytes Relative: 5 %
Neutro Abs: 13.1 10*3/uL — ABNORMAL HIGH (ref 1.7–7.7)
Neutrophils Relative %: 86 %
Platelets: 492 10*3/uL — ABNORMAL HIGH (ref 150–400)
RBC: 3.99 MIL/uL (ref 3.87–5.11)
RDW: 15 % (ref 11.5–15.5)
WBC: 15.3 10*3/uL — ABNORMAL HIGH (ref 4.0–10.5)
nRBC: 0 % (ref 0.0–0.2)

## 2024-04-11 LAB — BLOOD GAS, VENOUS
Acid-Base Excess: 6.6 mmol/L — ABNORMAL HIGH (ref 0.0–2.0)
Bicarbonate: 31.9 mmol/L — ABNORMAL HIGH (ref 20.0–28.0)
O2 Saturation: 36 %
Patient temperature: 37
pCO2, Ven: 48 mmHg (ref 44–60)
pH, Ven: 7.43 (ref 7.25–7.43)
pO2, Ven: <31 mmHg — CL (ref 32–45)

## 2024-04-11 LAB — I-STAT CG4 LACTIC ACID, ED: Lactic Acid, Venous: 0.7 mmol/L (ref 0.5–1.9)

## 2024-04-11 LAB — LIPASE, BLOOD: Lipase: 30 U/L (ref 11–51)

## 2024-04-11 LAB — BETA-HYDROXYBUTYRIC ACID: Beta-Hydroxybutyric Acid: 0.17 mmol/L (ref 0.05–0.27)

## 2024-04-11 MED ORDER — PROMETHAZINE HCL 25 MG PO TABS
25.0000 mg | ORAL_TABLET | Freq: Four times a day (QID) | ORAL | 0 refills | Status: AC | PRN
Start: 2024-04-11 — End: ?

## 2024-04-11 MED ORDER — ALUM & MAG HYDROXIDE-SIMETH 200-200-20 MG/5ML PO SUSP
30.0000 mL | Freq: Once | ORAL | Status: AC
  Administered 2024-04-11: 30 mL via ORAL
  Filled 2024-04-11: qty 30

## 2024-04-11 MED ORDER — PROMETHAZINE HCL 25 MG RE SUPP: 25.0000 mg | Freq: Four times a day (QID) | RECTAL | 0 refills | Status: DC | PRN

## 2024-04-11 MED ORDER — SODIUM CHLORIDE 0.9 % IV BOLUS
1000.0000 mL | Freq: Once | INTRAVENOUS | Status: AC
Start: 2024-04-11 — End: 2024-04-11
  Administered 2024-04-11: 1000 mL via INTRAVENOUS

## 2024-04-11 MED ORDER — SODIUM CHLORIDE 0.9 % IV SOLN
12.5000 mg | Freq: Once | INTRAVENOUS | Status: DC
  Filled 2024-04-11: qty 0.5

## 2024-04-11 MED ORDER — DEXLANSOPRAZOLE 30 MG PO CPDR: 30.0000 mg | DELAYED_RELEASE_CAPSULE | Freq: Every day | ORAL | 0 refills | Status: DC

## 2024-04-11 MED ORDER — SODIUM CHLORIDE 0.9 % IV SOLN
12.5000 mg | INTRAVENOUS | Status: AC
  Administered 2024-04-11 (×2): 12.5 mg via INTRAVENOUS
  Filled 2024-04-11: qty 0.5
  Filled 2024-04-11: qty 12.5

## 2024-04-11 MED ORDER — SODIUM CHLORIDE 0.9 % IV SOLN
25.0000 mg | Freq: Once | INTRAVENOUS | Status: DC
  Filled 2024-04-11: qty 1

## 2024-04-11 NOTE — ED Triage Notes (Addendum)
 BIB [facility] from home for recurrent NV, seen here yesterday for the same and d/c'd. No relief from zofran  last night and this am. No zofran  in last 6hrs. Arrives alert, NAD, calm, interactive, active dry heaves. VSS. EDP present no arrival. Lives alone. C/o chronic lung tumor pain. Took tylenol  2 hrs PTA. H/o lung CA, DM1, and low thyroid . CBG 168.

## 2024-04-11 NOTE — ED Notes (Addendum)
 Pt performed self cath stating she released of urine but forgot to obtain the sample. Pt denies nausea at this time.  EDP made aware & at bedside to talk with patient.

## 2024-04-11 NOTE — ED Provider Notes (Signed)
 [place] EMERGENCY DEPARTMENT AT [facility] Provider Note   CSN: [id number] Arrival date & time: 04/11/24  1623     History  Chief Complaint  Patient presents with   Emesis    Mary Erickson is a 58 y.o. female.  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  Pt was admitted from 5/28-yesterday for epigastric abd pain and n/v.  It was thought to be due to GERD and constipation.  She went home and started vomiting again.  She's tried zofran  without any improvement in sx.  She said her tumor pain is ok with tylenol .  She's worried about taking narcotics which would cause the constipation again.  No abd pain.       Home Medications Prior to Admission medications   Medication Sig Start Date End Date Taking? Authorizing Provider  Dexlansoprazole (DEXILANT) 30 MG capsule DR Take 1 capsule (30 mg total) by mouth daily. 04/11/24  Yes Sueellen Emery, MD  promethazine  (PHENERGAN ) 25 MG suppository Place 1 suppository (25 mg total) rectally every 6 (six) hours as needed for nausea or vomiting. 04/11/24  Yes Nilo Fallin, MD  promethazine  (PHENERGAN ) 25 MG tablet Take 1 tablet (25 mg total) by mouth every 6 (six) hours as needed for nausea or vomiting. 04/11/24  Yes Sueellen Emery, MD  acetaminophen  (TYLENOL ) 500 MG tablet Take 1,000 mg by mouth every 6 (six) hours as needed for moderate pain.    [provider]  benzocaine  (ORAJEL) 10 % mucosal gel Use as directed 1 Application in the mouth or throat as needed for mouth pain.    [provider]  Cholecalciferol  (VITAMIN D3) 125 MCG (5000 UT) capsule Take 5,000 Units by mouth daily.    [provider]  Continuous Glucose Sensor (DEXCOM G7 SENSOR) MISC Inject 1 applicator into  the skin See admin instructions. Every 10 days 11/27/23   [provider]  Cyanocobalamin  (B-12) 3000 MCG CAPS Take 3,000 mcg by mouth daily.    [provider]  diclofenac  Sodium (VOLTAREN ) 1 % GEL Apply 2 g topically 4 (four) times daily. Patient taking differently: Apply 2 g topically 4 (four) times daily as needed (pain). 12/17/19   Darr, Jacob, PA-C  diphenhydrAMINE  HCl (BENADRYL  ALLERGY PO) Take 25 mg by mouth at bedtime.    [provider]  fentaNYL  (DURAGESIC ) 12 MCG/HR Place 1 patch onto the skin every 3 (three) days. Patient not taking: Reported on 04/08/2024 03/28/24   Vann, Jessica U, DO  fluticasone-salmeterol (ADVAIR  HFA) 115-21 MCG/ACT inhaler Inhale 2 puffs into the lungs 2 (two) times daily. 01/29/24   Wilfredo Hanly, MD  glucose blood (ACCU-CHEK GUIDE) test strip 1 each by Other route as needed. 06/05/23   [provider]  HYDROcodone  bit-homatropine (HYCODAN) 5-1.5 MG/5ML syrup Take 5 mLs by mouth every 4 (four) hours as needed for cough. Patient not taking: Reported on 04/08/2024 03/26/24   Vann, Jessica U, DO  ibuprofen  (ADVIL ) 400 MG tablet Take 400 mg by mouth every 6 (six) hours as needed for moderate pain (pain score 4-6).    [provider]  insulin  detemir (LEVEMIR  FLEXTOUCH) 100 UNIT/ML FlexPen Inject 5 Units into the skin 2 (two) times daily. 04/10/24   Oral Billings, MD  Insulin  lispro (HUMALOG JUNIOR KWIKPEN) 100 UNIT/ML Inject 1-7  Units into the skin as needed (high blood sugar).    [provider]  Ketotifen  Fumarate ([employer] ALLERGY EYE DROPS OP) Place 1 drop into both eyes 2 (two) times daily.    [provider]  levothyroxine  (SYNTHROID ) 200 MCG tablet Take 1 tablet (200 mcg total) by mouth daily at 6 (six) AM. 04/11/24 05/11/24  Oral Billings, MD  linaclotide  (LINZESS ) 145 MCG CAPS capsule Take 1 capsule (145 mcg total) by mouth daily before breakfast. 11/14/23   Daina Drum, MD  LORazepam  (ATIVAN ) 1 MG  tablet Take 1 tablet (1 mg total) by mouth every 4 (four) hours as needed for anxiety or sleep (or nausea). Patient not taking: Reported on 04/08/2024 03/26/24   Vann, Jessica U, DO  Melatonin 10-10 MG TBCR Take 10 mg by mouth at bedtime.    [provider]  methylphenidate  36 MG PO CR tablet Take 36 mg by mouth every morning.    [provider]  Morphine  Sulfate (MORPHINE  CONCENTRATE) 10 mg / 0.5 ml concentrated solution Place 0.5 mLs (10 mg total) under the tongue every 2 (two) hours as needed for moderate pain (pain score 4-6), severe pain (pain score 7-10), anxiety or shortness of breath (breakthrough pain). Patient not taking: Reported on 04/08/2024 03/26/24   Vann, Jessica U, DO  Multiple Vitamins-Minerals (MULTIVITAMINS THER. W/MINERALS) TABS Take 1 tablet by mouth daily.    [provider]  nystatin cream (MYCOSTATIN) Apply 1 Application topically 2 (two) times daily as needed for dry skin.    [provider]  omeprazole  (PRILOSEC) 20 MG capsule Take 1 capsule (20 mg total) by mouth 2 (two) times daily before a meal. 11/14/23   Daina Drum, MD  ondansetron  (ZOFRAN  ODT) 4 MG disintegrating tablet Take 1 tablet (4 mg total) by mouth every 6 (six) hours as needed for nausea or vomiting. 06/18/23   Esterwood, Amy S, PA-C  Polyethyl Glycol-Propyl Glycol (SYSTANE OP) Place 1 drop into both eyes 3 (three) times daily as needed (dry eyes).    [provider]  polyethylene glycol powder (GLYCOLAX /MIRALAX ) 17 GM/SCOOP powder Take 17 g by mouth 2 (two) times daily. 04/10/24   Oral Billings, MD  predniSONE  (DELTASONE ) 5 MG tablet Take 1 tablet (5 mg total) by mouth 2 (two) times daily. 12/25/23   Macdonald Savoy, MD      Allergies    Doxycycline, Aspirin , Nsaids, Amoxicillin -pot clavulanate, Gluten meal, Lactose, Codeine, Hydrocodone -acetaminophen , Oxycodone , Sulfa antibiotics, and Sulfasalazine    Review of Systems   Review of Systems  Gastrointestinal:   Positive for nausea and vomiting.  All other systems reviewed and are negative.   Physical Exam Updated Vital Signs BP 119/67   Pulse 62   Temp 98.7 F (37.1 C) (Oral)   Resp 18   Wt 55.3 kg   SpO2 97%   BMI 18.55 kg/m  Physical Exam Vitals and nursing note reviewed.  Constitutional:      Appearance: Normal appearance.  HENT:     Head: Normocephalic and atraumatic.     Right Ear: External ear normal.     Left Ear: External ear normal.     Nose: Nose normal.     Mouth/Throat:     Mouth: Mucous membranes are dry.  Eyes:     Extraocular Movements: Extraocular movements intact.     Conjunctiva/sclera: Conjunctivae normal.     Pupils: Pupils are equal, round, and reactive to light.  Cardiovascular:     Rate  and Rhythm: Normal rate and regular rhythm.     Pulses: Normal pulses.     Heart sounds: Normal heart sounds.  Pulmonary:     Effort: Pulmonary effort is normal.     Breath sounds: Normal breath sounds.  Abdominal:     General: Abdomen is flat. Bowel sounds are normal.     Palpations: Abdomen is soft.  Musculoskeletal:        General: Normal range of motion.     Cervical back: Normal range of motion and neck supple.  Skin:    General: Skin is warm.     Capillary Refill: Capillary refill takes less than 2 seconds.  Neurological:     General: No focal deficit present.     Mental Status: She is alert and oriented to person, place, and time.  Psychiatric:        Mood and Affect: Mood normal.        Behavior: Behavior normal.     ED Results / Procedures / Treatments   Labs (all labs ordered are listed, but only abnormal results are displayed) Labs Reviewed  CBC WITH DIFFERENTIAL/PLATELET - Abnormal; Notable for the following components:      Result Value   WBC 15.3 (*)    Hemoglobin 10.9 (*)    HCT 34.7 (*)    Platelets 492 (*)    Neutro Abs 13.1 (*)    Abs Immature Granulocytes 0.08 (*)    All other components within normal limits  COMPREHENSIVE METABOLIC  PANEL WITH GFR - Abnormal; Notable for the following components:   Sodium 130 (*)    Chloride 93 (*)    Glucose, Bld 122 (*)    Albumin 2.8 (*)    All other components within normal limits  URINALYSIS, ROUTINE W REFLEX MICROSCOPIC - Abnormal; Notable for the following components:   Color, Urine COLORLESS (*)    Specific Gravity, Urine 1.003 (*)    All other components within normal limits  BLOOD GAS, VENOUS - Abnormal; Notable for the following components:   pO2, Ven <31 (*)    Bicarbonate 31.9 (*)    Acid-Base Excess 6.6 (*)    All other components within normal limits  LIPASE, BLOOD  BETA-HYDROXYBUTYRIC ACID  MAGNESIUM   I-STAT CG4 LACTIC ACID, ED    EKG EKG Interpretation Date/Time:  Saturday Apr 11 2024 16:46:04 EDT Ventricular Rate:  60 PR Interval:  146 QRS Duration:  86 QT Interval:  395 QTC Calculation: 395 R Axis:   79  Text Interpretation: Age not entered, assumed to be  58 years old for purpose of ECG interpretation Sinus rhythm No significant change since last tracing Confirmed by Sueellen Emery [phone]) on 04/11/2024 5:11:09 PM  Radiology No results found.  Procedures Procedures    Medications Ordered in ED Medications  promethazine  (PHENERGAN ) 12.5 mg in sodium chloride  0.9 % 50 mL IVPB (has no administration in time range)  sodium chloride  0.9 % bolus 1,000 mL (0 mLs Intravenous Stopped 04/11/24 1721)  promethazine  (PHENERGAN ) 12.5 mg in sodium chloride  0.9 % 50 mL IVPB (0 mg Intravenous Stopped 04/11/24 1910)  alum & mag hydroxide-simeth (MAALOX/MYLANTA) 200-200-20 MG/5ML suspension 30 mL (30 mLs Oral Given 04/11/24 1850)    ED Course/ Medical Decision Making/ A&P                                 Medical Decision Making Amount and/or Complexity of Data Reviewed  Labs: ordered.  Risk OTC drugs. Prescription drug management.   This patient presents to the ED for concern of n/v, this involves an extensive number of treatment options, and is a  complaint that carries with it a high risk of complications and morbidity.  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  The pertinent results include:  cbc with wbc elevated at 15.3 (wbc has been elevated for weeks), hgb 10.9 (stable); cmp with na low at 130 (stable), bun/cr nl, lip nl, vbg with nl pH; bhb nl, mg nl, lactic nl, ua nl   Cardiac Monitoring:  The patient was maintained on a cardiac monitor.  I personally viewed and interpreted the cardiac monitored which showed an underlying rhythm of: nsr   Medicines ordered and prescription drug management:  I ordered medication including ivfs/phenergan /gi cocktail  for sx  Reevaluation of the patient after these medicines showed that the patient improved I have reviewed the patients home medicines and have made adjustments as needed   Test Considered:  ct   Critical Interventions:  ivfs   Problem List / ED Course:  N/v and dehydration:  pt feels much better after fluids and meds.  She is tolerating po fluids.  She is stable for d/c.  Return if worse.  F/u with pcp.   Reevaluation:  After the interventions noted above, I reevaluated the patient and found that they have :improved   Social Determinants of Health:  Lives at home   Dispostion:  After consideration of the diagnostic results and the patients response to treatment, I feel that the patent would benefit from discharge with  outpatient f/u.          Final Clinical Impression(s) / ED Diagnoses Final diagnoses:  Dehydration  Nausea and vomiting, unspecified vomiting type  Gastroesophageal reflux disease, unspecified whether esophagitis present    Rx / DC Orders ED Discharge Orders          Ordered    Dexlansoprazole (DEXILANT) 30 MG capsule DR  Daily        04/11/24 2007    promethazine  (PHENERGAN ) 25 MG tablet  Every 6 hours PRN        04/11/24 2007    promethazine  (PHENERGAN ) 25 MG suppository  Every 6 hours PRN        04/11/24 2007              Sueellen Emery, MD 04/11/24 2139

## 2024-04-11 NOTE — Discharge Instructions (Addendum)
 Stop the omeprazole  and start the dexlansoprazole.

## 2024-04-11 NOTE — ED Notes (Signed)
 Pt provided cath kit in order to perform self cath to obtain urine specimen. Pt ambulated to restroom independently with steady gait. Denies any pain or nausea at this time.

## 2024-04-11 NOTE — ED Notes (Signed)
 Pt informed NT that she catheterizes herself to urinate, and cannot urinate without doing so.

## 2024-04-20 ENCOUNTER — Emergency Department (HOSPITAL_BASED_OUTPATIENT_CLINIC_OR_DEPARTMENT_OTHER)
Admission: EM | Admit: 2024-04-20 | Discharge: 2024-04-21 | Disposition: A | Discharge status: Home / Self Care | Attending: Emergency Medicine | Admitting: Emergency Medicine

## 2024-04-20 ENCOUNTER — Other Ambulatory Visit: Payer: Self-pay

## 2024-04-20 DIAGNOSIS — E1142 Type 2 diabetes mellitus with diabetic polyneuropathy: Secondary | ICD-10-CM | POA: Insufficient documentation

## 2024-04-20 DIAGNOSIS — C799 Secondary malignant neoplasm of unspecified site: Secondary | ICD-10-CM | POA: Insufficient documentation

## 2024-04-20 DIAGNOSIS — Z79899 Other long term (current) drug therapy: Secondary | ICD-10-CM | POA: Diagnosis not present

## 2024-04-20 DIAGNOSIS — E860 Dehydration: Secondary | ICD-10-CM | POA: Diagnosis not present

## 2024-04-20 DIAGNOSIS — Z794 Long term (current) use of insulin: Secondary | ICD-10-CM | POA: Diagnosis not present

## 2024-04-20 DIAGNOSIS — Z85118 Personal history of other malignant neoplasm of bronchus and lung: Secondary | ICD-10-CM | POA: Insufficient documentation

## 2024-04-20 DIAGNOSIS — E039 Hypothyroidism, unspecified: Secondary | ICD-10-CM | POA: Insufficient documentation

## 2024-04-20 DIAGNOSIS — R112 Nausea with vomiting, unspecified: Secondary | ICD-10-CM | POA: Insufficient documentation

## 2024-04-20 LAB — COMPREHENSIVE METABOLIC PANEL WITH GFR
ALT: 24 U/L (ref 0–44)
AST: 45 U/L — ABNORMAL HIGH (ref 15–41)
Albumin: 3.6 g/dL (ref 3.5–5.0)
Alkaline Phosphatase: 157 U/L — ABNORMAL HIGH (ref 38–126)
Anion gap: 12 (ref 5–15)
BUN: 9 mg/dL (ref 6–20)
CO2: 26 mmol/L (ref 22–32)
Calcium: 9.6 mg/dL (ref 8.9–10.3)
Chloride: 93 mmol/L — ABNORMAL LOW (ref 98–111)
Creatinine, Ser: 0.7 mg/dL (ref 0.44–1.00)
GFR, Estimated: >60 mL/min (ref >60)
Glucose, Bld: 189 mg/dL — ABNORMAL HIGH (ref 70–99)
Potassium: 4.4 mmol/L (ref 3.5–5.1)
Sodium: 131 mmol/L — ABNORMAL LOW (ref 135–145)
Total Bilirubin: 0.3 mg/dL (ref 0.0–1.2)
Total Protein: 7.6 g/dL (ref 6.5–8.1)

## 2024-04-20 LAB — URINALYSIS, ROUTINE W REFLEX MICROSCOPIC
Bilirubin Urine: NEGATIVE
Glucose, UA: NEGATIVE mg/dL
Hgb urine dipstick: NEGATIVE
Ketones, ur: NEGATIVE mg/dL
Leukocytes,Ua: NEGATIVE
Nitrite: NEGATIVE
Protein, ur: NEGATIVE mg/dL
Specific Gravity, Urine: 1.015 (ref 1.005–1.030)
pH: 7.5 (ref 5.0–8.0)

## 2024-04-20 LAB — LIPASE, BLOOD: Lipase: 12 U/L (ref 11–51)

## 2024-04-20 LAB — CBC
HCT: 34.5 % — ABNORMAL LOW (ref 36.0–46.0)
Hemoglobin: 10.9 g/dL — ABNORMAL LOW (ref 12.0–15.0)
MCH: 26.8 pg (ref 26.0–34.0)
MCHC: 31.6 g/dL (ref 30.0–36.0)
MCV: 85 fL (ref 80.0–100.0)
Platelets: 235 10*3/uL (ref 150–400)
RBC: 4.06 MIL/uL (ref 3.87–5.11)
RDW: 15.9 % — ABNORMAL HIGH (ref 11.5–15.5)
WBC: 15.6 10*3/uL — ABNORMAL HIGH (ref 4.0–10.5)
nRBC: 0 % (ref 0.0–0.2)

## 2024-04-20 MED ORDER — LACTATED RINGERS IV BOLUS
1000.0000 mL | Freq: Once | INTRAVENOUS | Status: AC
  Administered 2024-04-20: 1000 mL via INTRAVENOUS

## 2024-04-20 MED ORDER — ONDANSETRON HCL 4 MG/2ML IJ SOLN
4.0000 mg | Freq: Once | INTRAMUSCULAR | Status: AC | PRN
  Administered 2024-04-20: 4 mg via INTRAVENOUS
  Filled 2024-04-20: qty 2

## 2024-04-20 MED ORDER — DIPHENHYDRAMINE HCL 50 MG/ML IJ SOLN
25.0000 mg | Freq: Once | INTRAMUSCULAR | Status: AC
  Administered 2024-04-20: 25 mg via INTRAVENOUS
  Filled 2024-04-20: qty 1

## 2024-04-20 MED ORDER — PROMETHAZINE HCL 25 MG RE SUPP: 25.0000 mg | Freq: Four times a day (QID) | RECTAL | 2 refills | Status: DC | PRN

## 2024-04-20 MED ORDER — MORPHINE SULFATE (PF) 4 MG/ML IV SOLN
4.0000 mg | Freq: Once | INTRAVENOUS | Status: AC
  Administered 2024-04-20: 4 mg via INTRAVENOUS
  Filled 2024-04-20: qty 1

## 2024-04-20 NOTE — ED Triage Notes (Signed)
 Pt POV reporting persistent n/v and dehydration, +lung cancer, unable to keep meds down at this time.

## 2024-04-20 NOTE — ED Provider Notes (Signed)
 [place] EMERGENCY DEPARTMENT AT [facility] Provider Note   CSN: [id number] Arrival date & time: 04/20/24  2124     History {Add pertinent medical, surgical, social history, OB history to HPI:1} Chief Complaint  Patient presents with   Emesis    Mary Erickson is a 58 y.o. female.   Emesis Patient presents with nausea vomiting and diarrhea.  Feels as if she is dehydrated.  History of lung cancer that is not amenable to treatment at this time.  States she is going to be seen by palliative care at home with the thought of likely going to hospice.  States she does not have the Phenergan  suppositories that seem to help.  Has been on what appears to be 5 mg of oral liquid morphine .  States that has not been working as much for her pain.  Does self cath.  Think she should be able to go home with hydration.    Past Medical History:  Diagnosis Date   Acid reflux    ADHD (attention deficit hyperactivity disorder)    Anemia    Arthritis    "elbows, hands, neck, shoulders" (10/02/2016)   Calcium  blood increased    Chronic neck pain    Chronic UTI (urinary tract infection)    "from self caths" (10/02/2016)   Hashimoto's disease    Hepatitis B    acute hepatitis B from lancet   History of alcoholism (HCC)    10/02/2016 "sober since 03/14/2005"   History of blood transfusion 1984; 1989; 2008   "quite a few"   History of shortness of breath    Hypercholesteremia    Hypothyroidism    no meds   Internal carotid aneurysm dx'd early 2017   Legally blind in left eye, as defined in [place]     Melanoma (HCC)    left upper arm   Migraines    "quite a few in a month; at least 15" (10/02/2016)   Nausea and vomiting 04/08/2024   Neuropathy    neck; "not sure if it's from DM or Sjogren's" (10/02/2016)   Pneumonia 1986   "1 yr after major OR when I was under anesthesia for 13 hr"   Seizures (HCC) ~ 2004; ~ 2013   "seizures from diabetes "   Self-catheterizes urinary bladder     "since OR in 1984" (10/02/2016)   Sjogren's syndrome (HCC)    Thyroid  goiter    "still present" (10/02/2016)   Type II diabetes mellitus (HCC)    Urinary retention    self caths   Wears glasses     Home Medications Prior to Admission medications   Medication Sig Start Date End Date Taking? Authorizing Provider  acetaminophen  (TYLENOL ) 500 MG tablet Take 1,000 mg by mouth every 6 (six) hours as needed for moderate pain.    [provider]  benzocaine  (ORAJEL) 10 % mucosal gel Use as directed 1 Application in the mouth or throat as needed for mouth pain.    [provider]  Cholecalciferol  (VITAMIN D3) 125 MCG (5000 UT) capsule Take 5,000 Units by mouth daily.    [provider]  Continuous Glucose Sensor (DEXCOM G7 SENSOR) MISC Inject 1 applicator into the skin See admin instructions. Every 10 days 11/27/23   [provider]  Cyanocobalamin  (B-12) 3000 MCG CAPS Take 3,000 mcg by mouth daily.    [provider]  Dexlansoprazole  (DEXILANT ) 30 MG capsule DR Take 1 capsule (30 mg total) by mouth daily. 04/11/24   Haviland,  Concha Deed, MD  diclofenac  Sodium (VOLTAREN ) 1 % GEL Apply 2 g topically 4 (four) times daily. Patient taking differently: Apply 2 g topically 4 (four) times daily as needed (pain). 12/17/19   Darr, Jacob, PA-C  diphenhydrAMINE  HCl (BENADRYL  ALLERGY PO) Take 25 mg by mouth at bedtime.    [provider]  fentaNYL  (DURAGESIC ) 12 MCG/HR Place 1 patch onto the skin every 3 (three) days. Patient not taking: Reported on 04/08/2024 03/28/24   Vann, Jessica U, DO  fluticasone-salmeterol (ADVAIR  HFA) 115-21 MCG/ACT inhaler Inhale 2 puffs into the lungs 2 (two) times daily. 01/29/24   Wilfredo Hanly, MD  glucose blood (ACCU-CHEK GUIDE) test strip 1 each by Other route as needed. 06/05/23   [provider]  HYDROcodone  bit-homatropine (HYCODAN) 5-1.5 MG/5ML syrup Take 5 mLs by mouth every 4 (four) hours as needed for  cough. Patient not taking: Reported on 04/08/2024 03/26/24   Vann, Jessica U, DO  ibuprofen  (ADVIL ) 400 MG tablet Take 400 mg by mouth every 6 (six) hours as needed for moderate pain (pain score 4-6).    [provider]  insulin  detemir (LEVEMIR  FLEXTOUCH) 100 UNIT/ML FlexPen Inject 5 Units into the skin 2 (two) times daily. 04/10/24   Oral Billings, MD  Insulin  lispro (HUMALOG JUNIOR [facility]) 100 UNIT/ML Inject 1-7 Units into the skin as needed (high blood sugar).    [provider]  Ketotifen  Fumarate ([employer] ALLERGY EYE DROPS OP) Place 1 drop into both eyes 2 (two) times daily.    [provider]  levothyroxine  (SYNTHROID ) 200 MCG tablet Take 1 tablet (200 mcg total) by mouth daily at 6 (six) AM. 04/11/24 05/11/24  Oral Billings, MD  linaclotide  (LINZESS ) 145 MCG CAPS capsule Take 1 capsule (145 mcg total) by mouth daily before breakfast. 11/14/23   Daina Drum, MD  LORazepam  (ATIVAN ) 1 MG tablet Take 1 tablet (1 mg total) by mouth every 4 (four) hours as needed for anxiety or sleep (or nausea). Patient not taking: Reported on 04/08/2024 03/26/24   Vann, Jessica U, DO  Melatonin 10-10 MG TBCR Take 10 mg by mouth at bedtime.    [provider]  methylphenidate  36 MG PO CR tablet Take 36 mg by mouth every morning.    [provider]  Morphine  Sulfate (MORPHINE  CONCENTRATE) 10 mg / 0.5 ml concentrated solution Place 0.5 mLs (10 mg total) under the tongue every 2 (two) hours as needed for moderate pain (pain score 4-6), severe pain (pain score 7-10), anxiety or shortness of breath (breakthrough pain). Patient not taking: Reported on 04/08/2024 03/26/24   Vann, Jessica U, DO  Multiple Vitamins-Minerals (MULTIVITAMINS THER. W/MINERALS) TABS Take 1 tablet by mouth daily.    [provider]  nystatin cream (MYCOSTATIN) Apply 1 Application topically 2 (two) times daily as needed for dry skin.    [provider]  omeprazole  (PRILOSEC) 20 MG capsule  Take 1 capsule (20 mg total) by mouth 2 (two) times daily before a meal. 11/14/23   Daina Drum, MD  ondansetron  (ZOFRAN  ODT) 4 MG disintegrating tablet Take 1 tablet (4 mg total) by mouth every 6 (six) hours as needed for nausea or vomiting. 06/18/23   Esterwood, Amy S, PA-C  Polyethyl Glycol-Propyl Glycol (SYSTANE OP) Place 1 drop into both eyes 3 (three) times daily as needed (dry eyes).    [provider]  polyethylene glycol powder (GLYCOLAX /MIRALAX ) 17 GM/SCOOP powder Take 17 g by mouth 2 (two) times daily. 04/10/24  Oral Billings, MD  predniSONE  (DELTASONE ) 5 MG tablet Take 1 tablet (5 mg total) by mouth 2 (two) times daily. 12/25/23   Macdonald Savoy, MD  promethazine  (PHENERGAN ) 25 MG suppository Place 1 suppository (25 mg total) rectally every 6 (six) hours as needed for nausea or vomiting. 04/20/24   Mozell Arias, MD  promethazine  (PHENERGAN ) 25 MG tablet Take 1 tablet (25 mg total) by mouth every 6 (six) hours as needed for nausea or vomiting. 04/11/24   Sueellen Emery, MD      Allergies    Doxycycline, Aspirin , Nsaids, Amoxicillin -pot clavulanate, Gluten meal, Lactose, Codeine, Hydrocodone -acetaminophen , Oxycodone , Sulfa antibiotics, and Sulfasalazine    Review of Systems   Review of Systems  Gastrointestinal:  Positive for vomiting.    Physical Exam Updated Vital Signs BP 120/69   Pulse 70   Temp 99.1 F (37.3 C)   Resp 20   Ht 5\' 8"  (1.727 m)   Wt 55.3 kg   SpO2 96%   BMI 18.55 kg/m  Physical Exam Vitals and nursing note reviewed.  HENT:     Head: Normocephalic.  Cardiovascular:     Rate and Rhythm: Normal rate.  Abdominal:     Tenderness: There is no abdominal tenderness.  Skin:    Capillary Refill: Capillary refill takes less than 2 seconds.  Neurological:     Mental Status: She is alert and oriented to person, place, and time. Mental status is at baseline.     ED Results / Procedures / Treatments   Labs (all labs ordered are listed, but  only abnormal results are displayed) Labs Reviewed  COMPREHENSIVE METABOLIC PANEL WITH GFR - Abnormal; Notable for the following components:      Result Value   Sodium 131 (*)    Chloride 93 (*)    Glucose, Bld 189 (*)    AST 45 (*)    Alkaline Phosphatase 157 (*)    All other components within normal limits  CBC - Abnormal; Notable for the following components:   WBC 15.6 (*)    Hemoglobin 10.9 (*)    HCT 34.5 (*)    RDW 15.9 (*)    All other components within normal limits  URINALYSIS, ROUTINE W REFLEX MICROSCOPIC - Abnormal; Notable for the following components:   Color, Urine STRAW (*)    All other components within normal limits  LIPASE, BLOOD    EKG None  Radiology No results found.  Procedures Procedures  {Document cardiac monitor, telemetry assessment procedure when appropriate:1}  Medications Ordered in ED Medications  lactated ringers  bolus 1,000 mL (has no administration in time range)  ondansetron  (ZOFRAN ) injection 4 mg (4 mg Intravenous Given 04/20/24 2139)  lactated ringers  bolus 1,000 mL (1,000 mLs Intravenous New Bag/Given 04/20/24 2200)  morphine  (PF) 4 MG/ML injection 4 mg (4 mg Intravenous Given 04/20/24 2201)  diphenhydrAMINE  (BENADRYL ) injection 25 mg (25 mg Intravenous Given 04/20/24 2230)    ED Course/ Medical Decision Making/ A&P   {   Click here for ABCD2, HEART and other calculatorsREFRESH Note before signing :1}                              Medical Decision Making Amount and/or Complexity of Data Reviewed Labs: ordered.  Risk Prescription drug management.   Patient with nausea vomiting and some diarrhea.  History of metastatic cancer that is not amenable to treatment due to some of her autoimmune diseases.  Plan on entering hospice at home.  Discussed with patient and feels if she is dehydrated.  I had agreed that she would benefit from IV fluids.  Will also give some pain medicines and antiemetics.  Will try the Zofran  but states she does not  think she needs other antiemetics Right now.  Blood work overall reassuring.  Has a leukocytosis which appears chronic.  Mild hyponatremia also appears chronic.  Discussed with patient.  Feeling somewhat better after first bag of fluid but with continued symptoms that she has had recently will give second bag.  Should be able to discharge home.    {Document critical care time when appropriate:1} {Document review of labs and clinical decision tools ie heart score, Chads2Vasc2 etc:1}  {Document your independent review of radiology images, and any outside records:1} {Document your discussion with family members, caretakers, and with consultants:1} {Document social determinants of health affecting pt's care:1} {Document your decision making why or why not admission, treatments were needed:1} Final Clinical Impression(s) / ED Diagnoses Final diagnoses:  Dehydration  Metastatic malignant neoplasm, unspecified site [facility])    Rx / DC Orders ED Discharge Orders          Ordered    promethazine  (PHENERGAN ) 25 MG suppository  Every 6 hours PRN        04/20/24 2256

## 2024-04-20 NOTE — Discharge Instructions (Addendum)
Follow up with hospice as planned.

## 2024-04-21 NOTE — ED Notes (Signed)
 Pt expresses that she is feeling much better.  Nausea resolved.  D/c paperwork given and reviewed.  Pt left ambulatory

## 2024-04-24 ENCOUNTER — Other Ambulatory Visit: Payer: Self-pay

## 2024-04-24 ENCOUNTER — Encounter (HOSPITAL_BASED_OUTPATIENT_CLINIC_OR_DEPARTMENT_OTHER): Payer: Self-pay

## 2024-04-24 ENCOUNTER — Inpatient Hospital Stay (HOSPITAL_BASED_OUTPATIENT_CLINIC_OR_DEPARTMENT_OTHER)
Admission: EM | Admit: 2024-04-24 | Discharge: 2024-04-28 | DRG: 948 | Disposition: A | Discharge status: Hospice | Attending: Internal Medicine | Admitting: Internal Medicine

## 2024-04-24 ENCOUNTER — Emergency Department (HOSPITAL_BASED_OUTPATIENT_CLINIC_OR_DEPARTMENT_OTHER)

## 2024-04-24 DIAGNOSIS — K581 Irritable bowel syndrome with constipation: Secondary | ICD-10-CM | POA: Diagnosis present

## 2024-04-24 DIAGNOSIS — R52 Pain, unspecified: Secondary | ICD-10-CM | POA: Diagnosis not present

## 2024-04-24 DIAGNOSIS — E7800 Pure hypercholesterolemia, unspecified: Secondary | ICD-10-CM | POA: Diagnosis present

## 2024-04-24 DIAGNOSIS — M3500 Sicca syndrome, unspecified: Secondary | ICD-10-CM | POA: Diagnosis present

## 2024-04-24 DIAGNOSIS — R636 Underweight: Secondary | ICD-10-CM | POA: Diagnosis present

## 2024-04-24 DIAGNOSIS — Z820 Family history of epilepsy and other diseases of the nervous system: Secondary | ICD-10-CM

## 2024-04-24 DIAGNOSIS — M069 Rheumatoid arthritis, unspecified: Secondary | ICD-10-CM | POA: Diagnosis present

## 2024-04-24 DIAGNOSIS — Z8701 Personal history of pneumonia (recurrent): Secondary | ICD-10-CM

## 2024-04-24 DIAGNOSIS — R569 Unspecified convulsions: Secondary | ICD-10-CM | POA: Diagnosis present

## 2024-04-24 DIAGNOSIS — E890 Postprocedural hypothyroidism: Secondary | ICD-10-CM | POA: Diagnosis present

## 2024-04-24 DIAGNOSIS — F909 Attention-deficit hyperactivity disorder, unspecified type: Secondary | ICD-10-CM | POA: Diagnosis present

## 2024-04-24 DIAGNOSIS — Z87891 Personal history of nicotine dependence: Secondary | ICD-10-CM

## 2024-04-24 DIAGNOSIS — C3411 Malignant neoplasm of upper lobe, right bronchus or lung: Secondary | ICD-10-CM | POA: Diagnosis present

## 2024-04-24 DIAGNOSIS — Z515 Encounter for palliative care: Secondary | ICD-10-CM

## 2024-04-24 DIAGNOSIS — Z79890 Hormone replacement therapy (postmenopausal): Secondary | ICD-10-CM

## 2024-04-24 DIAGNOSIS — Z66 Do not resuscitate: Secondary | ICD-10-CM | POA: Diagnosis present

## 2024-04-24 DIAGNOSIS — Z90710 Acquired absence of both cervix and uterus: Secondary | ICD-10-CM

## 2024-04-24 DIAGNOSIS — D72829 Elevated white blood cell count, unspecified: Secondary | ICD-10-CM | POA: Diagnosis present

## 2024-04-24 DIAGNOSIS — Z681 Body mass index (BMI) 19 or less, adult: Secondary | ICD-10-CM

## 2024-04-24 DIAGNOSIS — Z85820 Personal history of malignant melanoma of skin: Secondary | ICD-10-CM

## 2024-04-24 DIAGNOSIS — G43909 Migraine, unspecified, not intractable, without status migrainosus: Secondary | ICD-10-CM | POA: Diagnosis present

## 2024-04-24 DIAGNOSIS — Z794 Long term (current) use of insulin: Secondary | ICD-10-CM

## 2024-04-24 DIAGNOSIS — Z9049 Acquired absence of other specified parts of digestive tract: Secondary | ICD-10-CM

## 2024-04-24 DIAGNOSIS — G893 Neoplasm related pain (acute) (chronic): Principal | ICD-10-CM | POA: Diagnosis present

## 2024-04-24 DIAGNOSIS — J432 Centrilobular emphysema: Secondary | ICD-10-CM

## 2024-04-24 DIAGNOSIS — N319 Neuromuscular dysfunction of bladder, unspecified: Secondary | ICD-10-CM | POA: Diagnosis present

## 2024-04-24 DIAGNOSIS — E1065 Type 1 diabetes mellitus with hyperglycemia: Secondary | ICD-10-CM | POA: Diagnosis present

## 2024-04-24 DIAGNOSIS — H548 Legal blindness, as defined in USA: Secondary | ICD-10-CM | POA: Diagnosis present

## 2024-04-24 LAB — CBC WITH DIFFERENTIAL/PLATELET
Abs Immature Granulocytes: 0.08 10*3/uL — ABNORMAL HIGH (ref 0.00–0.07)
Basophils Absolute: 0 10*3/uL (ref 0.0–0.1)
Basophils Relative: 0 %
Eosinophils Absolute: 0 10*3/uL (ref 0.0–0.5)
Eosinophils Relative: 0 %
HCT: 30.6 % — ABNORMAL LOW (ref 36.0–46.0)
Hemoglobin: 9.9 g/dL — ABNORMAL LOW (ref 12.0–15.0)
Immature Granulocytes: 0 %
Lymphocytes Relative: 5 %
Lymphs Abs: 0.8 10*3/uL (ref 0.7–4.0)
MCH: 27.4 pg (ref 26.0–34.0)
MCHC: 32.4 g/dL (ref 30.0–36.0)
MCV: 84.8 fL (ref 80.0–100.0)
Monocytes Absolute: 0.4 10*3/uL (ref 0.1–1.0)
Monocytes Relative: 2 %
Neutro Abs: 16.5 10*3/uL — ABNORMAL HIGH (ref 1.7–7.7)
Neutrophils Relative %: 93 %
Platelets: 400 10*3/uL (ref 150–400)
RBC: 3.61 MIL/uL — ABNORMAL LOW (ref 3.87–5.11)
RDW: 15.9 % — ABNORMAL HIGH (ref 11.5–15.5)
WBC: 17.8 10*3/uL — ABNORMAL HIGH (ref 4.0–10.5)
nRBC: 0 % (ref 0.0–0.2)

## 2024-04-24 LAB — COMPREHENSIVE METABOLIC PANEL WITH GFR
ALT: 22 U/L (ref 0–44)
AST: 38 U/L (ref 15–41)
Albumin: 3.7 g/dL (ref 3.5–5.0)
Alkaline Phosphatase: 150 U/L — ABNORMAL HIGH (ref 38–126)
Anion gap: 13 (ref 5–15)
BUN: 16 mg/dL (ref 6–20)
CO2: 25 mmol/L (ref 22–32)
Calcium: 9.5 mg/dL (ref 8.9–10.3)
Chloride: 93 mmol/L — ABNORMAL LOW (ref 98–111)
Creatinine, Ser: 0.91 mg/dL (ref 0.44–1.00)
GFR, Estimated: >60 mL/min (ref >60)
Glucose, Bld: 181 mg/dL — ABNORMAL HIGH (ref 70–99)
Potassium: 4.4 mmol/L (ref 3.5–5.1)
Sodium: 131 mmol/L — ABNORMAL LOW (ref 135–145)
Total Bilirubin: 0.3 mg/dL (ref 0.0–1.2)
Total Protein: 7.8 g/dL (ref 6.5–8.1)

## 2024-04-24 LAB — LIPASE, BLOOD: Lipase: 12 U/L (ref 11–51)

## 2024-04-24 LAB — TROPONIN T, HIGH SENSITIVITY: Troponin T High Sensitivity: <15 ng/L (ref <19)

## 2024-04-24 MED ORDER — ONDANSETRON HCL 4 MG/2ML IJ SOLN
4.0000 mg | Freq: Four times a day (QID) | INTRAMUSCULAR | Status: DC | PRN
  Administered 2024-04-24 – 2024-04-26 (×5): 4 mg via INTRAVENOUS
  Filled 2024-04-24 (×7): qty 2

## 2024-04-24 MED ORDER — ALUM & MAG HYDROXIDE-SIMETH 200-200-20 MG/5ML PO SUSP
30.0000 mL | ORAL | Status: DC | PRN
  Administered 2024-04-25 – 2024-04-26 (×5): 30 mL via ORAL
  Filled 2024-04-24 (×5): qty 30

## 2024-04-24 MED ORDER — HEPARIN SODIUM (PORCINE) 5000 UNIT/ML IJ SOLN
5000.0000 [IU] | Freq: Three times a day (TID) | INTRAMUSCULAR | Status: DC
  Administered 2024-04-25 – 2024-04-28 (×7): 5000 [IU] via SUBCUTANEOUS
  Filled 2024-04-24 (×10): qty 1

## 2024-04-24 MED ORDER — ALBUTEROL SULFATE (2.5 MG/3ML) 0.083% IN NEBU: 2.5000 mg | INHALATION_SOLUTION | RESPIRATORY_TRACT | Status: DC | PRN

## 2024-04-24 MED ORDER — ONDANSETRON HCL 4 MG/2ML IJ SOLN
4.0000 mg | Freq: Once | INTRAMUSCULAR | Status: AC
  Administered 2024-04-24: 4 mg via INTRAVENOUS
  Filled 2024-04-24: qty 2

## 2024-04-24 MED ORDER — ONDANSETRON HCL 4 MG PO TABS
4.0000 mg | ORAL_TABLET | Freq: Four times a day (QID) | ORAL | Status: DC | PRN
  Administered 2024-04-28: 4 mg via ORAL
  Filled 2024-04-24 (×3): qty 1

## 2024-04-24 MED ORDER — IPRATROPIUM-ALBUTEROL 0.5-2.5 (3) MG/3ML IN SOLN
3.0000 mL | Freq: Once | RESPIRATORY_TRACT | Status: AC
  Administered 2024-04-24: 3 mL via RESPIRATORY_TRACT
  Filled 2024-04-24: qty 3

## 2024-04-24 MED ORDER — HYDROMORPHONE HCL 1 MG/ML IJ SOLN
0.5000 mg | INTRAMUSCULAR | Status: DC | PRN
  Administered 2024-04-24 – 2024-04-26 (×8): 1 mg via INTRAVENOUS
  Filled 2024-04-24 (×8): qty 1

## 2024-04-24 MED ORDER — HYDROMORPHONE HCL 1 MG/ML IJ SOLN
0.5000 mg | Freq: Once | INTRAMUSCULAR | Status: AC
  Administered 2024-04-24: 0.5 mg via INTRAVENOUS
  Filled 2024-04-24: qty 1

## 2024-04-24 MED ORDER — HYDROMORPHONE HCL 1 MG/ML IJ SOLN
1.0000 mg | Freq: Once | INTRAMUSCULAR | Status: AC
  Administered 2024-04-24: 1 mg via INTRAVENOUS
  Filled 2024-04-24: qty 1

## 2024-04-24 MED ORDER — METHYLPREDNISOLONE SODIUM SUCC 125 MG IJ SOLR
125.0000 mg | Freq: Once | INTRAMUSCULAR | Status: AC
Start: 2024-04-24 — End: 2024-04-24
  Administered 2024-04-24: 125 mg via INTRAVENOUS
  Filled 2024-04-24: qty 2

## 2024-04-24 NOTE — Progress Notes (Signed)
 Plan of Care Note for accepted transfer   Patient: Mary Erickson MRN: [id number]   DOA: 04/24/2024  Facility requesting transfer: [facility]   Requesting Provider: Tama Fails, PA   Reason for transfer: Uncontrolled pain   Facility course: 58 yr old female with lung cancer, RA, hypothyroidism, type 1 DM, neurogenic bladder, and chronic cancer-related pain who presents with increased pain that she has been unable to control at home.   She is not pursuing any treatment for the cancer and has a hospice referral in place.   She was given 2 doses of IV Dilaudid  in ED but still in pain.   Plan of care: The patient is accepted for admission to Med-surg  unit, at [facility].   Author: Walton Guppy, MD 04/24/2024  Check [email or web address] for on-call coverage.  Nursing staff, Please call [facility] Admits & Consults System-Wide number on Amion as soon as patient's arrival, so appropriate admitting provider can evaluate the pt.

## 2024-04-24 NOTE — ED Provider Notes (Signed)
 [place] EMERGENCY DEPARTMENT AT [facility] Provider Note   CSN: [id number] Arrival date & time: 04/24/24  1759     Patient presents with:Intractable pain  Mary Erickson is a 58 y.o. female with terminal stage III cancer non-small cell lung carcinoma, history of rheumatoid arthritis.  Patient reports severe increased pain.  She is currently under palliative care but has had a consult with a [facility] and states that she is not really sure who is in charge of her pain medications but she is taking morphine  without relief.  She states that she has only had about 2 hours of sleep over the past 2 nights due to severe burning pain in her chest.  She states that at first the medication she was using for pain control of her terminal cancer were working but now are no longer and she cannot seem to get any help with treatment at home.  Patient states that she was told that the doctor might be able to come see you but he has been very busy.  Patient states that she has been unable to sleep and is tearful stating I am not trying to be a bother but I am at wits end. I am just trying to have a few good days before I die.  Also reports increased wheezing over the past 24 hours    Shoulder Pain      Prior to Admission medications   Medication Sig Start Date End Date Taking? Authorizing Provider  acetaminophen  (TYLENOL ) 500 MG tablet Take 1,000 mg by mouth every 6 (six) hours as needed for moderate pain.    [provider]  benzocaine  (ORAJEL) 10 % mucosal gel Use as directed 1 Application in the mouth or throat as needed for mouth pain.    [provider]  Cholecalciferol  (VITAMIN D3) 125 MCG (5000 UT) capsule Take 5,000 Units by mouth daily.    [provider]  Continuous Glucose Sensor (DEXCOM G7 SENSOR) MISC Inject 1 applicator into the skin See admin instructions. Every 10 days 11/27/23   [provider]  Cyanocobalamin  (B-12) 3000 MCG CAPS Take  3,000 mcg by mouth daily.    [provider]  Dexlansoprazole  (DEXILANT ) 30 MG capsule DR Take 1 capsule (30 mg total) by mouth daily. 04/11/24   Sueellen Emery, MD  diclofenac  Sodium (VOLTAREN ) 1 % GEL Apply 2 g topically 4 (four) times daily. Patient taking differently: Apply 2 g topically 4 (four) times daily as needed (pain). 12/17/19   Darr, Jacob, PA-C  diphenhydrAMINE  HCl (BENADRYL  ALLERGY PO) Take 25 mg by mouth at bedtime.    [provider]  fentaNYL  (DURAGESIC ) 12 MCG/HR Place 1 patch onto the skin every 3 (three) days. Patient not taking: Reported on 04/08/2024 03/28/24   Vann, Jessica U, DO  fluticasone-salmeterol (ADVAIR  HFA) 115-21 MCG/ACT inhaler Inhale 2 puffs into the lungs 2 (two) times daily. 01/29/24   Wilfredo Hanly, MD  glucose blood (ACCU-CHEK GUIDE) test strip 1 each by Other route as needed. 06/05/23   [provider]  HYDROcodone  bit-homatropine (HYCODAN) 5-1.5 MG/5ML syrup Take 5 mLs by mouth every 4 (four) hours as needed for cough. Patient not taking: Reported on 04/08/2024 03/26/24   Vann, Jessica U, DO  ibuprofen  (ADVIL ) 400 MG tablet Take 400 mg by mouth every 6 (six) hours as needed for moderate pain (pain score 4-6).    [provider]  insulin  detemir (LEVEMIR  FLEXTOUCH) 100 UNIT/ML FlexPen Inject 5 Units into the  skin 2 (two) times daily. 04/10/24   Oral Billings, MD  Insulin  lispro (HUMALOG JUNIOR [facility]) 100 UNIT/ML Inject 1-7 Units into the skin as needed (high blood sugar).    [provider]  Ketotifen  Fumarate ([employer] ALLERGY EYE DROPS OP) Place 1 drop into both eyes 2 (two) times daily.    [provider]  levothyroxine  (SYNTHROID ) 200 MCG tablet Take 1 tablet (200 mcg total) by mouth daily at 6 (six) AM. 04/11/24 05/11/24  Oral Billings, MD  linaclotide  (LINZESS ) 145 MCG CAPS capsule Take 1 capsule (145 mcg total) by mouth daily before breakfast. 11/14/23   Daina Drum, MD  LORazepam  (ATIVAN ) 1 MG tablet  Take 1 tablet (1 mg total) by mouth every 4 (four) hours as needed for anxiety or sleep (or nausea). Patient not taking: Reported on 04/08/2024 03/26/24   Vann, Jessica U, DO  Melatonin 10-10 MG TBCR Take 10 mg by mouth at bedtime.    [provider]  methylphenidate  36 MG PO CR tablet Take 36 mg by mouth every morning.    [provider]  Morphine  Sulfate (MORPHINE  CONCENTRATE) 10 mg / 0.5 ml concentrated solution Place 0.5 mLs (10 mg total) under the tongue every 2 (two) hours as needed for moderate pain (pain score 4-6), severe pain (pain score 7-10), anxiety or shortness of breath (breakthrough pain). Patient not taking: Reported on 04/08/2024 03/26/24   Vann, Jessica U, DO  Multiple Vitamins-Minerals (MULTIVITAMINS THER. W/MINERALS) TABS Take 1 tablet by mouth daily.    [provider]  nystatin cream (MYCOSTATIN) Apply 1 Application topically 2 (two) times daily as needed for dry skin.    [provider]  omeprazole  (PRILOSEC) 20 MG capsule Take 1 capsule (20 mg total) by mouth 2 (two) times daily before a meal. 11/14/23   Daina Drum, MD  ondansetron  (ZOFRAN  ODT) 4 MG disintegrating tablet Take 1 tablet (4 mg total) by mouth every 6 (six) hours as needed for nausea or vomiting. 06/18/23   Esterwood, Amy S, PA-C  Polyethyl Glycol-Propyl Glycol (SYSTANE OP) Place 1 drop into both eyes 3 (three) times daily as needed (dry eyes).    [provider]  polyethylene glycol powder (GLYCOLAX /MIRALAX ) 17 GM/SCOOP powder Take 17 g by mouth 2 (two) times daily. 04/10/24   Oral Billings, MD  predniSONE  (DELTASONE ) 5 MG tablet Take 1 tablet (5 mg total) by mouth 2 (two) times daily. 12/25/23   Macdonald Savoy, MD  promethazine  (PHENERGAN ) 25 MG suppository Place 1 suppository (25 mg total) rectally every 6 (six) hours as needed for nausea or vomiting. 04/20/24   Mozell Arias, MD  promethazine  (PHENERGAN ) 25 MG tablet Take 1 tablet (25 mg total) by mouth every 6  (six) hours as needed for nausea or vomiting. 04/11/24   Sueellen Emery, MD    Allergies: Doxycycline, Aspirin , Nsaids, Amoxicillin -pot clavulanate, Gluten meal, Lactose, Codeine, Hydrocodone -acetaminophen , Oxycodone , Sulfa antibiotics, and Sulfasalazine    Review of Systems  Updated Vital Signs BP 138/66   Pulse 81   Temp 98.4 F (36.9 C)   Resp 16   Ht 5' 8 (1.727 m)   Wt 55.3 kg   SpO2 96%   BMI 18.55 kg/m   Physical Exam Vitals and nursing note reviewed.  Constitutional:      General: She is not in acute distress.    Appearance: She is well-developed and underweight. She is not diaphoretic.  HENT:     Head: Normocephalic and atraumatic.  Right Ear: External ear normal.     Left Ear: External ear normal.     Nose: Nose normal.     Mouth/Throat:     Mouth: Mucous membranes are moist.   Eyes:     General: No scleral icterus.    Conjunctiva/sclera: Conjunctivae normal.    Cardiovascular:     Rate and Rhythm: Normal rate and regular rhythm.     Heart sounds: Normal heart sounds. No murmur heard.    No friction rub. No gallop.  Pulmonary:     Effort: Pulmonary effort is normal. No respiratory distress.     Breath sounds: Wheezing present.  Abdominal:     General: Bowel sounds are normal. There is no distension.     Palpations: Abdomen is soft. There is no mass.     Tenderness: There is no abdominal tenderness. There is no guarding.   Musculoskeletal:     Cervical back: Normal range of motion.   Skin:    General: Skin is warm and dry.   Neurological:     Mental Status: She is alert and oriented to person, place, and time.   Psychiatric:        Behavior: Behavior normal.     (all labs ordered are listed, but only abnormal results are displayed) Labs Reviewed  CBC WITH DIFFERENTIAL/PLATELET  COMPREHENSIVE METABOLIC PANEL WITH GFR  LIPASE, BLOOD  TROPONIN T, HIGH SENSITIVITY    EKG: None  Radiology: DG Chest Portable 1 View Result Date:  04/24/2024 CLINICAL DATA:  Known right lung tumor and increasing chest pain, initial encounter EXAM: PORTABLE CHEST 1 VIEW COMPARISON:  04/08/2024 CT FINDINGS: Cardiac shadow is mildly prominent. Left lung is clear. Large soft tissue mass is noted in the right mid and upper lung similar to that seen on prior CT. No new focal abnormality is noted. IMPRESSION: Stable right lung mass.  No new focal abnormality is noted. Electronically Signed   By: Violeta Grey M.D.   On: 04/24/2024 18:50     Procedures   Medications Ordered in the ED  ondansetron  (ZOFRAN ) injection 4 mg (has no administration in time range)  HYDROmorphone  (DILAUDID ) injection 0.5 mg (has no administration in time range)    Clinical Course as of 04/24/24 2059  Fri Apr 24, 2024  2027 Patient given 0.5 mg dilaudid  - rates pain at 7/10. I have ordered 1 mg  [AH]    Clinical Course User Index [AH] Tama Fails, PA-C                                 Medical Decision Making Amount and/or Complexity of Data Reviewed Labs: ordered. Radiology: ordered.  Risk Prescription drug management. Decision regarding hospitalization.   58 year old female here with terminal lung cancer diagnosis not under any active treatment under palliative care at this time with severe intractable pain unable to sleep.  Patient given IV half milligram Dilaudid  with only minimal improvement.  I have ordered a full milligram.  Case discussed with Dr. Brice Campi who will bring the patient in for pain control.  I have reached out to transition of care for hospice discussion with the patient as I think she would benefit from an increase level of pain control and care in her terminal diagnosis.  Patient does not look like she is in the late stages of life at this time she is still awake alert with good mentation and although she is  underweight she is not cachectic. I ordered labs which showed elevated white blood cell count and mild hyponatremia in the setting of  elevated blood glucose.  Patient also having some wheezing which resolved with treatment with DuoNeb and Solu-Medrol .     Final diagnoses:  None    ED Discharge Orders     None          Tama Fails, PA-C 04/24/24 2101    Lowery Rue, DO 04/24/24 2117

## 2024-04-24 NOTE — Care Management (Signed)
 ED RNCM received consult concerning hospice services.  Patient presented to ED  in intractable pain with lung Ca.  Noted CM  TOC note 5/15 hospice referral accepted by [facility].  ED RNCM contacted  On-Call liaison at [facility] to follow up in the am. Patient will be admitted. TOC will continue to follow.

## 2024-04-24 NOTE — H&P (Incomplete)
 History and Physical    Mary Erickson JXB:[id number] DOB: 1965/12/17 DOA: 04/24/2024  PCP: Nita Bast, NP  Patient coming from: [facility]  I have personally briefly reviewed patient's old medical records in [facility]  Chief Complaint: refractory cancer pain   HPI: Mary Erickson is a 58 y.o. female with medical history significant of   Terminal non-small cell lung carcinoma cancer, history of rheumatoid arthritis Sjogren disease, type 1 diabetes, ADHD, hypothyroidism, hyperlipidemia, internal carotid aneurysm, left eye blindness, migraine headaches, seizures, IBS, neurogenic bladder does self-catheterization, Chronic constipation  who presents to ED with complaint of  intractable cancer related pain x the last 24 hours which she was unable to control at home.Patient of note  was referred to hospice on 5/15 and accepted to [facility] and is not pursing any further treatment.   ED Course:  Afeb, BP138/66, hr 81, rr 16 sat 96%  Na 131, K 4.4, CL 93, glu 181, cr 0.91, alphos 150 Wbc 17.8, hgb 9.9, plt 400,  [id number]: Stable right lung mass.  No new focal abnormality is noted. EKG: nsr , pac Tx dilaudid  o.5 mg,douneb Review of Systems: As per HPI otherwise 10 point review of systems negative.   Past Medical History:  Diagnosis Date   Acid reflux    ADHD (attention deficit hyperactivity disorder)    Anemia    Arthritis    elbows, hands, neck, shoulders (10/02/2016)   Calcium  blood increased    Chronic neck pain    Chronic UTI (urinary tract infection)    from self caths (10/02/2016)   Hashimoto's disease    Hepatitis B    acute hepatitis B from lancet   History of alcoholism (HCC)    10/02/2016 sober since 03/14/2005   History of blood transfusion 1984; 1989; 2008   quite a few   History of shortness of breath    Hypercholesteremia    Hypothyroidism    no meds   Internal carotid aneurysm dx'd early 2017   Legally blind in left eye, as defined in  [place]     Melanoma (HCC)    left upper arm   Migraines    quite a few in a month; at least 15 (10/02/2016)   Nausea and vomiting 04/08/2024   Neuropathy    neck; not sure if it's from DM or Sjogren's (10/02/2016)   Pneumonia 1986   1 yr after major OR when I was under anesthesia for 13 hr   Seizures (HCC) ~ 2004; ~ 2013   seizures from diabetes    Self-catheterizes urinary bladder    since OR in 1984 (10/02/2016)   Sjogren's syndrome (HCC)    Thyroid  goiter    still present (10/02/2016)   Type II diabetes mellitus (HCC)    Urinary retention    self caths   Wears glasses     Past Surgical History:  Procedure Laterality Date   ABDOMINAL HYSTERECTOMY  2008   BILATERAL CARPAL TUNNEL RELEASE Bilateral    BRONCHIAL WASHINGS  12/10/2023   Procedure: BRONCHIAL WASHINGS;  Surgeon: Wilfredo Hanly, MD;  Location: [facility];  Service: Pulmonary;;   ECTOPIC PREGNANCY SURGERY  1989   ENDOBRONCHIAL ULTRASOUND N/A 12/10/2023   Procedure: ENDOBRONCHIAL ULTRASOUND;  Surgeon: Wilfredo Hanly, MD;  Location: [facility];  Service: Pulmonary;  Laterality: N/A;   EXCISION MELANOMA WITH SENTINEL LYMPH NODE BIOPSY Left 10/02/2016   Procedure: WIDE LOCAL EXCISION AND ADVANCEMENT FLAP CLOSURE LEFT UPPER  MELANOMA WITH SENTINEL LYMPH NODE MAPPING AND  BIOPSY;  Surgeon: Lockie Rima, MD;  Location: [facility] OR;  Service: General;  Laterality: Left;   EYE SURGERY     FINE NEEDLE ASPIRATION  12/10/2023   Procedure: FINE NEEDLE ASPIRATION (FNA) LINEAR;  Surgeon: Wilfredo Hanly, MD;  Location: [facility];  Service: Pulmonary;;   LAPAROSCOPIC CHOLECYSTECTOMY  ~ 01/2016   MELANOMA EXCISION WITH SENTINEL LYMPH NODE BIOPSY Left 10/02/2016   WIDE LOCAL EXCISION AND ADVANCEMENT FLAP CLOSURE LEFT UPPER  MELANOMA WITH SENTINEL LYMPH NODE MAPPING AND BIOPSY   OVARIAN CYST REMOVAL     SHOULDER ARTHROSCOPY W/ ROTATOR CUFF REPAIR Right 2014   partial   STRABISMUS SURGERY Left 1971   STRABISMUS  SURGERY Left 03/18/2020   Procedure: STRABISMUS REPAIR;  Surgeon: Dorothey Gate, MD;  Location: [facility];  Service: Ophthalmology;  Laterality: Left;   THYROIDECTOMY N/A 03/01/2023   Procedure: TOTAL THYROIDECTOMY;  Surgeon: Oralee Billow, MD;  Location: [facility];  Service: General;  Laterality: N/A;   TUMOR EXCISION  1984   from tail bone and part of tail bone removed   VIDEO BRONCHOSCOPY N/A 12/10/2023   Procedure: VIDEO BRONCHOSCOPY WITHOUT FLUORO;  Surgeon: Wilfredo Hanly, MD;  Location: [facility];  Service: Pulmonary;  Laterality: N/A;     reports that she quit smoking about 17 years ago. Her smoking use included cigarettes. She started smoking about 41 years ago. She has a 36 pack-year smoking history. She has never used smokeless tobacco. She reports that she does not currently use drugs after having used the following drugs: Marijuana. She reports that she does not drink alcohol.  Allergies  Allergen Reactions   Doxycycline Other (See Comments)    Elevates liver enzymes    Aspirin  Other (See Comments)    Can take 81 mg but has to be coated; No other Aspirin  due to bleeding in stools from ulcers   Nsaids Other (See Comments)    GI issues    Amoxicillin -Pot Clavulanate Nausea Only   Gluten Meal     Avoid due to autoimmune issues    Lactose     Avoid due to autoimmune issues    Codeine Nausea And Vomiting   Hydrocodone -Acetaminophen  Nausea And Vomiting   Oxycodone  Nausea And Vomiting   Sulfa Antibiotics Nausea And Vomiting   Sulfasalazine Nausea And Vomiting    Family History  Problem Relation Age of Onset   Multiple sclerosis Father    Colon cancer Neg Hx    Stomach cancer Neg Hx    Colon polyps Neg Hx    Esophageal cancer Neg Hx    Rectal cancer Neg Hx     Prior to Admission medications   Medication Sig Start Date End Date Taking? Authorizing Provider  acetaminophen  (TYLENOL ) 500 MG tablet Take 1,000 mg by mouth every 6 (six) hours as needed for  moderate pain.    [provider]  benzocaine  (ORAJEL) 10 % mucosal gel Use as directed 1 Application in the mouth or throat as needed for mouth pain.    [provider]  Cholecalciferol  (VITAMIN D3) 125 MCG (5000 UT) capsule Take 5,000 Units by mouth daily.    [provider]  Continuous Glucose Sensor (DEXCOM G7 SENSOR) MISC Inject 1 applicator into the skin See admin instructions. Every 10 days 11/27/23   [provider]  Cyanocobalamin  (B-12) 3000 MCG CAPS Take 3,000 mcg by mouth daily.    [provider]  Dexlansoprazole  (DEXILANT ) 30 MG capsule DR Take 1 capsule (30 mg  total) by mouth daily. 04/11/24   Sueellen Emery, MD  diclofenac  Sodium (VOLTAREN ) 1 % GEL Apply 2 g topically 4 (four) times daily. Patient taking differently: Apply 2 g topically 4 (four) times daily as needed (pain). 12/17/19   Darr, Jacob, PA-C  diphenhydrAMINE  HCl (BENADRYL  ALLERGY PO) Take 25 mg by mouth at bedtime.    [provider]  fentaNYL  (DURAGESIC ) 12 MCG/HR Place 1 patch onto the skin every 3 (three) days. Patient not taking: Reported on 04/08/2024 03/28/24   Vann, Jessica U, DO  fluticasone-salmeterol (ADVAIR  HFA) 115-21 MCG/ACT inhaler Inhale 2 puffs into the lungs 2 (two) times daily. 01/29/24   Wilfredo Hanly, MD  glucose blood (ACCU-CHEK GUIDE) test strip 1 each by Other route as needed. 06/05/23   [provider]  HYDROcodone  bit-homatropine (HYCODAN) 5-1.5 MG/5ML syrup Take 5 mLs by mouth every 4 (four) hours as needed for cough. Patient not taking: Reported on 04/08/2024 03/26/24   Vann, Jessica U, DO  ibuprofen  (ADVIL ) 400 MG tablet Take 400 mg by mouth every 6 (six) hours as needed for moderate pain (pain score 4-6).    [provider]  insulin  detemir (LEVEMIR  FLEXTOUCH) 100 UNIT/ML FlexPen Inject 5 Units into the skin 2 (two) times daily. 04/10/24   Oral Billings, MD  Insulin  lispro (HUMALOG JUNIOR [employer]) 100 UNIT/ML Inject 1-7 Units  into the skin as needed (high blood sugar).    [provider]  Ketotifen  Fumarate ([employer] ALLERGY EYE DROPS OP) Place 1 drop into both eyes 2 (two) times daily.    [provider]  levothyroxine  (SYNTHROID ) 200 MCG tablet Take 1 tablet (200 mcg total) by mouth daily at 6 (six) AM. 04/11/24 05/11/24  Oral Billings, MD  linaclotide  (LINZESS ) 145 MCG CAPS capsule Take 1 capsule (145 mcg total) by mouth daily before breakfast. 11/14/23   Daina Drum, MD  LORazepam  (ATIVAN ) 1 MG tablet Take 1 tablet (1 mg total) by mouth every 4 (four) hours as needed for anxiety or sleep (or nausea). Patient not taking: Reported on 04/08/2024 03/26/24   Vann, Jessica U, DO  Melatonin 10-10 MG TBCR Take 10 mg by mouth at bedtime.    [provider]  methylphenidate  36 MG PO CR tablet Take 36 mg by mouth every morning.    [provider]  Morphine  Sulfate (MORPHINE  CONCENTRATE) 10 mg / 0.5 ml concentrated solution Place 0.5 mLs (10 mg total) under the tongue every 2 (two) hours as needed for moderate pain (pain score 4-6), severe pain (pain score 7-10), anxiety or shortness of breath (breakthrough pain). Patient not taking: Reported on 04/08/2024 03/26/24   Vann, Jessica U, DO  Multiple Vitamins-Minerals (MULTIVITAMINS THER. W/MINERALS) TABS Take 1 tablet by mouth daily.    [provider]  nystatin cream (MYCOSTATIN) Apply 1 Application topically 2 (two) times daily as needed for dry skin.    [provider]  omeprazole  (PRILOSEC) 20 MG capsule Take 1 capsule (20 mg total) by mouth 2 (two) times daily before a meal. 11/14/23   Daina Drum, MD  ondansetron  (ZOFRAN  ODT) 4 MG disintegrating tablet Take 1 tablet (4 mg total) by mouth every 6 (six) hours as needed for nausea or vomiting. 06/18/23   Esterwood, Amy S, PA-C  Polyethyl Glycol-Propyl Glycol (SYSTANE OP) Place 1 drop into both eyes 3 (three) times daily as needed (dry eyes).    [provider]  polyethylene  glycol powder (GLYCOLAX /MIRALAX ) 17 GM/SCOOP powder Take 17 g  by mouth 2 (two) times daily. 04/10/24   Oral Billings, MD  predniSONE  (DELTASONE ) 5 MG tablet Take 1 tablet (5 mg total) by mouth 2 (two) times daily. 12/25/23   Macdonald Savoy, MD  promethazine  (PHENERGAN ) 25 MG suppository Place 1 suppository (25 mg total) rectally every 6 (six) hours as needed for nausea or vomiting. 04/20/24   Mozell Arias, MD  promethazine  (PHENERGAN ) 25 MG tablet Take 1 tablet (25 mg total) by mouth every 6 (six) hours as needed for nausea or vomiting. 04/11/24   Sueellen Emery, MD    Physical Exam: Vitals:   04/24/24 1945 04/24/24 2000 04/24/24 2015 04/24/24 2149  BP:  (!) 124/57  131/62  Pulse: 74 88  83  Resp: 19 13 20 16   Temp:    98.1 F (36.7 C)  TempSrc:    Oral  SpO2: 95% 93%  95%  Weight:      Height:        Constitutional: NAD, calm, comfortable Vitals:   04/24/24 1945 04/24/24 2000 04/24/24 2015 04/24/24 2149  BP:  (!) 124/57  131/62  Pulse: 74 88  83  Resp: 19 13 20 16   Temp:    98.1 F (36.7 C)  TempSrc:    Oral  SpO2: 95% 93%  95%  Weight:      Height:       Eyes: PERRL, lids and conjunctivae normal ENMT: Mucous membranes are moist. Posterior pharynx clear of any exudate or lesions.Normal dentition.  Neck: normal, supple, no masses, no thyromegaly Respiratory: clear to auscultation bilaterally, no wheezing, no crackles. Normal respiratory effort. No accessory muscle use.  Cardiovascular: Regular rate and rhythm, no murmurs / rubs / gallops. No extremity edema. 2+ pedal pulses. No carotid bruits.  Abdomen: no tenderness, no masses palpated. No hepatosplenomegaly. Bowel sounds positive.  Musculoskeletal: no clubbing / cyanosis. No joint deformity upper and lower extremities. Good ROM, no contractures. Normal muscle tone.  Skin: no rashes, lesions, ulcers. No induration Neurologic: CN 2-12 grossly intact. Sensation intact, DTR normal. Strength 5/5 in all 4.  Psychiatric:  Normal judgment and insight. Alert and oriented x 3. Normal mood.    Labs on Admission: I have personally reviewed following labs and imaging studies  CBC: Recent Labs  Lab 04/20/24 2141 04/24/24 1818  WBC 15.6* 17.8*  NEUTROABS  --  16.5*  HGB 10.9* 9.9*  HCT 34.5* 30.6*  MCV 85.0 84.8  PLT 235 400   Basic Metabolic Panel: Recent Labs  Lab 04/20/24 2141 04/24/24 1818  NA 131* 131*  K 4.4 4.4  CL 93* 93*  CO2 26 25  GLUCOSE 189* 181*  BUN 9 16  CREATININE 0.70 0.91  CALCIUM  9.6 9.5   GFR: Estimated Creatinine Clearance: 58.8 mL/min (by C-G formula based on SCr of 0.91 mg/dL). Liver Function Tests: Recent Labs  Lab 04/20/24 2141 04/24/24 1818  AST 45* 38  ALT 24 22  ALKPHOS 157* 150*  BILITOT 0.3 0.3  PROT 7.6 7.8  ALBUMIN 3.6 3.7   Recent Labs  Lab 04/20/24 2141 04/24/24 1818  LIPASE 12 12   No results for input(s): AMMONIA in the last 168 hours. Coagulation Profile: No results for input(s): INR, PROTIME in the last 168 hours. Cardiac Enzymes: No results for input(s): CKTOTAL, CKMB, CKMBINDEX, TROPONINI in the last 168 hours. BNP (last 3 results) No results for input(s): PROBNP in the last 8760 hours. HbA1C: No results for input(s): HGBA1C in the last 72 hours. CBG: No results  for input(s): GLUCAP in the last 168 hours. Lipid Profile: No results for input(s): CHOL, HDL, LDLCALC, TRIG, CHOLHDL, LDLDIRECT in the last 72 hours. Thyroid  Function Tests: No results for input(s): TSH, T4TOTAL, FREET4, T3FREE, THYROIDAB in the last 72 hours. Anemia Panel: No results for input(s): VITAMINB12, FOLATE, FERRITIN, TIBC, IRON, RETICCTPCT in the last 72 hours. Urine analysis:    Component Value Date/Time   COLORURINE STRAW (A) 04/20/2024 2141   APPEARANCEUR CLEAR 04/20/2024 2141   LABSPEC 1.015 04/20/2024 2141   PHURINE 7.5 04/20/2024 2141   GLUCOSEU NEGATIVE 04/20/2024 2141   HGBUR NEGATIVE 04/20/2024  2141   BILIRUBINUR NEGATIVE 04/20/2024 2141   BILIRUBINUR negative 12/22/2023 1234   KETONESUR NEGATIVE 04/20/2024 2141   PROTEINUR NEGATIVE 04/20/2024 2141   UROBILINOGEN 0.2 12/22/2023 1234   UROBILINOGEN 0.2 11/21/2022 1337   NITRITE NEGATIVE 04/20/2024 2141   LEUKOCYTESUR NEGATIVE 04/20/2024 2141    Radiological Exams on Admission: DG Chest Portable 1 View Result Date: 04/24/2024 CLINICAL DATA:  Known right lung tumor and increasing chest pain, initial encounter EXAM: PORTABLE CHEST 1 VIEW COMPARISON:  04/08/2024 CT FINDINGS: Cardiac shadow is mildly prominent. Left lung is clear. Large soft tissue mass is noted in the right mid and upper lung similar to that seen on prior CT. No new focal abnormality is noted. IMPRESSION: Stable right lung mass.  No new focal abnormality is noted. Electronically Signed   By: Violeta Grey M.D.   On: 04/24/2024 18:50    EKG: Independently reviewed. See above  Assessment/Plan  Terminal Lung Cancer now with intractable cancer related pain  -admit to med/surg - patient is currently admitted to hospice, [facility] will f/u with full consult in am  - patient currently not seeking further treatment and wishes are for comfort  - will placed comfort order set  -will continue with iv pain medication   Terminal non-small cell lung carcinoma cancer, history of rheumatoid arthritis Sjogren disease, type 1 diabetes, ADHD, hypothyroidism, hyperlipidemia, internal carotid aneurysm, left eye blindness, migraine headaches, seizures, IBS, neurogenic bladder does self-catheterization, Chronic constipation   DVT prophylaxis: *** (Lovenox /Heparin/SCD's/anticoagulated/None (if comfort care) Code Status: *** (Full/Partial (specify details) Family Communication: *** (Specify name, relationship. Do not write discussed with patient. Specify tel # if discussed over the phone) Disposition Plan: *** (specify when and where you expect patient to be discharged) Consults  called: *** (with names) Admission status: *** (inpatient / obs / tele / medical floor / SDU)   Sabas Cradle MD [facility] Pager [phone]  If 7PM-7AM, please contact night-coverage [email or web address] Password [facility]  04/24/2024, 10:39 PM

## 2024-04-24 NOTE — ED Triage Notes (Signed)
 Pt reports R lung tumor and reports pain over the last few nights. Pt also endorses nausea. Pt currently in between multiple providers between PCP, hospice, and cancer MD.

## 2024-04-24 NOTE — ED Notes (Signed)
 Patient states she has a new nebulizer machine but has never received a prescription for the medication to put in it.

## 2024-04-24 NOTE — Progress Notes (Signed)
 patient brought supplies-checked glucose on arrival-informed patient that she does not need to check herself at the hospital or give herself insulin . We will check and provide per orders, Patient administered 5 units of her own insulin  at this time.

## 2024-04-25 DIAGNOSIS — Z85820 Personal history of malignant melanoma of skin: Secondary | ICD-10-CM | POA: Diagnosis not present

## 2024-04-25 DIAGNOSIS — Z820 Family history of epilepsy and other diseases of the nervous system: Secondary | ICD-10-CM | POA: Diagnosis not present

## 2024-04-25 DIAGNOSIS — Z79890 Hormone replacement therapy (postmenopausal): Secondary | ICD-10-CM | POA: Diagnosis not present

## 2024-04-25 DIAGNOSIS — N319 Neuromuscular dysfunction of bladder, unspecified: Secondary | ICD-10-CM | POA: Diagnosis present

## 2024-04-25 DIAGNOSIS — H548 Legal blindness, as defined in USA: Secondary | ICD-10-CM | POA: Diagnosis present

## 2024-04-25 DIAGNOSIS — R52 Pain, unspecified: Secondary | ICD-10-CM | POA: Diagnosis present

## 2024-04-25 DIAGNOSIS — K581 Irritable bowel syndrome with constipation: Secondary | ICD-10-CM | POA: Diagnosis present

## 2024-04-25 DIAGNOSIS — M3500 Sicca syndrome, unspecified: Secondary | ICD-10-CM | POA: Diagnosis present

## 2024-04-25 DIAGNOSIS — E7800 Pure hypercholesterolemia, unspecified: Secondary | ICD-10-CM | POA: Diagnosis present

## 2024-04-25 DIAGNOSIS — Z515 Encounter for palliative care: Secondary | ICD-10-CM | POA: Diagnosis not present

## 2024-04-25 DIAGNOSIS — Z66 Do not resuscitate: Secondary | ICD-10-CM | POA: Diagnosis present

## 2024-04-25 DIAGNOSIS — Z87891 Personal history of nicotine dependence: Secondary | ICD-10-CM | POA: Diagnosis not present

## 2024-04-25 DIAGNOSIS — D72829 Elevated white blood cell count, unspecified: Secondary | ICD-10-CM | POA: Diagnosis present

## 2024-04-25 DIAGNOSIS — Z681 Body mass index (BMI) 19 or less, adult: Secondary | ICD-10-CM | POA: Diagnosis not present

## 2024-04-25 DIAGNOSIS — Z794 Long term (current) use of insulin: Secondary | ICD-10-CM | POA: Diagnosis not present

## 2024-04-25 DIAGNOSIS — R569 Unspecified convulsions: Secondary | ICD-10-CM | POA: Diagnosis present

## 2024-04-25 DIAGNOSIS — C3411 Malignant neoplasm of upper lobe, right bronchus or lung: Secondary | ICD-10-CM | POA: Diagnosis present

## 2024-04-25 DIAGNOSIS — G43909 Migraine, unspecified, not intractable, without status migrainosus: Secondary | ICD-10-CM | POA: Diagnosis present

## 2024-04-25 DIAGNOSIS — E890 Postprocedural hypothyroidism: Secondary | ICD-10-CM | POA: Diagnosis present

## 2024-04-25 DIAGNOSIS — M069 Rheumatoid arthritis, unspecified: Secondary | ICD-10-CM | POA: Diagnosis present

## 2024-04-25 DIAGNOSIS — G893 Neoplasm related pain (acute) (chronic): Secondary | ICD-10-CM | POA: Diagnosis present

## 2024-04-25 DIAGNOSIS — E1065 Type 1 diabetes mellitus with hyperglycemia: Secondary | ICD-10-CM | POA: Diagnosis present

## 2024-04-25 DIAGNOSIS — Z8701 Personal history of pneumonia (recurrent): Secondary | ICD-10-CM | POA: Diagnosis not present

## 2024-04-25 DIAGNOSIS — F909 Attention-deficit hyperactivity disorder, unspecified type: Secondary | ICD-10-CM | POA: Diagnosis present

## 2024-04-25 LAB — COMPREHENSIVE METABOLIC PANEL WITH GFR
ALT: 18 U/L (ref 0–44)
AST: 27 U/L (ref 15–41)
Albumin: 2.9 g/dL — ABNORMAL LOW (ref 3.5–5.0)
Alkaline Phosphatase: 107 U/L (ref 38–126)
Anion gap: 8 (ref 5–15)
BUN: 17 mg/dL (ref 6–20)
CO2: 26 mmol/L (ref 22–32)
Calcium: 8.6 mg/dL — ABNORMAL LOW (ref 8.9–10.3)
Chloride: 97 mmol/L — ABNORMAL LOW (ref 98–111)
Creatinine, Ser: 0.74 mg/dL (ref 0.44–1.00)
GFR, Estimated: >60 mL/min (ref >60)
Glucose, Bld: 89 mg/dL (ref 70–99)
Potassium: 3.9 mmol/L (ref 3.5–5.1)
Sodium: 131 mmol/L — ABNORMAL LOW (ref 135–145)
Total Bilirubin: 0.5 mg/dL (ref 0.0–1.2)
Total Protein: 6.8 g/dL (ref 6.5–8.1)

## 2024-04-25 LAB — CBC
HCT: 31.7 % — ABNORMAL LOW (ref 36.0–46.0)
HCT: 32.6 % — ABNORMAL LOW (ref 36.0–46.0)
Hemoglobin: 10.1 g/dL — ABNORMAL LOW (ref 12.0–15.0)
Hemoglobin: 10.1 g/dL — ABNORMAL LOW (ref 12.0–15.0)
MCH: 27.8 pg (ref 26.0–34.0)
MCH: 27.2 pg (ref 26.0–34.0)
MCHC: 31.9 g/dL (ref 30.0–36.0)
MCHC: 31 g/dL (ref 30.0–36.0)
MCV: 87.3 fL (ref 80.0–100.0)
MCV: 87.6 fL (ref 80.0–100.0)
Platelets: 408 10*3/uL — ABNORMAL HIGH (ref 150–400)
Platelets: 419 10*3/uL — ABNORMAL HIGH (ref 150–400)
RBC: 3.63 MIL/uL — ABNORMAL LOW (ref 3.87–5.11)
RBC: 3.72 MIL/uL — ABNORMAL LOW (ref 3.87–5.11)
RDW: 16 % — ABNORMAL HIGH (ref 11.5–15.5)
RDW: 16.1 % — ABNORMAL HIGH (ref 11.5–15.5)
WBC: 18.5 10*3/uL — ABNORMAL HIGH (ref 4.0–10.5)
WBC: 20.2 10*3/uL — ABNORMAL HIGH (ref 4.0–10.5)
nRBC: 0 % (ref 0.0–0.2)
nRBC: 0 % (ref 0.0–0.2)

## 2024-04-25 LAB — GLUCOSE, CAPILLARY
Glucose-Capillary: 172 mg/dL — ABNORMAL HIGH (ref 70–99)
Glucose-Capillary: 49 mg/dL — ABNORMAL LOW (ref 70–99)
Glucose-Capillary: 69 mg/dL — ABNORMAL LOW (ref 70–99)
Glucose-Capillary: 185 mg/dL — ABNORMAL HIGH (ref 70–99)
Glucose-Capillary: 94 mg/dL (ref 70–99)
Glucose-Capillary: 117 mg/dL — ABNORMAL HIGH (ref 70–99)

## 2024-04-25 LAB — PHOSPHORUS: Phosphorus: 2.2 mg/dL — ABNORMAL LOW (ref 2.5–4.6)

## 2024-04-25 LAB — MAGNESIUM: Magnesium: 2.3 mg/dL (ref 1.7–2.4)

## 2024-04-25 MED ORDER — PANTOPRAZOLE SODIUM 40 MG PO TBEC
40.0000 mg | DELAYED_RELEASE_TABLET | Freq: Every day | ORAL | Status: DC
  Administered 2024-04-25 – 2024-04-28 (×3): 40 mg via ORAL
  Filled 2024-04-25 (×4): qty 1

## 2024-04-25 MED ORDER — K PHOS MONO-SOD PHOS DI & MONO 155-852-130 MG PO TABS
500.0000 mg | ORAL_TABLET | Freq: Two times a day (BID) | ORAL | Status: AC
  Administered 2024-04-25 – 2024-04-26 (×3): 500 mg via ORAL
  Filled 2024-04-25 (×3): qty 2

## 2024-04-25 MED ORDER — METHYLPHENIDATE HCL ER (OSM) 18 MG PO TBCR: 36.0000 mg | EXTENDED_RELEASE_TABLET | Freq: Every morning | ORAL | Status: DC

## 2024-04-25 MED ORDER — INSULIN GLARGINE-YFGN 100 UNIT/ML ~~LOC~~ SOLN
5.0000 [IU] | Freq: Two times a day (BID) | SUBCUTANEOUS | Status: DC
  Filled 2024-04-25: qty 0.05

## 2024-04-25 MED ORDER — FLUTICASONE FUROATE-VILANTEROL 200-25 MCG/ACT IN AEPB
1.0000 | INHALATION_SPRAY | Freq: Every day | RESPIRATORY_TRACT | Status: DC
  Administered 2024-04-25 – 2024-04-28 (×3): 1 via RESPIRATORY_TRACT
  Filled 2024-04-25: qty 28

## 2024-04-25 MED ORDER — LEVOTHYROXINE SODIUM 100 MCG PO TABS
200.0000 ug | ORAL_TABLET | Freq: Every day | ORAL | Status: DC
  Administered 2024-04-25 – 2024-04-28 (×3): 200 ug via ORAL
  Filled 2024-04-25 (×4): qty 2

## 2024-04-25 MED ORDER — MORPHINE SULFATE 15 MG PO TABS
15.0000 mg | ORAL_TABLET | Freq: Three times a day (TID) | ORAL | Status: DC | PRN
  Administered 2024-04-25 – 2024-04-26 (×2): 15 mg via ORAL
  Filled 2024-04-25 (×2): qty 1

## 2024-04-25 MED ORDER — VITAMIN E 45 MG (100 UNIT) PO CAPS
400.0000 [IU] | ORAL_CAPSULE | Freq: Every day | ORAL | Status: DC
  Administered 2024-04-25 – 2024-04-26 (×2): 400 [IU] via ORAL
  Filled 2024-04-25 (×3): qty 4

## 2024-04-25 MED ORDER — PROMETHAZINE HCL 25 MG RE SUPP
25.0000 mg | Freq: Four times a day (QID) | RECTAL | Status: DC | PRN
  Administered 2024-04-25 – 2024-04-28 (×3): 25 mg via RECTAL
  Filled 2024-04-25 (×6): qty 1

## 2024-04-25 MED ORDER — MORPHINE SULFATE ER 30 MG PO TBCR
30.0000 mg | EXTENDED_RELEASE_TABLET | Freq: Two times a day (BID) | ORAL | Status: DC
  Administered 2024-04-25 – 2024-04-28 (×5): 30 mg via ORAL
  Filled 2024-04-25 (×8): qty 1

## 2024-04-25 MED ORDER — ADULT MULTIVITAMIN W/MINERALS CH
1.0000 | ORAL_TABLET | Freq: Every day | ORAL | Status: DC
  Administered 2024-04-25 – 2024-04-28 (×3): 1 via ORAL
  Filled 2024-04-25 (×4): qty 1

## 2024-04-25 MED ORDER — INSULIN ASPART 100 UNIT/ML IJ SOLN: 0.0000 [IU] | Freq: Three times a day (TID) | INTRAMUSCULAR | Status: DC

## 2024-04-25 MED ORDER — INSULIN ASPART 100 UNIT/ML IJ SOLN: 0.0000 [IU] | Freq: Every day | INTRAMUSCULAR | Status: DC

## 2024-04-25 MED ORDER — POLYETHYLENE GLYCOL 3350 17 G PO PACK
17.0000 g | PACK | Freq: Two times a day (BID) | ORAL | Status: DC
  Administered 2024-04-25 – 2024-04-28 (×5): 17 g via ORAL
  Filled 2024-04-25 (×7): qty 1

## 2024-04-25 MED ORDER — INSULIN GLARGINE-YFGN 100 UNIT/ML ~~LOC~~ SOLN
8.0000 [IU] | Freq: Two times a day (BID) | SUBCUTANEOUS | Status: DC
  Administered 2024-04-25 – 2024-04-26 (×2): 8 [IU] via SUBCUTANEOUS
  Filled 2024-04-25 (×6): qty 0.08

## 2024-04-25 MED ORDER — INSULIN ASPART 100 UNIT/ML IJ SOLN
0.0000 [IU] | Freq: Three times a day (TID) | INTRAMUSCULAR | Status: DC
  Administered 2024-04-25: 2 [IU] via SUBCUTANEOUS
  Administered 2024-04-26: 1 [IU] via SUBCUTANEOUS
  Administered 2024-04-28: 2 [IU] via SUBCUTANEOUS

## 2024-04-25 MED ORDER — SODIUM CHLORIDE 0.9 % IV SOLN: INTRAVENOUS | Status: DC

## 2024-04-25 MED ORDER — PREDNISONE 5 MG PO TABS
5.0000 mg | ORAL_TABLET | Freq: Two times a day (BID) | ORAL | Status: DC
  Administered 2024-04-25 – 2024-04-28 (×5): 5 mg via ORAL
  Filled 2024-04-25 (×7): qty 1

## 2024-04-25 MED ORDER — HEPARIN SODIUM (PORCINE) 5000 UNIT/ML IJ SOLN: 5000.0000 [IU] | Freq: Three times a day (TID) | INTRAMUSCULAR | Status: DC

## 2024-04-25 MED ORDER — MELATONIN 5 MG PO TABS
10.0000 mg | ORAL_TABLET | Freq: Every day | ORAL | Status: DC
  Administered 2024-04-25 – 2024-04-26 (×2): 10 mg via ORAL
  Filled 2024-04-25 (×3): qty 2

## 2024-04-25 MED ORDER — LINACLOTIDE 145 MCG PO CAPS
145.0000 ug | ORAL_CAPSULE | Freq: Every day | ORAL | Status: DC
  Administered 2024-04-26 – 2024-04-28 (×2): 145 ug via ORAL
  Filled 2024-04-25 (×3): qty 1

## 2024-04-25 NOTE — Progress Notes (Signed)
 PROGRESS NOTE  Mary BALDRIDGE AOZ:[id number] DOB: 11/08/1966 DOA: 04/24/2024 PCP: Nita Bast, NP   LOS: 0 days   Brief narrative:  Mary Erickson is a 58 y.o. female with medical history significant for advanced non-small cell lung carcinoma cancer, history of rheumatoid arthritis, Sjogren disease, type 1 diabetes, ADHD, hypothyroidism, hyperlipidemia, internal carotid aneurysm, left eye blindness, migraine headaches, seizures, IBS, neurogenic bladder does self-catheterization, Chronic constipation who presented to the hospital with intractable cancer-related pain for 24 hours. Patient  was referred to hospice on 5/15 and accepted to [facility] and is not pursing any further cancer treatment.  She stated that she was not able to get her medications so had to come to the hospital.  In the ED vitals were stable.  Labs showed sodium low at 131.  Chest x-ray with stable right lung mass.  EKG normal sinus rhythm.    Assessment/Plan: Principal Problem:   Intractable pain    Advanced lung Cancer now with intractable cancer related pain  Currently palliative care status.  Failure to be followed by acute care but did not get her medications.  Continue with IV narcotics, long-acting and short acting morphine .  Bowel regimen.,  Pain better controlled at this time.  Communicated with TOC team    Rheumatoid arthritis  Sjogren disease Continue prednisone    Type 1 diabetes melitis - Continue Lantus  sliding scale insulin  Accu-Chek  Hypophosphatemia.  Will replenish with K-Phos packet.   Leukocytosis Stable secondary to cancer.  No signs of infection.   ADHD -resume home regimen    Hypothyroidism Continue Synthroid    Hyperlipidemia    Internal carotid aneurysm  -sequela of eft eye blindness   Migraine headaches -no active issues treat prn     Seizures    IBS Chronic constipation On Linzess     Neurogenic bladder  -on self-catheterization DVT prophylaxis: heparin  injection 5,000 Units Start: 04/25/24 0600   Disposition: Home with palliative care  Status is: Observation The patient will require care spanning > 2 midnights and should be moved to inpatient because: Intractable pain requiring IV narcotics, coordination of care on discharge.    Code Status:     Code Status: Limited: Do not attempt resuscitation (DNR) -DNR-LIMITED -Do Not Intubate/DNI   Family Communication: None at bedside  Consultants: None  Procedures: None  Anti-infectives:  None  Anti-infectives (From admission, onward)    None        Subjective: Today, patient was seen and examined at bedside.  States that her pain is better on the IV narcotics.  She was frustrated about not being able to get medications at home.  Denies any nausea vomiting fevers or chills.  No shortness of breath.  Objective: Vitals:   04/25/24 0559 04/25/24 0937  BP: 120/66 (!) 95/57  Pulse: 60 (!) 57  Resp: 16 16  Temp: 97.9 F (36.6 C) 98.5 F (36.9 C)  SpO2: 96% 94%    Intake/Output Summary (Last 24 hours) at 04/25/2024 1031 Last data filed at 04/25/2024 0945 Gross per 24 hour  Intake 240 ml  Output 2 ml  Net 238 ml   Filed Weights   04/24/24 1807  Weight: 55.3 kg   Body mass index is 18.55 kg/m.   Physical Exam: GENERAL: Patient is alert awake and oriented. Not in obvious distress.  Thinly built, HENT: No scleral pallor or icterus. Pupils equally reactive to light. Oral mucosa is moist NECK: is supple, no gross swelling noted. CHEST:   Diminished breath sounds bilaterally.  CVS: S1 and S2 heard, no murmur. Regular rate and rhythm.  ABDOMEN: Soft, non-tender, bowel sounds are present. EXTREMITIES: No edema. CNS: Cranial nerves are intact. No focal motor deficits. SKIN: warm and dry without rashes.  Data Review: I have personally reviewed the following laboratory data and studies,  CBC: Recent Labs  Lab 04/20/24 2141 04/24/24 1818 04/25/24 0607  WBC 15.6* 17.8*  18.5*  NEUTROABS  --  16.5*  --   HGB 10.9* 9.9* 10.1*  HCT 34.5* 30.6* 31.7*  MCV 85.0 84.8 87.3  PLT 235 400 408*   Basic Metabolic Panel: Recent Labs  Lab 04/20/24 2141 04/24/24 1818  NA 131* 131*  K 4.4 4.4  CL 93* 93*  CO2 26 25  GLUCOSE 189* 181*  BUN 9 16  CREATININE 0.70 0.91  CALCIUM  9.6 9.5   Liver Function Tests: Recent Labs  Lab 04/20/24 2141 04/24/24 1818  AST 45* 38  ALT 24 22  ALKPHOS 157* 150*  BILITOT 0.3 0.3  PROT 7.6 7.8  ALBUMIN 3.6 3.7   Recent Labs  Lab 04/20/24 2141 04/24/24 1818  LIPASE 12 12   No results for input(s): AMMONIA in the last 168 hours. Cardiac Enzymes: No results for input(s): CKTOTAL, CKMB, CKMBINDEX, TROPONINI in the last 168 hours. BNP (last 3 results) No results for input(s): BNP in the last 8760 hours.  ProBNP (last 3 results) No results for input(s): PROBNP in the last 8760 hours.  CBG: Recent Labs  Lab 04/25/24 0736  GLUCAP 172*   No results found for this or any previous visit (from the past 240 hours).   Studies: DG Chest Portable 1 View Result Date: 04/24/2024 CLINICAL DATA:  Known right lung tumor and increasing chest pain, initial encounter EXAM: PORTABLE CHEST 1 VIEW COMPARISON:  04/08/2024 CT FINDINGS: Cardiac shadow is mildly prominent. Left lung is clear. Large soft tissue mass is noted in the right mid and upper lung similar to that seen on prior CT. No new focal abnormality is noted. IMPRESSION: Stable right lung mass.  No new focal abnormality is noted. Electronically Signed   By: Violeta Grey M.D.   On: 04/24/2024 18:50      Rosena Conradi, MD  [facility] 04/25/2024  If 7PM-7AM, please contact night-coverage

## 2024-04-25 NOTE — Hospital Course (Signed)
 Mary Erickson is a 58 y.o. female with medical history significant of  For tramadol  non-small cell lung carcinoma cancer, history of rheumatoid arthritis, Sjogren disease, type 1 diabetes, ADHD, hypothyroidism, hyperlipidemia, internal carotid aneurysm, left eye blindness, migraine headaches, seizures, IBS, neurogenic bladder does self-catheterization, Chronic constipation who presented to the hospital with intractable cancer-related pain for 24 hours. Patient  was referred to hospice on 5/15 and accepted to [facility] and is not pursing any further cancer treatment.  In the ED vitals were stable.  Labs showed sodium low at 131.  Chest x-ray with stable right lung mass.  EKG normal sinus rhythm.  Assessment/Plan   Terminal Lung Cancer now with intractable cancer related pain  Currently hospice status.  Continue with IV narcotics, long-acting and short acting morphine .  Bowel regimen.,    Rheumatoid arthritis  Sjogren disease Continue prednisone    Type 1 diabetes melitis - Continue Lantus  sliding scale insulin  Accu-Chek   Leukocytosis Stable secondary to cancer   ADHD -resume home regimen    Hypothyroidism Continue Synthroid    Hyperlipidemia    Internal carotid aneurysm  -sequela of eft eye blindness   Migraine headaches -no active issues treat prn     Seizures     IBS Chronic constipation On Linzess     Neurogenic bladder  -on self-catheterization

## 2024-04-25 NOTE — TOC Progression Note (Addendum)
 Transition of Care [facility]) - Progression Note    Patient Details  Name: Mary Erickson MRN: [id number] Date of Birth: Dec 12, 1965  Transition of Care [facility]) CM/SW Contact  Katrine Parody, LCSW Phone Number: 04/25/2024, 11:40 AM  Clinical Narrative:     CSW received  from MD including RN At [facility].  Pt in hospital related to pain, [facility] is Hospice provider and TOC notified them of admission 6/13.  CSW included weekend hospice rep, Grover Ledger, in chat- she was on route to hospital to meet with pt and potentially offer [facility] residential.  [facility] to follow for DC needs.    Addendum: Follow up-from N Pittman- pt declined offer of [employer] [employer]- does not want to move. TOC following.    Expected Discharge Plan and Services                                               Social Determinants of Health (SDOH) Interventions SDOH Screenings   Food Insecurity: No Food Insecurity (04/24/2024)  Housing: Low Risk  (04/24/2024)  Transportation Needs: No Transportation Needs (04/24/2024)  Utilities: Not At Risk (04/24/2024)  Depression (PHQ2-9): Medium Risk (06/30/2019)  Financial Resource Strain: Low Risk  (02/13/2024)   Received from [facility]  Physical Activity: Unknown (08/13/2023)   Received from [facility]  Social Connections: Moderately Integrated (04/09/2024)  Stress: Patient Declined (08/13/2023)   Received from [facility]  Tobacco Use: Medium Risk (04/24/2024)    Readmission Risk Interventions    03/23/2024   12:39 PM 12/09/2023    8:24 AM  Readmission Risk Prevention Plan  Transportation Screening Complete Complete  PCP or Specialist Appt within 5-7 Days  Complete  PCP or Specialist Appt within 3-5 Days Complete   Home Care Screening  Complete  Medication Review (RN CM)  Complete  HRI or Home Care Consult Complete   Social Work Consult for Recovery Care Planning/Counseling Complete   Palliative Care Screening Complete   Medication Review Furniture conservator/restorer) Complete

## 2024-04-25 NOTE — Plan of Care (Signed)

## 2024-04-25 NOTE — Progress Notes (Signed)
 Mary Erickson [phone] [facility] Liaison note:   This patient is currently enrolled in [facility] outpatient-based palliative care.   Hospital Liaison will continue to follow for discharge disposition.   Please call for any outpatient based palliative care related questions or concerns.   Thank you,    Jacqlyn Matas, BSN, RN Hospice Nurse Liaison [phone]

## 2024-04-25 NOTE — Progress Notes (Signed)
 Patients blood glucose was 49. Message sent to MD. Mary Erickson of apple juice was given. Checked blood sugar 15 minutes later. Glucose is now 69.

## 2024-04-25 NOTE — Plan of Care (Signed)
  Problem: Pain Managment: Goal: General experience of comfort will improve and/or be controlled Outcome: Progressing

## 2024-04-26 DIAGNOSIS — R52 Pain, unspecified: Secondary | ICD-10-CM | POA: Diagnosis not present

## 2024-04-26 LAB — PHOSPHORUS: Phosphorus: 2.7 mg/dL (ref 2.5–4.6)

## 2024-04-26 LAB — CBC
HCT: 37.7 % (ref 36.0–46.0)
Hemoglobin: 11.1 g/dL — ABNORMAL LOW (ref 12.0–15.0)
MCH: 26.9 pg (ref 26.0–34.0)
MCHC: 29.4 g/dL — ABNORMAL LOW (ref 30.0–36.0)
MCV: 91.5 fL (ref 80.0–100.0)
Platelets: 248 10*3/uL (ref 150–400)
RBC: 4.12 MIL/uL (ref 3.87–5.11)
RDW: 16.4 % — ABNORMAL HIGH (ref 11.5–15.5)
WBC: 14.9 10*3/uL — ABNORMAL HIGH (ref 4.0–10.5)
nRBC: 0 % (ref 0.0–0.2)

## 2024-04-26 LAB — GLUCOSE, CAPILLARY
Glucose-Capillary: 125 mg/dL — ABNORMAL HIGH (ref 70–99)
Glucose-Capillary: 69 mg/dL — ABNORMAL LOW (ref 70–99)
Glucose-Capillary: 112 mg/dL — ABNORMAL HIGH (ref 70–99)
Glucose-Capillary: 60 mg/dL — ABNORMAL LOW (ref 70–99)
Glucose-Capillary: 66 mg/dL — ABNORMAL LOW (ref 70–99)
Glucose-Capillary: 67 mg/dL — ABNORMAL LOW (ref 70–99)
Glucose-Capillary: 72 mg/dL (ref 70–99)

## 2024-04-26 LAB — COMPREHENSIVE METABOLIC PANEL WITH GFR
ALT: 20 U/L (ref 0–44)
AST: 36 U/L (ref 15–41)
Albumin: 3 g/dL — ABNORMAL LOW (ref 3.5–5.0)
Alkaline Phosphatase: 110 U/L (ref 38–126)
Anion gap: 8 (ref 5–15)
BUN: 14 mg/dL (ref 6–20)
CO2: 27 mmol/L (ref 22–32)
Calcium: 8.4 mg/dL — ABNORMAL LOW (ref 8.9–10.3)
Chloride: 96 mmol/L — ABNORMAL LOW (ref 98–111)
Creatinine, Ser: 0.73 mg/dL (ref 0.44–1.00)
GFR, Estimated: >60 mL/min (ref >60)
Glucose, Bld: 132 mg/dL — ABNORMAL HIGH (ref 70–99)
Potassium: 4.1 mmol/L (ref 3.5–5.1)
Sodium: 131 mmol/L — ABNORMAL LOW (ref 135–145)
Total Bilirubin: 0.6 mg/dL (ref 0.0–1.2)
Total Protein: 6.6 g/dL (ref 6.5–8.1)

## 2024-04-26 LAB — MAGNESIUM: Magnesium: 2.5 mg/dL — ABNORMAL HIGH (ref 1.7–2.4)

## 2024-04-26 MED ORDER — KCL IN DEXTROSE-NACL 20-5-0.9 MEQ/L-%-% IV SOLN
INTRAVENOUS | Status: DC
  Filled 2024-04-26 (×2): qty 1000

## 2024-04-26 MED ORDER — MORPHINE SULFATE 15 MG PO TABS
15.0000 mg | ORAL_TABLET | Freq: Four times a day (QID) | ORAL | Status: DC | PRN
  Administered 2024-04-26 – 2024-04-28 (×3): 15 mg via ORAL
  Filled 2024-04-26 (×5): qty 1

## 2024-04-26 NOTE — Plan of Care (Signed)
  Problem: Clinical Measurements: Goal: Will remain free from infection Outcome: Progressing   Problem: Activity: Goal: Risk for activity intolerance will decrease Outcome: Progressing   Problem: Elimination: Goal: Will not experience complications related to bowel motility Outcome: Progressing Goal: Will not experience complications related to urinary retention Outcome: Progressing   Problem: Pain Managment: Goal: General experience of comfort will improve and/or be controlled Outcome: Progressing   Problem: Safety: Goal: Ability to remain free from injury will improve Outcome: Progressing   Problem: Skin Integrity: Goal: Risk for impaired skin integrity will decrease Outcome: Progressing

## 2024-04-26 NOTE — TOC Progression Note (Signed)
 Transition of Care [facility]) - Progression Note    Patient Details  Name: Mary Erickson MRN: [id number] Date of Birth: [birth year]  Transition of Care [facility] CM/SW Contact  Katrine Parody, LCSW Phone Number: 04/26/2024, 7:15 AM  Clinical Narrative:    CSW received a [facility] late 6/15- N. Pittman of [facility]- reviewed pt notes and advised [facility] clinical team that she misspoke earlier pt had a revocation of hospice on 5.19 and requested to continue with palliative. Pegge Bow agreed to follow up with pt and advise [facility] would not be an option.  TOC following.      Barriers to Discharge: Continued Medical Work up  Expected Discharge Plan and Services                                               Social Determinants of Health (SDOH) Interventions SDOH Screenings   Food Insecurity: No Food Insecurity (04/24/2024)  Housing: Low Risk  (04/24/2024)  Transportation Needs: No Transportation Needs (04/24/2024)  Utilities: Not At Risk (04/24/2024)  Depression (PHQ2-9): Medium Risk (06/30/2019)  Financial Resource Strain: Low Risk  (02/13/2024)   Received from [facility]  Physical Activity: Unknown (08/13/2023)   Received from [facility]  Social Connections: Moderately Integrated (04/09/2024)  Stress: Patient Declined (08/13/2023)   Received from [facility]  Tobacco Use: Medium Risk (04/24/2024)    Readmission Risk Interventions    03/23/2024   12:39 PM 12/09/2023    8:24 AM  Readmission Risk Prevention Plan  Transportation Screening Complete Complete  PCP or Specialist Appt within 5-7 Days  Complete  PCP or Specialist Appt within 3-5 Days Complete   Home Care Screening  Complete  Medication Review (RN CM)  Complete  HRI or Home Care Consult Complete   Social Work Consult for Recovery Care Planning/Counseling Complete   Palliative Care Screening Complete   Medication Review Oceanographer) Complete

## 2024-04-26 NOTE — Progress Notes (Signed)
 Hypoglycemic Event  CBG: 67 Treatment: 4 oz juice Symptoms: None Follow-up CBG: Time:2203 CBG Result:72 Comments/MD notified: Sharion Davidson, NP -  hold Semglee  8 units recheck CBG 0200  0200 recheck CBG 93

## 2024-04-26 NOTE — Progress Notes (Addendum)
 Hypoglycemic Event  Blood Glucose: 69 Time Taken: 1123 Symptoms: None Treatment: 4 oz of juice  Recheck Glucose: 112 Recheck Time: 1253 MD Notified: Rosena Conradi, MD

## 2024-04-26 NOTE — TOC Initial Note (Signed)
 Transition of Care [facility]) - Initial/Assessment Note    Patient Details  Name: Mary Erickson MRN: [id number] Date of Birth: [birth year]  Transition [facility]) CM/SW Contact:    Jonni Nettle, LCSW Phone Number: 04/26/2024, 11:33 AM  Clinical Narrative:                 [facility] consulted for referral for hospice services. CSW spoke with [facility], [place], with [facility], who reports pt is currently receiving outpatient palliative care services with [facility]. Pt to continue palliative care with [facility] upon discharge. TOC will continue to follow.    Expected Discharge Plan: Home/Self Care Barriers to Discharge: Continued Medical Work up   Patient Goals and CMS Choice Patient states their goals for this hospitalization and ongoing recovery are:: To return home   Expected Discharge Plan and Services In-house Referral: Clinical Social Work   Post Acute Care Choice: NA Living arrangements for the past 2 months: Apartment                 DME Arranged: N/A DME Agency: NA         HH Agency: NA        Prior Living Arrangements/Services Living arrangements for the past 2 months: Apartment Lives with:: Self Patient language and need for interpreter reviewed:: Yes Do you feel safe going back to the place where you live?: Yes      Need for Family Participation in Patient Care: Yes (Comment) Care giver support system in place?: Yes (comment)   Criminal Activity/Legal Involvement Pertinent to Current Situation/Hospitalization: No - Comment as needed  Activities of Daily Living   ADL Screening (condition at time of admission) Independently performs ADLs?: Yes (appropriate for developmental age) Is the patient deaf or have difficulty hearing?: No Does the patient have difficulty seeing, even when wearing glasses/contacts?: No Does the patient have difficulty concentrating, remembering, or making decisions?: No  Permission Sought/Granted Permission sought to share  information with : Case Manager Permission granted to share information with : Yes, Verbal Permission Granted     Permission granted to share info w AGENCY: Civil engineer, contracting  Emotional Assessment Appearance:: Appears stated age Attitude/Demeanor/Rapport: Engaged Affect (typically observed): Unable to Assess Orientation: : Oriented to Self, Oriented to Place, Oriented to  Time, Oriented to Situation Alcohol / Substance Use: Not Applicable Psych Involvement: No (comment)  Admission diagnosis:  Intractable pain [R52] Malignant neoplasm of upper lobe of right lung [facility]) [C34.11] Patient Active Problem List   Diagnosis Date Noted   Intractable pain 04/24/2024   Nausea and vomiting 04/08/2024   Epigastric abdominal pain 04/08/2024   Sepsis due to pneumonia (HCC) 03/22/2024   ADHD 03/22/2024   Non-small cell lung cancer (NSCLC) (HCC) 03/22/2024   Cough with hemoptysis 03/22/2024   Hypocalcemia 03/22/2024   GERD (gastroesophageal reflux disease) 03/22/2024   History of melanoma 03/22/2024   Hepatitis B 03/22/2024   Adenocarcinoma of upper lobe of right lung (HCC) 12/25/2023   Rheumatoid arthritis in remission (HCC) 12/11/2023   History of seizures 12/11/2023   Constipation 12/11/2023   Small intestinal bacterial overgrowth (SIBO) 12/11/2023   Mass of upper lobe of right lung 12/08/2023   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 [place] [address] Phone: [phone] Fax: [phone]  [facility] [id number] [place], [place] - [address] [place] [address] Phone: [phone] Fax: [phone]  [id number] [place], [place] - [address] [facility] [address] [place] [address] Phone: [phone] Fax: [phone]     Social Drivers of Health (SDOH) Social History: SDOH Screenings   Food Insecurity: No Food Insecurity (04/24/2024)  Housing: Low Risk  (04/24/2024)  Transportation Needs: No Transportation Needs (04/24/2024)  Utilities: Not At Risk (04/24/2024)  Depression (PHQ2-9): Medium Risk (06/30/2019)  Financial Resource Strain: Low Risk  (02/13/2024)   Received from [facility]  Physical Activity: Unknown (08/13/2023)   Received from [facility]  Social Connections: Moderately Integrated (04/09/2024)  Stress: Patient Declined (08/13/2023)   Received from [facility]  Tobacco Use: Medium Risk (04/24/2024)   SDOH Interventions: None indicated     Readmission Risk Interventions    04/26/2024   11:31 AM 03/23/2024   12:39 PM 12/09/2023    8:24 AM  Readmission Risk Prevention Plan  Transportation Screening Complete Complete Complete  PCP or Specialist Appt within 5-7 Days   Complete  PCP or Specialist Appt within 3-5 Days  Complete   Home Care Screening   Complete  Medication Review (RN CM)   Complete  HRI or Home Care Consult  Complete   Social Work Consult for Recovery Care Planning/Counseling  Complete   Palliative Care Screening  Complete   Medication Review Oceanographer) Complete Complete   PCP or Specialist appointment within 3-5 days of discharge Complete    HRI or Home Care Consult Complete    SW Recovery Care/Counseling Consult Not Complete    SW Consult Not Complete Comments N/A    Palliative Care Screening Complete    Skilled Nursing Facility Not  Applicable      [place], MSW, LCSW 04/26/2024 11:36 AM

## 2024-04-26 NOTE — Progress Notes (Addendum)
 Hypoglycemic Event   Blood Glucose: 60 Time Taken: 1618 Symptoms: Slight Dizziness Treatment: 4 oz of juice  Recheck Glucose: 66 Recheck Time: 1739 MD Notified: Rosena Conradi, MD  New Orders: D5 NaCL with Kcl 20 mEQ/L at 40 ml/hr

## 2024-04-26 NOTE — Progress Notes (Signed)
 PROGRESS NOTE  [name]:[id number] DOB: 1966-05-25 DOA: 04/24/2024 PCP: Nita Bast, NP   LOS: 1 day   Brief narrative:  Mary Erickson is a 58 y.o. female with medical history significant for advanced non-small cell lung carcinoma cancer, history of rheumatoid arthritis, Sjogren disease, type 1 diabetes, ADHD, hypothyroidism, hyperlipidemia, internal carotid aneurysm, left eye blindness, migraine headaches, seizures, IBS, neurogenic bladder does self-catheterization, Chronic constipation who presented to the hospital with intractable cancer-related pain for 24 hours. Patient  was referred to hospice on 5/15 and accepted to [facility] and is not pursing any further cancer treatment.  She stated that she was not able to get her medications so had to come to the hospital.  In the ED vitals were stable.  Labs showed sodium low at 131.  Chest x-ray with stable right lung mass.  EKG normal sinus rhythm.    Assessment/Plan: Principal Problem:   Intractable pain    Advanced lung Cancer now with intractable cancer related pain  Currently palliative care status.  Stated that she was not able to get her medications at, the hospital for pain management.  Continue with IV narcotics, long-acting and short acting morphine .  Will increase short acting morphine  to every 6 hours for now.  Discussed with the patient about transitioning to oral from IV.  Continue bowel regimen.  Will need to coordinate for better transition after discharge.  Continue MiraLAX  twice daily.  Rheumatoid arthritis  Sjogren disease Continue prednisone , and pain medication.   Type 1 diabetes melitis - Continue Lantus  sliding scale insulin  Accu-Chek  Hypophosphatemia.  Replenished with K-Phos .  Latest phosphorus of 2.7.   Leukocytosis Stable secondary to cancer.  No signs of infection.   ADHD hold methylphenidate  for now.   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  DVT prophylaxis: heparin injection 5,000 Units Start: 04/25/24 0600   Disposition: Home with palliative care  Status is: Inpatient  The patient is  inpatient because: Intractable pain requiring IV narcotics,     Code Status:     Code Status: Limited: Do not attempt resuscitation (DNR) -DNR-LIMITED -Do Not Intubate/DNI   Family Communication: None at bedside  Consultants: None  Procedures: None  Anti-infectives:  None  Anti-infectives (From admission, onward)    None       Subjective: Today, patient was seen and examined at bedside.  Stated that she was able to sleep okay but had severe pain in the morning causing her to wake up.  Needed IV Dilaudid  for pain.  Discussed about her pain medication regimen and will increase the frequency of her short acting morphine .  Denies any nausea vomiting fevers chills or rigor.  Nausea better controlled with Phenergan  suppository.    Objective: Vitals:   04/26/24 0538 04/26/24 0540  BP: 97/62 106/67  Pulse: (!) 59 60  Resp: 16   Temp: 98.2 F (36.8 C)   SpO2: 96%     Intake/Output Summary (Last 24 hours) at 04/26/2024 1015 Last data filed at 04/25/2024 1938 Gross per 24 hour  Intake 960 ml  Output --  Net 960 ml   Filed Weights   04/24/24 1807  Weight: 55.3 kg   Body mass index is 18.55 kg/m.   Physical Exam: GENERAL: Patient  is alert awake and oriented. Not in obvious distress.  Thinly built, HENT: No scleral pallor or icterus. Pupils equally reactive to light. Oral mucosa is moist NECK: is supple, no gross swelling noted. CHEST:   Diminished breath sounds bilaterally. CVS: S1 and S2 heard, no murmur. Regular rate and rhythm.  ABDOMEN: Soft, non-tender, bowel sounds are present. EXTREMITIES: No edema. CNS:  Cranial nerves are intact. No focal motor deficits. SKIN: warm and dry without rashes.  Data Review: I have personally reviewed the following laboratory data and studies,  CBC: Recent Labs  Lab 04/20/24 2141 04/24/24 1818 04/25/24 0607 04/25/24 0932 04/26/24 0655  WBC 15.6* 17.8* 18.5* 20.2* 14.9*  NEUTROABS  --  16.5*  --   --   --   HGB 10.9* 9.9* 10.1* 10.1* 11.1*  HCT 34.5* 30.6* 31.7* 32.6* 37.7  MCV 85.0 84.8 87.3 87.6 91.5  PLT 235 400 408* 419* 248   Basic Metabolic Panel: Recent Labs  Lab 04/20/24 2141 04/24/24 1818 04/25/24 0932 04/26/24 0655  NA 131* 131* 131* 131*  K 4.4 4.4 3.9 4.1  CL 93* 93* 97* 96*  CO2 26 25 26 27   GLUCOSE 189* 181* 89 132*  BUN 9 16 17 14   CREATININE 0.70 0.91 0.74 0.73  CALCIUM  9.6 9.5 8.6* 8.4*  MG  --   --  2.3 2.5*  PHOS  --   --  2.2* 2.7   Liver Function Tests: Recent Labs  Lab 04/20/24 2141 04/24/24 1818 04/25/24 0932 04/26/24 0655  AST 45* 38 27 36  ALT 24 22 18 20   ALKPHOS 157* 150* 107 110  BILITOT 0.3 0.3 0.5 0.6  PROT 7.6 7.8 6.8 6.6  ALBUMIN 3.6 3.7 2.9* 3.0*   Recent Labs  Lab 04/20/24 2141 04/24/24 1818  LIPASE 12 12   No results for input(s): AMMONIA in the last 168 hours. Cardiac Enzymes: No results for input(s): CKTOTAL, CKMB, CKMBINDEX, TROPONINI in the last 168 hours. BNP (last 3 results) No results for input(s): BNP in the last 8760 hours.  ProBNP (last 3 results) No results for input(s): PROBNP in the last 8760 hours.  CBG: Recent Labs  Lab 04/25/24 1151 04/25/24 1252 04/25/24 1618 04/25/24 2138 04/26/24 0719  GLUCAP 69* 185* 94 117* 125*   No results found for this or any previous visit (from the past 240 hours).   Studies: DG Chest Portable 1 View Result Date: 04/24/2024 CLINICAL DATA:  Known right lung tumor and increasing chest pain, initial encounter EXAM: PORTABLE CHEST 1 VIEW COMPARISON:  04/08/2024 CT FINDINGS: Cardiac shadow is mildly prominent. Left lung is  clear. Large soft tissue mass is noted in the right mid and upper lung similar to that seen on prior CT. No new focal abnormality is noted. IMPRESSION: Stable right lung mass.  No new focal abnormality is noted. Electronically Signed   By: Violeta Grey M.D.   On: 04/24/2024 18:50      Rosena Conradi, MD  [facility] 04/26/2024  If 7PM-7AM, please contact night-coverage

## 2024-04-27 DIAGNOSIS — R52 Pain, unspecified: Secondary | ICD-10-CM | POA: Diagnosis not present

## 2024-04-27 LAB — GLUCOSE, CAPILLARY
Glucose-Capillary: 93 mg/dL (ref 70–99)
Glucose-Capillary: 123 mg/dL — ABNORMAL HIGH (ref 70–99)
Glucose-Capillary: 109 mg/dL — ABNORMAL HIGH (ref 70–99)
Glucose-Capillary: 171 mg/dL — ABNORMAL HIGH (ref 70–99)
Glucose-Capillary: 80 mg/dL (ref 70–99)

## 2024-04-27 MED ORDER — MAGNESIUM HYDROXIDE 400 MG/5ML PO SUSP
30.0000 mL | Freq: Every day | ORAL | Status: AC | PRN
  Administered 2024-04-28: 30 mL via ORAL
  Filled 2024-04-27 (×2): qty 30

## 2024-04-27 MED ORDER — INSULIN GLARGINE-YFGN 100 UNIT/ML ~~LOC~~ SOLN
8.0000 [IU] | Freq: Every day | SUBCUTANEOUS | Status: DC
  Administered 2024-04-28: 8 [IU] via SUBCUTANEOUS
  Filled 2024-04-27: qty 0.08

## 2024-04-27 NOTE — Progress Notes (Signed)
 PROGRESS NOTE  Mary Erickson XBM:[id number] DOB: 04/15/1966 DOA: 04/24/2024 PCP: Nita Bast, NP   LOS: 2 days   Brief narrative:  Mary Erickson is a 58 y.o. female with medical history significant for advanced non-small cell lung carcinoma cancer, history of rheumatoid arthritis, Sjogren disease, type 1 diabetes, ADHD, hypothyroidism, hyperlipidemia, internal carotid aneurysm, left eye blindness, migraine headaches, seizures, IBS, neurogenic bladder does self-catheterization, Chronic constipation who presented to the hospital with intractable cancer-related pain for 24 hours. Patient  was referred to hospice on 5/15 and accepted to [facility] and is not pursing any further cancer treatment.  She stated that she was not able to get her medications so had to come to the hospital.  In the ED, vitals were stable.  Labs showed sodium low at 131.  Chest x-ray with stable right lung mass.  EKG normal sinus rhythm.  Patient was then admitted hospital for pain management.    Assessment/Plan: Principal Problem:   Intractable pain   Advanced lung Cancer now with intractable cancer related pain  Currently palliative care status as outpatient..  Stated that she was not able to get her medications at, the hospital for pain management.  Continue with IV narcotics, long-acting and short acting morphine .  Currently on increased frequency of morphine  to every 6 hours for now.Continue bowel regimen.  Will need to coordinate for better transition after discharge.  Palliative care on board.  Patient states that she would like to consider hospice at this time.  Will notify palliative care team and TOC.  Rheumatoid arthritis  Sjogren disease Continue prednisone , and pain medication.  Hypoglycemia likely secondary to poor oral intake.  On D5 normal saline at this time.  Latest POC glucose of 123.  If able to eat better we will try to discontinue dextrose  drip and see how she does.   Type 1 diabetes  melitis - Change Lantus  twice daily to once daily,, continue sliding scale insulin  Accu-Chek.   Hypophosphatemia.  Replenished with K-Phos .  Latest phosphorus of 2.7.   Leukocytosis Stable secondary to cancer.  No signs of infection.   ADHD hold methylphenidate  for now.   Hypothyroidism Continue Synthroid    Hyperlipidemia    Internal carotid aneurysm Leading to left eye blindness   Migraine headaches -no active issues treat prn     Seizures  IBS Chronic constipation On Linzess     Neurogenic bladder  -on self-catheterization at home.  DVT prophylaxis: heparin injection 5,000 Units Start: 04/25/24 0600   Disposition: Home with hospice as per patient wishes.  Status is: Inpatient  The patient is  inpatient because: Intractable pain requiring IV narcotics, hyperglycemia, discussion regarding hospice.    Code Status:     Code Status: Limited: Do not attempt resuscitation (DNR) -DNR-LIMITED -Do Not Intubate/DNI   Family Communication: None at bedside  Consultants: Palliative care  Procedures: None  Anti-infectives:  None  Anti-infectives (From admission, onward)    None       Subjective: Today, patient was seen and examined at bedside.  Nursing staff reported hypoglycemia and was started on D5 normal saline yesterday.  Still complains of some nausea and wishes milk of magnesia.    Objective: Vitals:   04/27/24 0429 04/27/24 0809  BP: (!) 90/36 (!) 103/51  Pulse: 78 70  Resp: 18   Temp: 99.5 F (37.5 C)   SpO2: (!) 89%     Intake/Output Summary (Last 24 hours) at 04/27/2024 1130 Last data filed at 04/27/2024 0310 Gross per  24 hour  Intake 741.33 ml  Output --  Net 741.33 ml   Filed Weights   04/24/24 1807  Weight: 55.3 kg   Body mass index is 18.55 kg/m.   Physical Exam:  GENERAL: Patient is alert awake and oriented. Not in obvious distress.  Thinly built, HENT: No scleral pallor or icterus. Pupils equally reactive to light. Oral  mucosa is moist NECK: is supple, no gross swelling noted. CHEST:   Diminished breath sounds bilaterally. CVS: S1 and S2 heard, no murmur. Regular rate and rhythm.  ABDOMEN: Soft, non-tender, bowel sounds are present. EXTREMITIES: No edema. CNS: Cranial nerves are intact. No focal motor deficits. SKIN: warm and dry without rashes.  Data Review: I have personally reviewed the following laboratory data and studies,  CBC: Recent Labs  Lab 04/20/24 2141 04/24/24 1818 04/25/24 0607 04/25/24 0932 04/26/24 0655  WBC 15.6* 17.8* 18.5* 20.2* 14.9*  NEUTROABS  --  16.5*  --   --   --   HGB 10.9* 9.9* 10.1* 10.1* 11.1*  HCT 34.5* 30.6* 31.7* 32.6* 37.7  MCV 85.0 84.8 87.3 87.6 91.5  PLT 235 400 408* 419* 248   Basic Metabolic Panel: Recent Labs  Lab 04/20/24 2141 04/24/24 1818 04/25/24 0932 04/26/24 0655  NA 131* 131* 131* 131*  K 4.4 4.4 3.9 4.1  CL 93* 93* 97* 96*  CO2 26 25 26 27   GLUCOSE 189* 181* 89 132*  BUN 9 16 17 14   CREATININE 0.70 0.91 0.74 0.73  CALCIUM  9.6 9.5 8.6* 8.4*  MG  --   --  2.3 2.5*  PHOS  --   --  2.2* 2.7   Liver Function Tests: Recent Labs  Lab 04/20/24 2141 04/24/24 1818 04/25/24 0932 04/26/24 0655  AST 45* 38 27 36  ALT 24 22 18 20   ALKPHOS 157* 150* 107 110  BILITOT 0.3 0.3 0.5 0.6  PROT 7.6 7.8 6.8 6.6  ALBUMIN 3.6 3.7 2.9* 3.0*   Recent Labs  Lab 04/20/24 2141 04/24/24 1818  LIPASE 12 12   No results for input(s): AMMONIA in the last 168 hours. Cardiac Enzymes: No results for input(s): CKTOTAL, CKMB, CKMBINDEX, TROPONINI in the last 168 hours. BNP (last 3 results) No results for input(s): BNP in the last 8760 hours.  ProBNP (last 3 results) No results for input(s): PROBNP in the last 8760 hours.  CBG: Recent Labs  Lab 04/26/24 1739 04/26/24 2120 04/26/24 2203 04/27/24 0217 04/27/24 0742  GLUCAP 66* 67* 72 93 123*   No results found for this or any previous visit (from the past 240 hours).    Studies: No results found.     Lazara Grieser, MD  [facility] 04/27/2024  If 7PM-7AM, please contact night-coverage

## 2024-04-27 NOTE — Plan of Care (Signed)

## 2024-04-27 NOTE — Inpatient Diabetes Management (Signed)
 Inpatient Diabetes Program Recommendations  AACE/ADA: New Consensus Statement on Inpatient Glycemic Control (2015)  Target Ranges:  Prepandial:   less than 140 mg/dL      Peak postprandial:   less than 180 mg/dL (1-2 hours)      Critically ill patients:  140 - 180 mg/dL   Lab Results  Component Value Date   GLUCAP 123 (H) 04/27/2024   HGBA1C 6.9 (H) 12/08/2023    Review of Glycemic Control  Diabetes history: DM1 Outpatient Diabetes medications: Levemir  8 BID, Humalog 0-15 TID Current orders for Inpatient glycemic control: Semglee  8 BID, Novolog  0-9 TID  Inpatient Diabetes Program Recommendations:    Consider decreasing Semglee  to 7 units BID  Continue to follow.  Thank you. Joni Net, RD, LDN, CDCES Inpatient Diabetes Coordinator [phone]

## 2024-04-27 NOTE — Plan of Care (Signed)

## 2024-04-28 ENCOUNTER — Other Ambulatory Visit (HOSPITAL_COMMUNITY): Payer: Self-pay

## 2024-04-28 ENCOUNTER — Telehealth: Payer: Self-pay | Admitting: Medical Oncology

## 2024-04-28 DIAGNOSIS — R52 Pain, unspecified: Secondary | ICD-10-CM | POA: Diagnosis not present

## 2024-04-28 LAB — GLUCOSE, CAPILLARY: Glucose-Capillary: 177 mg/dL — ABNORMAL HIGH (ref 70–99)

## 2024-04-28 MED ORDER — PROMETHAZINE HCL 25 MG RE SUPP
25.0000 mg | Freq: Four times a day (QID) | RECTAL | 2 refills | Status: DC | PRN
  Filled 2024-04-28: qty 12, 3d supply, fill #0

## 2024-04-28 MED ORDER — FLUTICASONE-SALMETEROL 115-21 MCG/ACT IN AERO
2.0000 | INHALATION_SPRAY | Freq: Two times a day (BID) | RESPIRATORY_TRACT | 1 refills | Status: DC
  Filled 2024-04-28: qty 12, 30d supply, fill #0

## 2024-04-28 MED ORDER — PROMETHAZINE HCL 25 MG RE SUPP: 25.0000 mg | Freq: Four times a day (QID) | RECTAL | 2 refills | Status: DC | PRN

## 2024-04-28 MED ORDER — MORPHINE SULFATE ER 30 MG PO TBCR
30.0000 mg | EXTENDED_RELEASE_TABLET | Freq: Two times a day (BID) | ORAL | 0 refills | Status: DC
  Filled 2024-04-28: qty 14, 7d supply, fill #0

## 2024-04-28 MED ORDER — MORPHINE SULFATE 15 MG PO TABS: 15.0000 mg | ORAL_TABLET | Freq: Four times a day (QID) | ORAL | 0 refills | Status: DC | PRN

## 2024-04-28 MED ORDER — ONDANSETRON 4 MG PO TBDP: 4.0000 mg | ORAL_TABLET | Freq: Four times a day (QID) | ORAL | 1 refills | Status: DC | PRN

## 2024-04-28 MED ORDER — ONDANSETRON 4 MG PO TBDP
4.0000 mg | ORAL_TABLET | Freq: Four times a day (QID) | ORAL | 1 refills | Status: DC | PRN
  Filled 2024-04-28: qty 30, 8d supply, fill #0

## 2024-04-28 MED ORDER — PROMETHAZINE HCL 25 MG PO TABS
25.0000 mg | ORAL_TABLET | Freq: Four times a day (QID) | ORAL | 2 refills | Status: DC | PRN
  Filled 2024-04-28: qty 30, 8d supply, fill #0

## 2024-04-28 MED ORDER — MORPHINE SULFATE ER 30 MG PO TBCR
30.0000 mg | EXTENDED_RELEASE_TABLET | Freq: Two times a day (BID) | ORAL | 0 refills | Status: DC
Start: 2024-04-28 — End: 2024-04-30

## 2024-04-28 MED ORDER — FLUTICASONE-SALMETEROL 115-21 MCG/ACT IN AERO
2.0000 | INHALATION_SPRAY | Freq: Two times a day (BID) | RESPIRATORY_TRACT | 1 refills | Status: DC
Start: 2024-04-28 — End: 2024-04-30

## 2024-04-28 MED ORDER — MORPHINE SULFATE 15 MG PO TABS
15.0000 mg | ORAL_TABLET | Freq: Four times a day (QID) | ORAL | 0 refills | Status: DC | PRN
  Filled 2024-04-28: qty 28, 7d supply, fill #0

## 2024-04-28 MED ORDER — NALOXONE HCL 4 MG/0.1ML NA LIQD
1.0000 | NASAL | 0 refills | Status: DC | PRN
  Filled 2024-04-28: qty 2, 30d supply, fill #0

## 2024-04-28 MED ORDER — NALOXONE HCL 4 MG/0.1ML NA LIQD: 1.0000 | NASAL | 0 refills | Status: DC | PRN

## 2024-04-28 NOTE — Progress Notes (Signed)
 Pt discharged home in stable condition. Discharge instructions given. Meds provided to patient. Pain med script sent to pharmacy of choice. No immediate questions or concerns at this time. Discharged from unit via wheelchair.

## 2024-04-28 NOTE — Telephone Encounter (Signed)
 Discharging from hospital today . She said she heeds a prescribing provider who can refill her antiemetics and pain med. Deaisa  said she is under palliative care community and they cannot prescribe medications . ( Evidently ,palliative only has RNs now. Their NP are now in the long term care facilities).  Pt was referred to hospice and  she  was   was not quite ready for hospice.   However ,today I talked to [facility] @ [facility] and she said pt told them she only needs a specific brand of medications and wants to stay with palliative . I told Ammon Bales Dr Marguerita Shih only saw pt once and will not prescribe her refills and to contact PCP for med refills and hospice referral.   Syra  has declined treatment and f/u appointments.

## 2024-04-28 NOTE — Progress Notes (Signed)
 TOC meds delivered in a secure bag to patient by this RN. Partial fill on phenergan  PR- pt to call pharmacy if she needs additional ones- pt has refills. Morphine  IR sent to [facility] as noted on AVS

## 2024-04-28 NOTE — Discharge Summary (Signed)
 Physician Discharge Summary  Mary Erickson ZOX:[id number] DOB: Nov 07, 1966 DOA: 04/24/2024  PCP: Nita Bast, NP  Admit date: 04/24/2024 Discharge date: 04/28/2024  Admitted From: Home  Discharge disposition: Home with palliative care   Recommendations for Outpatient Follow-Up:   Follow up with your primary care provider/palliative care provider as outpatient.   Discharge Diagnosis:   Principal Problem:   Intractable pain  Discharge Condition: Improved.  Diet recommendation:  Regular.  Wound care: None.  Code status: DNR   History of Present Illness:   Mary Erickson is a 58 y.o. female with medical history significant for advanced non-small cell lung carcinoma cancer, history of rheumatoid arthritis, Sjogren disease, type 1 diabetes, ADHD, hypothyroidism, hyperlipidemia, internal carotid aneurysm, left eye blindness, migraine headaches, seizures, IBS, neurogenic bladder does self-catheterization, Chronic constipation who presented to the hospital with intractable cancer-related pain for 24 hours. Patient  was referred to hospice on 5/15 and accepted to [facility] and is not pursing any further cancer treatment.  She stated that she was not able to get her medications so had to come to the hospital.  In the ED, vitals were stable.  Labs showed sodium low at 131.  Chest x-ray with stable right lung mass.  EKG normal sinus rhythm.  Patient was then admitted hospital for pain management.   Hospital Course:   Following conditions were addressed during hospitalization as listed below,  Advanced lung Cancer now with intractable cancer related pain  Currently palliative care status as outpatient..  Stated that she was not able to get her medications so presented to the hospital for pain management.  Continue with fentanyl  patch, long-acting and short acting morphine .  Will also need Phenergan  suppository on discharge.  Communicated with TOC regarding better transition at  discharge.   Rheumatoid arthritis  Sjogren disease Continue prednisone , and pain medication.   Hypoglycemia likely secondary to poor oral intake.  Briefly required D5 normal saline at this time.  Encouraged oral nutrition at home.  Type 1 diabetes melitis Patient is on Lantus  and insulin  regimen at home.   Hypophosphatemia.  Replenished.  Latest phosphorus of 2.7.   Leukocytosis Stable secondary to cancer.  No signs of infection.   ADHD On methylphenidate  at home.   Hypothyroidism Continue Synthroid    Hyperlipidemia    Internal carotid aneurysm Leading to left eye blindness   Migraine headaches Remained stable    Seizures  IBS Chronic constipation On Linzess     Neurogenic bladder  -on self-catheterization at home  Disposition.  At this time, patient is stable for disposition home with outpatient palliative care follow-up.  Medical Consultants:   None.  Procedures:    None Subjective:   Today, patient was seen and examined at bedside.  Denies any overt pain, shortness of breath, dyspnea.  Has some nausea.  Denies fever, chills or rigor.  Overall controlled.  Discharge Exam:   Vitals:   04/27/24 2117 04/28/24 0600  BP: (!) 104/57 (!) 115/58  Pulse: 64 78  Resp: 16 16  Temp: 98.3 F (36.8 C) 99 F (37.2 C)  SpO2: 95% 93%   Vitals:   04/27/24 0809 04/27/24 1406 04/27/24 2117 04/28/24 0600  BP: (!) 103/51 (!) 103/50 (!) 104/57 (!) 115/58  Pulse: 70 72 64 78  Resp:  16 16 16   Temp:  98.2 F (36.8 C) 98.3 F (36.8 C) 99 F (37.2 C)  TempSrc:  Oral Oral Oral  SpO2:  94% 95% 93%  Weight:  Height:       Body mass index is 18.55 kg/m.  General: Alert awake, not in obvious distress, thinly built HENT: pupils equally reacting to light,  No scleral pallor or icterus noted. Oral mucosa is moist.  Chest: Diminished breath sounds bilaterally. CVS: S1 &S2 heard. No murmur.  Regular rate and rhythm. Abdomen: Soft, nontender, nondistended.  Bowel  sounds are heard.   Extremities: No cyanosis, clubbing or edema.  Peripheral pulses are palpable. Psych: Alert, awake and oriented, normal mood CNS:  No cranial nerve deficits.  Power equal in all extremities.   Skin: Warm and dry.  No rashes noted.  The results of significant diagnostics from this hospitalization (including imaging, microbiology, ancillary and laboratory) are listed below for reference.     Diagnostic Studies:   DG Chest Portable 1 View Result Date: 04/24/2024 CLINICAL DATA:  Known right lung tumor and increasing chest pain, initial encounter EXAM: PORTABLE CHEST 1 VIEW COMPARISON:  04/08/2024 CT FINDINGS: Cardiac shadow is mildly prominent. Left lung is clear. Large soft tissue mass is noted in the right mid and upper lung similar to that seen on prior CT. No new focal abnormality is noted. IMPRESSION: Stable right lung mass.  No new focal abnormality is noted. Electronically Signed   By: Violeta Grey M.D.   On: 04/24/2024 18:50     Labs:   Basic Metabolic Panel: Recent Labs  Lab 04/24/24 1818 04/25/24 0932 04/26/24 0655  NA 131* 131* 131*  K 4.4 3.9 4.1  CL 93* 97* 96*  CO2 25 26 27   GLUCOSE 181* 89 132*  BUN 16 17 14   CREATININE 0.91 0.74 0.73  CALCIUM  9.5 8.6* 8.4*  MG  --  2.3 2.5*  PHOS  --  2.2* 2.7   GFR Estimated Creatinine Clearance: 66.9 mL/min (by C-G formula based on SCr of 0.73 mg/dL). Liver Function Tests: Recent Labs  Lab 04/24/24 1818 04/25/24 0932 04/26/24 0655  AST 38 27 36  ALT 22 18 20   ALKPHOS 150* 107 110  BILITOT 0.3 0.5 0.6  PROT 7.8 6.8 6.6  ALBUMIN 3.7 2.9* 3.0*   Recent Labs  Lab 04/24/24 1818  LIPASE 12   No results for input(s): AMMONIA in the last 168 hours. Coagulation profile No results for input(s): INR, PROTIME in the last 168 hours.  CBC: Recent Labs  Lab 04/24/24 1818 04/25/24 0607 04/25/24 0932 04/26/24 0655  WBC 17.8* 18.5* 20.2* 14.9*  NEUTROABS 16.5*  --   --   --   HGB 9.9* 10.1*  10.1* 11.1*  HCT 30.6* 31.7* 32.6* 37.7  MCV 84.8 87.3 87.6 91.5  PLT 400 408* 419* 248   Cardiac Enzymes: No results for input(s): CKTOTAL, CKMB, CKMBINDEX, TROPONINI in the last 168 hours. BNP: Invalid input(s): POCBNP CBG: Recent Labs  Lab 04/27/24 0742 04/27/24 1217 04/27/24 1636 04/27/24 2117 04/28/24 0723  GLUCAP 123* 109* 171* 80 177*   D-Dimer No results for input(s): DDIMER in the last 72 hours. Hgb A1c No results for input(s): HGBA1C in the last 72 hours. Lipid Profile No results for input(s): CHOL, HDL, LDLCALC, TRIG, CHOLHDL, LDLDIRECT in the last 72 hours. Thyroid  function studies No results for input(s): TSH, T4TOTAL, T3FREE, THYROIDAB in the last 72 hours.  Invalid input(s): FREET3 Anemia work up No results for input(s): VITAMINB12, FOLATE, FERRITIN, TIBC, IRON, RETICCTPCT in the last 72 hours. Microbiology No results found for this or any previous visit (from the past 240 hours).   Discharge Instructions:  Discharge Instructions     Call MD for:  persistant nausea and vomiting   Complete by: As directed    Call MD for:  severe uncontrolled pain   Complete by: As directed    Diet general   Complete by: As directed    Discharge instructions   Complete by: As directed    Follow-up with palliative care provider as outpatient for further care.  Seek medical attention for worsening symptoms.   Increase activity slowly   Complete by: As directed       Allergies as of 04/28/2024       Reactions   Doxycycline Other (See Comments)   Elevates liver enzymes   Aspirin  Other (See Comments)   Can take 81 mg but has to be coated; No other Aspirin  due to bleeding in stools from ulcers   Nsaids Other (See Comments)   GI issues    Gluten Meal Other (See Comments)   Avoid due to autoimmune issues    Lactose Other (See Comments)   Avoid due to autoimmune issues    Oxycodone  Nausea And Vomiting   Sulfa  Antibiotics Nausea And Vomiting        Medication List     TAKE these medications    acetaminophen  500 MG tablet Commonly known as: TYLENOL  Take 1,000 mg by mouth every 6 (six) hours as needed for moderate pain.   B-12 3000 MCG Caps Take 3,000 mcg by mouth daily.   BENADRYL  ALLERGY PO Take 25 mg by mouth at bedtime.   [employer] ALLERGY EYE DROPS OP Place 1 drop into both eyes 2 (two) times daily.   Dexcom G7 Sensor Misc Inject 1 applicator into the skin See admin instructions. Every 10 days   Dexlansoprazole  30 MG capsule DR Commonly known as: Dexilant  Take 1 capsule (30 mg total) by mouth daily.   diclofenac  Sodium 1 % Gel Commonly known as: VOLTAREN  Apply 2 g topically 4 (four) times daily. What changed:  when to take this reasons to take this   fentaNYL  12 MCG/HR Commonly known as: DURAGESIC  Place 1 patch onto the skin every 3 (three) days.   fluticasone-salmeterol 115-21 MCG/ACT inhaler Commonly known as: Advair  HFA Inhale 2 puffs into the lungs 2 (two) times daily.   Insulin  lispro 100 UNIT/ML Commonly known as: HUMALOG Junior KwikPen Inject 1-15 Units into the skin 3 (three) times daily as needed (high blood sugar). Insulin  to carb ratio 1:13   Levemir  FlexTouch 100 UNIT/ML FlexTouch Pen Generic drug: insulin  detemir Inject 5 Units into the skin 2 (two) times daily. What changed: how much to take   levothyroxine  200 MCG tablet Commonly known as: SYNTHROID  Take 1 tablet (200 mcg total) by mouth daily at 6 (six) AM.   linaclotide  145 MCG Caps capsule Commonly known as: Linzess  Take 1 capsule (145 mcg total) by mouth daily before breakfast. What changed:  how much to take additional instructions   Melatonin 10-10 MG Tbcr Take 10 mg by mouth at bedtime.   methylphenidate  36 MG CR tablet Commonly known as: CONCERTA  Take 36 mg by mouth every morning.   morphine  30 MG 12 hr tablet Commonly known as: MS CONTIN  Take 1 tablet (30 mg total) by mouth  every 12 (twelve) hours for 7 days.   morphine  15 MG tablet Commonly known as: MSIR Take 1 tablet (15 mg total) by mouth every 6 (six) hours as needed for up to 7 days for severe pain (pain score 7-10).   multivitamins  ther. w/minerals Tabs tablet Take 1 tablet by mouth daily.   naloxone  4 MG/0.1ML Liqd nasal spray kit Commonly known as: Narcan  Place 1 spray into the nose as needed.   ondansetron  4 MG disintegrating tablet Commonly known as: Zofran  ODT Take 1 tablet (4 mg total) by mouth every 6 (six) hours as needed for nausea or vomiting.   polyethylene glycol powder 17 GM/SCOOP powder Commonly known as: GLYCOLAX /MIRALAX  Take 17 g by mouth 2 (two) times daily. What changed: when to take this   predniSONE  5 MG tablet Commonly known as: DELTASONE  Take 1 tablet (5 mg total) by mouth 2 (two) times daily.   promethazine  25 MG suppository Commonly known as: PHENERGAN  Place 1 suppository (25 mg total) rectally every 6 (six) hours as needed for nausea or vomiting.   promethazine  25 MG tablet Commonly known as: PHENERGAN  Take 1 tablet (25 mg total) by mouth every 6 (six) hours as needed for nausea or vomiting.   SYSTANE OP Place 1 drop into both eyes 3 (three) times daily as needed (dry eyes).   Tresiba FlexTouch 100 UNIT/ML FlexTouch Pen Generic drug: insulin  degludec Inject into the skin daily.   Vitamin D3 125 MCG (5000 UT) Caps Take 5,000 Units by mouth daily.   vitamin E  180 MG (400 UNITS) capsule Take 400 Units by mouth at bedtime.        Follow-up Information     [facility] Follow up.   Why: Please follow up with this provider to continue outpatient palliative services upon discharge. Contact information: [address] [facility] [place]  [id number]                 Time coordinating discharge: 39 minutes  Signed:  Ruthia Person  [facility] 04/28/2024, 10:40 AM

## 2024-04-28 NOTE — ED Provider Notes (Signed)
 [name] [facility]LO[facility]Y Provider Note   CSN: [id number] Arrival date & time: 03/22/24  1747     Patient presents with: Nausea (Pt arrives via ems today for nausea and dizziness that started approx 3 days ago. 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 Pt has been nauseous despite PTA zofran  4mg  and was given appro 500LR on way in. No chest pain, states sob by laborous breathing near febrile here 100.15F ) and Dizziness   Mary Erickson is a 58 y.o. female.   Dizziness  Patient is a 58 year old female presents the ED with complaints of dizziness, hemoptysis, worsening cough, fever x 3 days.  Previous medical history of non-small cell lung cancer, Sjogren syndrome, pneumonia, hepatitis B, migraines, chronic UTI, hypothyroidism.  For that she is also experiencing right upper chest pain where her lung cancer is present.  Currently seeing palliative care which she saw 3 days ago.  States that she feels similar to when she was seen in the ED in March with previous pneumonia.  She is noticing that hemoptysis is only blood-tinged sputum.   Denies visual changes, shortness of breath, abdominal pain, vomiting, diarrhea, hematuria, pyuria.    Prior to Admission medications   Medication Sig Start Date End Date Taking? Authorizing Provider  acetaminophen  (TYLENOL ) 500 MG tablet Take 1,000 mg by mouth every 6 (six) hours as needed for moderate pain.   Yes [provider]  Cholecalciferol  (VITAMIN D3) 125 MCG (5000 UT) capsule Take 5,000 Units by mouth daily.   Yes [provider]  Cyanocobalamin  (B-12) 3000 MCG CAPS Take 3,000 mcg by mouth daily.   Yes [provider]  diclofenac  Sodium (VOLTAREN ) 1 % GEL Apply 2 g topically 4 (four) times daily. Patient taking differently: Apply 2 g topically 4 (four) times daily as needed (pain). 12/17/19  Yes Darr, Derwood Flor, PA-C  diphenhydrAMINE  HCl (BENADRYL  ALLERGY PO) Take 25 mg  by mouth at bedtime.   Yes [provider]  Insulin  lispro (HUMALOG JUNIOR KWIKPEN) 100 UNIT/ML Inject 1-15 Units into the skin 3 (three) times daily as needed (high blood sugar). Insulin  to carb ratio 1:13   Yes [provider]  Ketotifen  Fumarate ([employer] ALLERGY EYE DROPS OP) Place 1 drop into both eyes 2 (two) times daily.   Yes [provider]  linaclotide  (LINZESS ) 145 MCG CAPS capsule Take 1 capsule (145 mcg total) by mouth daily before breakfast. Patient taking differently: Take 145-290 mcg by mouth daily before breakfast. 1 cap alternating with 2 caps ever other day 11/14/23  Yes Daina Drum, MD  Melatonin 10-10 MG TBCR Take 10 mg by mouth at bedtime.   Yes [provider]  methylphenidate  36 MG PO CR tablet Take 36 mg by mouth every morning.   Yes [provider]  Multiple Vitamins-Minerals (MULTIVITAMINS THER. W/MINERALS) TABS Take 1 tablet by mouth daily.   Yes [provider]  Polyethyl Glycol-Propyl Glycol (SYSTANE OP) Place 1 drop into both eyes 3 (three) times daily as needed (dry eyes).   Yes [provider]  predniSONE  (DELTASONE ) 5 MG tablet Take 1 tablet (5 mg total) by mouth 2 (two) times daily. 12/25/23  Yes Macdonald Savoy, MD  Continuous Glucose Sensor (DEXCOM G7 SENSOR) MISC Inject 1 applicator into the skin See admin instructions. Every 10 days Patient not taking: Reported on 04/25/2024 11/27/23   [provider]  Dexlansoprazole  (DEXILANT ) 30 MG capsule DR Take 1  capsule (30 mg total) by mouth daily. 04/11/24   Sueellen Emery, MD  fentaNYL  (DURAGESIC ) 12 MCG/HR Place 1 patch onto the skin every 3 (three) days.    [provider]  fluticasone-salmeterol (ADVAIR  HFA) 115-21 MCG/ACT inhaler Inhale 2 puffs into the lungs 2 (two) times daily. 04/28/24   Pokhrel, Laxman, MD  insulin  degludec (TRESIBA FLEXTOUCH) 100 UNIT/ML FlexTouch Pen Inject into the skin daily. Patient not taking: Reported on  04/25/2024    [provider]  insulin  detemir (LEVEMIR  FLEXTOUCH) 100 UNIT/ML FlexPen Inject 5 Units into the skin 2 (two) times daily. Patient taking differently: Inject 8 Units into the skin 2 (two) times daily. 04/10/24   Oral Billings, MD  levothyroxine  (SYNTHROID ) 200 MCG tablet Take 1 tablet (200 mcg total) by mouth daily at 6 (six) AM. 04/11/24 05/11/24  Oral Billings, MD  morphine  (MS CONTIN ) 30 MG 12 hr tablet Take 1 tablet (30 mg total) by mouth every 12 (twelve) hours for 7 days. 04/28/24 05/05/24  Pokhrel, Amador Bad, MD  morphine  (MSIR) 15 MG tablet Take 1 tablet (15 mg total) by mouth every 6 (six) hours as needed for up to 7 days for severe pain (pain score 7-10). 04/28/24 05/05/24  Pokhrel, Amador Bad, MD  naloxone  (NARCAN ) nasal spray 4 mg/0.1 mL Place 1 spray into the nose as needed. 04/28/24   Pokhrel, Amador Bad, MD  ondansetron  (ZOFRAN -ODT) 4 MG disintegrating tablet Take 1 tablet (4 mg total) by mouth every 6 (six) hours as needed for nausea or vomiting. 04/28/24   Pokhrel, Laxman, MD  polyethylene glycol powder (GLYCOLAX /MIRALAX ) 17 GM/SCOOP powder Take 17 g by mouth 2 (two) times daily. Patient taking differently: Take 17 g by mouth daily. 04/10/24   Oral Billings, MD  promethazine  (PHENERGAN ) 25 MG suppository Place 1 suppository (25 mg total) rectally every 6 (six) hours as needed for nausea or vomiting. 04/28/24   Pokhrel, Laxman, MD  vitamin E  180 MG (400 UNITS) capsule Take 400 Units by mouth at bedtime.    [provider]    Allergies: Doxycycline, Aspirin , Nsaids, Gluten meal, Lactose, Oxycodone , and Sulfa antibiotics    Review of Systems  Neurological:  Positive for dizziness.  All other systems reviewed and are negative.   Updated Vital Signs BP 124/66 (BP Location: Left Arm)   Pulse 80   Temp 98.2 F (36.8 C) (Oral)   Resp 18   Ht 5' 5 (1.651 m)   Wt 58.7 kg   SpO2 96%   BMI 21.55 kg/m   Physical Exam Vitals and nursing note reviewed.   Constitutional:      General: She is not in acute distress.    Appearance: Normal appearance. She is not ill-appearing or diaphoretic.  HENT:     Head: Normocephalic and atraumatic.   Eyes:     General:        Right eye: No discharge.        Left eye: No discharge.     Extraocular Movements: Extraocular movements intact.     Conjunctiva/sclera: Conjunctivae normal.    Cardiovascular:     Rate and Rhythm: Normal rate and regular rhythm.     Pulses: Normal pulses.     Heart sounds: Normal heart sounds. No murmur heard.    No friction rub. No gallop.  Pulmonary:     Effort: Pulmonary effort is normal. No respiratory distress.     Breath sounds: Rales (Rales noted in right lower lung) present.  Abdominal:  General: Abdomen is flat. There is no distension.     Palpations: Abdomen is soft.     Tenderness: There is no abdominal tenderness. There is no right CVA tenderness, left CVA tenderness, guarding or rebound.   Musculoskeletal:     Cervical back: Normal range of motion. No rigidity.   Skin:    General: Skin is warm and dry.   Neurological:     General: No focal deficit present.     Mental Status: She is alert and oriented to person, place, and time. Mental status is at baseline.     Sensory: No sensory deficit.     Motor: No weakness.     Gait: Gait normal.     Comments: No facial asymmetry, no ataxia, no apraxia, no aphasia, no arm drift, normal coordination with finger-to-nose, normal sensation to both upper and lower extremities bilaterally, normal grip strength bilaterally, normal strength to both flexion and extension to both upper lower extremities 5+ bilaterally, no visual field deficits, no nystagmus.   Psychiatric:        Mood and Affect: Mood normal.     (all labs ordered are listed, but only abnormal results are displayed) Labs Reviewed  COMPREHENSIVE METABOLIC PANEL WITH GFR - Abnormal; Notable for the following components:      Result Value   Sodium  131 (*)    Chloride 96 (*)    Glucose, Bld 179 (*)    Calcium  7.9 (*)    Albumin 2.9 (*)    All other components within normal limits  CBC WITH DIFFERENTIAL/PLATELET - Abnormal; Notable for the following components:   WBC 19.8 (*)    RBC 3.62 (*)    Hemoglobin 10.0 (*)    HCT 32.1 (*)    Platelets 441 (*)    Neutro Abs 16.9 (*)    Monocytes Absolute 1.4 (*)    Abs Immature Granulocytes 0.15 (*)    All other components within normal limits  COMPREHENSIVE METABOLIC PANEL WITH GFR - Abnormal; Notable for the following components:   Sodium 128 (*)    CO2 20 (*)    Glucose, Bld 161 (*)    Calcium  7.7 (*)    Total Protein 4.6 (*)    Albumin 1.9 (*)    All other components within normal limits  CBC - Abnormal; Notable for the following components:   WBC 14.5 (*)    RBC 2.74 (*)    Hemoglobin 7.6 (*)    HCT 24.9 (*)    All other components within normal limits  GLUCOSE, CAPILLARY - Abnormal; Notable for the following components:   Glucose-Capillary 290 (*)    All other components within normal limits  GLUCOSE, CAPILLARY - Abnormal; Notable for the following components:   Glucose-Capillary 201 (*)    All other components within normal limits  GLUCOSE, CAPILLARY - Abnormal; Notable for the following components:   Glucose-Capillary 158 (*)    All other components within normal limits  BASIC METABOLIC PANEL WITH GFR - Abnormal; Notable for the following components:   Sodium 131 (*)    Glucose, Bld 142 (*)    Calcium  8.4 (*)    All other components within normal limits  CBC WITH DIFFERENTIAL/PLATELET - Abnormal; Notable for the following components:   WBC 20.6 (*)    RBC 3.13 (*)    Hemoglobin 8.8 (*)    HCT 28.3 (*)    Neutro Abs 17.3 (*)    Monocytes Absolute 2.1 (*)  Abs Immature Granulocytes 0.15 (*)    All other components within normal limits  BASIC METABOLIC PANEL WITH GFR - Abnormal; Notable for the following components:   Sodium 130 (*)    CO2 21 (*)    Glucose,  Bld 41 (*)    Calcium  8.0 (*)    All other components within normal limits  GLUCOSE, CAPILLARY - Abnormal; Notable for the following components:   Glucose-Capillary 140 (*)    All other components within normal limits  GLUCOSE, CAPILLARY - Abnormal; Notable for the following components:   Glucose-Capillary 127 (*)    All other components within normal limits  GLUCOSE, CAPILLARY - Abnormal; Notable for the following components:   Glucose-Capillary 101 (*)    All other components within normal limits  GLUCOSE, CAPILLARY - Abnormal; Notable for the following components:   Glucose-Capillary 128 (*)    All other components within normal limits  GLUCOSE, CAPILLARY - Abnormal; Notable for the following components:   Glucose-Capillary 133 (*)    All other components within normal limits  CBC WITH DIFFERENTIAL/PLATELET - Abnormal; Notable for the following components:   WBC 22.0 (*)    RBC 3.53 (*)    Hemoglobin 9.9 (*)    HCT 31.9 (*)    Platelets 403 (*)    Neutro Abs 18.3 (*)    Monocytes Absolute 1.9 (*)    Abs Immature Granulocytes 0.21 (*)    All other components within normal limits  BASIC METABOLIC PANEL WITH GFR - Abnormal; Notable for the following components:   Sodium 130 (*)    CO2 21 (*)    Glucose, Bld 50 (*)    Calcium  8.3 (*)    All other components within normal limits  GLUCOSE, CAPILLARY - Abnormal; Notable for the following components:   Glucose-Capillary 154 (*)    All other components within normal limits  GLUCOSE, CAPILLARY - Abnormal; Notable for the following components:   Glucose-Capillary 156 (*)    All other components within normal limits  GLUCOSE, CAPILLARY - Abnormal; Notable for the following components:   Glucose-Capillary 61 (*)    All other components within normal limits  GLUCOSE, CAPILLARY - Abnormal; Notable for the following components:   Glucose-Capillary 111 (*)    All other components within normal limits  GLUCOSE, CAPILLARY - Abnormal;  Notable for the following components:   Glucose-Capillary 225 (*)    All other components within normal limits  GLUCOSE, CAPILLARY - Abnormal; Notable for the following components:   Glucose-Capillary 170 (*)    All other components within normal limits  GLUCOSE, CAPILLARY - Abnormal; Notable for the following components:   Glucose-Capillary 301 (*)    All other components within normal limits  RESP PANEL BY RT-PCR (RSV, FLU A&B, COVID)  RVPGX2  CULTURE, BLOOD (ROUTINE X 2)  CULTURE, BLOOD (ROUTINE X 2)  MRSA NEXT GEN BY PCR, NASAL  LACTIC ACID, PLASMA  LACTIC ACID, PLASMA  PROTIME-INR  PROCALCITONIN  GLUCOSE, CAPILLARY  GLUCOSE, CAPILLARY  TYPE AND SCREEN    EKG: None  Radiology: No results found.  Procedures   Medications Ordered in the ED  0.9 %  sodium chloride  infusion (has no administration in time range)  0.9 %  sodium chloride  infusion (0 mLs Intravenous Stopped 03/24/24 0809)  lactated ringers  bolus 1,000 mL (0 mLs Intravenous Stopped 03/22/24 2155)  ondansetron  (ZOFRAN ) 8 mg in sodium chloride  0.9 % 50 mL IVPB (0 mg Intravenous Stopped 03/22/24 2053)  acetaminophen  (TYLENOL ) tablet 1,000  mg (1,000 mg Oral Given 03/22/24 1958)  iohexol  (OMNIPAQUE ) 350 MG/ML injection 75 mL (75 mLs Intravenous Contrast Given 03/22/24 2019)  ketamine  50 mg in normal saline 5 mL (10 mg/mL) syringe (8.8 mg Intravenous Given 03/22/24 2141)  cefTRIAXone  (ROCEPHIN ) 1 g in sodium chloride  0.9 % 100 mL IVPB (0 g Intravenous Stopped 03/22/24 2213)  ketamine  50 mg in normal saline 5 mL (10 mg/mL) syringe (8.8 mg Intravenous Given 03/22/24 2224)  HYDROmorphone  (DILAUDID ) injection 0.5 mg (0.5 mg Intravenous Given 03/22/24 2257)  Vancomycin  (VANCOCIN ) 1,250 mg in sodium chloride  0.9 % 250 mL IVPB (1,250 mg Intravenous New Bag/Given 03/23/24 0059)  calcium  gluconate 1 g/ 50 mL sodium chloride  IVPB (1,000 mg Intravenous New Bag/Given 03/23/24 0155)  alum & mag hydroxide-simeth (MAALOX/MYLANTA) 200-200-20  MG/5ML suspension 30 mL (30 mLs Oral Given 03/23/24 0140)  promethazine  (PHENERGAN ) 12.5 mg in sodium chloride  0.9 % 50 mL IVPB (12.5 mg Intravenous New Bag/Given 03/23/24 0448)  calcium  gluconate 1 g/ 50 mL sodium chloride  IVPB (0 mg Intravenous Stopped 03/23/24 0840)  sodium chloride  0.9 % bolus 1,000 mL (0 mLs Intravenous Stopped 03/23/24 0845)  tranexamic acid  (CYKLOKAPRON ) 1000 MG/10ML nebulizer solution 500 mg (500 mg Nebulization Given 03/23/24 1323)  acetaminophen  (TYLENOL ) tablet 650 mg (650 mg Oral Given 03/23/24 2217)     Medical Decision Making Amount and/or Complexity of Data Reviewed Radiology: ordered.  Risk OTC drugs. Prescription drug management. Decision regarding hospitalization.   This patient is a 58 year old female who presents to the ED for concern of fever, dizziness, hemoptysis, worsening cough, right upper chest pain x 3 days with previous history of lung cancer currently seeing palliative care.    Patient was initially found laying on the corner of the room.  And when questioned why she was on the ground in the corner of the room, states that she was hot and felt dizzy and decided to lay down to cool off.  Patient was assisted off the ground into the bed.  Patient noted to be mildly tachycardic at that time, borderline febrile with a temp of 100.2 F.    On physical exam, patient is in no acute distress, afebrile, alert and orient x 4, speaking in full sentences, nontachypneic.  Borderline tachycardic with heart rate in the high 90s.  Rales noted in the right lung.  No abdominal tenderness noted to palpation.  Patient is able to ambulate on her own.  Exam is is unremarkable with normal neuroexam.    Patient really does not want to do any opioids or NSAIDs.  Son said we went with ketamine  which was unaffected and had discussed with the mother which was effective.  Patient was treated with Rocephin  and azithromycin  for possible underlying pneumonia considering his fever,  worsening cough, and effusion in the presence of cancer with previous pneumonia being treated already with amoxicillin  and not with Augmentin and white count remaining elevated throughout that time.  Will have patient admitted for pain pain management and being SIRS positive requiring treatments for possible underlying pneumonia.   Patient care was transferred over to Dr. Sundil.      Differential diagnoses prior to evaluation: The emergent differential diagnosis includes, but is not limited to, pneumonia, PE, bronchitis, metastasis, pleural effusion, ACS, AAS. This is not an exhaustive differential.    Past Medical History / Co-morbidities / Social History: Type 1 diabetes, Sjogren's syndrome, neurogenic bladder, [place] hypertension, hyponatremia, lung cancer, small intestine bacterial overgrowth   Additional history: Chart reviewed. Pertinent results include:  Saw palliative care on 03/19/2024 noted to have been experiencing significant fatigue at that time,  as well as experiencing nausea using Zofran  for and wheezing using inhaler and prednisone  at that time.   Seen in ED on 3/20/125 for hemoptysis patient was noted to have a small volume nonocclusive thrombus in the proximal subsegmental artery with concerns of mass on today postobstructive pneumonia.  Determined not to be a candidate for anticoagulants per pulmonology.  Was supposed to be placed on Augmentin but declined due to stating an allergy and has had was placed on amoxicillin .   Lab Tests/Imaging studies: I personally interpreted labs/imaging and the pertinent results include:   CBC did note a leukocytosis with the white count of 19.8.  As well as a anemia with a hemoglobin of 10.0 decreased from previous eleven 1 month ago CMP notes a mild hyponatremia with a sodium of 131. Lactic acid unremarkable  CT angio shows truncated lobe and middle lobe of right pulmonary artery secondary to presence of large right lung mass, very large  lung mass noted mid upper right lobe of right lung with small effusion and mild lingular and anteromedial lateral middle lobe scarring. I agree with the radiologist interpretation.   Medications: I ordered medication including ketamine  azelastine, Rocephin , Zofran , Tylenol .  I have reviewed the patients home medicines and have made adjustments as needed.   Social Determinants of Health: Currently being seen by palliative care for lung cancer   Disposition: After consideration of the diagnostic results and the patients response to treatment, I feel that the patient would benefit from admission, patient care transferred over to hospitalist Dr. Sundil.   Final diagnoses:  Hemoptysis  Fever, unspecified fever cause  SIRS (systemic inflammatory response syndrome) [facility])    ED Discharge Orders          Ordered    fentaNYL  (DURAGESIC ) 12 MCG/HR  every 72 hours,   Status:  Discontinued        03/26/24 0855    fentaNYL  (DURAGESIC ) 12 MCG/HR  every 72 hours,   Status:  Discontinued        03/26/24 0913    Morphine  Sulfate (MORPHINE  CONCENTRATE) 10 mg / 0.5 ml concentrated solution  Every 2 hours PRN,   Status:  Discontinued        03/26/24 0855    insulin  detemir (LEVEMIR  FLEXTOUCH) 100 UNIT/ML FlexPen  2 times daily,   Status:  Discontinued        03/26/24 0855    HYDROcodone  bit-homatropine (HYCODAN) 5-1.5 MG/5ML syrup  Every 4 hours PRN,   Status:  Discontinued        03/26/24 0855    LORazepam  (ATIVAN ) 1 MG tablet  Every 4 hours PRN,   Status:  Discontinued        03/26/24 0855    ciprofloxacin  (CIPRO ) 500 MG tablet  2 times daily,   Status:  Discontinued        03/26/24 0855    Increase activity slowly        03/26/24 0855    Diet - low sodium heart healthy        03/26/24 0855    Diet Carb Modified        03/26/24 0855    Discharge instructions       Comments: While taking hycodan do not use the morphine  liquid at the same time   03/26/24 0856    Morphine  Sulfate (MORPHINE   CONCENTRATE) 10 mg / 0.5 ml concentrated solution  Every 2 hours PRN,   Status:  Discontinued        03/26/24 0913               Hayes Lipps, PA-C 04/28/24 1503    Afton Horse T, DO 04/29/24 1503

## 2024-04-30 ENCOUNTER — Other Ambulatory Visit: Payer: Self-pay

## 2024-04-30 DIAGNOSIS — Z515 Encounter for palliative care: Secondary | ICD-10-CM

## 2024-04-30 DIAGNOSIS — G893 Neoplasm related pain (acute) (chronic): Secondary | ICD-10-CM

## 2024-04-30 DIAGNOSIS — C3490 Malignant neoplasm of unspecified part of unspecified bronchus or lung: Secondary | ICD-10-CM

## 2024-04-30 DIAGNOSIS — R110 Nausea: Secondary | ICD-10-CM

## 2024-04-30 DIAGNOSIS — J432 Centrilobular emphysema: Secondary | ICD-10-CM

## 2024-04-30 MED ORDER — PROMETHAZINE HCL 25 MG RE SUPP: 25.0000 mg | Freq: Four times a day (QID) | RECTAL | 2 refills | Status: DC | PRN

## 2024-04-30 MED ORDER — MORPHINE SULFATE ER 30 MG PO TBCR: 30.0000 mg | EXTENDED_RELEASE_TABLET | Freq: Two times a day (BID) | ORAL | 0 refills | Status: DC

## 2024-04-30 MED ORDER — FLUTICASONE-SALMETEROL 115-21 MCG/ACT IN AERO: 2.0000 | INHALATION_SPRAY | Freq: Two times a day (BID) | RESPIRATORY_TRACT | 3 refills | Status: DC

## 2024-04-30 MED ORDER — ONDANSETRON 4 MG PO TBDP: 4.0000 mg | ORAL_TABLET | Freq: Four times a day (QID) | ORAL | 1 refills | Status: DC | PRN

## 2024-04-30 MED ORDER — MORPHINE SULFATE 15 MG PO TABS: 15.0000 mg | ORAL_TABLET | Freq: Four times a day (QID) | ORAL | 0 refills | Status: DC | PRN

## 2024-04-30 NOTE — Telephone Encounter (Signed)
 Pt called for refills, informed that she would need a follow up in the future to be re-established and continue to have refills. pt verbalized understanding and appt made. No further needs at this time.

## 2024-05-01 ENCOUNTER — Telehealth: Payer: Self-pay

## 2024-05-01 ENCOUNTER — Inpatient Hospital Stay (HOSPITAL_BASED_OUTPATIENT_CLINIC_OR_DEPARTMENT_OTHER)
Admission: EM | Admit: 2024-05-01 | Discharge: 2024-05-08 | DRG: 947 | Disposition: A | Discharge status: Hospice | Attending: Internal Medicine | Admitting: Internal Medicine

## 2024-05-01 ENCOUNTER — Other Ambulatory Visit: Payer: Self-pay

## 2024-05-01 DIAGNOSIS — E860 Dehydration: Secondary | ICD-10-CM | POA: Diagnosis present

## 2024-05-01 DIAGNOSIS — Z881 Allergy status to other antibiotic agents status: Secondary | ICD-10-CM

## 2024-05-01 DIAGNOSIS — Z90710 Acquired absence of both cervix and uterus: Secondary | ICD-10-CM

## 2024-05-01 DIAGNOSIS — Z820 Family history of epilepsy and other diseases of the nervous system: Secondary | ICD-10-CM

## 2024-05-01 DIAGNOSIS — C3491 Malignant neoplasm of unspecified part of right bronchus or lung: Secondary | ICD-10-CM | POA: Diagnosis present

## 2024-05-01 DIAGNOSIS — G43909 Migraine, unspecified, not intractable, without status migrainosus: Secondary | ICD-10-CM | POA: Diagnosis present

## 2024-05-01 DIAGNOSIS — M6250 Muscle wasting and atrophy, not elsewhere classified, unspecified site: Secondary | ICD-10-CM | POA: Diagnosis present

## 2024-05-01 DIAGNOSIS — D75839 Thrombocytosis, unspecified: Secondary | ICD-10-CM | POA: Diagnosis present

## 2024-05-01 DIAGNOSIS — E222 Syndrome of inappropriate secretion of antidiuretic hormone: Secondary | ICD-10-CM | POA: Diagnosis present

## 2024-05-01 DIAGNOSIS — Z8619 Personal history of other infectious and parasitic diseases: Secondary | ICD-10-CM

## 2024-05-01 DIAGNOSIS — Z87891 Personal history of nicotine dependence: Secondary | ICD-10-CM

## 2024-05-01 DIAGNOSIS — N319 Neuromuscular dysfunction of bladder, unspecified: Secondary | ICD-10-CM | POA: Diagnosis present

## 2024-05-01 DIAGNOSIS — M3500 Sicca syndrome, unspecified: Secondary | ICD-10-CM | POA: Diagnosis present

## 2024-05-01 DIAGNOSIS — R52 Pain, unspecified: Principal | ICD-10-CM

## 2024-05-01 DIAGNOSIS — R64 Cachexia: Secondary | ICD-10-CM | POA: Diagnosis present

## 2024-05-01 DIAGNOSIS — G893 Neoplasm related pain (acute) (chronic): Principal | ICD-10-CM | POA: Diagnosis present

## 2024-05-01 DIAGNOSIS — Z66 Do not resuscitate: Secondary | ICD-10-CM | POA: Diagnosis present

## 2024-05-01 DIAGNOSIS — Z794 Long term (current) use of insulin: Secondary | ICD-10-CM

## 2024-05-01 DIAGNOSIS — K581 Irritable bowel syndrome with constipation: Secondary | ICD-10-CM | POA: Diagnosis present

## 2024-05-01 DIAGNOSIS — Z7952 Long term (current) use of systemic steroids: Secondary | ICD-10-CM

## 2024-05-01 DIAGNOSIS — H548 Legal blindness, as defined in USA: Secondary | ICD-10-CM | POA: Diagnosis present

## 2024-05-01 DIAGNOSIS — R0789 Other chest pain: Secondary | ICD-10-CM | POA: Diagnosis present

## 2024-05-01 DIAGNOSIS — F419 Anxiety disorder, unspecified: Secondary | ICD-10-CM | POA: Diagnosis present

## 2024-05-01 DIAGNOSIS — Z515 Encounter for palliative care: Secondary | ICD-10-CM

## 2024-05-01 DIAGNOSIS — Z79890 Hormone replacement therapy (postmenopausal): Secondary | ICD-10-CM

## 2024-05-01 DIAGNOSIS — Z882 Allergy status to sulfonamides status: Secondary | ICD-10-CM

## 2024-05-01 DIAGNOSIS — R0602 Shortness of breath: Secondary | ICD-10-CM | POA: Diagnosis present

## 2024-05-01 DIAGNOSIS — Z79899 Other long term (current) drug therapy: Secondary | ICD-10-CM

## 2024-05-01 DIAGNOSIS — Z9049 Acquired absence of other specified parts of digestive tract: Secondary | ICD-10-CM

## 2024-05-01 DIAGNOSIS — F909 Attention-deficit hyperactivity disorder, unspecified type: Secondary | ICD-10-CM | POA: Diagnosis present

## 2024-05-01 DIAGNOSIS — Z681 Body mass index (BMI) 19 or less, adult: Secondary | ICD-10-CM

## 2024-05-01 DIAGNOSIS — R112 Nausea with vomiting, unspecified: Secondary | ICD-10-CM | POA: Diagnosis present

## 2024-05-01 DIAGNOSIS — R110 Nausea: Secondary | ICD-10-CM

## 2024-05-01 DIAGNOSIS — D630 Anemia in neoplastic disease: Secondary | ICD-10-CM | POA: Diagnosis present

## 2024-05-01 DIAGNOSIS — D72829 Elevated white blood cell count, unspecified: Secondary | ICD-10-CM | POA: Diagnosis present

## 2024-05-01 DIAGNOSIS — D649 Anemia, unspecified: Secondary | ICD-10-CM

## 2024-05-01 DIAGNOSIS — R54 Age-related physical debility: Secondary | ICD-10-CM | POA: Diagnosis present

## 2024-05-01 DIAGNOSIS — M069 Rheumatoid arthritis, unspecified: Secondary | ICD-10-CM | POA: Diagnosis present

## 2024-05-01 DIAGNOSIS — I671 Cerebral aneurysm, nonruptured: Secondary | ICD-10-CM | POA: Diagnosis present

## 2024-05-01 DIAGNOSIS — E878 Other disorders of electrolyte and fluid balance, not elsewhere classified: Secondary | ICD-10-CM | POA: Diagnosis present

## 2024-05-01 DIAGNOSIS — R079 Chest pain, unspecified: Secondary | ICD-10-CM | POA: Diagnosis present

## 2024-05-01 DIAGNOSIS — E1040 Type 1 diabetes mellitus with diabetic neuropathy, unspecified: Secondary | ICD-10-CM | POA: Diagnosis present

## 2024-05-01 DIAGNOSIS — Z7951 Long term (current) use of inhaled steroids: Secondary | ICD-10-CM

## 2024-05-01 DIAGNOSIS — R4589 Other symptoms and signs involving emotional state: Secondary | ICD-10-CM

## 2024-05-01 DIAGNOSIS — J189 Pneumonia, unspecified organism: Secondary | ICD-10-CM | POA: Diagnosis present

## 2024-05-01 DIAGNOSIS — Z85820 Personal history of malignant melanoma of skin: Secondary | ICD-10-CM

## 2024-05-01 DIAGNOSIS — E7800 Pure hypercholesterolemia, unspecified: Secondary | ICD-10-CM | POA: Diagnosis present

## 2024-05-01 DIAGNOSIS — F1021 Alcohol dependence, in remission: Secondary | ICD-10-CM | POA: Diagnosis present

## 2024-05-01 DIAGNOSIS — R627 Adult failure to thrive: Secondary | ICD-10-CM | POA: Diagnosis present

## 2024-05-01 DIAGNOSIS — Z886 Allergy status to analgesic agent status: Secondary | ICD-10-CM

## 2024-05-01 DIAGNOSIS — Z7189 Other specified counseling: Secondary | ICD-10-CM

## 2024-05-01 LAB — URINALYSIS, W/ REFLEX TO CULTURE (INFECTION SUSPECTED)
Bacteria, UA: NONE SEEN
Bilirubin Urine: NEGATIVE
Glucose, UA: NEGATIVE mg/dL
Hgb urine dipstick: NEGATIVE
Ketones, ur: NEGATIVE mg/dL
Leukocytes,Ua: NEGATIVE
Nitrite: NEGATIVE
Protein, ur: NEGATIVE mg/dL
Specific Gravity, Urine: <1.005 — ABNORMAL LOW (ref 1.005–1.030)
pH: 8 (ref 5.0–8.0)

## 2024-05-01 LAB — CBC WITH DIFFERENTIAL/PLATELET
Abs Immature Granulocytes: 0.12 10*3/uL — ABNORMAL HIGH (ref 0.00–0.07)
Basophils Absolute: 0.1 10*3/uL (ref 0.0–0.1)
Basophils Relative: 0 %
Eosinophils Absolute: 0.1 10*3/uL (ref 0.0–0.5)
Eosinophils Relative: 0 %
HCT: 33.9 % — ABNORMAL LOW (ref 36.0–46.0)
Hemoglobin: 10.9 g/dL — ABNORMAL LOW (ref 12.0–15.0)
Immature Granulocytes: 1 %
Lymphocytes Relative: 7 %
Lymphs Abs: 1.2 10*3/uL (ref 0.7–4.0)
MCH: 27.2 pg (ref 26.0–34.0)
MCHC: 32.2 g/dL (ref 30.0–36.0)
MCV: 84.5 fL (ref 80.0–100.0)
Monocytes Absolute: 1.2 10*3/uL — ABNORMAL HIGH (ref 0.1–1.0)
Monocytes Relative: 7 %
Neutro Abs: 14.7 10*3/uL — ABNORMAL HIGH (ref 1.7–7.7)
Neutrophils Relative %: 85 %
Platelets: 529 10*3/uL — ABNORMAL HIGH (ref 150–400)
RBC: 4.01 MIL/uL (ref 3.87–5.11)
RDW: 16.4 % — ABNORMAL HIGH (ref 11.5–15.5)
WBC: 17.4 10*3/uL — ABNORMAL HIGH (ref 4.0–10.5)
nRBC: 0 % (ref 0.0–0.2)

## 2024-05-01 LAB — COMPREHENSIVE METABOLIC PANEL WITH GFR
ALT: 27 U/L (ref 0–44)
AST: 53 U/L — ABNORMAL HIGH (ref 15–41)
Albumin: 3.8 g/dL (ref 3.5–5.0)
Alkaline Phosphatase: 147 U/L — ABNORMAL HIGH (ref 38–126)
Anion gap: 10 (ref 5–15)
BUN: 9 mg/dL (ref 6–20)
CO2: 29 mmol/L (ref 22–32)
Calcium: 9.6 mg/dL (ref 8.9–10.3)
Chloride: 89 mmol/L — ABNORMAL LOW (ref 98–111)
Creatinine, Ser: 0.74 mg/dL (ref 0.44–1.00)
GFR, Estimated: >60 mL/min (ref >60)
Glucose, Bld: 152 mg/dL — ABNORMAL HIGH (ref 70–99)
Potassium: 4.6 mmol/L (ref 3.5–5.1)
Sodium: 128 mmol/L — ABNORMAL LOW (ref 135–145)
Total Bilirubin: 0.4 mg/dL (ref 0.0–1.2)
Total Protein: 7.6 g/dL (ref 6.5–8.1)

## 2024-05-01 LAB — LIPASE, BLOOD: Lipase: <10 U/L — ABNORMAL LOW (ref 11–51)

## 2024-05-01 MED ORDER — SODIUM CHLORIDE 0.9 % IV BOLUS
500.0000 mL | Freq: Once | INTRAVENOUS | Status: AC
  Administered 2024-05-01: 500 mL via INTRAVENOUS

## 2024-05-01 MED ORDER — PROMETHAZINE HCL 25 MG/ML IJ SOLN
INTRAMUSCULAR | Status: AC
  Filled 2024-05-01: qty 1

## 2024-05-01 MED ORDER — HYDROMORPHONE HCL 1 MG/ML IJ SOLN
1.0000 mg | Freq: Once | INTRAMUSCULAR | Status: AC
  Administered 2024-05-01: 1 mg via INTRAVENOUS
  Filled 2024-05-01: qty 1

## 2024-05-01 MED ORDER — SODIUM CHLORIDE 0.9 % IV SOLN
25.0000 mg | Freq: Four times a day (QID) | INTRAVENOUS | Status: DC | PRN
  Administered 2024-05-01 – 2024-05-02 (×2): 25 mg via INTRAVENOUS
  Filled 2024-05-01: qty 25
  Filled 2024-05-01: qty 1

## 2024-05-01 NOTE — Telephone Encounter (Signed)
 Received voicemail from patient today stating that she is interested in radiation treatments but expressed concern that "it may be too late." She reported difficulty managing her pain, stating she "cannot get a handle on this pain, does not want to take more morphine  than necessary, and continues to experience frequent hospital admissions.  Information will be relayed to Dr. Marguerita Shih for review. A follow-up call will be made to the patient with recommendations once reviewed.

## 2024-05-01 NOTE — ED Provider Notes (Signed)
 [place] [facility] AT [facility] Provider Note   CSN: [id number] Arrival date & time: 05/01/24  1701     Patient presents with: No chief complaint on file.   Mary Erickson is a 58 y.o. female.   The history is provided by the patient and medical records. No language interpreter was used.  Illness Location:  Pain Severity:  Severe Onset quality:  Gradual Duration:  4 days Timing:  Constant Progression:  Waxing and waning Chronicity:  Chronic Associated symptoms: fatigue, nausea and vomiting   Associated symptoms: no abdominal pain, no chest pain, no congestion, no cough, no diarrhea, no fever, no headaches, no rash and no shortness of breath        Prior to Admission medications   Medication Sig Start Date End Date Taking? Authorizing Provider  acetaminophen  (TYLENOL ) 500 MG tablet Take 1,000 mg by mouth every 6 (six) hours as needed for moderate pain.    [provider]  Cholecalciferol  (VITAMIN D3) 125 MCG (5000 UT) capsule Take 5,000 Units by mouth daily.    [provider]  Continuous Glucose Sensor (DEXCOM G7 SENSOR) MISC Inject 1 applicator into the skin See admin instructions. Every 10 days Patient not taking: Reported on 04/25/2024 11/27/23   [provider]  Cyanocobalamin  (B-12) 3000 MCG CAPS Take 3,000 mcg by mouth daily.    [provider]  Dexlansoprazole  (DEXILANT ) 30 MG capsule DR Take 1 capsule (30 mg total) by mouth daily. 04/11/24   Sueellen Emery, MD  diclofenac  Sodium (VOLTAREN ) 1 % GEL Apply 2 g topically 4 (four) times daily. Patient taking differently: Apply 2 g topically 4 (four) times daily as needed (pain). 12/17/19   Darr, Jacob, PA-C  diphenhydrAMINE  HCl (BENADRYL  ALLERGY PO) Take 25 mg by mouth at bedtime.    [provider]  fluticasone-salmeterol (ADVAIR  HFA) 115-21 MCG/ACT inhaler Inhale 2 puffs into the lungs 2 (two) times daily. 05/04/24   Pickenpack-Cousar, Athena N, NP   insulin  degludec (TRESIBA FLEXTOUCH) 100 UNIT/ML FlexTouch Pen Inject into the skin daily. Patient not taking: Reported on 04/25/2024    [provider]  insulin  detemir (LEVEMIR  FLEXTOUCH) 100 UNIT/ML FlexPen Inject 5 Units into the skin 2 (two) times daily. Patient taking differently: Inject 8 Units into the skin 2 (two) times daily. 04/10/24   Oral Billings, MD  Insulin  lispro (HUMALOG JUNIOR KWIKPEN) 100 UNIT/ML Inject 1-15 Units into the skin 3 (three) times daily as needed (high blood sugar). Insulin  to carb ratio 1:13    [provider]  Ketotifen  Fumarate ([employer] ALLERGY EYE DROPS OP) Place 1 drop into both eyes 2 (two) times daily.    [provider]  levothyroxine  (SYNTHROID ) 200 MCG tablet Take 1 tablet (200 mcg total) by mouth daily at 6 (six) AM. 04/11/24 05/11/24  Oral Billings, MD  linaclotide  (LINZESS ) 145 MCG CAPS capsule Take 1 capsule (145 mcg total) by mouth daily before breakfast. Patient taking differently: Take 145-290 mcg by mouth daily before breakfast. 1 cap alternating with 2 caps ever other day 11/14/23   Dorsey, Ying C, MD  Melatonin 10-10 MG TBCR Take 10 mg by mouth at bedtime.    [provider]  methylphenidate  36 MG PO CR tablet Take 36 mg by mouth every morning.    [provider]  morphine  (MS CONTIN ) 30 MG 12 hr tablet Take 1 tablet (30 mg total) by mouth every 12 (twelve) hours. 05/04/24   Pickenpack-Cousar, Giles Labrum, NP  morphine  (  MSIR) 15 MG tablet Take 1 tablet (15 mg total) by mouth every 6 (six) hours as needed for severe pain (pain score 7-10). 05/04/24   Pickenpack-Cousar, Athena N, NP  Multiple Vitamins-Minerals (MULTIVITAMINS THER. W/MINERALS) TABS Take 1 tablet by mouth daily.    [provider]  naloxone  (NARCAN ) nasal spray 4 mg/0.1 mL Place 1 spray into the nose as needed. 04/28/24   Pokhrel, Laxman, MD  ondansetron  (ZOFRAN -ODT) 4 MG disintegrating tablet Take 1 tablet (4 mg total) by mouth every 6 (six)  hours as needed for nausea or vomiting. 05/04/24   Pickenpack-Cousar, Giles Labrum, NP  Polyethyl Glycol-Propyl Glycol (SYSTANE OP) Place 1 drop into both eyes 3 (three) times daily as needed (dry eyes).    [provider]  polyethylene glycol powder (GLYCOLAX /MIRALAX ) 17 GM/SCOOP powder Take 17 g by mouth 2 (two) times daily. Patient taking differently: Take 17 g by mouth daily. 04/10/24   Oral Billings, MD  predniSONE  (DELTASONE ) 5 MG tablet Take 1 tablet (5 mg total) by mouth 2 (two) times daily. 12/25/23   Macdonald Savoy, MD  promethazine  (PHENERGAN ) 25 MG suppository Place 1 suppository (25 mg total) rectally every 6 (six) hours as needed for nausea or vomiting. 05/04/24   Pickenpack-Cousar, Giles Labrum, NP  vitamin E  180 MG (400 UNITS) capsule Take 400 Units by mouth at bedtime.    [provider]    Allergies: Doxycycline, Aspirin , Nsaids, Gluten meal, Lactose, Oxycodone , and Sulfa antibiotics    Review of Systems  Constitutional:  Positive for fatigue. Negative for chills and fever.  HENT:  Negative for congestion.   Respiratory:  Negative for cough, chest tightness and shortness of breath.   Cardiovascular:  Negative for chest pain.  Gastrointestinal:  Positive for nausea and vomiting. Negative for abdominal pain, constipation and diarrhea.  Genitourinary:  Positive for frequency. Negative for dysuria.  Musculoskeletal:  Negative for back pain and neck pain.  Skin:  Negative for rash and wound.  Neurological:  Negative for headaches.  Psychiatric/Behavioral:  Negative for agitation and confusion.   All other systems reviewed and are negative.   Updated Vital Signs BP 129/79   Pulse 69   Temp 98.3 F (36.8 C) (Oral)   Resp 18   Ht 5' 8 (1.727 m)   Wt 55.3 kg   SpO2 98%   BMI 18.55 kg/m   Physical Exam Vitals and nursing note reviewed.  Constitutional:      General: She is not in acute distress.    Appearance: She is well-developed. She is not  ill-appearing, toxic-appearing or diaphoretic.  HENT:     Head: Normocephalic and atraumatic.     Nose: No congestion or rhinorrhea.     Mouth/Throat:     Mouth: Mucous membranes are moist.     Pharynx: No oropharyngeal exudate or posterior oropharyngeal erythema.   Eyes:     Conjunctiva/sclera: Conjunctivae normal.    Cardiovascular:     Rate and Rhythm: Normal rate and regular rhythm.     Heart sounds: No murmur heard. Pulmonary:     Effort: Pulmonary effort is normal. No respiratory distress.     Breath sounds: Normal breath sounds. No rhonchi or rales.  Chest:     Chest wall: No tenderness.  Abdominal:     Palpations: Abdomen is soft.     Tenderness: There is no abdominal tenderness. There is no guarding or rebound.   Musculoskeletal:        General: No swelling  or tenderness.     Cervical back: Neck supple.     Right lower leg: No edema.     Left lower leg: No edema.   Skin:    General: Skin is warm and dry.     Capillary Refill: Capillary refill takes less than 2 seconds.     Findings: No erythema or rash.   Neurological:     General: No focal deficit present.     Mental Status: She is alert.   Psychiatric:        Mood and Affect: Mood normal.     (all labs ordered are listed, but only abnormal results are displayed) Labs Reviewed  CBC WITH DIFFERENTIAL/PLATELET - Abnormal; Notable for the following components:      Result Value   WBC 17.4 (*)    Hemoglobin 10.9 (*)    HCT 33.9 (*)    RDW 16.4 (*)    Platelets 529 (*)    Neutro Abs 14.7 (*)    Monocytes Absolute 1.2 (*)    Abs Immature Granulocytes 0.12 (*)    All other components within normal limits  COMPREHENSIVE METABOLIC PANEL WITH GFR - Abnormal; Notable for the following components:   Sodium 128 (*)    Chloride 89 (*)    Glucose, Bld 152 (*)    AST 53 (*)    Alkaline Phosphatase 147 (*)    All other components within normal limits  LIPASE, BLOOD - Abnormal; Notable for the following  components:   Lipase <10 (*)    All other components within normal limits  URINALYSIS, W/ REFLEX TO CULTURE (INFECTION SUSPECTED) - Abnormal; Notable for the following components:   Color, Urine COLORLESS (*)    Specific Gravity, Urine <1.005 (*)    All other components within normal limits    EKG: None  Radiology: No results found.   Procedures   Medications Ordered in the ED  promethazine  (PHENERGAN ) 25 mg in sodium chloride  0.9 % 50 mL IVPB (0 mg Intravenous Stopped 05/01/24 2230)  HYDROmorphone  (DILAUDID ) injection 1 mg (1 mg Intravenous Given 05/01/24 2202)  sodium chloride  0.9 % bolus 500 mL (0 mLs Intravenous Stopped 05/01/24 2230)  promethazine  (PHENERGAN ) 25 MG/ML injection (  Given 05/01/24 2202)                                    Medical Decision Making Amount and/or Complexity of Data Reviewed Labs: ordered.  Risk Prescription drug management.    Mary Erickson is a 58 y.o. female with a past medical history significant for hypothyroidism, diabetes, hypertension, GERD, and lung cancer who presents for uncontrolled pain.  According to patient, she was going to see her oncologist, Dr. Liam Redhead, on Monday to discuss radiation treatment as this was offered previously but she had decided she did not want it first.  She now does want to consider that because her pain is uncontrolled.  She was recently mated for pain control and has been taking all of her home medicines without pain relief.  On POD manage evaluation she is tearful and crying and having dry heaving and nausea.  She says the pain is uncontrolled and she was told to go to the emergency department if symptoms were to get to this point.  She otherwise denies new fevers, chills or cough.  Denies any other patient with diarrhea.  She does report some urinary frequency but denies dysuria.  She does not think there is something else going on but just thinks she has uncontrolled pain from her cancer that is  worsening.  On my exam, lungs were clear and chest was nontender.  Patient had some pain in her back.  Midline was not tender.  Abdomen had normal bowel sounds.  Patient has dry mucous membranes and is very tearful and ill-appearing.  I called and spoke with Dr. Scherrie Curt with oncology who does feel patient needs admission to medicine at [facility] for pain control and optimization of her pain regimen as well as see oncology to discuss if she is appropriate for radiation initiation.  Screening labs were obtained and we will call for admission for pain management.  She was given Dilaudid , Phenergan , and fluids.  Her labs showed leukocytosis and mild anemia as well as hyponatremia.  Urinalysis did not show UTI.  Will admit for further management.      Final diagnoses:  Pain  Nausea and vomiting, unspecified vomiting type    Clinical Impression: 1. Pain   2. Nausea and vomiting, unspecified vomiting type     Disposition: Admit  This note was prepared with assistance of Dragon voice recognition software. Occasional wrong-word or sound-a-like substitutions may have occurred due to the inherent limitations of voice recognition software.       Prentice Sackrider, Marine Sia, MD 05/01/24 [phone]

## 2024-05-01 NOTE — ED Triage Notes (Signed)
 Pt POV reporting pt reporting persistent pain due to stage IV lung cancer, taking pian meds at home with no relief, requesting better pain management.

## 2024-05-02 ENCOUNTER — Encounter (HOSPITAL_COMMUNITY): Payer: Self-pay | Admitting: Family Medicine

## 2024-05-02 ENCOUNTER — Observation Stay (HOSPITAL_COMMUNITY)

## 2024-05-02 DIAGNOSIS — I671 Cerebral aneurysm, nonruptured: Secondary | ICD-10-CM | POA: Diagnosis present

## 2024-05-02 DIAGNOSIS — H548 Legal blindness, as defined in USA: Secondary | ICD-10-CM | POA: Diagnosis present

## 2024-05-02 DIAGNOSIS — R4589 Other symptoms and signs involving emotional state: Secondary | ICD-10-CM | POA: Diagnosis not present

## 2024-05-02 DIAGNOSIS — K5903 Drug induced constipation: Secondary | ICD-10-CM

## 2024-05-02 DIAGNOSIS — Z7951 Long term (current) use of inhaled steroids: Secondary | ICD-10-CM | POA: Diagnosis not present

## 2024-05-02 DIAGNOSIS — Z79899 Other long term (current) drug therapy: Secondary | ICD-10-CM

## 2024-05-02 DIAGNOSIS — R627 Adult failure to thrive: Secondary | ICD-10-CM | POA: Diagnosis present

## 2024-05-02 DIAGNOSIS — D630 Anemia in neoplastic disease: Secondary | ICD-10-CM | POA: Diagnosis present

## 2024-05-02 DIAGNOSIS — Z79890 Hormone replacement therapy (postmenopausal): Secondary | ICD-10-CM | POA: Diagnosis not present

## 2024-05-02 DIAGNOSIS — R64 Cachexia: Secondary | ICD-10-CM | POA: Diagnosis present

## 2024-05-02 DIAGNOSIS — Z7189 Other specified counseling: Secondary | ICD-10-CM | POA: Diagnosis not present

## 2024-05-02 DIAGNOSIS — R110 Nausea: Secondary | ICD-10-CM | POA: Diagnosis not present

## 2024-05-02 DIAGNOSIS — M3500 Sicca syndrome, unspecified: Secondary | ICD-10-CM | POA: Diagnosis present

## 2024-05-02 DIAGNOSIS — Z87891 Personal history of nicotine dependence: Secondary | ICD-10-CM | POA: Diagnosis not present

## 2024-05-02 DIAGNOSIS — C3491 Malignant neoplasm of unspecified part of right bronchus or lung: Secondary | ICD-10-CM

## 2024-05-02 DIAGNOSIS — Z85820 Personal history of malignant melanoma of skin: Secondary | ICD-10-CM | POA: Diagnosis not present

## 2024-05-02 DIAGNOSIS — J189 Pneumonia, unspecified organism: Secondary | ICD-10-CM | POA: Diagnosis present

## 2024-05-02 DIAGNOSIS — E222 Syndrome of inappropriate secretion of antidiuretic hormone: Secondary | ICD-10-CM | POA: Diagnosis present

## 2024-05-02 DIAGNOSIS — G893 Neoplasm related pain (acute) (chronic): Secondary | ICD-10-CM

## 2024-05-02 DIAGNOSIS — R0789 Other chest pain: Secondary | ICD-10-CM | POA: Diagnosis present

## 2024-05-02 DIAGNOSIS — Z681 Body mass index (BMI) 19 or less, adult: Secondary | ICD-10-CM | POA: Diagnosis not present

## 2024-05-02 DIAGNOSIS — D72829 Elevated white blood cell count, unspecified: Secondary | ICD-10-CM | POA: Diagnosis present

## 2024-05-02 DIAGNOSIS — M069 Rheumatoid arthritis, unspecified: Secondary | ICD-10-CM | POA: Diagnosis present

## 2024-05-02 DIAGNOSIS — F1021 Alcohol dependence, in remission: Secondary | ICD-10-CM | POA: Diagnosis present

## 2024-05-02 DIAGNOSIS — F419 Anxiety disorder, unspecified: Secondary | ICD-10-CM

## 2024-05-02 DIAGNOSIS — E1040 Type 1 diabetes mellitus with diabetic neuropathy, unspecified: Secondary | ICD-10-CM | POA: Diagnosis present

## 2024-05-02 DIAGNOSIS — Z515 Encounter for palliative care: Secondary | ICD-10-CM

## 2024-05-02 DIAGNOSIS — Z66 Do not resuscitate: Secondary | ICD-10-CM

## 2024-05-02 DIAGNOSIS — Z794 Long term (current) use of insulin: Secondary | ICD-10-CM | POA: Diagnosis not present

## 2024-05-02 DIAGNOSIS — D75839 Thrombocytosis, unspecified: Secondary | ICD-10-CM | POA: Diagnosis not present

## 2024-05-02 DIAGNOSIS — N319 Neuromuscular dysfunction of bladder, unspecified: Secondary | ICD-10-CM | POA: Diagnosis present

## 2024-05-02 DIAGNOSIS — E7800 Pure hypercholesterolemia, unspecified: Secondary | ICD-10-CM | POA: Diagnosis present

## 2024-05-02 DIAGNOSIS — D649 Anemia, unspecified: Secondary | ICD-10-CM | POA: Diagnosis not present

## 2024-05-02 LAB — OSMOLALITY: Osmolality: 274 mosm/kg — ABNORMAL LOW (ref 275–295)

## 2024-05-02 LAB — CBC
HCT: 35.4 % — ABNORMAL LOW (ref 36.0–46.0)
Hemoglobin: 11.3 g/dL — ABNORMAL LOW (ref 12.0–15.0)
MCH: 27.7 pg (ref 26.0–34.0)
MCHC: 31.9 g/dL (ref 30.0–36.0)
MCV: 86.8 fL (ref 80.0–100.0)
Platelets: 505 10*3/uL — ABNORMAL HIGH (ref 150–400)
RBC: 4.08 MIL/uL (ref 3.87–5.11)
RDW: 16.4 % — ABNORMAL HIGH (ref 11.5–15.5)
WBC: 17.3 10*3/uL — ABNORMAL HIGH (ref 4.0–10.5)
nRBC: 0 % (ref 0.0–0.2)

## 2024-05-02 LAB — BASIC METABOLIC PANEL WITH GFR
Anion gap: 8 (ref 5–15)
BUN: 11 mg/dL (ref 6–20)
CO2: 27 mmol/L (ref 22–32)
Calcium: 8.3 mg/dL — ABNORMAL LOW (ref 8.9–10.3)
Chloride: 91 mmol/L — ABNORMAL LOW (ref 98–111)
Creatinine, Ser: 0.75 mg/dL (ref 0.44–1.00)
GFR, Estimated: >60 mL/min (ref >60)
Glucose, Bld: 154 mg/dL — ABNORMAL HIGH (ref 70–99)
Potassium: 4 mmol/L (ref 3.5–5.1)
Sodium: 126 mmol/L — ABNORMAL LOW (ref 135–145)

## 2024-05-02 LAB — GLUCOSE, CAPILLARY
Glucose-Capillary: 171 mg/dL — ABNORMAL HIGH (ref 70–99)
Glucose-Capillary: 142 mg/dL — ABNORMAL HIGH (ref 70–99)
Glucose-Capillary: 91 mg/dL (ref 70–99)
Glucose-Capillary: 111 mg/dL — ABNORMAL HIGH (ref 70–99)

## 2024-05-02 LAB — OSMOLALITY, URINE: Osmolality, Ur: 458 mosm/kg (ref 300–900)

## 2024-05-02 LAB — SODIUM, URINE, RANDOM: Sodium, Ur: 137 mmol/L

## 2024-05-02 MED ORDER — SODIUM CHLORIDE 0.9 % IV SOLN: INTRAVENOUS | Status: AC

## 2024-05-02 MED ORDER — HYDROMORPHONE 1 MG/ML IV SOLN
INTRAVENOUS | Status: DC
  Administered 2024-05-02: 30 mg via INTRAVENOUS
  Administered 2024-05-02: 2.5 mg via INTRAVENOUS
  Administered 2024-05-02: 2.9 mg via INTRAVENOUS
  Administered 2024-05-02: 1.5 mg via INTRAVENOUS
  Administered 2024-05-03: 5.7 mg via INTRAVENOUS
  Administered 2024-05-03: 1.8 mg via INTRAVENOUS
  Administered 2024-05-03: 2.4 mg via INTRAVENOUS
  Filled 2024-05-02: qty 30

## 2024-05-02 MED ORDER — HYDROMORPHONE HCL 1 MG/ML IJ SOLN
1.0000 mg | INTRAMUSCULAR | Status: DC | PRN
  Administered 2024-05-02 (×2): 1 mg via INTRAVENOUS
  Filled 2024-05-02 (×2): qty 1

## 2024-05-02 MED ORDER — LEVOTHYROXINE SODIUM 100 MCG PO TABS
200.0000 ug | ORAL_TABLET | Freq: Every day | ORAL | Status: DC
  Administered 2024-05-02 – 2024-05-08 (×7): 200 ug via ORAL
  Filled 2024-05-02 (×7): qty 2

## 2024-05-02 MED ORDER — SENNA 8.6 MG PO TABS
1.0000 | ORAL_TABLET | Freq: Two times a day (BID) | ORAL | Status: DC
  Administered 2024-05-02 – 2024-05-08 (×12): 8.6 mg via ORAL
  Filled 2024-05-02 (×13): qty 1

## 2024-05-02 MED ORDER — INSULIN ASPART 100 UNIT/ML IJ SOLN
0.0000 [IU] | Freq: Three times a day (TID) | INTRAMUSCULAR | Status: DC
  Administered 2024-05-03: 2 [IU] via SUBCUTANEOUS

## 2024-05-02 MED ORDER — HYDROMORPHONE HCL 1 MG/ML IJ SOLN
1.0000 mg | INTRAMUSCULAR | Status: DC | PRN
  Administered 2024-05-02 – 2024-05-04 (×12): 1 mg via INTRAVENOUS
  Filled 2024-05-02 (×12): qty 1

## 2024-05-02 MED ORDER — POLYETHYLENE GLYCOL 3350 17 G PO PACK
17.0000 g | PACK | Freq: Every day | ORAL | Status: DC
  Administered 2024-05-02 – 2024-05-04 (×3): 17 g via ORAL
  Filled 2024-05-02 (×5): qty 1

## 2024-05-02 MED ORDER — FLUTICASONE FUROATE-VILANTEROL 200-25 MCG/ACT IN AEPB
1.0000 | INHALATION_SPRAY | Freq: Every day | RESPIRATORY_TRACT | Status: DC
  Administered 2024-05-02 – 2024-05-08 (×5): 1 via RESPIRATORY_TRACT
  Filled 2024-05-02 (×2): qty 28

## 2024-05-02 MED ORDER — NON FORMULARY: 30.0000 mg | Freq: Every day | Status: DC

## 2024-05-02 MED ORDER — ONDANSETRON HCL 4 MG/2ML IJ SOLN
4.0000 mg | Freq: Four times a day (QID) | INTRAMUSCULAR | Status: DC | PRN
  Administered 2024-05-02 – 2024-05-05 (×7): 4 mg via INTRAVENOUS
  Filled 2024-05-02 (×9): qty 2

## 2024-05-02 MED ORDER — PREDNISONE 5 MG PO TABS
5.0000 mg | ORAL_TABLET | Freq: Two times a day (BID) | ORAL | Status: DC
  Administered 2024-05-02 – 2024-05-08 (×13): 5 mg via ORAL
  Filled 2024-05-02 (×13): qty 1

## 2024-05-02 MED ORDER — DEXLANSOPRAZOLE 30 MG PO CPDR
30.0000 mg | DELAYED_RELEASE_CAPSULE | Freq: Every day | ORAL | Status: DC
  Administered 2024-05-02 – 2024-05-08 (×7): 30 mg via ORAL
  Filled 2024-05-02 (×9): qty 1

## 2024-05-02 MED ORDER — MORPHINE SULFATE ER 15 MG PO TBCR: 45.0000 mg | EXTENDED_RELEASE_TABLET | Freq: Two times a day (BID) | ORAL | Status: DC

## 2024-05-02 MED ORDER — LORAZEPAM 2 MG/ML IJ SOLN
0.5000 mg | INTRAMUSCULAR | Status: DC | PRN
  Administered 2024-05-02 – 2024-05-04 (×5): 0.5 mg via INTRAVENOUS
  Filled 2024-05-02 (×5): qty 1

## 2024-05-02 MED ORDER — INSULIN ASPART 100 UNIT/ML IJ SOLN: 0.0000 [IU] | Freq: Every day | INTRAMUSCULAR | Status: DC

## 2024-05-02 MED ORDER — ENOXAPARIN SODIUM 40 MG/0.4ML IJ SOSY
40.0000 mg | PREFILLED_SYRINGE | Freq: Every day | INTRAMUSCULAR | Status: DC
  Administered 2024-05-02 – 2024-05-08 (×7): 40 mg via SUBCUTANEOUS
  Filled 2024-05-02 (×7): qty 0.4

## 2024-05-02 MED ORDER — MORPHINE SULFATE 15 MG PO TABS: 15.0000 mg | ORAL_TABLET | Freq: Four times a day (QID) | ORAL | Status: DC | PRN

## 2024-05-02 MED ORDER — INSULIN GLARGINE-YFGN 100 UNIT/ML ~~LOC~~ SOLN
7.0000 [IU] | Freq: Two times a day (BID) | SUBCUTANEOUS | Status: DC
  Administered 2024-05-02 – 2024-05-03 (×4): 7 [IU] via SUBCUTANEOUS
  Filled 2024-05-02 (×5): qty 0.07

## 2024-05-02 MED ORDER — SODIUM CHLORIDE 0.9 % IV SOLN
25.0000 mg | Freq: Four times a day (QID) | INTRAVENOUS | Status: DC | PRN
  Administered 2024-05-02 – 2024-05-04 (×5): 25 mg via INTRAVENOUS
  Filled 2024-05-02 (×2): qty 25
  Filled 2024-05-02: qty 1
  Filled 2024-05-02 (×2): qty 25

## 2024-05-02 NOTE — Progress Notes (Addendum)
 PROGRESS NOTE    Mary Erickson  FMW:[id number] DOB: 17-Oct-1966 DOA: 05/01/2024 PCP: Celestia Harder, NP   Brief Narrative: Mary Erickson is a 58 y.o. female with a history of non-small cell lung cancer, rheumatoid arthritis, Sjogren's disease, diabetes mellitus type, hyperlipidemia, internal carotid artery aneurysm, left eye blindness, migraine, seizures, IBS, neurogenic bladder, chronic constipation.  Patient presented secondary to intractable pain of her right chest related to her known cancer.  Patient started on IV analgesics for management.   Assessment and Plan:  Intractable chest pain Intractable cancer-related pain Patient is followed by hematology/oncology in addition to palliative care for pain management.  Patient is on MS Contin  and morphine  immediate release as an outpatient with inadequate control of pain.  On admission patient's regimen was adjusted in addition to adding Dilaudid  IV as needed.  Palliative care consulted for inpatient management of pain.  Patient is considering radiation therapy for palliative management. - Wound Care recommendations: Patient transition to a Dilaudid  PCA pump; Dilaudid  IV as needed for breakthrough  Nausea and vomiting Possibly related to narcotic medications versus underlying disease process. - Continue Phenergan  as needed - Start Zofran  as needed  Non-small cell lung cancer of the right lung Patient follows with Dr. Gatha as an outpatient, in addition to pulmonology.  Patient also follows with palliative care.  Rheumatoid arthritis Sjogren's disease Patient is on prednisone  as outpatient.  Continued on admission. - Continue prednisone   Diabetes mellitus type 1 Well-controlled based on last hemoglobin A1c of 6.9%.  Patient is managed degludec and insulin  was as an outpatient.  Medications not restarted on admission. - Start insulin  glargine 7 units twice daily - Sliding scale insulin   Hyponatremia Unclear  etiology. - Check serum osmolality - Check urine osmolality/sodium - IV fluids  Leukocytosis Likely related to underlying cancer.  No obvious evidence for infection, although patient does have a new cough.  Leukocytosis has been persistent over the last several months. - Will check two-view chest x-ray to rule out pneumonia  ADHD Noted.  Hypothyroidism - Continue levothyroxine  200 mcg daily  History of migraines Noted.  Asymptomatic.  History of seizures Patient is not on antiepileptic therapy.  Irritable bowel syndrome Chronic constipation - Continue MiraLAX   Neurogenic bladder Patient self catheterizes    DVT prophylaxis: Lovenox  Code Status:   Code Status: Limited: Do not attempt resuscitation (DNR) -DNR-LIMITED -Do Not Intubate/DNI  Family Communication: None at bedside Disposition Plan: Discharge home likely in several days pending ability to improve pain management in addition to outpatient analgesic regimen   Consultants:  Palliative care medicine Hematology/oncology  Procedures:  None  Antimicrobials: None   Subjective: Patient reports ongoing nausea.  No vomiting this morning.  She also reports a dry cough.  Afebrile overnight.  Objective: BP 121/71 (BP Location: Left Arm)   Pulse 71   Temp 98.2 F (36.8 C)   Resp 19   Ht 5' 7.99 (1.727 m)   Wt 59 kg   SpO2 93%   BMI 19.79 kg/m   Examination:  General exam: Appears calm and comfortable.  Somewhat frail-appearing Respiratory system: Diminished auscultation. Respiratory effort normal. Cardiovascular system: S1 & S2 heard, RRR. No murmurs, rubs, gallops or clicks. Gastrointestinal system: Abdomen is nondistended, soft and nontender. Normal bowel sounds heard. Central nervous system: Alert and oriented. No focal neurological deficits. Musculoskeletal: No edema. No calf tenderness Psychiatry: Judgement and insight appear normal. Mood & affect appropriate.    Data Reviewed: I have personally  reviewed following labs  and imaging studies  CBC Lab Results  Component Value Date   WBC 17.3 (H) 05/02/2024   RBC 4.08 05/02/2024   HGB 11.3 (L) 05/02/2024   HCT 35.4 (L) 05/02/2024   MCV 86.8 05/02/2024   MCH 27.7 05/02/2024   PLT 505 (H) 05/02/2024   MCHC 31.9 05/02/2024   RDW 16.4 (H) 05/02/2024   LYMPHSABS 1.2 05/01/2024   MONOABS 1.2 (H) 05/01/2024   EOSABS 0.1 05/01/2024   BASOSABS 0.1 05/01/2024     Last metabolic panel Lab Results  Component Value Date   NA 128 (L) 05/01/2024   K 4.6 05/01/2024   CL 89 (L) 05/01/2024   CO2 29 05/01/2024   BUN 9 05/01/2024   CREATININE 0.74 05/01/2024   GLUCOSE 152 (H) 05/01/2024   GFRNONAA >60 05/01/2024   GFRAA >60 03/18/2020   CALCIUM  9.6 05/01/2024   PHOS 2.7 04/26/2024   PROT 7.6 05/01/2024   ALBUMIN 3.8 05/01/2024   LABGLOB 4.4 (H) 12/08/2023   AGRATIO 0.8 12/08/2023   BILITOT 0.4 05/01/2024   ALKPHOS 147 (H) 05/01/2024   AST 53 (H) 05/01/2024   ALT 27 05/01/2024   ANIONGAP 10 05/01/2024    GFR: Estimated Creatinine Clearance: 71.4 mL/min (by C-G formula based on SCr of 0.74 mg/dL).  No results found for this or any previous visit (from the past 240 hours).    Radiology Studies: No results found.    LOS: 0 days    Elgin Lam, MD [facility] 05/02/2024, 12:01 PM   If 7PM-7AM, please contact night-coverage [email or web address]

## 2024-05-02 NOTE — ED Notes (Signed)
 Report called to [employer] and given to Timoya

## 2024-05-02 NOTE — Hospital Course (Signed)
 Mary Erickson is a 58 y.o. female with a history of non-small cell lung cancer, rheumatoid arthritis, Sjogren's disease, diabetes mellitus type, hyperlipidemia, internal carotid artery aneurysm, left eye blindness, migraine, seizures, IBS, neurogenic bladder, chronic constipation.  Patient presented secondary to intractable pain of her right chest related to her known cancer.  Patient started on IV analgesics for management.

## 2024-05-02 NOTE — Consult Note (Signed)
 Consultation Note Date: 05/02/2024   Patient Name: Mary Erickson  DOB: Mar 19, 1966  MRN: [id number]  Age / Sex: 58 y.o., female   PCP: Celestia Harder, NP Referring Physician: Briana Elgin LABOR, MD  Reason for Consultation: Pain control     Chief Complaint/History of Present Illness:   Patient is a 58 year old female with a past medical history of non-small cell lung cancer-has stated wishes to not receive cancer directed therapies, rheumatoid arthritis, Sjogren's disease diabetes mellitus, hyperlipidemia, internal carotid artery aneurysm, left eye blindness, migraines, seizures, IBS, and neurogenic bladder requiring self-catheterization who was admitted on 05/01/2024 for management of worsening cancer pain.  Palliative medicine team consulted to assist with symptom management. Of note patient is known to palliative medicine team and follows up with palliative medicine team in outpatient setting.  Extensive review of EMR prior to presenting to bedside.  At time of EMR review in past 24 hours, patient has received IV Dilaudid  1 mg as needed x 3 doses.  In outpatient setting patient was supposed to be receiving MS Contin  30 mg twice daily with MS IR 15 mg every 6 hours as needed.  Reviewed recent hospitalist documentation. Review of BMP noting BUN 11, creatinine 0.75, and so GFR > 60.  Patient had leukocytosis of 17.3 though noted to have continuously elevated WBCs. Patient has had multiple admissions or ED visits in past 2 months. Discussed care with bedside RN for update.  ------------------------------------------------------------------------------------------------------------- Advance Care Planning Conversation  Pertinent diagnosis: Non-small cell lung cancer, worsening cancer pain, cancer associated cachexia, multiple hospital/ED presentations in past 2 months  The patient and/or family consented to a voluntary Advance Care Planning Conversation in person. Individuals present for the  conversation: Patient engaged in entirety of conversation with this provider.  Summary of the conversation:  Presented to bedside to meet with patient.  Patient laying in bed.  Able to introduce myself as a member of the palliative medicine team and my role in patient's medical journey.  Discussed patient's medical care up until this point.  Patient noted that she had previously been evaluated for hospice earlier this year and was found to do be too well to be admitted.  Patient has since been following up with outpatient palliative and oncology for management.  Patient has had multiple hospitalizations or presentations to ER in the past 2 months. Patient noted that she does not want to pursue any cancer directed therapies.  Patient noted that had been discussed possibility of radiation at this time for pain which she will consider.  Patient discusses that her primary goal is to not die suffering.  Patient discussed that she is accepting of the fact that she is getting to the end of her life.  We reviewed patient's CODE STATUS based on prior ACP documentation found in EMR.  Discussed CODE STATUS.  Patient had previously stated willingness to be intubated for short time though at this time now appropriately agreeing with DNR/DNI.  Noted will plan to complete new MOST form while patient in the hospital to update this.  Outcome of the conversations and/or documents completed:  Change CODE STATUS to DNR/DNI.  Will plan to complete new MOST form with patient while in the hospital to update wishes that patient wants DNI and does not want even trial of intubation.  I spent 25 minutes providing separately identifiable ACP services with the patient and/or surrogate decision maker in a voluntary, in-person conversation discussing the patient's wishes and goals as detailed in the above note.  Tinnie Radar, DO Palliative Medicine Provider   -------------------------------------------------------------------------------------------------------------  Then to follow-up with patient regarding her symptom management.  Patient describes acute on chronic pain in her right upper chest that can be felt on the front and back.  Patient just bribes this is the location of her cancer and this has been the continuous pain she is having.  Patient describes it as a hot poker going into her.  Patient does not feel the pain is radiating anywhere.  Patient feels that when she receives opioids for management, they initially help though very quickly stop working for her.  Patient had recently just been discharged on 04/28/2024 with pain medications which she was taking at home and felt after couple days they did not assist with her pain management.  Patient had been discharged on MS Contin  30 mg twice daily and MS IR 15 mg every 6 hours as needed.  Patient has received as needed IV Dilaudid  1 mg x 3 doses in past 24 hours in the hospital.  Patient does feel that these 1 mg doses manage her pain because they knock her out.  Discussed pain management at this time.  Introduced idea of PCA and using PCA to appropriately dose find medications patient will need moving forward.  Patient agreeing with use of PCA at this time.  Noted would start at lower dose though can always increase based on patient's symptom response. Explored patient's other symptoms at this time.  Patient describing that her last bowel movement 05/01/2024.  Patient noted it was small and she has been having more issues with constipation recently.  Noted would adjust bowel regimen for MiraLAX  and add senna.  Patient agreement with this plan. Also discussed patient's nausea.  Patient describes that the Phenergan  does help with her nausea when she has it.  Patient notes that she has worsening pain which causes her nausea.  Patient also becomes anxious about worsening pain leading to nausea.  Discussed  addition of Ativan  at this time for nausea management and to potentially help with anxiety.  Patient agreement with this plan.  Spent time answering questions as able.  Palliative medicine team noted will continue to follow patient's medical journey.  Discussed care with hospitalist and RN to coordinate care plan.  Primary Diagnoses  Present on Admission:  Chest wall pain   Palliative Review of Systems: Pain in back and right upper chest  Past Medical History:  Diagnosis Date   Acid reflux    ADHD (attention deficit hyperactivity disorder)    Anemia    Arthritis    elbows, hands, neck, shoulders (10/02/2016)   Calcium  blood increased    Chronic neck pain    Chronic UTI (urinary tract infection)    from self caths (10/02/2016)   Hashimoto's disease    Hepatitis B    acute hepatitis B from lancet   History of alcoholism (HCC)    10/02/2016 sober since 03/14/2005   History of blood transfusion 1984; 1989; 2008   quite a few   History of shortness of breath    Hypercholesteremia    Hypothyroidism    no meds   Internal carotid aneurysm dx'd early 2017   Legally blind in left eye, as defined in [place]     Melanoma (HCC)    left upper arm   Migraines    quite a few in a month; at least 15 (10/02/2016)   Nausea and vomiting 04/08/2024   Neuropathy    neck; not sure if it's  from DM or Sjogren's (10/02/2016)   Pneumonia 1986   1 yr after major OR when I was under anesthesia for 13 hr   Seizures (HCC) ~ 2004; ~ 2013   seizures from diabetes    Self-catheterizes urinary bladder    since OR in 1984 (10/02/2016)   Sjogren's syndrome (HCC)    Thyroid  goiter    still present (10/02/2016)   Type II diabetes mellitus (HCC)    Urinary retention    self caths   Wears glasses    Social History   Socioeconomic History   Marital status: Divorced    Spouse name: Not on file   Number of children: Not on file   Years of education: Not on file   Highest education  level: Not on file  Occupational History   Not on file  Tobacco Use   Smoking status: Former    Current packs/day: 0.00    Average packs/day: 1.5 packs/day for 24.0 years (36.0 ttl pk-yrs)    Types: Cigarettes    Start date: 56    Quit date: 2008    Years since quitting: 17.4   Smokeless tobacco: Never  Vaping Use   Vaping status: Never Used  Substance and Sexual Activity   Alcohol use: No    Comment: 10/02/2016 sober since 03/14/2005   Drug use: Not Currently    Types: Marijuana    Comment: 10/02/2016  occasional; nothing since before 2006   Sexual activity: Never  Other Topics Concern   Not on file  Social History Narrative   Not on file   Social Drivers of Health   Financial Resource Strain: Low Risk  (02/13/2024)   Received from [facility]   Overall Financial Resource Strain (CARDIA)    Difficulty of Paying Living Expenses: Not hard at all  Food Insecurity: No Food Insecurity (05/02/2024)   Hunger Vital Sign    Worried About Running Out of Food in the Last Year: Never true    Ran Out of Food in the Last Year: Never true  Transportation Needs: No Transportation Needs (05/02/2024)   PRAPARE - Administrator, Civil Service (Medical): No    Lack of Transportation (Non-Medical): No  Physical Activity: Unknown (08/13/2023)   Received from [facility]   Exercise Vital Sign    On average, how many days per week do you engage in moderate to strenuous exercise (like a brisk walk)?: Patient declined    Minutes of Exercise per Session: Not on file  Stress: Patient Declined (08/13/2023)   Received from [facility] - Occupational Stress Questionnaire    Feeling of Stress : Patient declined  Social Connections: Moderately Integrated (04/09/2024)   Social Connection and Isolation Panel    Frequency of Communication with Friends and Family: More than three times a week    Frequency of Social Gatherings with Friends and  Family: Three times a week    Attends Religious Services: More than 4 times per year    Active Member of Clubs or Organizations: Yes    Attends Engineer, structural: More than 4 times per year    Marital Status: Divorced   Family History  Problem Relation Age of Onset   Multiple sclerosis Father    Colon cancer Neg Hx    Stomach cancer Neg Hx    Colon polyps Neg Hx    Esophageal cancer Neg Hx    Rectal cancer Neg Hx  Scheduled Meds:  Dexlansoprazole   30 mg Oral Daily   [START ON 05/04/2024] fluticasone  furoate-vilanterol  1 puff Inhalation Daily   levothyroxine   200 mcg Oral Q0600   [START ON 05/04/2024] morphine   45 mg Oral Q12H   polyethylene glycol  17 g Oral Daily   predniSONE   5 mg Oral BID   Continuous Infusions:  sodium chloride      promethazine  (PHENERGAN ) injection (IM or IVPB)     PRN Meds:.HYDROmorphone  (DILAUDID ) injection, [START ON 05/04/2024] morphine , ondansetron  (ZOFRAN ) IV, promethazine  (PHENERGAN ) injection (IM or IVPB) Allergies  Allergen Reactions   Doxycycline Other (See Comments)    Elevates liver enzymes    Aspirin  Other (See Comments)    Can take 81 mg but has to be coated; No other Aspirin  due to bleeding in stools from ulcers   Nsaids Other (See Comments)    GI issues    Gluten Meal Other (See Comments)    Avoid due to autoimmune issues    Lactose Other (See Comments)    Avoid due to autoimmune issues    Oxycodone  Nausea And Vomiting   Sulfa Antibiotics Nausea And Vomiting   CBC:    Component Value Date/Time   WBC 17.4 (H) 05/01/2024 2208   HGB 10.9 (L) 05/01/2024 2208   HGB 11.8 (L) 12/25/2023 1344   HCT 33.9 (L) 05/01/2024 2208   PLT 529 (H) 05/01/2024 2208   PLT 430 (H) 12/25/2023 1344   MCV 84.5 05/01/2024 2208   NEUTROABS 14.7 (H) 05/01/2024 2208   LYMPHSABS 1.2 05/01/2024 2208   MONOABS 1.2 (H) 05/01/2024 2208   EOSABS 0.1 05/01/2024 2208   BASOSABS 0.1 05/01/2024 2208   Comprehensive Metabolic Panel:    Component  Value Date/Time   NA 128 (L) 05/01/2024 2208   K 4.6 05/01/2024 2208   CL 89 (L) 05/01/2024 2208   CO2 29 05/01/2024 2208   BUN 9 05/01/2024 2208   CREATININE 0.74 05/01/2024 2208   CREATININE 0.80 12/25/2023 1344   GLUCOSE 152 (H) 05/01/2024 2208   CALCIUM  9.6 05/01/2024 2208   AST 53 (H) 05/01/2024 2208   AST 25 12/25/2023 1344   ALT 27 05/01/2024 2208   ALT 15 12/25/2023 1344   ALKPHOS 147 (H) 05/01/2024 2208   BILITOT 0.4 05/01/2024 2208   BILITOT 0.4 12/25/2023 1344   PROT 7.6 05/01/2024 2208   ALBUMIN 3.8 05/01/2024 2208    Physical Exam: Vital Signs: BP 121/71 (BP Location: Left Arm)   Pulse 71   Temp 98.2 F (36.8 C)   Resp 19   Ht 5' 7.99 (1.727 m)   Wt 59 kg   SpO2 93%   BMI 19.79 kg/m  SpO2: SpO2: 93 % O2 Device: O2 Device: Room Air O2 Flow Rate:   Intake/output summary:  Intake/Output Summary (Last 24 hours) at 05/02/2024 1045 Last data filed at 05/02/2024 0235 Gross per 24 hour  Intake 0 ml  Output --  Net 0 ml   LBM: Last BM Date : 05/01/24 Baseline Weight: Weight: 55.3 kg Most recent weight: Weight: 59 kg  General: NAD, alert, chronically ill-appearing, frail, cachectic Cardiovascular: RRR Respiratory: no increased work of breathing noted, not in respiratory distress Abdomen: not distended Extremities: Muscle wasting present in all extremities Neuro: A&Ox4, following commands easily Psych: appropriately answers all questions         Palliative Performance Scale: 50%              Additional Data Reviewed: Recent Labs  05/01/24 2208  WBC 17.4*  HGB 10.9*  PLT 529*  NA 128*  BUN 9  CREATININE 0.74    Imaging: DG Chest Portable 1 View CLINICAL DATA:  Known right lung tumor and increasing chest pain, initial encounter  EXAM: PORTABLE CHEST 1 VIEW  COMPARISON:  04/08/2024 CT  FINDINGS: Cardiac shadow is mildly prominent. Left lung is clear. Large soft tissue mass is noted in the right mid and upper lung similar to that seen  on prior CT. No new focal abnormality is noted.  IMPRESSION: Stable right lung mass.  No new focal abnormality is noted.  Electronically Signed   By: Oneil Devonshire M.D.   On: 04/24/2024 18:50    I personally reviewed recent imaging.   Palliative Care Assessment and Plan Summary of Established Goals of Care and Medical Treatment Preferences    # Complex medical decision making/goals of care  - Discussed care with patient as detailed above in HPI.  Patient willing to continue lab work and imaging at this time.  Patient continues to state that she does not want cancer directed therapies such as chemotherapy or immunotherapy.  Patient is willing to consider radiation for pain management though attempting to manage with medications at this time before proceeding with radiation if needed.  Palliative medicine team will engage in conversations moving forward as able and appropriate.  -  Code Status: Limited: Do not attempt resuscitation (DNR) -DNR-LIMITED -Do Not Intubate/DNI       - Reviewed CODE STATUS with patient.  Patient confirming DNR/DNI status.  Have updated in chart.  # Symptom management Patient is receiving these palliative interventions for symptom management with an intent to improve quality of life.   - Pain, severe acute on chronic pain in the setting of non-small cell lung cancer   - Start IV Dilaudid  PCA with bolus dosing of 0.3 mg every 10 minute as needed.  Will not have continuous infusion dosing at this time.  Continue to adjust based on patient's symptom response.   - Change IV Dilaudid  1 mg every 3 hours as needed to breakthrough after PCA   - Discontinue oral opioids to only allow management through PCA   -Consider use of dexamethasone  to help with pain management.  Patient chronically on prednisone  5 mg twice daily for RA.   - Nausea/vomiting Associated with pain and anxiety.  EKG on 04/28/2024 noting QTc 467.   - Consider repeat EKG if receiving multiple QTc  prolonging medications to monitor.   - Receiving Phenergan  25 mg every 6 hours as needed   - Receiving Zofran  4 mg every 6 hours as needed   - Start IV Ativan  0.5 mg every 4 hours as needed    - Constipation Patient noted last BM 05/01/2024 though was small.   - Continue MiraLAX  17 g daily   - Start senna 1 tab twice daily.  Continue to adjust based on symptom response.  # Psycho-social/Spiritual Support:  - Support System: Mother-in-law who lives in [place]   # Discharge Planning:  To Be Determined  Thank you for allowing the palliative care team to participate in the care Evalynn R Palmer.  Tinnie Radar, DO Palliative Care Provider PMT # [phone]  If patient remains symptomatic despite maximum doses, please call PMT at [phone] between 0700 and 1900. Outside of these hours, please call attending, as PMT does not have night coverage.  Billing based on MDM: High  Problems Addressed: One or more chronic illnesses with severe exacerbation, progression,  or side effects of treatment.  Risks: Parenteral controlled substances

## 2024-05-02 NOTE — Plan of Care (Addendum)
 BG 171. Patient given PRN Dilaudid  for pain. LBM 6/20. No acute events overnight.  Problem: Education: Goal: Knowledge of General Education information will improve Description: Including pain rating scale, medication(s)/side effects and non-pharmacologic comfort measures Outcome: Progressing  Problem: Health Behavior/Discharge Planning: Goal: Ability to manage health-related needs will improve Outcome: Progressing   Problem: Clinical Measurements: Goal: Ability to maintain clinical measurements within normal limits will improve Outcome: Progressing Goal: Will remain free from infection Outcome: Progressing   Problem: Nutrition: Goal: Adequate nutrition will be maintained Outcome: Progressing   Problem: Pain Managment: Goal: General experience of comfort will improve and/or be controlled Outcome: Progressing   Problem: Safety: Goal: Ability to remain free from injury will improve Outcome: Progressing

## 2024-05-02 NOTE — H&P (Addendum)
 History and Physical    Mary Erickson FMW:[id number] DOB: [birth year] DOA: 05/01/2024  PCP: Celestia Harder, NP  Patient coming from: [facility] I have personally briefly reviewed patient's old medical records in [facility]  Chief Complaint: intractable cancer pain   HPI: Mary Erickson is a 58 y.o. female with medical history significant of  medical history significant for advanced non-small cell lung carcinoma cancer, history of rheumatoid arthritis, Sjogren disease, type 1 diabetes, ADHD, hypothyroidism, hyperlipidemia, internal carotid aneurysm, left eye blindness, migraine headaches, seizures, IBS, neurogenic bladder does self-catheterization, Chronic constipation who presented to the [facility] recurrent episode of intractable cancer-related pain. Patient of note was recently hospitalized for the same 6/13-6/17 at which time she had morphine  SR  and Morphine  IR added to home pain regimen that solely included fentanyl .  Patient per noted has good pain control on discharge. Patient now two days 72 hours home returns to ED with complaint of uncontrolled pain with associated n/v.  Of note patient is followed by Dr Sherrod and was offered palliative radiation but initially declined.  Patient currently is interested in palliative radiation if it will potentially help with her pain control.  Patient currently denies any fever/chills/ abdominal pain, dysuria/ or diarrhea.   ED Course: Afeb , bp 129/79, hr 69, rr 18 sat 98% on ra   Patient case was discussed with Dr Deanne who recommended admission for further pain control  Labs: Wbc 17.5 ( chronic elevation), hgb 10.9, plt 529 Urine:neg Na 128, K 4.6, CL 89, glu 152, cr 0.74, AST 53 Alphos 147 Lipase <10  Tx: NS 500 cc , phenegran  Dilaudid  1mg   Review of Systems: As per HPI otherwise 10 point review of systems negative.   Past Medical History:  Diagnosis Date   Acid reflux    ADHD (attention deficit hyperactivity disorder)    Anemia     Arthritis    elbows, hands, neck, shoulders (10/02/2016)   Calcium  blood increased    Chronic neck pain    Chronic UTI (urinary tract infection)    from self caths (10/02/2016)   Hashimoto's disease    Hepatitis B    acute hepatitis B from lancet   History of alcoholism (HCC)    10/02/2016 sober since 03/14/2005   History of blood transfusion 1984; 1989; 2008   quite a few   History of shortness of breath    Hypercholesteremia    Hypothyroidism    no meds   Internal carotid aneurysm dx'd early 2017   Legally blind in left eye, as defined in [place]     Melanoma (HCC)    left upper arm   Migraines    quite a few in a month; at least 15 (10/02/2016)   Nausea and vomiting 04/08/2024   Neuropathy    neck; not sure if it's from DM or Sjogren's (10/02/2016)   Pneumonia 1986   1 yr after major OR when I was under anesthesia for 13 hr   Seizures (HCC) ~ 2004; ~ 2013   seizures from diabetes    Self-catheterizes urinary bladder    since OR in 1984 (10/02/2016)   Sjogren's syndrome (HCC)    Thyroid  goiter    still present (10/02/2016)   Type II diabetes mellitus (HCC)    Urinary retention    self caths   Wears glasses     Past Surgical History:  Procedure Laterality Date   ABDOMINAL HYSTERECTOMY  2008   BILATERAL CARPAL TUNNEL RELEASE Bilateral  BRONCHIAL WASHINGS  12/10/2023   Procedure: BRONCHIAL WASHINGS;  Surgeon: Kara Dorn NOVAK, MD;  Location: [facility];  Service: Pulmonary;;   ECTOPIC PREGNANCY SURGERY  1989   ENDOBRONCHIAL ULTRASOUND N/A 12/10/2023   Procedure: ENDOBRONCHIAL ULTRASOUND;  Surgeon: Kara Dorn NOVAK, MD;  Location: [facility];  Service: Pulmonary;  Laterality: N/A;   EXCISION MELANOMA WITH SENTINEL LYMPH NODE BIOPSY Left 10/02/2016   Procedure: WIDE LOCAL EXCISION AND ADVANCEMENT FLAP CLOSURE LEFT UPPER  MELANOMA WITH SENTINEL LYMPH NODE MAPPING AND BIOPSY;  Surgeon: Jina Nephew, MD;  Location: [facility];  Service: General;   Laterality: Left;   EYE SURGERY     FINE NEEDLE ASPIRATION  12/10/2023   Procedure: FINE NEEDLE ASPIRATION (FNA) LINEAR;  Surgeon: Kara Dorn NOVAK, MD;  Location: [facility];  Service: Pulmonary;;   LAPAROSCOPIC CHOLECYSTECTOMY  ~ 01/2016   MELANOMA EXCISION WITH SENTINEL LYMPH NODE BIOPSY Left 10/02/2016   WIDE LOCAL EXCISION AND ADVANCEMENT FLAP CLOSURE LEFT UPPER  MELANOMA WITH SENTINEL LYMPH NODE MAPPING AND BIOPSY   OVARIAN CYST REMOVAL     SHOULDER ARTHROSCOPY W/ ROTATOR CUFF REPAIR Right 2014   partial   STRABISMUS SURGERY Left 1971   STRABISMUS SURGERY Left 03/18/2020   Procedure: STRABISMUS REPAIR;  Surgeon: Neysa Fallow, MD;  Location: [facility];  Service: Ophthalmology;  Laterality: Left;   THYROIDECTOMY N/A 03/01/2023   Procedure: TOTAL THYROIDECTOMY;  Surgeon: Eletha Boas, MD;  Location: [facility];  Service: General;  Laterality: N/A;   TUMOR EXCISION  1984   from tail bone and part of tail bone removed   VIDEO BRONCHOSCOPY N/A 12/10/2023   Procedure: VIDEO BRONCHOSCOPY WITHOUT FLUORO;  Surgeon: Kara Dorn NOVAK, MD;  Location: [facility];  Service: Pulmonary;  Laterality: N/A;     reports that she quit smoking about 17 years ago. Her smoking use included cigarettes. She started smoking about 41 years ago. She has a 36 pack-year smoking history. She has never used smokeless tobacco. She reports that she does not currently use drugs after having used the following drugs: Marijuana. She reports that she does not drink alcohol.  Allergies  Allergen Reactions   Doxycycline Other (See Comments)    Elevates liver enzymes    Aspirin  Other (See Comments)    Can take 81 mg but has to be coated; No other Aspirin  due to bleeding in stools from ulcers   Nsaids Other (See Comments)    GI issues    Gluten Meal Other (See Comments)    Avoid due to autoimmune issues    Lactose Other (See Comments)    Avoid due to autoimmune issues    Oxycodone  Nausea And  Vomiting   Sulfa Antibiotics Nausea And Vomiting    Family History  Problem Relation Age of Onset   Multiple sclerosis Father    Colon cancer Neg Hx    Stomach cancer Neg Hx    Colon polyps Neg Hx    Esophageal cancer Neg Hx    Rectal cancer Neg Hx     Prior to Admission medications   Medication Sig Start Date End Date Taking? Authorizing Provider  Cholecalciferol  (VITAMIN D3) 125 MCG (5000 UT) capsule Take 5,000 Units by mouth daily.   Yes [provider]  Cyanocobalamin  (B-12) 3000 MCG CAPS Take 3,000 mcg by mouth daily.   Yes [provider]  Dexlansoprazole  (DEXILANT ) 30 MG capsule DR Take 1 capsule (30 mg total) by mouth daily. 04/11/24  Yes Dean Clarity, MD  diphenhydrAMINE  HCl (  BENADRYL  ALLERGY PO) Take 25 mg by mouth at bedtime.   Yes [provider]  fluticasone -salmeterol (ADVAIR  HFA) 115-21 MCG/ACT inhaler Inhale 2 puffs into the lungs 2 (two) times daily. 05/04/24  Yes Pickenpack-Cousar, Fannie SAILOR, NP  Insulin  lispro (HUMALOG JUNIOR KWIKPEN) 100 UNIT/ML Inject 1-15 Units into the skin 3 (three) times daily as needed (high blood sugar). Insulin  to carb ratio 1:13   Yes [provider]  Ketotifen  Fumarate ([employer] ALLERGY EYE DROPS OP) Place 1 drop into both eyes 2 (two) times daily.   Yes [provider]  levothyroxine  (SYNTHROID ) 200 MCG tablet Take 1 tablet (200 mcg total) by mouth daily at 6 (six) AM. 04/11/24 05/11/24 Yes Cindy Garnette POUR, MD  linaclotide  (LINZESS ) 145 MCG CAPS capsule Take 1 capsule (145 mcg total) by mouth daily before breakfast. Patient taking differently: Take 145-290 mcg by mouth daily before breakfast. 1 cap alternating with 2 caps ever other day 11/14/23  Yes Federico Rosario BROCKS, MD  Melatonin 10-10 MG TBCR Take 10 mg by mouth at bedtime.   Yes [provider]  morphine  (MS CONTIN ) 30 MG 12 hr tablet Take 1 tablet (30 mg total) by mouth every 12 (twelve) hours. 05/04/24  Yes Pickenpack-Cousar, Fannie SAILOR, NP   morphine  (MSIR) 15 MG tablet Take 1 tablet (15 mg total) by mouth every 6 (six) hours as needed for severe pain (pain score 7-10). 05/04/24  Yes Pickenpack-Cousar, Fannie SAILOR, NP  Multiple Vitamins-Minerals (MULTIVITAMINS THER. W/MINERALS) TABS Take 1 tablet by mouth daily.   Yes [provider]  ondansetron  (ZOFRAN -ODT) 4 MG disintegrating tablet Take 1 tablet (4 mg total) by mouth every 6 (six) hours as needed for nausea or vomiting. 05/04/24  Yes Pickenpack-Cousar, Fannie SAILOR, NP  polyethylene glycol powder (GLYCOLAX /MIRALAX ) 17 GM/SCOOP powder Take 17 g by mouth 2 (two) times daily. Patient taking differently: Take 17 g by mouth daily. 04/10/24  Yes Cindy Garnette POUR, MD  predniSONE  (DELTASONE ) 5 MG tablet Take 1 tablet (5 mg total) by mouth 2 (two) times daily. 12/25/23  Yes Odell Celinda Balo, MD  promethazine  (PHENERGAN ) 25 MG suppository Place 1 suppository (25 mg total) rectally every 6 (six) hours as needed for nausea or vomiting. 05/04/24  Yes Pickenpack-Cousar, Fannie SAILOR, NP  vitamin E  180 MG (400 UNITS) capsule Take 400 Units by mouth at bedtime.   Yes [provider]  acetaminophen  (TYLENOL ) 500 MG tablet Take 1,000 mg by mouth every 6 (six) hours as needed for moderate pain.    [provider]  Continuous Glucose Sensor (DEXCOM G7 SENSOR) MISC Inject 1 applicator into the skin See admin instructions. Every 10 days Patient not taking: Reported on 04/25/2024 11/27/23   [provider]  diclofenac  Sodium (VOLTAREN ) 1 % GEL Apply 2 g topically 4 (four) times daily. Patient taking differently: Apply 2 g topically 4 (four) times daily as needed (pain). 12/17/19   Darr, Jacob, PA-C  insulin  degludec (TRESIBA FLEXTOUCH) 100 UNIT/ML FlexTouch Pen Inject into the skin daily. Patient not taking: Reported on 04/25/2024    [provider]  insulin  detemir (LEVEMIR  FLEXTOUCH) 100 UNIT/ML FlexPen Inject 5 Units into the skin 2 (two) times daily. Patient taking  differently: Inject 8 Units into the skin 2 (two) times daily. 04/10/24   Cindy Garnette POUR, MD  methylphenidate  36 MG PO CR tablet Take 36 mg by mouth every morning.    [provider]  naloxone  (NARCAN ) nasal spray 4 mg/0.1 mL Place 1 spray into the  nose as needed. 04/28/24   Pokhrel, Laxman, MD  Polyethyl Glycol-Propyl Glycol (SYSTANE OP) Place 1 drop into both eyes 3 (three) times daily as needed (dry eyes).    [provider]    Physical Exam: Vitals:   05/02/24 0145 05/02/24 0200 05/02/24 0235 05/02/24 0240  BP: 118/70 123/63  123/81  Pulse: (!) 58 (!) 59  63  Resp:    18  Temp:    98.1 F (36.7 C)  TempSrc:    Oral  SpO2: 92% 99%  95%  Weight:   59 kg   Height:   5' 7.99 (1.727 m)     Constitutional: NAD, calm, comfortable Vitals:   05/02/24 0145 05/02/24 0200 05/02/24 0235 05/02/24 0240  BP: 118/70 123/63  123/81  Pulse: (!) 58 (!) 59  63  Resp:    18  Temp:    98.1 F (36.7 C)  TempSrc:    Oral  SpO2: 92% 99%  95%  Weight:   59 kg   Height:   5' 7.99 (1.727 m)    Eyes: PERRL, lids and conjunctivae normal ENMT: Mucous membranes are moist. Posterior pharynx clear of any exudate or lesions.Normal dentition.  Neck: normal, supple, no masses, no thyromegaly Respiratory: occasional rhonchi on right, no wheezing, no crackles. Normal respiratory effort. No accessory muscle use.  Cardiovascular: Regular rate and rhythm, no murmurs / rubs / gallops. No extremity edema. 2+ pedal pulses.  Abdomen: no tenderness, no masses palpated. No hepatosplenomegaly. Bowel sounds positive.  Musculoskeletal: no clubbing / cyanosis. No joint deformity upper and lower extremities. Good ROM, no contractures. Normal muscle tone.  Skin: no rashes, lesions, ulcers. No induration Neurologic: CN 2-12 grossly intact.  Strength 5/5 in all 4.  Psychiatric: Normal judgment and insight. Alert and oriented x 3. Normal mood.    Labs on Admission: I have personally reviewed following labs  and imaging studies  CBC: Recent Labs  Lab 04/25/24 0607 04/25/24 0932 04/26/24 0655 05/01/24 2208  WBC 18.5* 20.2* 14.9* 17.4*  NEUTROABS  --   --   --  14.7*  HGB 10.1* 10.1* 11.1* 10.9*  HCT 31.7* 32.6* 37.7 33.9*  MCV 87.3 87.6 91.5 84.5  PLT 408* 419* 248 529*   Basic Metabolic Panel: Recent Labs  Lab 04/25/24 0932 04/26/24 0655 05/01/24 2208  NA 131* 131* 128*  K 3.9 4.1 4.6  CL 97* 96* 89*  CO2 26 27 29   GLUCOSE 89 132* 152*  BUN 17 14 9   CREATININE 0.74 0.73 0.74  CALCIUM  8.6* 8.4* 9.6  MG 2.3 2.5*  --   PHOS 2.2* 2.7  --    GFR: Estimated Creatinine Clearance: 71.4 mL/min (by C-G formula based on SCr of 0.74 mg/dL). Liver Function Tests: Recent Labs  Lab 04/25/24 0932 04/26/24 0655 05/01/24 2208  AST 27 36 53*  ALT 18 20 27   ALKPHOS 107 110 147*  BILITOT 0.5 0.6 0.4  PROT 6.8 6.6 7.6  ALBUMIN 2.9* 3.0* 3.8   Recent Labs  Lab 05/01/24 2208  LIPASE <10*   No results for input(s): AMMONIA in the last 168 hours. Coagulation Profile: No results for input(s): INR, PROTIME in the last 168 hours. Cardiac Enzymes: No results for input(s): CKTOTAL, CKMB, CKMBINDEX, TROPONINI in the last 168 hours. BNP (last 3 results) No results for input(s): PROBNP in the last 8760 hours. HbA1C: No results for input(s): HGBA1C in the last 72 hours. CBG: Recent Labs  Lab 04/27/24 1217 04/27/24 1636 04/27/24 2117 04/28/24  9276 05/02/24 0316  GLUCAP 109* 171* 80 177* 171*   Lipid Profile: No results for input(s): CHOL, HDL, LDLCALC, TRIG, CHOLHDL, LDLDIRECT in the last 72 hours. Thyroid  Function Tests: No results for input(s): TSH, T4TOTAL, FREET4, T3FREE, THYROIDAB in the last 72 hours. Anemia Panel: No results for input(s): VITAMINB12, FOLATE, FERRITIN, TIBC, IRON, RETICCTPCT in the last 72 hours. Urine analysis:    Component Value Date/Time   COLORURINE COLORLESS (A) 05/01/2024 2208   APPEARANCEUR  CLEAR 05/01/2024 2208   LABSPEC <1.005 (L) 05/01/2024 2208   PHURINE 8.0 05/01/2024 2208   GLUCOSEU NEGATIVE 05/01/2024 2208   HGBUR NEGATIVE 05/01/2024 2208   BILIRUBINUR NEGATIVE 05/01/2024 2208   BILIRUBINUR negative 12/22/2023 1234   KETONESUR NEGATIVE 05/01/2024 2208   PROTEINUR NEGATIVE 05/01/2024 2208   UROBILINOGEN 0.2 12/22/2023 1234   UROBILINOGEN 0.2 11/21/2022 1337   NITRITE NEGATIVE 05/01/2024 2208   LEUKOCYTESUR NEGATIVE 05/01/2024 2208    Radiological Exams on Admission: No results found.  EKG: Independently reviewed. N/a  Assessment/Plan  Advanced lung Cancer with refractory intractable cancer related pain  -admit to med/surg -patient is currently followed by palliative care  - patient currently contemplating palliative radiation treatment if this will assist with pain control   -will continue with iv pain medication  -will increase morphine  SR to45 mg bid  and continue with IR,continue with fentanyl  , titrate as needed  -oncology consult  placed  -continue home  bowel regimen  -nebs standing and prn  -resume Breo  Rheumatoid arthritis  Sjogren disease Continue prednisone , and pain medication.   Type 1 diabetes melitis -resume home insulin  regimen with Lantus      Leukocytosis -Secondary to cancer  -No current signs of infection    ADHD -Resume on discharge methylphenidate  at home.   Hypothyroidism -Continue Synthroid    Hyperlipidemia  -resume home regimen    Internal carotid aneurysm -Leading to left eye blindness   Migraine headaches -no active issues   Hx  Seizures -no active issues /remote quiescent no need for AED   IBS Chronic constipation -resume On Linzess /bowel regimen     Neurogenic bladder  -on self-catheterization at home  DVT prophylaxis: heparin  Code Status: DNRl/ as discussed per patient wishes in event of cardiac arrest  Family Communication: none at bedside Disposition Plan: patient  expected to be admitted  greater than 2 midnights  Consults called: oncology Admission status: med/surg   Camila DELENA Ned MD [facility]   If 7PM-7AM, please contact night-coverage [email or web address] Password [email or web address]  05/02/2024, 4:04 AM

## 2024-05-02 NOTE — Plan of Care (Signed)
  Problem: Activity: Goal: Risk for activity intolerance will decrease Outcome: Progressing   Problem: Nutrition: Goal: Adequate nutrition will be maintained Outcome: Progressing   Problem: Safety: Goal: Ability to remain free from injury will improve Outcome: Progressing   Problem: Skin Integrity: Goal: Risk for impaired skin integrity will decrease Outcome: Progressing   Problem: Education: Goal: Individualized Educational Video(s) Outcome: Progressing   Problem: Coping: Goal: Ability to adjust to condition or change in health will improve Outcome: Progressing   Problem: Health Behavior/Discharge Planning: Goal: Ability to identify and utilize available resources and services will improve Outcome: Progressing Goal: Ability to manage health-related needs will improve Outcome: Progressing   Problem: Nutritional: Goal: Progress toward achieving an optimal weight will improve Outcome: Progressing   Problem: Tissue Perfusion: Goal: Adequacy of tissue perfusion will improve Outcome: Progressing

## 2024-05-02 NOTE — Progress Notes (Signed)
 Plan of Care Note for accepted transfer   Patient: Mary Erickson MRN: [id number]   DOA: 05/01/2024  Facility requesting transfer: [facility] Requesting Provider: Tegeler, Lonni PARAS, MD   Reason for transfer: Posterior chest wall pain with lung cancer.    History facility course: Mary Erickson is a 58 y.o. female.     The history is provided by the patient and medical records. No language interpreter was used.  Illness Location: Posterior chest wall/right scapular pain Severity:  Severe Onset quality:  Gradual Duration:  4 days Timing:  Constant Progression:  Waxing and waning Chronicity:  Chronic Associated symptoms: fatigue, nausea and vomiting   Associated symptoms: no abdominal pain, no chest pain, no congestion, no cough, no diarrhea, no fever, no headaches, no rash and no shortness of breath.  Upon presentation to the emergency room, vital signs were within normal. Labs revealed mild hyponatremia 128 and hypochloremia of 89  alk phos 147 and AST 53 with otherwise unremarkable CMP.  CBC showed leukocytosis 17.4 with neutrophilia and anemia as well as thrombocytosis.  UA was unremarkable.    Contact was made with oncologist Dr. Cloretta who is aware about the patient.  Plan of care: The patient is accepted for admission to Med-surg  unit, at [facility]..  The patient will be under the care and responsibility of the EDP until arrival to [facility]  Author: Madison DELENA Peaches, MD 05/02/2024  Check [email or web address] for on-call coverage.  Nursing staff, Please call [facility] Admits & Consults System-Wide number on Amion as soon as patient's arrival, so appropriate admitting provider can evaluate the pt.

## 2024-05-03 DIAGNOSIS — R0789 Other chest pain: Secondary | ICD-10-CM | POA: Diagnosis not present

## 2024-05-03 DIAGNOSIS — R110 Nausea: Secondary | ICD-10-CM | POA: Diagnosis not present

## 2024-05-03 DIAGNOSIS — Z515 Encounter for palliative care: Secondary | ICD-10-CM | POA: Diagnosis not present

## 2024-05-03 DIAGNOSIS — J189 Pneumonia, unspecified organism: Secondary | ICD-10-CM | POA: Diagnosis not present

## 2024-05-03 DIAGNOSIS — Z66 Do not resuscitate: Secondary | ICD-10-CM | POA: Diagnosis not present

## 2024-05-03 DIAGNOSIS — R4589 Other symptoms and signs involving emotional state: Secondary | ICD-10-CM | POA: Diagnosis not present

## 2024-05-03 DIAGNOSIS — C3491 Malignant neoplasm of unspecified part of right bronchus or lung: Secondary | ICD-10-CM | POA: Diagnosis not present

## 2024-05-03 LAB — BASIC METABOLIC PANEL WITH GFR
Anion gap: 10 (ref 5–15)
BUN: 9 mg/dL (ref 6–20)
CO2: 22 mmol/L (ref 22–32)
Calcium: 8.4 mg/dL — ABNORMAL LOW (ref 8.9–10.3)
Chloride: 98 mmol/L (ref 98–111)
Creatinine, Ser: 0.87 mg/dL (ref 0.44–1.00)
GFR, Estimated: >60 mL/min (ref >60)
Glucose, Bld: 127 mg/dL — ABNORMAL HIGH (ref 70–99)
Potassium: 4 mmol/L (ref 3.5–5.1)
Sodium: 130 mmol/L — ABNORMAL LOW (ref 135–145)

## 2024-05-03 LAB — CBC
HCT: 34.6 % — ABNORMAL LOW (ref 36.0–46.0)
Hemoglobin: 10.7 g/dL — ABNORMAL LOW (ref 12.0–15.0)
MCH: 26.8 pg (ref 26.0–34.0)
MCHC: 30.9 g/dL (ref 30.0–36.0)
MCV: 86.7 fL (ref 80.0–100.0)
Platelets: 487 10*3/uL — ABNORMAL HIGH (ref 150–400)
RBC: 3.99 MIL/uL (ref 3.87–5.11)
RDW: 16.2 % — ABNORMAL HIGH (ref 11.5–15.5)
WBC: 17.3 10*3/uL — ABNORMAL HIGH (ref 4.0–10.5)
nRBC: 0 % (ref 0.0–0.2)

## 2024-05-03 LAB — GLUCOSE, CAPILLARY
Glucose-Capillary: 127 mg/dL — ABNORMAL HIGH (ref 70–99)
Glucose-Capillary: 100 mg/dL — ABNORMAL HIGH (ref 70–99)
Glucose-Capillary: 78 mg/dL (ref 70–99)
Glucose-Capillary: 118 mg/dL — ABNORMAL HIGH (ref 70–99)

## 2024-05-03 MED ORDER — CARMEX CLASSIC LIP BALM EX OINT
TOPICAL_OINTMENT | CUTANEOUS | Status: DC | PRN
  Filled 2024-05-03: qty 10

## 2024-05-03 MED ORDER — LEVOFLOXACIN 750 MG PO TABS
750.0000 mg | ORAL_TABLET | Freq: Every day | ORAL | Status: AC
  Administered 2024-05-03 – 2024-05-07 (×5): 750 mg via ORAL
  Filled 2024-05-03 (×5): qty 1

## 2024-05-03 MED ORDER — HYDROMORPHONE 1 MG/ML IV SOLN
INTRAVENOUS | Status: DC
  Administered 2024-05-03: 4.4 mg via INTRAVENOUS
  Administered 2024-05-03: 30 mg via INTRAVENOUS
  Administered 2024-05-04: 1.2 mg via INTRAVENOUS
  Filled 2024-05-03: qty 30

## 2024-05-03 MED ORDER — FENTANYL 25 MCG/HR TD PT72
1.0000 | MEDICATED_PATCH | TRANSDERMAL | Status: DC
  Administered 2024-05-03: 1 via TRANSDERMAL
  Filled 2024-05-03: qty 1

## 2024-05-03 NOTE — Progress Notes (Signed)
 PROGRESS NOTE    Mary Erickson  FMW:[id number] DOB: [birth year] DOA: 05/01/2024 PCP: Celestia Harder, NP   Brief Narrative: Mary Erickson is a 58 y.o. female with a history of non-small cell lung cancer, rheumatoid arthritis, Sjogren's disease, diabetes mellitus type, hyperlipidemia, internal carotid artery aneurysm, left eye blindness, migraine, seizures, IBS, neurogenic bladder, chronic constipation.  Patient presented secondary to intractable pain of her right chest related to her known cancer.  Patient started on IV analgesics for management.   Assessment and Plan:  Intractable chest pain Intractable cancer-related pain Patient is followed by hematology/oncology in addition to palliative care for pain management.  Patient is on MS Contin  and morphine  immediate release as an outpatient with inadequate control of pain.  On admission patient's regimen was adjusted in addition to adding Dilaudid  IV as needed.  Palliative care consulted for inpatient management of pain.  Patient is considering radiation therapy for palliative management. - Palliative Care recommendations: Patient transition to a Dilaudid  PCA pump; Dilaudid  IV as needed for breakthrough  Nausea and vomiting Possibly related to narcotic medications versus underlying disease process. - Continue Zofran  and Phenergan  as needed  Non-small cell lung cancer of the right lung Patient follows with Dr. Gatha as an outpatient, in addition to pulmonology.  Patient also follows with palliative care. -Will consult radiation oncology in AM for consideration of radiation therapy  Possible post-obstructive pneumonia Chest x-ray suggests possible pneumonia versus atelectasis. Patient has an associated leukocytosis. -Levaquin 750 mg daily x5 days  Rheumatoid arthritis Sjogren's disease Patient is on prednisone  as outpatient.  Continued on admission. - Continue prednisone   Diabetes mellitus type 1 Well-controlled based  on last hemoglobin A1c of 6.9%.  Patient is managed degludec and insulin  was as an outpatient.  Medications not restarted on admission. - Continue insulin  glargine 7 units twice daily - Sliding scale insulin   Hyponatremia Likely secondary to poor oral intake. Started on IV fluids with improvement. Urine studies no not suggest dehydration, though, and rather possible SIADH.  ADHD Noted.  Hypothyroidism - Continue levothyroxine  200 mcg daily  History of migraines Noted.  Asymptomatic.  History of seizures Patient is not on antiepileptic therapy.  Irritable bowel syndrome Chronic constipation - Continue MiraLAX   Neurogenic bladder Patient self catheterizes    DVT prophylaxis: Lovenox  Code Status:   Code Status: Limited: Do not attempt resuscitation (DNR) -DNR-LIMITED -Do Not Intubate/DNI  Family Communication: None at bedside Disposition Plan: Discharge home likely in several days pending ability to improve pain management in addition to outpatient analgesic regimen   Consultants:  Palliative care medicine Hematology/oncology  Procedures:  None  Antimicrobials: None   Subjective: Pain has been better controlled on PCA.  Objective: BP 107/63 (BP Location: Right Arm)   Pulse 63   Temp 98.4 F (36.9 C) (Oral)   Resp 11   Ht 5' 7.99 (1.727 m)   Wt 59 kg   SpO2 95%   BMI 19.79 kg/m   Examination:  General exam: Appears calm and comfortable Respiratory system: Clear to auscultation. Respiratory effort normal. Cardiovascular system: S1 & S2 heard, RRR. No murmurs, rubs, gallops or clicks. Gastrointestinal system: Abdomen is nondistended, soft and nontender. No organomegaly or masses felt. Normal bowel sounds heard. Central nervous system: Alert and oriented, however falls asleep easily. Musculoskeletal: No edema. No calf tenderness Skin: No cyanosis. No rashes Psychiatry: Judgement and insight appear normal. Mood & affect appropriate.     Data Reviewed: I  have personally reviewed following labs and imaging  studies  CBC Lab Results  Component Value Date   WBC 17.3 (H) 05/03/2024   RBC 3.99 05/03/2024   HGB 10.7 (L) 05/03/2024   HCT 34.6 (L) 05/03/2024   MCV 86.7 05/03/2024   MCH 26.8 05/03/2024   PLT 487 (H) 05/03/2024   MCHC 30.9 05/03/2024   RDW 16.2 (H) 05/03/2024   LYMPHSABS 1.2 05/01/2024   MONOABS 1.2 (H) 05/01/2024   EOSABS 0.1 05/01/2024   BASOSABS 0.1 05/01/2024     Last metabolic panel Lab Results  Component Value Date   NA 130 (L) 05/03/2024   K 4.0 05/03/2024   CL 98 05/03/2024   CO2 22 05/03/2024   BUN 9 05/03/2024   CREATININE 0.87 05/03/2024   GLUCOSE 127 (H) 05/03/2024   GFRNONAA >60 05/03/2024   GFRAA >60 03/18/2020   CALCIUM  8.4 (L) 05/03/2024   PHOS 2.7 04/26/2024   PROT 7.6 05/01/2024   ALBUMIN 3.8 05/01/2024   LABGLOB 4.4 (H) 12/08/2023   AGRATIO 0.8 12/08/2023   BILITOT 0.4 05/01/2024   ALKPHOS 147 (H) 05/01/2024   AST 53 (H) 05/01/2024   ALT 27 05/01/2024   ANIONGAP 10 05/03/2024    GFR: Estimated Creatinine Clearance: 65.6 mL/min (by C-G formula based on SCr of 0.87 mg/dL).  No results found for this or any previous visit (from the past 240 hours).    Radiology Studies: DG Chest 2 View Result Date: 05/02/2024 CLINICAL DATA:  Cough EXAM: CHEST - 2 VIEW COMPARISON:  Chest radiograph dated 04/24/2024 FINDINGS: Normal lung volumes. Similar appearance of known right apical mass. Interval increased density of the right upper lobe. Left basilar linear opacities. No pleural effusion or pneumothorax. The heart size and mediastinal contours are within normal limits. No acute osseous abnormality. Surgical clips project over the neck. IMPRESSION: 1. Similar appearance of known right apical mass. Interval increased density of the right upper lobe, which may represent postobstructive atelectasis or pneumonia. 2. Left basilar linear opacities, likely atelectasis. Electronically Signed   By: Limin  Xu M.D.    On: 05/02/2024 16:53      LOS: 1 day    Elgin Lam, MD [facility] 05/03/2024, 12:06 PM   If 7PM-7AM, please contact night-coverage [email or web address]

## 2024-05-03 NOTE — Progress Notes (Signed)
   05/03/24 1046  TOC Brief Assessment  Insurance and Status Reviewed  Patient has primary care physician Yes  Home environment has been reviewed home alone  Prior level of function: independent  Prior/Current Home Services No current home services  Social Drivers of Health Review SDOH reviewed no interventions necessary  Readmission risk has been reviewed Yes  Transition of care needs transition of care needs identified, TOC will continue to follow

## 2024-05-03 NOTE — Progress Notes (Signed)
 Darryle Long [phone] [facility] Liaison note:   This patient is currently enrolled in [facility] outpatient-based palliative care. Referral received for hospice at home following discharge. Will follow up with patient on tomorrow after decision made regarding Palliative Radiation.  Hospital Liaison will continue to follow for discharge disposition.   Please call for any outpatient based palliative care related questions or concerns.   Thank you,   Nat Babe, BSN, [facility] [phone]

## 2024-05-03 NOTE — Progress Notes (Signed)
 Daily Progress Note   Patient Name: Mary Erickson       Date: 05/03/2024 DOB: 01/19/1966  Age: 58 y.o. MRN#: [id number] Attending Physician: Briana Elgin LABOR, MD Primary Care Physician: Celestia Harder, NP Admit Date: 05/01/2024 Length of Stay: 1 day  Reason for Consultation/Follow-up: Establishing goals of care and Pain control  Subjective:   CC: Patient noting having PCA has helped with managing her pain.  Following up regarding complex medical decision making and symptom management.  Subjective:  Reviewed EMR prior to presenting to bedside.  At time of EMR review in past 24 hours patient has received as needed IV Dilaudid  via PCA 0.3 mg x 46 boluses and as needed IV Dilaudid  1 mg every 3 hours for breakthrough x 5 doses.  Patient has received as needed Ativan  0.5 mg x 1 dose, as needed Zofran  4 mg x 3 doses, and as needed Phenergan  25 mg x 3 doses. Reviewed recent BMP noting BUN 9, creatinine 0.87, and GFR > 60.   Presented to bedside to see patient.  Patient lying in bed.  No visitors present at bedside.  Able to discuss patient's symptom burden at this time.  Patient feels that having the PCA has greatly helped with managing her pain when it occurs.  Discussed based on patient's OME requirements, recommend to start long-acting medication.  Patient deals with nausea so discussed starting patient on fentanyl  patch.  Patient noted she had a fentanyl  patch previously and did not have any issues with insurance coverage.  Acknowledges this.  Noted would start patient on fentanyl  patch 25 mcg every 72 hours today.  Noted will take time to get into steady state for patient.  Will continue PCA to allow for short acting pain management at this time.  Patient feels that the PCA dose can be slightly increased to help with management.  Agreed with this and noted would slightly increase dose to 0.4 mg every 10 minute as needed.  Asked patient to inform if needs to be further adjusted.  Discussed plan  would be to eventually get patient onto oral short acting medications.  Patient would prefer liquid medication as it is easier for her to take.  Noted could transition to oral liquid Dilaudid  once time came.  Patient agreed with this plan.  ------------------------------------------------------------------------------------------------------------- Advance Care Planning Conversation  Pertinent diagnosis: Non-small cell lung cancer with progression not seeking cancer directed therapies, cancer related pain requiring multiple [facility] hospitalizations, seizures, RA, Sjogren's disease, diabetes  The patient and/or family consented to a voluntary Advance Care Planning Conversation in person. Individuals present for the conversation:  Summary of the conversation:   Discussed care planning with patient moving forward.  Reviewed MOST form with patient.  Patient again confirmed DNR/DNI status.  Patient does not like having to represent to the hospital, though requires the need to do so due to her worsening cancer pain.  Patient had been evaluated by hospice earlier this year and was deemed not appropriate at this time.  Since that time, patient has had multiple hospitalizations/ER presentations with progression of disease.  Patient is still not seeking cancer directed therapies.  Discussed with patient and at this time we will place a referral to home hospice for evaluation again.  Patient specifically requesting [facility] hospice.  Discussed that if patient is excepted to home hospice, does not want antibiotics unless deemed appropriate for symptom management, would want to be allowed to focus on comfort care, does not want IV fluids unless for comfort (  as per MOST form), and would not want a feeding tube.  Noted will plan to complete MOST form once determination about possible hospice support at home.  Patient agreeing with this plan.  Outcome of the conversations and/or documents completed:  Placed referral  for patient to be evaluated by [facility] hospice to consider going home with their support.  Will plan to complete MOST form once determination made about hospice.  I spent 25 minutes providing separately identifiable ACP services with the patient and/or surrogate decision maker in a voluntary, in-person conversation discussing the patient's wishes and goals as detailed in the above note.  Tinnie Radar, DO Palliative Medicine Provider  -------------------------------------------------------------------------------------------------------------  Discussed care with hospitalist, RN, TOC, and [facility] liaison to coordinate care.  Review of Systems PCA has assisted with pain management Objective:   Vital Signs:  BP 107/63 (BP Location: Right Arm)   Pulse 63   Temp 98.4 F (36.9 C) (Oral)   Resp 11   Ht 5' 7.99 (1.727 m)   Wt 59 kg   SpO2 95%   BMI 19.79 kg/m   Physical Exam: General: NAD, alert, chronically ill-appearing, frail, cachectic Cardiovascular: RRR Respiratory: no increased work of breathing noted, not in respiratory distress Abdomen: not distended Extremities: Muscle wasting present in all extremities Neuro: A&Ox4, following commands easily Psych: appropriately answers all questions  Assessment & Plan:   Assessment: Patient is a 58 year old female with a past medical history of non-small cell lung cancer-has stated wishes to not receive cancer directed therapies, rheumatoid arthritis, Sjogren's disease, diabetes mellitus, hyperlipidemia, internal carotid artery aneurysm, left eye blindness, migraines, seizures, IBS, and neurogenic bladder requiring self-catheterization who was admitted on 05/01/2024 for management of worsening cancer pain.  Palliative medicine team consulted to assist with symptom management. Of note patient is known to palliative medicine team and follows up with palliative medicine team in outpatient setting.  Recommendations/Plan: # Complex medical decision  making/goals of care:     - Discussed care with patient as detailed above in HPI.  Patient has cancer progression on imaging last month and does not want to pursue cancer directed therapies.  Patient is willing to consider radiation only for pain management if pain cannot be managed by oral medications.  Continue to adjust medications to assist with this.  Patient agrees that she would want hospice support at home.  Have consulted TOC for [facility] hospice at home evaluation.  Patient specifically requested [facility] for evaluation.  Discussed MOST form with patient.  If patient can go home with hospice, would like to complete MOST form to aim on comfort measures only.  Palliative medicine team will engage in conversations moving forward as able and appropriate.                -  Code Status: Limited: Do not attempt resuscitation (DNR) -DNR-LIMITED -Do Not Intubate/DNI                      # Symptom management Patient is receiving these palliative interventions for symptom management with an intent to improve quality of life.                 - Pain, severe acute on chronic pain in the setting of non-small cell lung cancer Within the past 24 hours hours, patient has required 18.8 mg of IV Dilaudid   for opioid management. Based on OMEs calculated for this dose, which would be approximately 117 OME when reducing by 50% for incomplete cross  tolerance, will appropriately start patient on the listed regimen below.                                - Increase IV Dilaudid  PCA to bolus dosing of 0.4 mg every 10 minute as needed.  Will not have continuous infusion dosing at this time.  Continue to adjust based on patient's symptom response.                               - Continue IV Dilaudid  1 mg every 3 hours as needed to breakthrough after PCA                               - Start fentanyl  patch 25 mcg every 72 hours                               -Consider use of dexamethasone  to help with pain management.  Patient chronically  on prednisone  5 mg twice daily for RA.                  - Nausea/vomiting Associated with pain and anxiety.  EKG on 04/28/2024 noting QTc 467.                               - Consider repeat EKG if receiving multiple QTc prolonging medications to monitor.                               - Receiving Phenergan  25 mg every 6 hours as needed                               - Receiving Zofran  4 mg every 6 hours as needed                               - Continue IV Ativan  0.5 mg every 4 hours as needed                                - Constipation Patient noted last BM 05/01/2024 though was small.                               - Continue MiraLAX  17 g daily                               - Continue senna 1 tab twice daily.  Continue to adjust based on symptom response.  # Discharge Planning: To Be Determined  - Consulted TOC to assist with referral for [facility] home hospice evaluation.  Discussed with: Patient, hospitalist, RN, [facility] liaison, TOC  Thank you for allowing the palliative care team to participate in the care Nicie R Bobby.  Tinnie Radar, DO Palliative Care Provider PMT # [phone]  If patient remains symptomatic despite maximum doses, please call PMT at [phone] between 0700 and 1900.  Outside of these hours, please call attending, as PMT does not have night coverage.

## 2024-05-03 NOTE — Plan of Care (Signed)

## 2024-05-03 NOTE — Plan of Care (Signed)
  Problem: Coping: Goal: Level of anxiety will decrease Outcome: Progressing   Problem: Pain Managment: Goal: General experience of comfort will improve and/or be controlled Outcome: Progressing   Problem: Skin Integrity: Goal: Risk for impaired skin integrity will decrease Outcome: Progressing   Problem: Coping: Goal: Ability to adjust to condition or change in health will improve Outcome: Progressing   Problem: Nutritional: Goal: Progress toward achieving an optimal weight will improve Outcome: Progressing   Problem: Tissue Perfusion: Goal: Adequacy of tissue perfusion will improve Outcome: Progressing

## 2024-05-04 DIAGNOSIS — D649 Anemia, unspecified: Secondary | ICD-10-CM

## 2024-05-04 DIAGNOSIS — R0789 Other chest pain: Secondary | ICD-10-CM | POA: Diagnosis not present

## 2024-05-04 DIAGNOSIS — Z515 Encounter for palliative care: Secondary | ICD-10-CM | POA: Diagnosis not present

## 2024-05-04 DIAGNOSIS — G893 Neoplasm related pain (acute) (chronic): Secondary | ICD-10-CM | POA: Diagnosis not present

## 2024-05-04 DIAGNOSIS — R110 Nausea: Secondary | ICD-10-CM | POA: Diagnosis not present

## 2024-05-04 DIAGNOSIS — D75839 Thrombocytosis, unspecified: Secondary | ICD-10-CM | POA: Diagnosis not present

## 2024-05-04 DIAGNOSIS — C3491 Malignant neoplasm of unspecified part of right bronchus or lung: Secondary | ICD-10-CM | POA: Diagnosis not present

## 2024-05-04 DIAGNOSIS — Z79899 Other long term (current) drug therapy: Secondary | ICD-10-CM | POA: Diagnosis not present

## 2024-05-04 LAB — BASIC METABOLIC PANEL WITH GFR
Anion gap: 10 (ref 5–15)
BUN: 10 mg/dL (ref 6–20)
CO2: 25 mmol/L (ref 22–32)
Calcium: 8.7 mg/dL — ABNORMAL LOW (ref 8.9–10.3)
Chloride: 94 mmol/L — ABNORMAL LOW (ref 98–111)
Creatinine, Ser: 0.71 mg/dL (ref 0.44–1.00)
GFR, Estimated: >60 mL/min (ref >60)
Glucose, Bld: 54 mg/dL — ABNORMAL LOW (ref 70–99)
Potassium: 4 mmol/L (ref 3.5–5.1)
Sodium: 129 mmol/L — ABNORMAL LOW (ref 135–145)

## 2024-05-04 LAB — CBC
HCT: 34.6 % — ABNORMAL LOW (ref 36.0–46.0)
Hemoglobin: 10.8 g/dL — ABNORMAL LOW (ref 12.0–15.0)
MCH: 27.3 pg (ref 26.0–34.0)
MCHC: 31.2 g/dL (ref 30.0–36.0)
MCV: 87.6 fL (ref 80.0–100.0)
Platelets: 518 10*3/uL — ABNORMAL HIGH (ref 150–400)
RBC: 3.95 MIL/uL (ref 3.87–5.11)
RDW: 16.2 % — ABNORMAL HIGH (ref 11.5–15.5)
WBC: 16 10*3/uL — ABNORMAL HIGH (ref 4.0–10.5)
nRBC: 0 % (ref 0.0–0.2)

## 2024-05-04 LAB — GLUCOSE, CAPILLARY
Glucose-Capillary: 50 mg/dL — ABNORMAL LOW (ref 70–99)
Glucose-Capillary: 67 mg/dL — ABNORMAL LOW (ref 70–99)
Glucose-Capillary: 139 mg/dL — ABNORMAL HIGH (ref 70–99)
Glucose-Capillary: 197 mg/dL — ABNORMAL HIGH (ref 70–99)
Glucose-Capillary: 136 mg/dL — ABNORMAL HIGH (ref 70–99)

## 2024-05-04 MED ORDER — HYDROMORPHONE HCL 2 MG PO TABS
1.0000 mg | ORAL_TABLET | ORAL | Status: DC | PRN
  Administered 2024-05-04: 2 mg via ORAL
  Filled 2024-05-04: qty 1

## 2024-05-04 MED ORDER — HYDROMORPHONE HCL 1 MG/ML PO LIQD
2.0000 mg | ORAL | Status: DC | PRN
  Administered 2024-05-04: 4 mg via ORAL
  Administered 2024-05-05: 3 mg via ORAL
  Administered 2024-05-05 – 2024-05-06 (×6): 4 mg via ORAL
  Filled 2024-05-04 (×4): qty 4
  Filled 2024-05-04: qty 2
  Filled 2024-05-04 (×2): qty 4
  Filled 2024-05-04: qty 3
  Filled 2024-05-04: qty 4
  Filled 2024-05-04: qty 2
  Filled 2024-05-04: qty 4

## 2024-05-04 MED ORDER — LORAZEPAM 2 MG/ML IJ SOLN
0.2500 mg | INTRAMUSCULAR | Status: DC | PRN
  Administered 2024-05-05 – 2024-05-07 (×5): 0.5 mg via INTRAVENOUS
  Administered 2024-05-08: 0.25 mg via INTRAVENOUS
  Filled 2024-05-04 (×6): qty 1

## 2024-05-04 MED ORDER — HYDROMORPHONE HCL 1 MG/ML IJ SOLN: 0.5000 mg | INTRAMUSCULAR | Status: DC | PRN

## 2024-05-04 MED ORDER — HYDROMORPHONE HCL 1 MG/ML IJ SOLN
0.5000 mg | INTRAMUSCULAR | Status: DC | PRN
  Administered 2024-05-04 – 2024-05-06 (×9): 1 mg via INTRAVENOUS
  Filled 2024-05-04 (×12): qty 1

## 2024-05-04 MED ORDER — INSULIN ASPART 100 UNIT/ML IJ SOLN
0.0000 [IU] | Freq: Three times a day (TID) | INTRAMUSCULAR | Status: DC
  Administered 2024-05-04 – 2024-05-05 (×2): 2 [IU] via SUBCUTANEOUS
  Administered 2024-05-05 – 2024-05-06 (×4): 1 [IU] via SUBCUTANEOUS
  Administered 2024-05-07 (×2): 2 [IU] via SUBCUTANEOUS

## 2024-05-04 MED ORDER — HYDROMORPHONE HCL 1 MG/ML PO LIQD
1.0000 mg | ORAL | Status: DC | PRN
  Administered 2024-05-04: 1 mg via ORAL
  Filled 2024-05-04 (×2): qty 1

## 2024-05-04 MED ORDER — INSULIN ASPART 100 UNIT/ML IJ SOLN
0.0000 [IU] | Freq: Every day | INTRAMUSCULAR | Status: DC
  Administered 2024-05-06: 3 [IU] via SUBCUTANEOUS

## 2024-05-04 MED ORDER — HYDROMORPHONE HCL 1 MG/ML PO LIQD: 1.0000 mg | ORAL | Status: DC | PRN

## 2024-05-04 MED ORDER — LORAZEPAM 2 MG/ML PO CONC
0.2500 mg | ORAL | Status: DC | PRN
  Administered 2024-05-04 – 2024-05-07 (×4): 0.5 mg via ORAL
  Filled 2024-05-04 (×4): qty 1

## 2024-05-04 MED ORDER — INSULIN GLARGINE-YFGN 100 UNIT/ML ~~LOC~~ SOLN
7.0000 [IU] | Freq: Every day | SUBCUTANEOUS | Status: DC
  Administered 2024-05-04: 7 [IU] via SUBCUTANEOUS
  Filled 2024-05-04: qty 0.07

## 2024-05-04 NOTE — Plan of Care (Signed)
 ?  Problem: Education: ?Goal: Knowledge of General Education information will improve ?Description: Including pain rating scale, medication(s)/side effects and non-pharmacologic comfort measures ?Outcome: Progressing ?  ?Problem: Health Behavior/Discharge Planning: ?Goal: Ability to manage health-related needs will improve ?Outcome: Progressing ?  ?Problem: Coping: ?Goal: Level of anxiety will decrease ?Outcome: Progressing ?  ?

## 2024-05-04 NOTE — Progress Notes (Addendum)
 Daily Progress Note   Patient Name: Mary Erickson       Date: 05/04/2024 DOB: 1966/02/06  Age: 58 y.o. MRN#: [id number] Attending Physician: Briana Elgin LABOR, MD Primary Care Physician: Celestia Harder, NP Admit Date: 05/01/2024 Length of Stay: 2 days  Reason for Consultation/Follow-up: Establishing goals of care and Pain control  Subjective:   CC: Patient sleepy this morning when seen.  Following up regarding complex medical decision making and symptom management.  Subjective:  Reviewed EMR prior to presenting to bedside.  At time of EMR review in past 24 hours patient has received as needed IV Dilaudid  PCA 0.4 mg bolus x 25 doses.  Patient had also received as needed IV Dilaudid  for breakthrough after PCA 1 mg x 7 doses.  Patient also received as needed Ativan  0.5 mg x 4 doses, Zofran  4 mg IV x 1 dose, and Phenergan  25 mg IV x 3 doses.  Presented to bedside to meet with patient.  Patient very sleepy when seen today.  Patient does feel her pain is better controlled.  With patient's pain better controlled on medications, would not recommend pursuing radiation at this time as patient does not want to have adverse effects from cancer directed therapies if she does not have to.  Can continue to adjust medications based on patient's symptom burden.  Will be transitioning to oral medications today and discontinuing PCA.  Should patient's symptoms continue to worsen despite medication adjustments, then could consider radiation.  Still recommend patient returns home with hospice support. Discussed care with RN after visit due to patient's lethargy.  RN noted patient had very recently received IV Dilaudid  and IV Phenergan  together to assist with symptom management.  Will adjust medications accordingly.  Discussed care with team including hospitalist, [facility] liaison, oncologist, RN, TOC, and outpatient PMT provider to coordinate care.  Patient had previously been denied from hospice.  [facility] hospice to  reevaluate at this time.  UPDATE: Contacted by team later in evening. Patient stating prn oral dilaudid  is not helping enough to manage pain. Have increased dose to oral dilaudid  solution 2-4mg  q 4hours prn.   Review of Systems PCA has assisted with pain management Objective:   Vital Signs:  BP 123/64 (BP Location: Left Arm)   Pulse 66   Temp 97.7 F (36.5 C)   Resp 14   Ht 5' 7.99 (1.727 m)   Wt 59 kg   SpO2 95%   BMI 19.79 kg/m   Physical Exam: General: Sleepy, chronically ill-appearing, frail, cachectic Cardiovascular: RRR Respiratory: no increased work of breathing noted, not in respiratory distress Abdomen: not distended Extremities: Muscle wasting present in all extremities Neuro: Sleepy  Assessment & Plan:   Assessment: Patient is a 58 year old female with a past medical history of non-small cell lung cancer-has stated wishes to not receive cancer directed therapies, rheumatoid arthritis, Sjogren's disease, diabetes mellitus, hyperlipidemia, internal carotid artery aneurysm, left eye blindness, migraines, seizures, IBS, and neurogenic bladder requiring self-catheterization who was admitted on 05/01/2024 for management of worsening cancer pain.  Palliative medicine team consulted to assist with symptom management. Of note patient is known to palliative medicine team and follows up with palliative medicine team in outpatient setting.  Recommendations/Plan: # Complex medical decision making/goals of care:     - Discussed care with patient as detailed above in HPI.  Patient has cancer progression on imaging last month and does not want to pursue cancer directed therapies.  Patient does not want to pursue radiation unless required  for worsening pain management; at this time pain managed by medications.  Patient agreeing with reevaluation by [facility] for home hospice support.  Had already reviewed most form with patient.  If patient can go home with hospice, would need to complete  MOST form to aim on comfort measures only.  Palliative medicine team will engage in conversations moving forward as able and appropriate.                -  Code Status: Limited: Do not attempt resuscitation (DNR) -DNR-LIMITED -Do Not Intubate/DNI                      # Symptom management Patient is receiving these palliative interventions for symptom management with an intent to improve quality of life.                 - Pain, severe acute on chronic pain in the setting of non-small cell lung cancer                               - Discontinue IV Dilaudid  PCA   - Start p.o. Dilaudid  1 mg solution every 4 hours as needed.  Can titrate up based on patient's response.  Patient specifically requested solution as this is much easier for her to take over pills.    -Increased in evening on 6/23 as patient start current dose not helping with pain. Increased dose to 2-4mg  q 4 hours prn. Based on requirements of prn, will likely need further up titration of fentanyl  patch.                                - Change IV Dilaudid  to 0.5- 1 mg every 3 hours as needed to breakthrough after oral short acting opioids                               - Continue fentanyl  patch 25 mcg every 72 hours                               -Consider use of dexamethasone  to help with pain management.  Patient chronically on prednisone  5 mg twice daily for RA.    - Not pursuing radiation at this time as pain being managed by medications.  If pain continues to worsen despite medications, could consider radiation involvement.                 - Nausea/vomiting Associated with pain and anxiety.  EKG on 04/28/2024 noting QTc 467.                               - Consider repeat EKG if receiving multiple QTc prolonging medications to monitor.                               - Discontinue Phenergan                                - Continue Zofran  4 mg every 6 hours as needed   - Start oral Ativan  solution 0.26-5 mg  every 4 hours as needed                                - Change IV Ativan  to 0.25-0.5 mg every 4 hours as needed                                - Constipation Patient noted last BM 05/01/2024 though was small.                               - Continue MiraLAX  17 g daily                               - Continue senna 1 tab twice daily.  Continue to adjust based on symptom response.  # Discharge Planning: To Be Determined  - TOC assisting with discharge coordination.  [facility] continuing to evaluate for possibility of home with hospice.  Discussed with: Patient, hospitalist, RN, [facility] liaison, TOC, outpatient PMT provider  Thank you for allowing the palliative care team to participate in the care Waylynn R Lasseigne.  Tinnie Radar, DO Palliative Care Provider PMT # [phone]  If patient remains symptomatic despite maximum doses, please call PMT at [phone] between 0700 and 1900. Outside of these hours, please call attending, as PMT does not have night coverage.  Billing based on MDM: High  Problems Addressed: One or more chronic illnesses with severe exacerbation, progression, or side effects of treatment.  Risks: Parenteral controlled substances

## 2024-05-04 NOTE — Progress Notes (Signed)
 Mary Erickson [phone] [facility] Liaison Note:  Met with patient to discuss current outpatient palliative and interest in having hospice services after discharge.   Patient is concerned about medication coverage for some medications she currently takes with hospice coverage.   Patient is very sleepy during our visit and is agreeable to follow up visit tomorrow.   Elouise Husband, BSN, RN, OCN [employer] [phone]

## 2024-05-04 NOTE — Progress Notes (Signed)
 Mary Erickson [place]   DOB:[birth year]   FM#:[id number]      ASSESSMENT & PLAN:  Mary Erickson is a 58 year old female patient with oncologic history significant for non-small cell lung cancer.  She was admitted on 05/02/2024 with intractable pain.  Follows with Dr. Sherrod.  Non-small cell lung cancer, right - Diagnosed following bronchoscopy December 10, 2023 - Seen in outpatient oncology clinic by Dr. Sherrod on 12/25/2023.  Further plans were made for outpatient PET scan and MRI brain for which patient no showed.   -Patient later expressed to palliative team (see palliative notes of 02/03/2024) that she has no desire to pursue further workup or oncologic interventions.  She wishes to focus on her quality of life while taking things 1 day at a time managing her symptoms. - CT chest done 04/08/2024 shows 12 x 12 cm centrally necrotic heterogeneous right upper lobe mass. - Noted radiation oncology consultation for assistance with pain management. - Appreciate palliative team follow-up - Hospice following and there are plans for home with hospice. - Medical oncology/Dr. Sherrod available as needed  Generalized weakness Cancer related pain Failure to thrive - Secondary to malignancy - Continue supportive care  Anemia, normocytic - Hemoglobin 10.8 today - No transfusional intervention required - Monitor CBC with differential periodically  Thrombocytosis - Likely reactive to anemia - Platelets 518 K - No intervention required  Rheumatoid arthritis Sjogren's Diabetes type 1 - Continue management per medicine    Code Status DNR-limited  Subjective:  Patient seen resting comfortably in bed.  Awakens to verbal stimuli however patient falls back asleep while talking.  Denies chest pain or other acute pain at this time.  No acute distress is noted.  Objective:   Intake/Output Summary (Last 24 hours) at 05/04/2024 1031 Last data filed at 05/04/2024 9388 Gross per 24 hour  Intake  472.4 ml  Output --  Net 472.4 ml     PHYSICAL EXAMINATION: ECOG PERFORMANCE STATUS: 2 - Symptomatic, <50% confined to bed  Vitals:   05/04/24 0748 05/04/24 0756  BP: 123/64   Pulse: 66   Resp: 16 14  Temp: 97.7 F (36.5 C)   SpO2: 95%    Filed Weights   05/01/24 1710 05/02/24 0235  Weight: 122 lb (55.3 kg) 130 lb 1.6 oz (59 kg)    GENERAL: + Somnolent +Frail appearing SKIN: + Pale skin color, texture, turgor are normal, no rashes or significant lesions EYES: normal, conjunctiva are pink and non-injected, sclera clear OROPHARYNX: no exudate, no erythema and lips, buccal mucosa, and tongue normal  NECK: supple, thyroid  normal size, non-tender, without nodularity LYMPH: no palpable lymphadenopathy in the cervical, axillary or inguinal LUNGS: clear to auscultation and percussion with normal breathing effort HEART: regular rate & rhythm and no murmurs and no lower extremity edema ABDOMEN: abdomen soft, non-tender and normal bowel sounds MUSCULOSKELETAL: no cyanosis of digits and no clubbing  PSYCH: + Somnolent    All questions were answered. The patient knows to call the clinic with any problems, questions or concerns.   The total time spent in the appointment was 40 minutes encounter with patient including review of chart and various tests results, discussions about plan of care and coordination of care plan  Olam JINNY Brunner, NP 05/04/2024 10:31 AM    Labs Reviewed:  Lab Results  Component Value Date   WBC 16.0 (H) 05/04/2024   HGB 10.8 (L) 05/04/2024   HCT 34.6 (L) 05/04/2024   MCV 87.6 05/04/2024   PLT 518 (H)  05/04/2024   Recent Labs    04/25/24 0932 04/26/24 0655 05/01/24 2208 05/02/24 1120 05/03/24 0826 05/04/24 0546  NA 131* 131* 128* 126* 130* 129*  K 3.9 4.1 4.6 4.0 4.0 4.0  CL 97* 96* 89* 91* 98 94*  CO2 26 27 29 27 22 25   GLUCOSE 89 132* 152* 154* 127* 54*  BUN 17 14 9 11 9 10   CREATININE 0.74 0.73 0.74 0.75 0.87 0.71  CALCIUM  8.6* 8.4* 9.6  8.3* 8.4* 8.7*  GFRNONAA >60 >60 >60 >60 >60 >60  PROT 6.8 6.6 7.6  --   --   --   ALBUMIN 2.9* 3.0* 3.8  --   --   --   AST 27 36 53*  --   --   --   ALT 18 20 27   --   --   --   ALKPHOS 107 110 147*  --   --   --   BILITOT 0.5 0.6 0.4  --   --   --     Studies Reviewed:  DG Chest 2 View Result Date: 05/02/2024 CLINICAL DATA:  Cough EXAM: CHEST - 2 VIEW COMPARISON:  Chest radiograph dated 04/24/2024 FINDINGS: Normal lung volumes. Similar appearance of known right apical mass. Interval increased density of the right upper lobe. Left basilar linear opacities. No pleural effusion or pneumothorax. The heart size and mediastinal contours are within normal limits. No acute osseous abnormality. Surgical clips project over the neck. IMPRESSION: 1. Similar appearance of known right apical mass. Interval increased density of the right upper lobe, which may represent postobstructive atelectasis or pneumonia. 2. Left basilar linear opacities, likely atelectasis. Electronically Signed   By: Limin  Xu M.D.   On: 05/02/2024 16:53   DG Chest Portable 1 View Result Date: 04/24/2024 CLINICAL DATA:  Known right lung tumor and increasing chest pain, initial encounter EXAM: PORTABLE CHEST 1 VIEW COMPARISON:  04/08/2024 CT FINDINGS: Cardiac shadow is mildly prominent. Left lung is clear. Large soft tissue mass is noted in the right mid and upper lung similar to that seen on prior CT. No new focal abnormality is noted. IMPRESSION: Stable right lung mass.  No new focal abnormality is noted. Electronically Signed   By: Oneil Devonshire M.D.   On: 04/24/2024 18:50   CT CHEST ABDOMEN PELVIS W CONTRAST Result Date: 04/08/2024 CLINICAL DATA:  concern for obstruction or hiatal hernia complication. EXAM: CT CHEST, ABDOMEN, AND PELVIS WITH CONTRAST TECHNIQUE: Multidetector CT imaging of the chest, abdomen and pelvis was performed following the standard protocol during bolus administration of intravenous contrast. RADIATION DOSE  REDUCTION: This exam was performed according to the departmental dose-optimization program which includes automated exposure control, adjustment of the mA and/or kV according to patient size and/or use of iterative reconstruction technique. CONTRAST:  OMNIPAQUE  IOHEXOL  300 MG/ML  SOLN COMPARISON:  None Available. FINDINGS: CT CHEST FINDINGS Cardiovascular: Normal heart size. No significant pericardial effusion. The thoracic aorta is normal in caliber. No atherosclerotic plaque of the thoracic aorta. No coronary artery calcifications. No central pulmonary embolus. Limited evaluation more distally due to artifact with question developing right upper lobe segmental pulmonary embolus. Mediastinum/Nodes: No enlarged mediastinal, hilar, or axillary lymph nodes. Thyroid  gland, trachea, and esophagus demonstrate no significant findings. Lungs/Pleura: Centrilobular emphysematous changes. No focal consolidation. No pulmonary nodule. Grossly stable in size of a 12 x 12 cm centrally necrotic heterogeneous right upper lobe mass. Similar-appearing surrounding ground-glass airspace opacities and interlobular septal thickening. Redemonstration of mass effect on  the right upper lobe and right mainstem bronchi. No pleural effusion. No pneumothorax. Musculoskeletal: No chest wall abnormality. No suspicious lytic or blastic osseous lesions. No acute displaced fracture. Multilevel degenerative changes of the spine. CT ABDOMEN PELVIS FINDINGS Hepatobiliary: A 3.7 x 3.4 cm right posterior hepatic lobe hepatic hemangioma. Several other pericentimeter hypodensities of the right posterior hepatic lobe suggestive of flash filling hemangiomas (2: 55, 60). Post cholecystectomy. No biliary dilatation. Pancreas: No focal lesion. Normal pancreatic contour. No surrounding inflammatory changes. No main pancreatic ductal dilatation. Spleen: Normal in size without focal abnormality. Adrenals/Urinary Tract: No adrenal nodule bilaterally.  Bilateral kidneys enhance symmetrically. No hydronephrosis. No hydroureter. The urinary bladder is unremarkable.+ On delayed imaging, there is no urothelial wall thickening and there are no filling defects in the opacified portions of the bilateral collecting systems or ureters. Stomach/Bowel: Stomach is within normal limits. No evidence of bowel wall thickening or dilatation. Stool throughout the colon. Appendix appears normal. Vascular/Lymphatic: No abdominal aorta or iliac aneurysm. Mild atherosclerotic plaque of the aorta and its branches. Lymph node dissection of the pelvis. No abdominal, pelvic, or inguinal lymphadenopathy. Reproductive: Uterus and bilateral adnexa are unremarkable. Other: No intraperitoneal free fluid. No intraperitoneal free gas. No organized fluid collection. Musculoskeletal: No abdominal wall hernia or abnormality. No suspicious lytic or blastic osseous lesions. No acute displaced fracture. Multilevel degenerative changes of the spine. IMPRESSION: 1. No central pulmonary embolus. Limited evaluation more distally due to artifact with question developing right upper lobe segmental pulmonary embolus. No right heart strain or pulmonary infarction. 2. Grossly stable in size of a 12 x 12 cm centrally necrotic heterogeneous right upper lobe mass. Associated mass effect on the right mainstem bronchi. 3. Stool throughout the colon consistent with constipation. 4.  Emphysema (ICD10-J43.9). Electronically Signed   By: Morgane  Naveau M.D.   On: 04/08/2024 20:58

## 2024-05-04 NOTE — Inpatient Diabetes Management (Signed)
 Inpatient Diabetes Program Recommendations  AACE/ADA: New Consensus Statement on Inpatient Glycemic Control  Target Ranges:  Prepandial:   less than 140 mg/dL      Peak postprandial:   less than 180 mg/dL (1-2 hours)      Critically ill patients:  140 - 180 mg/dL    Latest Reference Range & Units 05/03/24 07:38 05/03/24 11:26 05/03/24 17:03 05/03/24 20:42 05/04/24 07:45  Glucose-Capillary 70 - 99 mg/dL 872 (H) 899 (H) 78 881 (H) 50 (L)   Review of Glycemic Control  Diabetes history: DM1 Outpatient Diabetes medications: Tresiba 7 units BID, Humalog 1-15 units as needed, Dexcom G7; Prednisone  5 mg BID Current orders for Inpatient glycemic control: Semglee  7 units BID, Novolog  0-15 units TID  Inpatient Diabetes Program Recommendations:    Insulin : CBG 50 mg/dl this morning. Please consider decreasing Semglee  to 7 units at bedtime (to start tonight) and decrease Novolog  correction to 0-9 units TID with meals.  Thanks, Earnie Gainer, RN, MSN, CDCES Diabetes Coordinator Inpatient Diabetes Program [phone] (Team Pager from 8am to 5pm)

## 2024-05-04 NOTE — TOC Initial Note (Signed)
 Transition of Care [facility]) - Initial/Assessment Note    Patient Details  Name: Mary Erickson MRN: [id number] Date of Birth: 1966/11/11  Transition of Care [facility]) CM/SW Contact:    Hoy DELENA Bigness, LCSW Phone Number: 05/04/2024, 12:05 PM  Clinical Narrative:                 Pt from home alone and has support from friends/neighbors. Pt is active with [facility] for outpatient palliative care and currently having discussions for hospice services. TOC will continue to follow.   Expected Discharge Plan: Home w Hospice Care Barriers to Discharge: No Barriers Identified   Patient Goals and CMS Choice Patient states their goals for this hospitalization and ongoing recovery are:: To return home with hospice CMS Medicare.gov Compare Post Acute Care list provided to:: Patient Choice offered to / list presented to : Patient Friendship ownership interest in [facility].provided to::  (NA)    Expected Discharge Plan and Services In-house Referral: Clinical Social Work, Hospice / Palliative Care Discharge Planning Services: NA Post Acute Care Choice: Hospice Living arrangements for the past 2 months: Apartment                                      Prior Living Arrangements/Services Living arrangements for the past 2 months: Apartment Lives with:: Self Patient language and need for interpreter reviewed:: Yes Do you feel safe going back to the place where you live?: Yes      Need for Family Participation in Patient Care: No (Comment) Care giver support system in place?: Yes (comment) Current home services: Hospice ([facility]) Criminal Activity/Legal Involvement Pertinent to Current Situation/Hospitalization: No - Comment as needed  Activities of Daily Living   ADL Screening (condition at time of admission) Independently performs ADLs?: Yes (appropriate for developmental age) Is the patient deaf or have difficulty hearing?: No Does the patient have difficulty  seeing, even when wearing glasses/contacts?: No Does the patient have difficulty concentrating, remembering, or making decisions?: No  Permission Sought/Granted   Permission granted to share information with : Yes, Verbal Permission Granted     Permission granted to share info w AGENCY: [facility]        Emotional Assessment Appearance:: Appears stated age Attitude/Demeanor/Rapport: Engaged Affect (typically observed): Accepting Orientation: : Oriented to Self, Oriented to Place, Oriented to  Time, Oriented to Situation Alcohol / Substance Use: Not Applicable Psych Involvement: No (comment)  Admission diagnosis:  Chest wall pain [R07.89] Pain [R52] Nausea and vomiting, unspecified vomiting type [R11.2] Patient Active Problem List   Diagnosis Date Noted   Palliative care encounter 05/02/2024   High risk medication use 05/02/2024   Medication management 05/02/2024   Anxiety 05/02/2024   Need for emotional support 05/02/2024   Counseling and coordination of care 05/02/2024   DNR (do not resuscitate) 05/02/2024   ACP (advance care planning) 05/02/2024   NSCLC of right lung (HCC) 05/02/2024   Nausea 05/02/2024   Cancer associated pain 05/02/2024   Chest wall pain 05/01/2024   Intractable pain 04/24/2024   Nausea and vomiting 04/08/2024   Epigastric abdominal pain 04/08/2024   Sepsis due to pneumonia (HCC) 03/22/2024   ADHD 03/22/2024   Non-small cell lung cancer (NSCLC) (HCC) 03/22/2024   Cough with hemoptysis 03/22/2024   Hypocalcemia 03/22/2024   GERD (gastroesophageal reflux disease) 03/22/2024   History of melanoma 03/22/2024   Hepatitis B 03/22/2024   Adenocarcinoma  of upper lobe of right lung (HCC) 12/25/2023   Rheumatoid arthritis in remission (HCC) 12/11/2023   History of seizures 12/11/2023   Constipation 12/11/2023   Small intestinal bacterial overgrowth (SIBO) 12/11/2023   Mass of upper lobe of right lung 12/08/2023   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 [place] [address] Phone: [phone] Fax: [phone]  [facility] [id number] [place], [place] - [address] [place] [address] Phone: [phone] Fax: [phone]  [id number] - [place], [place] - [address] AT [facility] [address] [place] [address] Phone: [phone] Fax: [phone]     Social Drivers of Health (SDOH) Social History: SDOH Screenings   Food Insecurity: No Food Insecurity (05/02/2024)  Housing: Low Risk  (05/02/2024)  Transportation Needs: No Transportation Needs (05/02/2024)  Utilities: Not At Risk (05/02/2024)  Depression (PHQ2-9): Medium Risk (06/30/2019)  Financial Resource Strain: Low Risk  (02/13/2024)   Received from [facility]  Physical Activity: Unknown (08/13/2023)   Received from [facility]  Social Connections: Moderately Integrated (04/09/2024)  Stress: Patient Declined (08/13/2023)   Received from [facility]  Tobacco Use: Medium Risk (05/02/2024)   SDOH Interventions:     Readmission Risk Interventions    05/04/2024   12:03 PM 05/03/2024   10:45 AM 04/26/2024   11:31 AM  Readmission Risk Prevention Plan  Transportation Screening Complete Complete Complete  Medication Review Oceanographer) Complete Complete Complete  PCP or Specialist appointment within 3-5 days  of discharge Complete Complete Complete  HRI or Home Care Consult Complete Complete Complete  SW Recovery Care/Counseling Consult Complete Complete Not Complete  SW Consult Not Complete Comments   N/A  Palliative Care Screening Complete Not Applicable Complete  Skilled Nursing Facility Not Applicable Not Applicable Not Applicable

## 2024-05-04 NOTE — Plan of Care (Signed)

## 2024-05-04 NOTE — Progress Notes (Signed)
 PROGRESS NOTE    Mary Erickson  FMW:[id number] DOB: [birth year] DOA: 05/01/2024 PCP: Celestia Harder, NP   Brief Narrative: Mary Erickson is a 58 y.o. female with a history of non-small cell lung cancer, rheumatoid arthritis, Sjogren's disease, diabetes mellitus type, hyperlipidemia, internal carotid artery aneurysm, left eye blindness, migraine, seizures, IBS, neurogenic bladder, chronic constipation.  Patient presented secondary to intractable pain of her right chest related to her known cancer.  Patient started on IV analgesics for management.   Assessment and Plan:  Intractable chest pain Intractable cancer-related pain Patient is followed by hematology/oncology in addition to palliative care for pain management.  Patient is on MS Contin  and morphine  immediate release as an outpatient with inadequate control of pain.  On admission patient's regimen was adjusted in addition to adding Dilaudid  IV as needed.  Palliative care consulted for inpatient management of pain.  Patient is considering radiation therapy for palliative management. - Palliative Care recommendations: Patient transition to a Dilaudid  PCA pump; Dilaudid  IV as needed for breakthrough  Nausea and vomiting Possibly related to narcotic medications versus underlying disease process. - Continue Zofran  and Phenergan  as needed  Non-small cell lung cancer of the right lung Patient follows with Dr. Gatha as an outpatient, in addition to pulmonology.  Patient also follows with palliative care. -Will defer radiation oncology consultation as pain is better managed with change in pain management regimen  Possible post-obstructive pneumonia Chest x-ray suggests possible pneumonia versus atelectasis. Patient has an associated leukocytosis. -Levaquin 750 mg daily x5 days  Rheumatoid arthritis Sjogren's disease Patient is on prednisone  as outpatient.  Continued on admission. - Continue prednisone   Diabetes mellitus  type 1 Well-controlled based on last hemoglobin A1c of 6.9%.  Patient is managed degludec and insulin  was as an outpatient.  Medications not restarted on admission. - Decrease to Semglee  7 units every night - Sliding scale insulin   Hyponatremia Likely secondary to poor oral intake. Started on IV fluids with improvement. Urine studies no not suggest dehydration, though, and rather possible SIADH.  ADHD Noted.  Hypothyroidism - Continue levothyroxine  200 mcg daily  History of migraines Noted.  Asymptomatic.  History of seizures Patient is not on antiepileptic therapy.  Irritable bowel syndrome Chronic constipation - Continue MiraLAX   Neurogenic bladder Patient self catheterizes    DVT prophylaxis: Lovenox  Code Status:   Code Status: Limited: Do not attempt resuscitation (DNR) -DNR-LIMITED -Do Not Intubate/DNI  Family Communication: None at bedside Disposition Plan: Discharge home likely in several days pending ability to improve pain management in addition to outpatient analgesic regimen   Consultants:  Palliative care medicine Hematology/oncology  Procedures:  None  Antimicrobials: None   Subjective: Sleepy today. Pain is better controlled.  Objective: BP 123/64 (BP Location: Left Arm)   Pulse 66   Temp 97.7 F (36.5 C)   Resp 14   Ht 5' 7.99 (1.727 m)   Wt 59 kg   SpO2 95%   BMI 19.79 kg/m   Examination:  General exam: Appears calm and comfortable. Frail appearing. Respiratory system: Clear to auscultation. Respiratory effort normal. Cardiovascular system: S1 & S2 heard, RRR. No murmurs, rubs, gallops or clicks. Gastrointestinal system: Abdomen is nondistended, soft and nontender. Normal bowel sounds heard. Central nervous system: Alert and oriented. No focal neurological deficits. Psychiatry: Judgement and insight appear normal. Mood & affect appropriate.    Data Reviewed: I have personally reviewed following labs and imaging studies  CBC Lab  Results  Component Value Date   WBC  16.0 (H) 05/04/2024   RBC 3.95 05/04/2024   HGB 10.8 (L) 05/04/2024   HCT 34.6 (L) 05/04/2024   MCV 87.6 05/04/2024   MCH 27.3 05/04/2024   PLT 518 (H) 05/04/2024   MCHC 31.2 05/04/2024   RDW 16.2 (H) 05/04/2024   LYMPHSABS 1.2 05/01/2024   MONOABS 1.2 (H) 05/01/2024   EOSABS 0.1 05/01/2024   BASOSABS 0.1 05/01/2024     Last metabolic panel Lab Results  Component Value Date   NA 129 (L) 05/04/2024   K 4.0 05/04/2024   CL 94 (L) 05/04/2024   CO2 25 05/04/2024   BUN 10 05/04/2024   CREATININE 0.71 05/04/2024   GLUCOSE 54 (L) 05/04/2024   GFRNONAA >60 05/04/2024   GFRAA >60 03/18/2020   CALCIUM  8.7 (L) 05/04/2024   PHOS 2.7 04/26/2024   PROT 7.6 05/01/2024   ALBUMIN 3.8 05/01/2024   LABGLOB 4.4 (H) 12/08/2023   AGRATIO 0.8 12/08/2023   BILITOT 0.4 05/01/2024   ALKPHOS 147 (H) 05/01/2024   AST 53 (H) 05/01/2024   ALT 27 05/01/2024   ANIONGAP 10 05/04/2024    GFR: Estimated Creatinine Clearance: 71.4 mL/min (by C-G formula based on SCr of 0.71 mg/dL).  No results found for this or any previous visit (from the past 240 hours).    Radiology Studies: DG Chest 2 View Result Date: 05/02/2024 CLINICAL DATA:  Cough EXAM: CHEST - 2 VIEW COMPARISON:  Chest radiograph dated 04/24/2024 FINDINGS: Normal lung volumes. Similar appearance of known right apical mass. Interval increased density of the right upper lobe. Left basilar linear opacities. No pleural effusion or pneumothorax. The heart size and mediastinal contours are within normal limits. No acute osseous abnormality. Surgical clips project over the neck. IMPRESSION: 1. Similar appearance of known right apical mass. Interval increased density of the right upper lobe, which may represent postobstructive atelectasis or pneumonia. 2. Left basilar linear opacities, likely atelectasis. Electronically Signed   By: Limin  Xu M.D.   On: 05/02/2024 16:53      LOS: 2 days    Elgin Lam,  MD [facility] 05/04/2024, 8:41 AM   If 7PM-7AM, please contact night-coverage [email or web address]

## 2024-05-04 NOTE — Progress Notes (Signed)
 This patient alerted nurse tech staff that she had fallen in the bathroom to her knees and hit her head on the wall. MD was notified. No new orders.

## 2024-05-04 NOTE — Progress Notes (Signed)
   05/04/24 1500  What Happened  Was fall witnessed? No  Was patient injured? Unsure  Patient found other (Comment)  Found by Staff-comment  Stated prior activity bathroom-unassisted  Provider Notification  Provider Name/Title Nettey  Date Provider Notified 05/04/24  Time Provider Notified 1523  Method of Notification Page  Notification Reason Fall  Provider response See new orders  Date of Provider Response 05/04/24  Time of Provider Response 1524  Adult Fall Risk Assessment  Risk Factor Category (scoring not indicated) Fall has occurred during this admission (document High fall risk)  Age 58  Fall History: Fall within 6 months prior to admission 0  Elimination; Bowel and/or Urine Incontinence 0  Elimination; Bowel and/or Urine Urgency/Frequency 0  Medications: includes PCA/Opiates, Anti-convulsants, Anti-hypertensives, Diuretics, Hypnotics, Laxatives, Sedatives, and Psychotropics 3  Patient Care Equipment 1  Mobility-Assistance 2  Mobility-Gait 2  Mobility-Sensory Deficit 0  Altered awareness of immediate physical environment 0  Impulsiveness 0  Lack of understanding of one's physical/cognitive limitations 0  Total Score 8  Patient Fall Risk Level High fall risk  Adult Fall Risk Interventions  Required Bundle Interventions *See Row Information* High fall risk - low, moderate, and high requirements implemented  Additional Interventions Use of appropriate toileting equipment (bedpan, BSC, etc.)  Fall intervention(s) refused/Patient educated regarding refusal Nonskid socks  Screening for Fall Injury Risk (To be completed on HIGH fall risk patients) - Assessing Need for Floor Mats  Risk For Fall Injury- Criteria for Floor Mats None identified - No additional interventions needed  Pain Assessment  Pain Scale 0-10  Pain Score 0  Faces Pain Scale 0  Neurological  Neuro (WDL) WDL  Orientation Level Oriented X4  Cognition Appropriate at baseline  Speech Clear  Glasgow Coma  Scale  Eye Opening 4  Best Verbal Response (NON-intubated) 5  Best Motor Response 6  Glasgow Coma Scale Score 15  Musculoskeletal Details  RUE Full movement  LUE Full movement  RLE Weakness  LLE Weakness  Integumentary  Integumentary (WDL) X  Skin Color Appropriate for ethnicity  Skin Condition Dry  Skin Integrity Ecchymosis  Ecchymosis Location Head  Ecchymosis Location Orientation Other (Comment) (Forehead)  Skin Turgor Non-tenting

## 2024-05-05 ENCOUNTER — Inpatient Hospital Stay (HOSPITAL_COMMUNITY)

## 2024-05-05 DIAGNOSIS — C3491 Malignant neoplasm of unspecified part of right bronchus or lung: Secondary | ICD-10-CM | POA: Diagnosis not present

## 2024-05-05 DIAGNOSIS — D649 Anemia, unspecified: Secondary | ICD-10-CM | POA: Diagnosis not present

## 2024-05-05 DIAGNOSIS — D75839 Thrombocytosis, unspecified: Secondary | ICD-10-CM | POA: Diagnosis not present

## 2024-05-05 DIAGNOSIS — R0789 Other chest pain: Secondary | ICD-10-CM | POA: Diagnosis not present

## 2024-05-05 DIAGNOSIS — G893 Neoplasm related pain (acute) (chronic): Secondary | ICD-10-CM | POA: Diagnosis not present

## 2024-05-05 LAB — GLUCOSE, CAPILLARY
Glucose-Capillary: 184 mg/dL — ABNORMAL HIGH (ref 70–99)
Glucose-Capillary: 148 mg/dL — ABNORMAL HIGH (ref 70–99)
Glucose-Capillary: 165 mg/dL — ABNORMAL HIGH (ref 70–99)

## 2024-05-05 MED ORDER — SODIUM CHLORIDE 0.9 % IV SOLN
12.5000 mg | Freq: Four times a day (QID) | INTRAVENOUS | Status: DC | PRN
  Administered 2024-05-05 – 2024-05-08 (×6): 12.5 mg via INTRAVENOUS
  Filled 2024-05-05 (×4): qty 12.5
  Filled 2024-05-05: qty 0.5
  Filled 2024-05-05: qty 12.5

## 2024-05-05 MED ORDER — SODIUM CHLORIDE 0.9 % IV BOLUS: 1000.0000 mL | Freq: Once | INTRAVENOUS | Status: DC

## 2024-05-05 MED ORDER — INSULIN GLARGINE-YFGN 100 UNIT/ML ~~LOC~~ SOLN
8.0000 [IU] | Freq: Every day | SUBCUTANEOUS | Status: DC
  Administered 2024-05-05 – 2024-05-07 (×3): 8 [IU] via SUBCUTANEOUS
  Filled 2024-05-05 (×4): qty 0.08

## 2024-05-05 MED ORDER — SODIUM CHLORIDE 0.9 % IV SOLN: INTRAVENOUS | Status: AC

## 2024-05-05 NOTE — Progress Notes (Signed)
 Daily Progress Note   Patient Name: Mary Erickson       Date: 05/05/2024 DOB: 1966-03-02  Age: 58 y.o. MRN#: [id number] Attending Physician: Briana Elgin LABOR, MD Primary Care Physician: Celestia Harder, NP Admit Date: 05/01/2024 Length of Stay: 3 days  Reason for Consultation/Follow-up: Establishing goals of care and Pain control  Subjective:   CC: Patient resting comfortably at time of visit, however, has had vomiting earlier today and also feels dehydrated.  Following up regarding complex medical decision making and symptom management.  Subjective:  Reviewed EMR prior to presenting to bedside.    Pain medication options discussed with patient - explained about PO solution Dilaudid  and also has rescue IV breakthrough Dilaudid .   Also has fentanyl  patch.   Discussed about discontinuing IV Zofran  and starting IV Phenergan  for nausea/vomiting.   Also on Ativan  for anxiety and nausea management.     Review of Systems Nausea vomiting feels she is dehydrated.   Vital Signs:  BP (!) 98/57 (BP Location: Left Arm)   Pulse 63   Temp 97.7 F (36.5 C) (Oral)   Resp 18   Ht 5' 7.99 (1.727 m)   Wt 59 kg   SpO2 95%   BMI 19.79 kg/m   Physical Exam: General: Sleepy, chronically ill-appearing, frail, cachectic Cardiovascular: RRR Respiratory: no increased work of breathing noted, not in respiratory distress Abdomen: not distended Extremities: Muscle wasting present in all extremities Neuro: Sleepy  Assessment & Plan:   Assessment: Patient is a 58 year old female with a past medical history of non-small cell lung cancer-has stated wishes to not receive cancer directed therapies, rheumatoid arthritis, Sjogren's disease, diabetes mellitus, hyperlipidemia, internal carotid artery aneurysm, left eye blindness, migraines, seizures, IBS, and neurogenic bladder requiring self-catheterization who was admitted on 05/01/2024 for management of worsening cancer pain.  Palliative medicine  team consulted to assist with symptom management. Of note patient is known to palliative medicine team and follows up with palliative medicine team in outpatient setting.  Recommendations/Plan: # Complex medical decision making/goals of care:     - Discussed care with patient as detailed above in HPI.  Patient has cancer progression on imaging last month and does not want to pursue cancer directed therapies.  Patient does not want to pursue radiation unless required for worsening pain management; at this time pain managed by medications.  Patient agreeing with reevaluation by [facility] for home hospice support.  Had already reviewed most form with patient.  If patient can go home with hospice, would need to complete MOST form to aim on comfort measures only.  Palliative medicine team will engage in conversations moving forward as able and appropriate.                -  Code Status: Limited: Do not attempt resuscitation (DNR) -DNR-LIMITED -Do Not Intubate/DNI                      # Symptom management Patient is receiving these palliative interventions for symptom management with an intent to improve quality of life.                 - Pain, severe acute on chronic pain in the setting of non-small cell lung cancer                                   p.o. Dilaudid    solution 2-4mg  q 4 hours  prn. Based on requirements of prn, will likely need further up titration of fentanyl  patch.                                  IV Dilaudid  to 0.5- 1 mg every 3 hours as needed to breakthrough after oral short acting opioids                               - Continue fentanyl  patch 25 mcg every 72 hours                               -Consider use of dexamethasone  to help with pain management.  Patient chronically on prednisone  5 mg twice daily for RA.    - Not pursuing radiation at this time as pain being managed by medications.  If pain continues to worsen despite medications, could consider radiation involvement.                  - Nausea/vomiting Associated with pain and anxiety.  EKG on 04/28/2024 noting QTc 467.                               - Consider repeat EKG if receiving multiple QTc prolonging medications to monitor.                               resume Phenergan                                Discontinue Zofran  as per patient request.    -  oral Ativan  solution 0.26-5 mg every 4 hours as needed                                 IV Ativan  to 0.25-0.5 mg every 4 hours as needed                                - Constipation                                 - Continue MiraLAX  17 g daily                               - Continue senna 1 tab twice daily.  Continue to adjust based on symptom response.  # Discharge Planning: To Be Determined  - TOC assisting with discharge coordination.  ACC continuing to evaluate for possibility of home with hospice.  Discussed with: Patient, hospitalist, RN,    Thank you for allowing the palliative care team to participate in the care Alfredo R Dann. Mod MDM Lonia Serve MD.  Palliative Care Provider PMT # [phone]  If patient remains symptomatic despite maximum doses, please call PMT at [phone] between 0700 and 1900. Outside of these hours, please call attending, as PMT does not have night coverage.     Problems Addressed: One or more chronic  illnesses with severe exacerbation, progression, or side effects of treatment.  Risks: Parenteral controlled substances

## 2024-05-05 NOTE — Plan of Care (Signed)
  Problem: Education: Goal: Knowledge of General Education information will improve Description: Including pain rating scale, medication(s)/side effects and non-pharmacologic comfort measures Outcome: Progressing   Problem: Health Behavior/Discharge Planning: Goal: Ability to manage health-related needs will improve Outcome: Progressing   Problem: Clinical Measurements: Goal: Respiratory complications will improve Outcome: Progressing   Problem: Nutrition: Goal: Adequate nutrition will be maintained Outcome: Progressing   Problem: Safety: Goal: Ability to remain free from injury will improve Outcome: Progressing   Problem: Skin Integrity: Goal: Risk for impaired skin integrity will decrease Outcome: Progressing   Problem: Education: Goal: Ability to describe self-care measures that may prevent or decrease complications (Diabetes Survival Skills Education) will improve Outcome: Progressing   Problem: Health Behavior/Discharge Planning: Goal: Ability to identify and utilize available resources and services will improve Outcome: Progressing   Problem: Nutritional: Goal: Maintenance of adequate nutrition will improve Outcome: Progressing   Problem: Tissue Perfusion: Goal: Adequacy of tissue perfusion will improve Outcome: Progressing

## 2024-05-05 NOTE — Plan of Care (Signed)

## 2024-05-05 NOTE — Progress Notes (Signed)
 PROGRESS NOTE    KHADIJA THIER  FMW:[id number] DOB: Sep 25, 1966 DOA: 05/01/2024 PCP: Celestia Harder, NP   Brief Narrative: Mary Erickson is a 58 y.o. female with a history of non-small cell lung cancer, rheumatoid arthritis, Sjogren's disease, diabetes mellitus type, hyperlipidemia, internal carotid artery aneurysm, left eye blindness, migraine, seizures, IBS, neurogenic bladder, chronic constipation.  Patient presented secondary to intractable pain of her right chest related to her known cancer.  Patient started on IV analgesics for management.   Assessment and Plan:  Intractable chest pain Intractable cancer-related pain Patient is followed by hematology/oncology in addition to palliative care for pain management.  Patient is on MS Contin  and morphine  immediate release as an outpatient with inadequate control of pain.  On admission patient's regimen was adjusted in addition to adding Dilaudid  IV as needed.  Palliative care consulted for inpatient management of pain.  Patient is considering radiation therapy for palliative management. - Palliative Care recommendations: Fentanyl  patch, dilaudid  IV/PO as needed, avoid radiation, hospice at home on discharge  Nausea and vomiting Possibly related to narcotic medications versus underlying disease process. - Continue Zofran  and Phenergan  as needed  Non-small cell lung cancer of the right lung Patient follows with Dr. Gatha as an outpatient, in addition to pulmonology.  Patient also follows with palliative care. -Will defer radiation oncology consultation as pain is better managed with change in pain management regimen  Possible post-obstructive pneumonia Chest x-ray suggests possible pneumonia versus atelectasis. Patient has an associated leukocytosis. -Levaquin 750 mg daily x5 days  Rheumatoid arthritis Sjogren's disease Patient is on prednisone  as outpatient.  Continued on admission. - Continue prednisone   Diabetes  mellitus type 1 Well-controlled based on last hemoglobin A1c of 6.9%.  Patient is managed degludec and insulin  was as an outpatient.  Medications not restarted on admission. - Increase to Semglee  8 units every night - Sliding scale insulin   Hyponatremia Likely secondary to poor oral intake. Started on IV fluids with improvement. Urine studies no not suggest dehydration, though, and rather possible SIADH.  ADHD Noted.  Hypothyroidism - Continue levothyroxine  200 mcg daily  History of migraines Noted.  Asymptomatic.  History of seizures Patient is not on antiepileptic therapy.  Irritable bowel syndrome Chronic constipation - Continue MiraLAX   Neurogenic bladder Patient self catheterizes    DVT prophylaxis: Lovenox  Code Status:   Code Status: Limited: Do not attempt resuscitation (DNR) -DNR-LIMITED -Do Not Intubate/DNI  Family Communication: None at bedside Disposition Plan: Discharge home likely in 1-3 days pending ability to improve pain management in addition to outpatient analgesic regimen   Consultants:  Palliative care medicine Hematology/oncology  Procedures:  None  Antimicrobials: None   Subjective: Patient is unhappy about pain management yesterday but feels better today.  Objective: BP 121/65 (BP Location: Right Arm)   Pulse 72   Temp 98.5 F (36.9 C) (Oral)   Resp 18   Ht 5' 7.99 (1.727 m)   Wt 59 kg   SpO2 94%   BMI 19.79 kg/m   Examination:  General exam: Appears calm and comfortable. Frail appearing. Respiratory system: Clear to auscultation. Respiratory effort normal. Cardiovascular system: S1 & S2 heard, RRR.  Gastrointestinal system: Abdomen is nondistended, soft and nontender. Normal bowel sounds heard. Central nervous system: Alert and oriented. No focal neurological deficits. Musculoskeletal: No edema. No calf tenderness Psychiatry: Judgement and insight appear normal. Mood & affect appropriate.    Data Reviewed: I have  personally reviewed following labs and imaging studies  CBC Lab Results  Component Value Date   WBC 16.0 (H) 05/04/2024   RBC 3.95 05/04/2024   HGB 10.8 (L) 05/04/2024   HCT 34.6 (L) 05/04/2024   MCV 87.6 05/04/2024   MCH 27.3 05/04/2024   PLT 518 (H) 05/04/2024   MCHC 31.2 05/04/2024   RDW 16.2 (H) 05/04/2024   LYMPHSABS 1.2 05/01/2024   MONOABS 1.2 (H) 05/01/2024   EOSABS 0.1 05/01/2024   BASOSABS 0.1 05/01/2024     Last metabolic panel Lab Results  Component Value Date   NA 129 (L) 05/04/2024   K 4.0 05/04/2024   CL 94 (L) 05/04/2024   CO2 25 05/04/2024   BUN 10 05/04/2024   CREATININE 0.71 05/04/2024   GLUCOSE 54 (L) 05/04/2024   GFRNONAA >60 05/04/2024   GFRAA >60 03/18/2020   CALCIUM  8.7 (L) 05/04/2024   PHOS 2.7 04/26/2024   PROT 7.6 05/01/2024   ALBUMIN 3.8 05/01/2024   LABGLOB 4.4 (H) 12/08/2023   AGRATIO 0.8 12/08/2023   BILITOT 0.4 05/01/2024   ALKPHOS 147 (H) 05/01/2024   AST 53 (H) 05/01/2024   ALT 27 05/01/2024   ANIONGAP 10 05/04/2024    GFR: Estimated Creatinine Clearance: 71.4 mL/min (by C-G formula based on SCr of 0.71 mg/dL).  No results found for this or any previous visit (from the past 240 hours).    Radiology Studies: No results found.     LOS: 3 days    Elgin Lam, MD [facility] 05/05/2024, 8:57 AM   If 7PM-7AM, please contact night-coverage [email or web address]

## 2024-05-06 DIAGNOSIS — C3491 Malignant neoplasm of unspecified part of right bronchus or lung: Secondary | ICD-10-CM | POA: Diagnosis not present

## 2024-05-06 DIAGNOSIS — D75839 Thrombocytosis, unspecified: Secondary | ICD-10-CM | POA: Diagnosis not present

## 2024-05-06 DIAGNOSIS — G893 Neoplasm related pain (acute) (chronic): Secondary | ICD-10-CM | POA: Diagnosis not present

## 2024-05-06 DIAGNOSIS — D649 Anemia, unspecified: Secondary | ICD-10-CM | POA: Diagnosis not present

## 2024-05-06 LAB — BASIC METABOLIC PANEL WITH GFR
Anion gap: 9 (ref 5–15)
BUN: 12 mg/dL (ref 6–20)
CO2: 24 mmol/L (ref 22–32)
Calcium: 8.3 mg/dL — ABNORMAL LOW (ref 8.9–10.3)
Chloride: 90 mmol/L — ABNORMAL LOW (ref 98–111)
Creatinine, Ser: 0.7 mg/dL (ref 0.44–1.00)
GFR, Estimated: >60 mL/min (ref >60)
Glucose, Bld: 175 mg/dL — ABNORMAL HIGH (ref 70–99)
Potassium: 3.9 mmol/L (ref 3.5–5.1)
Sodium: 123 mmol/L — ABNORMAL LOW (ref 135–145)

## 2024-05-06 LAB — CBC
HCT: 37.3 % (ref 36.0–46.0)
Hemoglobin: 11.6 g/dL — ABNORMAL LOW (ref 12.0–15.0)
MCH: 27.2 pg (ref 26.0–34.0)
MCHC: 31.1 g/dL (ref 30.0–36.0)
MCV: 87.6 fL (ref 80.0–100.0)
Platelets: 529 10*3/uL — ABNORMAL HIGH (ref 150–400)
RBC: 4.26 MIL/uL (ref 3.87–5.11)
RDW: 16 % — ABNORMAL HIGH (ref 11.5–15.5)
WBC: 17.9 10*3/uL — ABNORMAL HIGH (ref 4.0–10.5)
nRBC: 0 % (ref 0.0–0.2)

## 2024-05-06 LAB — GLUCOSE, CAPILLARY
Glucose-Capillary: 124 mg/dL — ABNORMAL HIGH (ref 70–99)
Glucose-Capillary: 130 mg/dL — ABNORMAL HIGH (ref 70–99)
Glucose-Capillary: 128 mg/dL — ABNORMAL HIGH (ref 70–99)
Glucose-Capillary: 277 mg/dL — ABNORMAL HIGH (ref 70–99)

## 2024-05-06 MED ORDER — CHLORHEXIDINE GLUCONATE CLOTH 2 % EX PADS
6.0000 | MEDICATED_PAD | Freq: Every day | CUTANEOUS | Status: DC
  Administered 2024-05-06 – 2024-05-08 (×3): 6 via TOPICAL

## 2024-05-06 MED ORDER — HYDROMORPHONE HCL 1 MG/ML PO LIQD
2.0000 mg | ORAL | Status: DC | PRN
  Administered 2024-05-06 – 2024-05-07 (×6): 4 mg via ORAL
  Administered 2024-05-07: 2 mg via ORAL
  Administered 2024-05-07 – 2024-05-08 (×2): 4 mg via ORAL
  Filled 2024-05-06 (×4): qty 4
  Filled 2024-05-06: qty 2
  Filled 2024-05-06 (×5): qty 4

## 2024-05-06 MED ORDER — FENTANYL 50 MCG/HR TD PT72
1.0000 | MEDICATED_PATCH | TRANSDERMAL | Status: DC
  Administered 2024-05-06: 1 via TRANSDERMAL
  Filled 2024-05-06: qty 1

## 2024-05-06 MED ORDER — POLYETHYLENE GLYCOL 3350 17 G PO PACK
17.0000 g | PACK | Freq: Two times a day (BID) | ORAL | Status: DC
  Administered 2024-05-06 – 2024-05-08 (×4): 17 g via ORAL
  Filled 2024-05-06 (×4): qty 1

## 2024-05-06 NOTE — Plan of Care (Signed)
  Problem: Safety: Goal: Ability to remain free from injury will improve Outcome: Progressing   Problem: Fluid Volume: Goal: Ability to maintain a balanced intake and output will improve Outcome: Progressing   Problem: Metabolic: Goal: Ability to maintain appropriate glucose levels will improve Outcome: Progressing   Problem: Nutritional: Goal: Maintenance of adequate nutrition will improve Outcome: Progressing   Problem: Skin Integrity: Goal: Risk for impaired skin integrity will decrease Outcome: Progressing   Problem: Tissue Perfusion: Goal: Adequacy of tissue perfusion will improve Outcome: Progressing

## 2024-05-06 NOTE — Plan of Care (Signed)
  Problem: Coping: Goal: Level of anxiety will decrease Outcome: Not Progressing   Problem: Pain Managment: Goal: General experience of comfort will improve and/or be controlled Outcome: Not Progressing

## 2024-05-06 NOTE — Progress Notes (Signed)
 Mary Erickson [phone] [facility] Liaison Note  Received request for hospice services at home after discharge. Spoke with patient to initiate education related to hospice philosophy, services, and team approach to care. Patient/family verbalized understanding of information given. Per discussion, the plan is for discharge home, date TBD.   DME needs discussed. Patient has the oxygen and nebulizer in the home. Patient/family requests the following equipment for delivery: hospital bed, overbed table, bedside commode, and wheeled walker. The address has been verified and is correct in the chart. Her neighbor, Aldona [phone]  is the family contact to arrange time of equipment delivery. Discussed this plan with neighbor who will have to have her husband assist to make space for DME in patient's apartment. She will follow up with us  when she is ready for DME to be delivered.   Please send signed and completed DNR home with patient/family. Please provide prescriptions at discharge as needed to ensure ongoing symptom management.   [facility] information and contact numbers given to patient. Above information shared with Rolanda, Transitions of Care Manager. Please call with any questions or concerns.   Thank you for the opportunity to participate in this patient's care.   Elouise Husband BSN, Charity fundraiser, [facility] [employer] [phone]

## 2024-05-06 NOTE — Progress Notes (Signed)
 PROGRESS NOTE    Mary Erickson  FMW:[id number] DOB: 1966/01/05 DOA: 05/01/2024 PCP: Celestia Harder, NP   Brief Narrative:  Mary Erickson is a 58 y.o. female with a history of non-small cell lung cancer, rheumatoid arthritis, Sjogren's disease, diabetes mellitus type, hyperlipidemia, internal carotid artery aneurysm, left eye blindness, migraine, seizures, IBS, neurogenic bladder, chronic constipation.  Patient presented secondary to intractable pain of her right chest related to her known cancer.  Patient started on IV analgesics for management.     Assessment & Plan:   Principal Problem:   Chest wall pain Active Problems:   Palliative care encounter   High risk medication use   Medication management   Anxiety   Need for emotional support   Counseling and coordination of care   DNR (do not resuscitate)   ACP (advance care planning)   NSCLC of right lung (HCC)   Nausea   Cancer associated pain   Anemia   Thrombocytosis   Intractable chest pain Intractable cancer-related pain Patient is followed by hematology/oncology in addition to palliative care for pain management.   Regimen previously including MS Contin  and IR morphine  sulfate Patient currently on IR and p.o. Dilaudid  as well as increased dose of fentanyl  patch with improving symptoms - Dispo pending pain management, likely home with hospice   Nausea and vomiting, resolved Possibly related to narcotic medications versus underlying disease process. - Continue Zofran  and Phenergan  as needed   Non-small cell lung cancer of the right lung Patient follows with Dr. Gatha as an outpatient, in addition to pulmonology.  Patient also follows with palliative care. -Will defer radiation oncology consultation as pain is better managed with change in pain management regimen   Possible post-obstructive pneumonia Chest x-ray suggests possible pneumonia versus atelectasis. Patient has an associated  leukocytosis. -Levaquin 750 mg daily x5 days -last dose 05/07/2024   Rheumatoid arthritis Sjogren's disease Patient is on prednisone  as outpatient.  Continued on admission. - Continue prednisone    Diabetes mellitus type 1 Well-controlled based on last hemoglobin A1c of 6.9%.  Patient is managed degludec and insulin  was as an outpatient.  Medications not restarted on admission. - Increase to Semglee  8 units every night - Sliding scale insulin    Hyponatremia Likely secondary to poor oral intake. Started on IV fluids with improvement. Urine studies no not suggest dehydration, though, and rather possible SIADH.   ADHD Noted.   Hypothyroidism - Continue levothyroxine  200 mcg daily   History of migraines Noted.  Asymptomatic.   History of seizures Patient is not on antiepileptic therapy.   Irritable bowel syndrome Chronic constipation - Continue MiraLAX    Neurogenic bladder Patient self catheterizes Foley catheter placed today for quality of life/comfort  DVT prophylaxis: enoxaparin  (LOVENOX ) injection 40 mg Start: 05/02/24 1300 Code Status:   Code Status: Limited: Do not attempt resuscitation (DNR) -DNR-LIMITED -Do Not Intubate/DNI  Family Communication: None present  Status is: Inpatient  Dispo: The patient is from: Home              Anticipated d/c is to: Home              Anticipated d/c date is: 24 to 48 hours              Patient currently not medically stable for discharge given need for further IV narcotics  Subjective: No acute issues or events overnight  Objective: Vitals:   05/05/24 0852 05/05/24 1202 05/05/24 2048 05/06/24 0627  BP:  (!) 98/57 123/61  108/61  Pulse:  63 70 74  Resp:  18 18 18   Temp:  97.7 F (36.5 C) 98.1 F (36.7 C) 98.1 F (36.7 C)  TempSrc:  Oral    SpO2: 94% 95% 100% 93%  Weight:      Height:        Intake/Output Summary (Last 24 hours) at 05/06/2024 0802 Last data filed at 05/06/2024 0100 Gross per 24 hour  Intake 474 ml   Output --  Net 474 ml   Filed Weights   05/01/24 1710 05/02/24 0235  Weight: 55.3 kg 59 kg    Examination:  General:  Pleasantly resting in bed, No acute distress. HEENT:  Normocephalic atraumatic.  Sclerae nonicteric, noninjected.  Extraocular movements intact bilaterally. Neck:  Without mass or deformity.  Trachea is midline. Lungs:  Clear to auscultate bilaterally without rhonchi, wheeze, or rales. Heart:  Regular rate and rhythm.  Without murmurs, rubs, or gallops. Abdomen:  Soft, nontender, nondistended.  Without guarding or rebound. Extremities: Without cyanosis, clubbing, edema, or obvious deformity. Skin:  Warm and dry, no erythema.  Data Reviewed: I have personally reviewed following labs and imaging studies  CBC: Recent Labs  Lab 05/01/24 2208 05/02/24 1120 05/03/24 0826 05/04/24 0546 05/06/24 0539  WBC 17.4* 17.3* 17.3* 16.0* 17.9*  NEUTROABS 14.7*  --   --   --   --   HGB 10.9* 11.3* 10.7* 10.8* 11.6*  HCT 33.9* 35.4* 34.6* 34.6* 37.3  MCV 84.5 86.8 86.7 87.6 87.6  PLT 529* 505* 487* 518* 529*   Basic Metabolic Panel: Recent Labs  Lab 05/01/24 2208 05/02/24 1120 05/03/24 0826 05/04/24 0546 05/06/24 0539  NA 128* 126* 130* 129* 123*  K 4.6 4.0 4.0 4.0 3.9  CL 89* 91* 98 94* 90*  CO2 29 27 22 25 24   GLUCOSE 152* 154* 127* 54* 175*  BUN 9 11 9 10 12   CREATININE 0.74 0.75 0.87 0.71 0.70  CALCIUM  9.6 8.3* 8.4* 8.7* 8.3*   GFR: Estimated Creatinine Clearance: 71.4 mL/min (by C-G formula based on SCr of 0.7 mg/dL). Liver Function Tests: Recent Labs  Lab 05/01/24 2208  AST 53*  ALT 27  ALKPHOS 147*  BILITOT 0.4  PROT 7.6  ALBUMIN 3.8   Recent Labs  Lab 05/01/24 2208  LIPASE <10*   CBG: Recent Labs  Lab 05/04/24 2058 05/05/24 0727 05/05/24 1740 05/05/24 2059 05/06/24 0755  GLUCAP 136* 184* 148* 165* 124*    No results found for this or any previous visit (from the past 240 hours).   Radiology Studies: DG Abd 2 Views Result  Date: 05/05/2024 CLINICAL DATA:  Vomiting EXAM: ABDOMEN - 2 VIEW COMPARISON:  Abdominal x-ray 06/18/2023. CT chest abdomen and pelvis 04/08/2024. FINDINGS: Right upper lobe lung mass is partially imaged, unchanged. Bowel gas pattern is nonobstructive. There is a very large amount of stool throughout the entire colon. Multiple surgical clips are seen in the pelvis. Stomach and small bowel are nondilated. Cholecystectomy clips are present. No suspicious calcifications. IMPRESSION: Very large amount of stool throughout the entire colon. No evidence of bowel obstruction. Electronically Signed   By: Greig Pique M.D.   On: 05/05/2024 17:55   Scheduled Meds:  Dexlansoprazole   30 mg Oral Daily   enoxaparin  (LOVENOX ) injection  40 mg Subcutaneous Daily   fentaNYL   1 patch Transdermal Q72H   fluticasone  furoate-vilanterol  1 puff Inhalation Daily   insulin  aspart  0-5 Units Subcutaneous QHS   insulin  aspart  0-9 Units Subcutaneous TID  WC   insulin  glargine-yfgn  8 Units Subcutaneous QHS   levofloxacin  750 mg Oral Daily   levothyroxine   200 mcg Oral Q0600   polyethylene glycol  17 g Oral Daily   predniSONE   5 mg Oral BID   senna  1 tablet Oral BID   Continuous Infusions:  sodium chloride  40 mL/hr at 05/06/24 0100   promethazine  (PHENERGAN ) injection (IM or IVPB) Stopped (05/05/24 2310)     LOS: 4 days   Time spent:  Mary JAYSON Montclair, DO [facility]  If 7PM-7AM, please contact night-coverage [email or web address]  05/06/2024, 8:02 AM

## 2024-05-06 NOTE — Progress Notes (Signed)
 Daily Progress Note   Patient Name: Mary Erickson       Date: 05/06/2024 DOB: May 13, 1966  Age: 58 y.o. MRN#: [id number] Attending Physician: Lue Elsie BROCKS, MD Primary Care Physician: Celestia Harder, NP Admit Date: 05/01/2024 Length of Stay: 4 days  Reason for Consultation/Follow-up: Establishing goals of care and Pain control  Subjective:   CC: Patient resting comfortably at time of visit, however, awakens at says that she had a rough night, believes that she needs IVF, complains of pain and nausea, states that she did have a bowel movement on 05-05-24, X ray with large stool burden noted.   Following up regarding complex medical decision making and symptom management.  Subjective:  Reviewed EMR prior to presenting to bedside.    Pain medication options discussed with patient - explained about PO solution Dilaudid  and also has rescue IV breakthrough Dilaudid .   Also has fentanyl  patch.    IV Phenergan  for nausea/vomiting.   Also on Ativan  for anxiety and nausea management.     Review of Systems Nausea vomiting feels she continues to be dehydrated. Complains of pain.   Vital Signs:  BP 108/61 (BP Location: Right Arm)   Pulse 74   Temp 98.1 F (36.7 C)   Resp 18   Ht 5' 7.99 (1.727 m)   Wt 59 kg   SpO2 93%   BMI 19.79 kg/m   Physical Exam: General: Sleepy, chronically ill-appearing, frail, cachectic Cardiovascular: RRR Respiratory: no increased work of breathing noted, not in respiratory distress Abdomen: not distended Extremities: Muscle wasting present in all extremities Neuro: Sleepy  Assessment & Plan:   Assessment: Patient is a 58 year old female with a past medical history of non-small cell lung cancer-has stated wishes to not receive cancer directed therapies, rheumatoid arthritis, Sjogren's disease, diabetes mellitus, hyperlipidemia, internal carotid artery aneurysm, left eye blindness, migraines, seizures, IBS, and neurogenic bladder requiring  self-catheterization who was admitted on 05/01/2024 for management of worsening cancer pain.  Palliative medicine team consulted to assist with symptom management. Of note patient is known to palliative medicine team and follows up with palliative medicine team in outpatient setting.  Recommendations/Plan: # Complex medical decision making/goals of care:     - Discussed care with patient as detailed above in HPI.  Patient has cancer progression on imaging last month and does not want to pursue cancer directed therapies.  Patient does not want to pursue radiation unless required for worsening pain management; at this time pain managed by medications.  Patient agreeing with reevaluation by [facility] for home hospice support.  Had already reviewed most form with patient.  If patient can go home with hospice, would need to complete MOST form to aim on comfort measures only.  Palliative medicine team will engage in conversations moving forward as able and appropriate.                -  Code Status: Limited: Do not attempt resuscitation (DNR) -DNR-LIMITED -Do Not Intubate/DNI                      # Symptom management Patient is receiving these palliative interventions for symptom management with an intent to improve quality of life.                 - Pain, severe acute on chronic pain in the setting of non-small cell lung cancer  p.o. Dilaudid    solution 2-4mg  q 4 hours prn. Based on requirements of prn, will likely need further up titration of fentanyl  patch.                                  IV Dilaudid  to 0.5- 1 mg every 3 hours as needed to breakthrough after oral short acting opioids                               - fentanyl  patch 50 mcg every 72 hours                               -Consider use of dexamethasone  to help with pain management.  Patient chronically on prednisone  5 mg twice daily for RA.    - Not pursuing radiation at this time as pain being managed by medications.   If pain continues to worsen despite medications, could consider radiation involvement.                 - Nausea/vomiting Associated with pain and anxiety.  EKG on 04/28/2024 noting QTc 467.                               - Consider repeat EKG if receiving multiple QTc prolonging medications to monitor.                               Phenergan                                Discontinue Zofran  as per patient request.    -  oral Ativan  solution 0.26-5 mg every 4 hours as needed                                 IV Ativan  to 0.25-0.5 mg every 4 hours as needed                                - Constipation                                MiraLAX  17 g  BID.                                - Continue senna 1 tab twice daily.  Continue to adjust based on symptom response. X ray with stool burden.   # Discharge Planning: To Be Determined  - TOC assisting with discharge coordination.  ACC continuing to evaluate for possibility of home with hospice.  Discussed with: Patient, hospitalist, RN,    Thank you for allowing the palliative care team to participate in the care Mary Erickson. Mod MDM Lonia Serve MD.  Palliative Care Provider PMT # [phone]  If patient remains symptomatic despite maximum doses, please call PMT at [phone] between 0700 and 1900. Outside of these hours, please call attending, as PMT does not have  night coverage.     Problems Addressed: One or more chronic illnesses with severe exacerbation, progression, or side effects of treatment.  Risks: Parenteral controlled substances

## 2024-05-07 DIAGNOSIS — D649 Anemia, unspecified: Secondary | ICD-10-CM | POA: Diagnosis not present

## 2024-05-07 DIAGNOSIS — G893 Neoplasm related pain (acute) (chronic): Secondary | ICD-10-CM | POA: Diagnosis not present

## 2024-05-07 DIAGNOSIS — D75839 Thrombocytosis, unspecified: Secondary | ICD-10-CM | POA: Diagnosis not present

## 2024-05-07 DIAGNOSIS — R0789 Other chest pain: Secondary | ICD-10-CM | POA: Diagnosis not present

## 2024-05-07 DIAGNOSIS — C3491 Malignant neoplasm of unspecified part of right bronchus or lung: Secondary | ICD-10-CM | POA: Diagnosis not present

## 2024-05-07 LAB — GLUCOSE, CAPILLARY
Glucose-Capillary: 123 mg/dL — ABNORMAL HIGH (ref 70–99)
Glucose-Capillary: 186 mg/dL — ABNORMAL HIGH (ref 70–99)
Glucose-Capillary: 191 mg/dL — ABNORMAL HIGH (ref 70–99)
Glucose-Capillary: 193 mg/dL — ABNORMAL HIGH (ref 70–99)
Glucose-Capillary: 89 mg/dL (ref 70–99)

## 2024-05-07 MED ORDER — METHYLPHENIDATE HCL 5 MG PO TABS
5.0000 mg | ORAL_TABLET | Freq: Two times a day (BID) | ORAL | Status: DC
  Administered 2024-05-07 – 2024-05-08 (×2): 5 mg via ORAL
  Filled 2024-05-07 (×2): qty 1

## 2024-05-07 MED ORDER — HYDROMORPHONE HCL 1 MG/ML IJ SOLN: 0.5000 mg | Freq: Three times a day (TID) | INTRAMUSCULAR | Status: DC | PRN

## 2024-05-07 MED ORDER — LINACLOTIDE 145 MCG PO CAPS
145.0000 ug | ORAL_CAPSULE | Freq: Every day | ORAL | Status: DC
  Administered 2024-05-07 – 2024-05-08 (×2): 145 ug via ORAL
  Filled 2024-05-07 (×2): qty 1

## 2024-05-07 MED ORDER — MORPHINE SULFATE ER 15 MG PO TBCR
15.0000 mg | EXTENDED_RELEASE_TABLET | Freq: Two times a day (BID) | ORAL | Status: DC
  Administered 2024-05-07 – 2024-05-08 (×3): 15 mg via ORAL
  Filled 2024-05-07 (×3): qty 1

## 2024-05-07 MED ORDER — METHYLPHENIDATE HCL ER (OSM) 18 MG PO TBCR: 18.0000 mg | EXTENDED_RELEASE_TABLET | Freq: Every morning | ORAL | Status: DC

## 2024-05-07 NOTE — Progress Notes (Signed)
 PROGRESS NOTE    Mary Erickson  FMW:[id number] DOB: 02/06/1966 DOA: 05/01/2024 PCP: Celestia Harder, NP   Brief Narrative:  Mary Erickson is a 58 y.o. female with a history of non-small cell lung cancer, rheumatoid arthritis, Sjogren's disease, diabetes mellitus type, hyperlipidemia, internal carotid artery aneurysm, left eye blindness, migraine, seizures, IBS, neurogenic bladder, chronic constipation.  Patient presented secondary to intractable pain of her right chest related to her known cancer.  Patient started on IV analgesics for management.   Assessment & Plan:   Principal Problem:   Chest wall pain Active Problems:   Palliative care encounter   High risk medication use   Medication management   Anxiety   Need for emotional support   Counseling and coordination of care   DNR (do not resuscitate)   ACP (advance care planning)   NSCLC of right lung (HCC)   Nausea   Cancer associated pain   Anemia   Thrombocytosis   Intractable chest pain Intractable cancer-related pain Patient is followed by hematology/oncology in addition to palliative care for pain management.   Regimen previously including MS Contin  and IR morphine  sulfate Continue to titrate medications, add low-dose scheduled MS Contin  as well as methylphenidate  Continue previous regimen of PO Dilaudid  as well as fentanyl  Dispo pending pain management, likely home with hospice   Nausea and vomiting, resolved Possibly related to narcotic medications versus underlying disease process. - Continue Zofran  and Phenergan  as needed   Non-small cell lung cancer of the right lung Patient follows with Dr. Gatha as an outpatient, in addition to pulmonology.  Patient also follows with palliative care. -Will defer radiation oncology consultation as pain is better managed with change in pain management regimen   Possible post-obstructive pneumonia Chest x-ray suggests possible pneumonia versus atelectasis.  Patient has an associated leukocytosis. -Levaquin 750 mg daily x5 days -last dose 05/07/2024   Rheumatoid arthritis Sjogren's disease Patient is on prednisone  as outpatient.  Continued on admission. - Continue prednisone    Diabetes mellitus type 1 Well-controlled based on last hemoglobin A1c of 6.9%.  Patient is managed degludec and insulin  was as an outpatient.  Medications not restarted on admission. - Increase to Semglee  8 units every night - Sliding scale insulin    Hyponatremia Likely secondary to poor oral intake. Started on IV fluids with improvement. Urine studies no not suggest dehydration, though, and rather possible SIADH.   ADHD Noted.   Hypothyroidism - Continue levothyroxine  200 mcg daily   History of migraines Noted.  Asymptomatic.   History of seizures Patient is not on antiepileptic therapy.   Irritable bowel syndrome Chronic constipation - Continue MiraLAX    Neurogenic bladder Patient self catheterizes Foley catheter placed today for quality of life/comfort  DVT prophylaxis: enoxaparin  (LOVENOX ) injection 40 mg Start: 05/02/24 1300 Code Status:   Code Status: Limited: Do not attempt resuscitation (DNR) -DNR-LIMITED -Do Not Intubate/DNI  Family Communication: None present  Status is: Inpatient  Dispo: The patient is from: Home              Anticipated d/c is to: Home              Anticipated d/c date is: 24 hours              Patient currently not medically stable for discharge given need for further IV narcotics  Subjective: No acute issues or events overnight  Objective: Vitals:   05/06/24 0627 05/06/24 1213 05/06/24 1926 05/07/24 0521  BP: 108/61 123/72 118/66 107/64  Pulse: 74 67 74 72  Resp: 18 16 18 17   Temp: 98.1 F (36.7 C) 97.9 F (36.6 C) 98.4 F (36.9 C) 98.2 F (36.8 C)  TempSrc:  Oral Oral Oral  SpO2: 93% 98% 96% 96%  Weight:      Height:        Intake/Output Summary (Last 24 hours) at 05/07/2024 1209 Last data filed at  05/07/2024 0900 Gross per 24 hour  Intake 120 ml  Output 3100 ml  Net -2980 ml   Filed Weights   05/01/24 1710 05/02/24 0235  Weight: 55.3 kg 59 kg    Examination:  General:  Pleasantly resting in bed, No acute distress. HEENT:  Normocephalic atraumatic.  Sclerae nonicteric, noninjected.  Extraocular movements intact bilaterally. Neck:  Without mass or deformity.  Trachea is midline. Lungs:  Clear to auscultate bilaterally without rhonchi, wheeze, or rales. Heart:  Regular rate and rhythm.  Without murmurs, rubs, or gallops. Abdomen:  Soft, nontender, nondistended.  Without guarding or rebound. Extremities: Without cyanosis, clubbing, edema, or obvious deformity. Skin:  Warm and dry, no erythema.  Data Reviewed: I have personally reviewed following labs and imaging studies  CBC: Recent Labs  Lab 05/01/24 2208 05/02/24 1120 05/03/24 0826 05/04/24 0546 05/06/24 0539  WBC 17.4* 17.3* 17.3* 16.0* 17.9*  NEUTROABS 14.7*  --   --   --   --   HGB 10.9* 11.3* 10.7* 10.8* 11.6*  HCT 33.9* 35.4* 34.6* 34.6* 37.3  MCV 84.5 86.8 86.7 87.6 87.6  PLT 529* 505* 487* 518* 529*   Basic Metabolic Panel: Recent Labs  Lab 05/01/24 2208 05/02/24 1120 05/03/24 0826 05/04/24 0546 05/06/24 0539  NA 128* 126* 130* 129* 123*  K 4.6 4.0 4.0 4.0 3.9  CL 89* 91* 98 94* 90*  CO2 29 27 22 25 24   GLUCOSE 152* 154* 127* 54* 175*  BUN 9 11 9 10 12   CREATININE 0.74 0.75 0.87 0.71 0.70  CALCIUM  9.6 8.3* 8.4* 8.7* 8.3*   GFR: Estimated Creatinine Clearance: 71.4 mL/min (by C-G formula based on SCr of 0.7 mg/dL). Liver Function Tests: Recent Labs  Lab 05/01/24 2208  AST 53*  ALT 27  ALKPHOS 147*  BILITOT 0.4  PROT 7.6  ALBUMIN 3.8   Recent Labs  Lab 05/01/24 2208  LIPASE <10*   CBG: Recent Labs  Lab 05/06/24 1627 05/06/24 2106 05/07/24 0020 05/07/24 0737 05/07/24 1150  GLUCAP 128* 277* 186* 191* 193*    No results found for this or any previous visit (from the past 240  hours).   Radiology Studies: DG Abd 2 Views Result Date: 05/05/2024 CLINICAL DATA:  Vomiting EXAM: ABDOMEN - 2 VIEW COMPARISON:  Abdominal x-ray 06/18/2023. CT chest abdomen and pelvis 04/08/2024. FINDINGS: Right upper lobe lung mass is partially imaged, unchanged. Bowel gas pattern is nonobstructive. There is a very large amount of stool throughout the entire colon. Multiple surgical clips are seen in the pelvis. Stomach and small bowel are nondilated. Cholecystectomy clips are present. No suspicious calcifications. IMPRESSION: Very large amount of stool throughout the entire colon. No evidence of bowel obstruction. Electronically Signed   By: Greig Pique M.D.   On: 05/05/2024 17:55   Scheduled Meds:  Chlorhexidine  Gluconate Cloth  6 each Topical Daily   Dexlansoprazole   30 mg Oral Daily   enoxaparin  (LOVENOX ) injection  40 mg Subcutaneous Daily   fentaNYL   1 patch Transdermal Q72H   fluticasone  furoate-vilanterol  1 puff Inhalation Daily   insulin  aspart  0-5  Units Subcutaneous QHS   insulin  aspart  0-9 Units Subcutaneous TID WC   insulin  glargine-yfgn  8 Units Subcutaneous QHS   levothyroxine   200 mcg Oral Q0600   linaclotide   145 mcg Oral QAC breakfast   methylphenidate   5 mg Oral BID WC   morphine   15 mg Oral Q12H   polyethylene glycol  17 g Oral BID   predniSONE   5 mg Oral BID   senna  1 tablet Oral BID   Continuous Infusions:  promethazine  (PHENERGAN ) injection (IM or IVPB) 12.5 mg (05/06/24 1705)     LOS: 5 days   Time spent:  Elsie JAYSON [place], DO [facility]  If 7PM-7AM, please contact night-coverage [email or web address]  05/07/2024, 12:09 PM

## 2024-05-07 NOTE — Progress Notes (Signed)
 Daily Progress Note   Patient Name: Mary Erickson       Date: 05/07/2024 DOB: 1966/01/10  Age: 58 y.o. MRN#: [id number] Attending Physician: Lue Elsie BROCKS, MD Primary Care Physician: Celestia Harder, NP Admit Date: 05/01/2024 Length of Stay: 5 days  Reason for Consultation/Follow-up: Establishing goals of care and Pain control  Subjective:   CC: Patient resting comfortably at time of visit, however, awakens at says that she had a rough night but comfortable at present.   Following up regarding complex medical decision making and symptom management.  Subjective:  Reviewed EMR prior to presenting to bedside.    Pain medication options discussed with patient - explained about PO solution Dilaudid  and also has rescue IV breakthrough Dilaudid .   Also has fentanyl  patch.    IV Phenergan  for nausea/vomiting.   Also on Ativan  for anxiety and nausea management.     Review of Systems  Complains of pain.   Vital Signs:  BP 107/64 (BP Location: Right Arm)   Pulse 72   Temp 98.2 F (36.8 C) (Oral)   Resp 17   Ht 5' 7.99 (1.727 m)   Wt 59 kg   SpO2 96%   BMI 19.79 kg/m   Physical Exam: General: Sleepy, chronically ill-appearing, frail, cachectic Cardiovascular: RRR Respiratory: no increased work of breathing noted, not in respiratory distress Abdomen: not distended Extremities: Muscle wasting present in all extremities Neuro: Sleepy  Assessment & Plan:   Assessment: Patient is a 58 year old female with a past medical history of non-small cell lung cancer-has stated wishes to not receive cancer directed therapies, rheumatoid arthritis, Sjogren's disease, diabetes mellitus, hyperlipidemia, internal carotid artery aneurysm, left eye blindness, migraines, seizures, IBS, and neurogenic bladder requiring self-catheterization who was admitted on 05/01/2024 for management of worsening cancer pain.  Palliative medicine team consulted to assist with symptom management. Of  note patient is known to palliative medicine team and follows up with palliative medicine team in outpatient setting.  Recommendations/Plan: # Complex medical decision making/goals of care:     - Discussed care with patient as detailed above in HPI.  Patient has cancer progression on imaging last month and does not want to pursue cancer directed therapies.  Patient does not want to pursue radiation unless required for worsening pain management; at this time pain managed by medications.  Patient agreeing with reevaluation by [facility] for home hospice support.  Had already reviewed most form with patient.  If patient can go home with hospice, would need to complete MOST form to aim on comfort measures only.  Palliative medicine team will engage in conversations moving forward as able and appropriate.                -  Code Status: Limited: Do not attempt resuscitation (DNR) -DNR-LIMITED -Do Not Intubate/DNI                      # Symptom management Patient is receiving these palliative interventions for symptom management with an intent to improve quality of life.                 - Pain, severe acute on chronic pain in the setting of non-small cell lung cancer                                   p.o. Dilaudid    solution 2-4mg  q 4 hours prn. Based on requirements  of prn, will likely need further up titration of fentanyl  patch.                                  IV Dilaudid  to 0.5- 1 mg every 3 hours as needed to breakthrough after oral short acting opioids                               - fentanyl  patch 50 mcg every 72 hours                               -Consider use of dexamethasone  to help with pain management.  Patient chronically on prednisone  5 mg twice daily for RA.    - Not pursuing radiation at this time as pain being managed by medications.  If pain continues to worsen despite medications, could consider radiation involvement. Agree with trial of methylphenidate , agree with current pain and non-- pain  symptom management regimen.  Okay to discharge home with hospice whenever symptoms are most controlled.                - Nausea/vomiting Associated with pain and anxiety.  EKG on 04/28/2024 noting QTc 467.                               - Consider repeat EKG if receiving multiple QTc prolonging medications to monitor.                               Phenergan                                Discontinue Zofran  as per patient request.    -  oral Ativan  solution 0.26-5 mg every 4 hours as needed                                 IV Ativan  to 0.25-0.5 mg every 4 hours as needed                                - Constipation                                MiraLAX  17 g  BID.                                - Continue senna 1 tab twice daily.  Continue to adjust based on symptom response. X ray with stool burden.   # Discharge Planning: To Be Determined  - TOC assisting with discharge coordination.  ACC continuing to evaluate for possibility of home with hospice.  Discussed with: Patient,   RN,    Thank you for allowing the palliative care team to participate in the care Mary Erickson. Mod MDM Lonia Serve MD.  Palliative Care Provider PMT # [phone]  If patient remains symptomatic despite maximum doses, please call PMT at [phone] between 0700  and 1900. Outside of these hours, please call attending, as PMT does not have night coverage.     Problems Addressed: One or more chronic illnesses with severe exacerbation, progression, or side effects of treatment.  Risks: Parenteral controlled substances

## 2024-05-07 NOTE — Progress Notes (Signed)
 [facility] hospital liaison note:  Mary Erickson is referred to hospice services at home upon discharge. DME was delivered to home today.   Liaison will continue to follow and coordinate nursing visit once patient is ready for discharge.   Thank you for the opportunity to participate in this patient's plan of care  Hunter Seip, BSN, RN Hospice hospital liaison  [phone]

## 2024-05-07 NOTE — Plan of Care (Signed)

## 2024-05-08 ENCOUNTER — Other Ambulatory Visit (HOSPITAL_COMMUNITY): Payer: Self-pay

## 2024-05-08 ENCOUNTER — Telehealth (HOSPITAL_COMMUNITY): Payer: Self-pay | Admitting: Pharmacy Technician

## 2024-05-08 ENCOUNTER — Other Ambulatory Visit: Payer: Self-pay

## 2024-05-08 DIAGNOSIS — D75839 Thrombocytosis, unspecified: Secondary | ICD-10-CM | POA: Diagnosis not present

## 2024-05-08 DIAGNOSIS — R0789 Other chest pain: Secondary | ICD-10-CM | POA: Diagnosis not present

## 2024-05-08 DIAGNOSIS — D649 Anemia, unspecified: Secondary | ICD-10-CM | POA: Diagnosis not present

## 2024-05-08 DIAGNOSIS — G893 Neoplasm related pain (acute) (chronic): Secondary | ICD-10-CM | POA: Diagnosis not present

## 2024-05-08 DIAGNOSIS — C3491 Malignant neoplasm of unspecified part of right bronchus or lung: Secondary | ICD-10-CM | POA: Diagnosis not present

## 2024-05-08 LAB — GLUCOSE, CAPILLARY
Glucose-Capillary: 52 mg/dL — ABNORMAL LOW (ref 70–99)
Glucose-Capillary: 53 mg/dL — ABNORMAL LOW (ref 70–99)
Glucose-Capillary: 110 mg/dL — ABNORMAL HIGH (ref 70–99)
Glucose-Capillary: 67 mg/dL — ABNORMAL LOW (ref 70–99)

## 2024-05-08 MED ORDER — SENNA 8.6 MG PO TABS
1.0000 | ORAL_TABLET | Freq: Two times a day (BID) | ORAL | 0 refills | Status: DC
  Filled 2024-05-08: qty 120, 60d supply, fill #0

## 2024-05-08 MED ORDER — LORAZEPAM 2 MG/ML PO CONC
1.0000 mg | ORAL | 0 refills | Status: DC | PRN
  Filled 2024-05-08: qty 30, 10d supply, fill #0

## 2024-05-08 MED ORDER — DEXTROSE 50 % IV SOLN
25.0000 g | INTRAVENOUS | Status: DC
  Filled 2024-05-08: qty 50

## 2024-05-08 MED ORDER — METHYLPHENIDATE HCL 5 MG PO TABS
5.0000 mg | ORAL_TABLET | Freq: Two times a day (BID) | ORAL | 0 refills | Status: DC
Start: 2024-05-08 — End: 2024-05-14
  Filled 2024-05-08: qty 60, 30d supply, fill #0

## 2024-05-08 MED ORDER — HYDROMORPHONE HCL 1 MG/ML PO LIQD
2.0000 mg | ORAL | 0 refills | Status: DC | PRN
  Filled 2024-05-08: qty 56, 2d supply, fill #0

## 2024-05-08 MED ORDER — FENTANYL 50 MCG/HR TD PT72
1.0000 | MEDICATED_PATCH | TRANSDERMAL | 0 refills | Status: DC
  Filled 2024-05-08: qty 5, 15d supply, fill #0

## 2024-05-08 MED ORDER — MORPHINE SULFATE ER 15 MG PO TBCR
15.0000 mg | EXTENDED_RELEASE_TABLET | Freq: Two times a day (BID) | ORAL | 0 refills | Status: DC
  Filled 2024-05-08: qty 38, 19d supply, fill #0
  Filled 2024-05-08: qty 8, 4d supply, fill #0

## 2024-05-08 NOTE — Progress Notes (Signed)
 Hypoglycemic Event  CBG: 52  Treatment: 8 oz juice  Symptoms: Dizzy  Follow-up CBG: Time: 0456 CBG Result: 53 - 110 (final result)  Possible Reasons for Event: Medication Regimen, Inadequate Meal Intake  Patient lost PIV access when attempting to administer IV Dextrose . Patient given more juice 8 oz and a snack. Patient desired to have BG rechecked before administering Glucagon or Dextrose  stating All of this sugar is making me nauseous. New PIV placed, BG rechecked and the CBG result was 110. No further interventions needed at this time.   Comments/MD notified: A. Andrez Arlinda JONETTA Rollene

## 2024-05-08 NOTE — Progress Notes (Signed)
 Daily Progress Note   Patient Name: Mary Erickson       Date: 05/08/2024 DOB: 1966-04-18  Age: 58 y.o. MRN#: [id number] Attending Physician: Lue Elsie BROCKS, MD Primary Care Physician: Celestia Harder, NP Admit Date: 05/01/2024 Length of Stay: 6 days  Reason for Consultation/Follow-up: Establishing goals of care and Pain control  Subjective:   CC: Patient awake alert resting in bed, no distress, anticipating discharge today.   Subjective: Reviewed EMR prior to presenting to bedside.   Discharge medication list noted. Agree.    Review of Systems Pain controlled.   Vital Signs:  BP 109/65 (BP Location: Right Arm)   Pulse 78   Temp 98 F (36.7 C) (Oral)   Resp 18   Ht 5' 7.99 (1.727 m)   Wt 59 kg   SpO2 95%   BMI 19.79 kg/m   Physical Exam: General: awake alert Cardiovascular: RRR Respiratory: no increased work of breathing noted, not in respiratory distress Abdomen: not distended Extremities: Muscle wasting present in all extremities    Assessment & Plan:   Assessment: Patient is a 58 year old female with a past medical history of non-small cell lung cancer-has stated wishes to not receive cancer directed therapies, rheumatoid arthritis, Sjogren's disease, diabetes mellitus, hyperlipidemia, internal carotid artery aneurysm, left eye blindness, migraines, seizures, IBS, and neurogenic bladder requiring self-catheterization who was admitted on 05/01/2024 for management of worsening cancer pain.  Palliative medicine team consulted to assist with symptom management. Of note patient is known to palliative medicine team and follows up with palliative medicine team in outpatient setting.  Recommendations/Plan: # Complex medical decision making/goals of care:     - home with hospice.                 -  Code Status: Limited: Do not attempt resuscitation (DNR) -DNR-LIMITED -Do Not Intubate/DNI                      # Symptom management Patient is receiving these  palliative interventions for symptom management with an intent to improve quality of life.                 - Pain, severe acute on chronic pain in the setting of non-small cell lung cancer                                  Pain and non pain symptom management regimen noted.                 - Nausea/vomiting Associated with pain and anxiety.  EKG on 04/28/2024 noting QTc 467.                               - Consider repeat EKG if receiving multiple QTc prolonging medications to monitor.                               Phenergan                                Discontinue Zofran  as per patient request.    -  oral Ativan  solution 0.26-5 mg every 4 hours as needed  IV Ativan  to 0.25-0.5 mg every 4 hours as needed                                - Constipation                                MiraLAX  17 g  BID.                                - Continue senna 1 tab twice daily.  Continue to adjust based on symptom response. X ray with stool burden.   # Discharge Planning: To Be Determined  - TOC assisting with discharge coordination.  ACC continuing to evaluate for possibility of home with hospice.  Discussed with: Patient,   RN,    Thank you for allowing the palliative care team to participate in the care Gregory R Shreeve. Mod MDM Lonia Serve MD.  Palliative Care Provider PMT # [phone]  If patient remains symptomatic despite maximum doses, please call PMT at [phone] between 0700 and 1900. Outside of these hours, please call attending, as PMT does not have night coverage.     Problems Addressed: One or more chronic illnesses with severe exacerbation, progression, or side effects of treatment.  Risks: Parenteral controlled substances

## 2024-05-08 NOTE — Telephone Encounter (Signed)
 Pharmacy Patient Advocate Encounter  Received notification from [facility] that Prior Authorization for HYDROmorphone  HCl 1MG /ML liquid has been APPROVED from 05/08/2024 to 11/04/2024   PA #/Case ID/Reference #: [id number]  Key: VIVIANNE

## 2024-05-08 NOTE — Inpatient Diabetes Management (Signed)
 Inpatient Diabetes Program Recommendations  AACE/ADA: New Consensus Statement on Inpatient Glycemic Control (2015)  Target Ranges:  Prepandial:   less than 140 mg/dL      Peak postprandial:   less than 180 mg/dL (1-2 hours)      Critically ill patients:  140 - 180 mg/dL   Lab Results  Component Value Date   GLUCAP 67 (L) 05/08/2024   HGBA1C 6.9 (H) 12/08/2023    Review of Glycemic Control  Latest Reference Range & Units 05/07/24 11:50 05/07/24 17:09 05/07/24 21:04 05/08/24 04:20 05/08/24 04:56 05/08/24 05:58 05/08/24 08:45  Glucose-Capillary 70 - 99 mg/dL 806 (H) 89 876 (H) 52 (L) 53 (L) 110 (H) 67 (L)  (H): Data is abnormally high (L): Data is abnormally low Diabetes history: Type 1 DM Outpatient Diabetes medications: Tresiba 7 units BID, Humalog 1-15 units TID Current orders for Inpatient glycemic control: Semglee  8 units every day, Novolog  0-9 units TID & HS Prednisone  5 mg QD  Inpatient Diabetes Program Recommendations:    Consider reducing basal insulin  at discharge given hypoglycemia: Tresiba 7 units every day. Secure chat sent to MD.   Thanks, Tinnie Minus, MSN, RNC-OB Diabetes Coordinator [phone] (8a-5p)

## 2024-05-08 NOTE — Plan of Care (Addendum)
 VSS. BG 123 at bedtime. Patient given PRN Dilaudid  for pain and Phenergan  for nausea. LBM 6/25. At approximately 0420 patient notified staff of dizziness and a low BG reading on her CGM. BG was 52 when checked by staff. Patient given 8 oz juice with follow up BG result of 53. IV Dextrose  ordered, however patient lost only PIV access. Patient given more juice and snacks and requested that her BG be rechecked prior to administering Glucagon or Dextrose  stating All of this sugar is making me nauseous. A new PIV was placed, BG rechecked and the final result was 110 (provider notified).   Problem: Education: Goal: Knowledge of General Education information will improve Description: Including pain rating scale, medication(s)/side effects and non-pharmacologic comfort measures Outcome: Progressing   Problem: Health Behavior/Discharge Planning: Goal: Ability to manage health-related needs will improve Outcome: Progressing   Problem: Clinical Measurements: Goal: Ability to maintain clinical measurements within normal limits will improve Outcome: Progressing Goal: Will remain free from infection Outcome: Progressing   Problem: Nutrition: Goal: Adequate nutrition will be maintained Outcome: Progressing   Problem: Pain Managment: Goal: General experience of comfort will improve and/or be controlled Outcome: Progressing   Problem: Safety: Goal: Ability to remain free from injury will improve Outcome: Progressing   Problem: Fluid Volume: Goal: Ability to maintain a balanced intake and output will improve Outcome: Progressing   Problem: Metabolic: Goal: Ability to maintain appropriate glucose levels will improve Outcome: Progressing   Problem: Nutritional: Goal: Maintenance of adequate nutrition will improve Outcome: Progressing Goal: Progress toward achieving an optimal weight will improve Outcome: Progressing

## 2024-05-08 NOTE — Progress Notes (Signed)
 AVS reviewed with patient at bedside. All questions answered, and patient verbalized understanding. IV removed per order without complications. Patient to be discharged home via vehicle. Patient escorted to main entrance via WC by staff. Home medications delivered to patient at bedside.

## 2024-05-08 NOTE — TOC Transition Note (Signed)
 Transition of Care [facility]) - Discharge Note   Patient Details  Name: Mary Erickson MRN: [id number] Date of Birth: 1966-10-14  [facility] CM/SW Contact:  Hoy DELENA Bigness, LCSW Phone Number: 05/08/2024, 9:51 AM   Clinical Narrative:    Pt to return home under hospice services through [facility]. Needed DME delivered to pt's home yesterday 6/26. Pt to have medicine delivered to bedside from [facility]. Pt provided with GoodRx coupon and information for how to obtain coupon in future for prescription for methylphenidate  as hospice does not cover this prescription. Signed DNR placed in DC packet. Pt has family/friend to provide transport at discharge.    Final next level of care: Home w Hospice Care Barriers to Discharge: No Barriers Identified   Patient Goals and CMS Choice Patient states their goals for this hospitalization and ongoing recovery are:: To return home with hospice CMS Medicare.gov Compare Post Acute Care list provided to:: Patient Choice offered to / list presented to : Patient Gross ownership interest in [facility].provided to::  (NA)    Discharge Placement                       Discharge Plan and Services Additional resources added to the After Visit Summary for   In-house Referral: Clinical Social Work, Hospice / Palliative Care Discharge Planning Services: NA Post Acute Care Choice: Hospice                               Social Drivers of Health (SDOH) Interventions SDOH Screenings   Food Insecurity: No Food Insecurity (05/02/2024)  Housing: Low Risk  (05/02/2024)  Transportation Needs: No Transportation Needs (05/02/2024)  Utilities: Not At Risk (05/02/2024)  Depression (PHQ2-9): Medium Risk (06/30/2019)  Financial Resource Strain: Low Risk  (02/13/2024)   Received from [facility]  Physical Activity: Unknown (08/13/2023)   Received from [facility]  Social Connections: Moderately Integrated (04/09/2024)   Stress: Patient Declined (08/13/2023)   Received from [facility]  Tobacco Use: Medium Risk (05/02/2024)     Readmission Risk Interventions    05/04/2024   12:03 PM 05/03/2024   10:45 AM 04/26/2024   11:31 AM  Readmission Risk Prevention Plan  Transportation Screening Complete Complete Complete  Medication Review Oceanographer) Complete Complete Complete  PCP or Specialist appointment within 3-5 days of discharge Complete Complete Complete  HRI or Home Care Consult Complete Complete Complete  SW Recovery Care/Counseling Consult Complete Complete Not Complete  SW Consult Not Complete Comments   N/A  Palliative Care Screening Complete Not Applicable Complete  Skilled Nursing Facility Not Applicable Not Applicable Not Applicable

## 2024-05-08 NOTE — Discharge Summary (Signed)
 Physician Discharge Summary  Mary Erickson FMW:[id number] DOB: 1966/07/04 DOA: 05/01/2024  PCP: Celestia Harder, NP  Admit date: 05/01/2024 Discharge date: 05/08/2024  Admitted From: Home Disposition: Home  Recommendations for Outpatient Follow-up:  Follow up with home hospice  Home Health: None Equipment/Devices: None  Discharge Condition: Stable CODE STATUS: DNR Diet recommendation: As tolerated  Brief/Interim Summary: Mary Erickson is a 58 y.o. female with a history of non-small cell lung cancer, rheumatoid arthritis, Sjogren's disease, diabetes mellitus type, hyperlipidemia, internal carotid artery aneurysm, left eye blindness, migraine, seizures, IBS, neurogenic bladder, chronic constipation.  Patient presented secondary to intractable pain of her right chest related to her known cancer.  Patient started on IV analgesics for management.  Patient admitted as above with worsening pain in the setting of metastatic disease.  Pain currently controlled on new regimen as below otherwise stable and agreeable for discharge back home to continue hospice care.    Discharge Diagnoses:  Principal Problem:   Chest wall pain Active Problems:   Palliative care encounter   High risk medication use   Medication management   Anxiety   Need for emotional support   Counseling and coordination of care   DNR (do not resuscitate)   ACP (advance care planning)   NSCLC of right lung (HCC)   Nausea   Cancer associated pain   Anemia   Thrombocytosis  Intractable chest pain Intractable cancer-related pain Currently controlled on regimen as below   Nausea and vomiting, resolved Improved with supportive care   Non-small cell lung cancer of the right lung Patient follows with Dr. Gatha as an outpatient, in addition to pulmonology.  Patient also follows with palliative care. -Will defer radiation oncology consultation as pain is better managed with change in pain management  regimen   Possible post-obstructive pneumonia Chest x-ray suggests possible pneumonia versus atelectasis. Patient has an associated leukocytosis. -Levaquin  750 mg daily x5 days -last dose 05/07/2024   Rheumatoid arthritis Sjogren's disease Patient is on prednisone  as outpatient.  Continued on admission. - Continue prednisone    Diabetes mellitus type 1 Well-controlled based on last hemoglobin A1c of 6.9%.  Patient is managed degludec and insulin  was as an outpatient.  Medications not restarted on admission. - Increase to Semglee  8 units every night - Sliding scale insulin    Hyponatremia Likely secondary to poor oral intake. Started on IV fluids with improvement. Urine studies no not suggest dehydration, though, and rather possible SIADH.   ADHD Noted.   Hypothyroidism - Continue levothyroxine  200 mcg daily   History of migraines Noted.  Asymptomatic.   History of seizures Patient is not on antiepileptic therapy.   Irritable bowel syndrome Chronic constipation - Continue MiraLAX    Neurogenic bladder Patient self catheterizes  Discharge Instructions  Discharge Instructions     Call MD for:  severe uncontrolled pain   Complete by: As directed    Diet - low sodium heart healthy   Complete by: As directed    Increase activity slowly   Complete by: As directed       Allergies as of 05/08/2024       Reactions   Doxycycline Other (See Comments)   Elevates liver enzymes   Aspirin  Other (See Comments)   Can take 81 mg but has to be coated; No other Aspirin  due to bleeding in stools from ulcers   Nsaids Other (See Comments)   GI issues    Gluten Meal Other (See Comments)   Avoid due to autoimmune issues  Lactose Other (See Comments)   Avoid due to autoimmune issues    Oxycodone  Nausea And Vomiting   Sulfa Antibiotics Nausea And Vomiting        Medication List     STOP taking these medications    fentaNYL  12 MCG/HR Commonly known as: DURAGESIC  Replaced  by: fentaNYL  50 MCG/HR   Levemir  FlexTouch 100 UNIT/ML FlexTouch Pen Generic drug: insulin  detemir   methylphenidate  36 MG CR tablet Commonly known as: CONCERTA  Replaced by: methylphenidate  5 MG tablet       TAKE these medications    B-12 3000 MCG Caps Take 3,000 mcg by mouth daily.   BENADRYL  ALLERGY PO Take 25 mg by mouth daily as needed (allergies/sleep).   [employer] ALLERGY EYE DROPS OP Place 2 drops into both eyes daily as needed (allergies).   Dexlansoprazole  30 MG capsule DR Commonly known as: Dexilant  Take 1 capsule (30 mg total) by mouth daily.   diclofenac  Sodium 1 % Gel Commonly known as: VOLTAREN  Apply 2 g topically 4 (four) times daily. What changed:  how much to take when to take this reasons to take this   fentaNYL  50 MCG/HR Commonly known as: DURAGESIC  Place 1 patch onto the skin every 3 (three) days. Start taking on: May 09, 2024 Replaces: fentaNYL  12 MCG/HR   fluticasone -salmeterol 115-21 MCG/ACT inhaler Commonly known as: Advair  HFA Inhale 2 puffs into the lungs 2 (two) times daily.   HYDROmorphone  HCl 1 MG/ML Liqd Commonly known as: DILAUDID  Take 2-4 mLs (2-4 mg total) by mouth every 3 (three) hours as needed for severe pain (pain score 7-10) or moderate pain (pain score 4-6).   Insulin  lispro 100 UNIT/ML Commonly known as: HUMALOG Junior KwikPen Inject 1-15 Units into the skin 3 (three) times daily as needed (high blood sugar). Insulin  to carb ratio 1:13   levothyroxine  200 MCG tablet Commonly known as: SYNTHROID  Take 1 tablet (200 mcg total) by mouth daily at 6 (six) AM.   linaclotide  145 MCG Caps capsule Commonly known as: Linzess  Take 1 capsule (145 mcg total) by mouth daily before breakfast. What changed: when to take this   LORazepam  2 MG/ML concentrated solution Commonly known as: ATIVAN  Take 0.5 mLs (1 mg total) by mouth every 4 (four) hours as needed for anxiety (Nausea/vomiting).   Melatonin 10-10 MG Tbcr Take 10 mg by mouth  at bedtime.   methylphenidate  5 MG tablet Commonly known as: RITALIN  Take 1 tablet (5 mg total) by mouth 2 (two) times daily with breakfast and lunch. Replaces: methylphenidate  36 MG CR tablet   morphine  15 MG 12 hr tablet Commonly known as: MS CONTIN  Take 1 tablet (15 mg total) by mouth every 12 (twelve) hours. What changed:  medication strength how much to take Another medication with the same name was removed. Continue taking this medication, and follow the directions you see here.   naloxone  4 MG/0.1ML Liqd nasal spray kit Commonly known as: Narcan  Place 1 spray into the nose as needed. What changed: reasons to take this   ondansetron  4 MG disintegrating tablet Commonly known as: ZOFRAN -ODT Take 1 tablet (4 mg total) by mouth every 6 (six) hours as needed for nausea or vomiting.   polyethylene glycol powder 17 GM/SCOOP powder Commonly known as: GLYCOLAX /MIRALAX  Take 17 g by mouth 2 (two) times daily. What changed: when to take this   predniSONE  5 MG tablet Commonly known as: DELTASONE  Take 1 tablet (5 mg total) by mouth 2 (two) times daily.   promethazine  25  MG suppository Commonly known as: PHENERGAN  Place 1 suppository (25 mg total) rectally every 6 (six) hours as needed for nausea or vomiting.   senna 8.6 MG Tabs tablet Commonly known as: SENOKOT Take 1 tablet (8.6 mg total) by mouth 2 (two) times daily.   SYSTANE OP Place 1 drop into both eyes 3 (three) times daily as needed (dry eyes).   Tresiba FlexTouch 100 UNIT/ML FlexTouch Pen Generic drug: insulin  degludec Inject 7 Units into the skin 2 (two) times daily.   Vitamin D3 125 MCG (5000 UT) Caps Take 5,000 Units by mouth daily.   vitamin E  180 MG (400 UNITS) capsule Take 400 Units by mouth at bedtime.        Follow-up Information     [facility] Follow up.   Why: Please follow up with this provider to continue outpatient palliative services upon discharge. Contact information: [address]  [facility] [place]  [id number]               Allergies  Allergen Reactions   Doxycycline Other (See Comments)    Elevates liver enzymes    Aspirin  Other (See Comments)    Can take 81 mg but has to be coated; No other Aspirin  due to bleeding in stools from ulcers   Nsaids Other (See Comments)    GI issues    Gluten Meal Other (See Comments)    Avoid due to autoimmune issues    Lactose Other (See Comments)    Avoid due to autoimmune issues    Oxycodone  Nausea And Vomiting   Sulfa Antibiotics Nausea And Vomiting    Consultations: -Palliative care  Procedures/Studies: DG Abd 2 Views Result Date: 05/05/2024 CLINICAL DATA:  Vomiting EXAM: ABDOMEN - 2 VIEW COMPARISON:  Abdominal x-ray 06/18/2023. CT chest abdomen and pelvis 04/08/2024. FINDINGS: Right upper lobe lung mass is partially imaged, unchanged. Bowel gas pattern is nonobstructive. There is a very large amount of stool throughout the entire colon. Multiple surgical clips are seen in the pelvis. Stomach and small bowel are nondilated. Cholecystectomy clips are present. No suspicious calcifications. IMPRESSION: Very large amount of stool throughout the entire colon. No evidence of bowel obstruction. Electronically Signed   By: Greig Pique M.D.   On: 05/05/2024 17:55   DG Chest 2 View Result Date: 05/02/2024 CLINICAL DATA:  Cough EXAM: CHEST - 2 VIEW COMPARISON:  Chest radiograph dated 04/24/2024 FINDINGS: Normal lung volumes. Similar appearance of known right apical mass. Interval increased density of the right upper lobe. Left basilar linear opacities. No pleural effusion or pneumothorax. The heart size and mediastinal contours are within normal limits. No acute osseous abnormality. Surgical clips project over the neck. IMPRESSION: 1. Similar appearance of known right apical mass. Interval increased density of the right upper lobe, which may represent postobstructive atelectasis or pneumonia. 2. Left basilar linear  opacities, likely atelectasis. Electronically Signed   By: Limin  Xu M.D.   On: 05/02/2024 16:53   DG Chest Portable 1 View Result Date: 04/24/2024 CLINICAL DATA:  Known right lung tumor and increasing chest pain, initial encounter EXAM: PORTABLE CHEST 1 VIEW COMPARISON:  04/08/2024 CT FINDINGS: Cardiac shadow is mildly prominent. Left lung is clear. Large soft tissue mass is noted in the right mid and upper lung similar to that seen on prior CT. No new focal abnormality is noted. IMPRESSION: Stable right lung mass.  No new focal abnormality is noted. Electronically Signed   By: Oneil Devonshire M.D.   On: 04/24/2024 18:50  CT CHEST ABDOMEN PELVIS W CONTRAST Result Date: 04/08/2024 CLINICAL DATA:  concern for obstruction or hiatal hernia complication. EXAM: CT CHEST, ABDOMEN, AND PELVIS WITH CONTRAST TECHNIQUE: Multidetector CT imaging of the chest, abdomen and pelvis was performed following the standard protocol during bolus administration of intravenous contrast. RADIATION DOSE REDUCTION: This exam was performed according to the departmental dose-optimization program which includes automated exposure control, adjustment of the mA and/or kV according to patient size and/or use of iterative reconstruction technique. CONTRAST:  OMNIPAQUE  IOHEXOL  300 MG/ML  SOLN COMPARISON:  None Available. FINDINGS: CT CHEST FINDINGS Cardiovascular: Normal heart size. No significant pericardial effusion. The thoracic aorta is normal in caliber. No atherosclerotic plaque of the thoracic aorta. No coronary artery calcifications. No central pulmonary embolus. Limited evaluation more distally due to artifact with question developing right upper lobe segmental pulmonary embolus. Mediastinum/Nodes: No enlarged mediastinal, hilar, or axillary lymph nodes. Thyroid  gland, trachea, and esophagus demonstrate no significant findings. Lungs/Pleura: Centrilobular emphysematous changes. No focal consolidation. No pulmonary nodule. Grossly  stable in size of a 12 x 12 cm centrally necrotic heterogeneous right upper lobe mass. Similar-appearing surrounding ground-glass airspace opacities and interlobular septal thickening. Redemonstration of mass effect on the right upper lobe and right mainstem bronchi. No pleural effusion. No pneumothorax. Musculoskeletal: No chest wall abnormality. No suspicious lytic or blastic osseous lesions. No acute displaced fracture. Multilevel degenerative changes of the spine. CT ABDOMEN PELVIS FINDINGS Hepatobiliary: A 3.7 x 3.4 cm right posterior hepatic lobe hepatic hemangioma. Several other pericentimeter hypodensities of the right posterior hepatic lobe suggestive of flash filling hemangiomas (2: 55, 60). Post cholecystectomy. No biliary dilatation. Pancreas: No focal lesion. Normal pancreatic contour. No surrounding inflammatory changes. No main pancreatic ductal dilatation. Spleen: Normal in size without focal abnormality. Adrenals/Urinary Tract: No adrenal nodule bilaterally. Bilateral kidneys enhance symmetrically. No hydronephrosis. No hydroureter. The urinary bladder is unremarkable.+ On delayed imaging, there is no urothelial wall thickening and there are no filling defects in the opacified portions of the bilateral collecting systems or ureters. Stomach/Bowel: Stomach is within normal limits. No evidence of bowel wall thickening or dilatation. Stool throughout the colon. Appendix appears normal. Vascular/Lymphatic: No abdominal aorta or iliac aneurysm. Mild atherosclerotic plaque of the aorta and its branches. Lymph node dissection of the pelvis. No abdominal, pelvic, or inguinal lymphadenopathy. Reproductive: Uterus and bilateral adnexa are unremarkable. Other: No intraperitoneal free fluid. No intraperitoneal free gas. No organized fluid collection. Musculoskeletal: No abdominal wall hernia or abnormality. No suspicious lytic or blastic osseous lesions. No acute displaced fracture. Multilevel degenerative  changes of the spine. IMPRESSION: 1. No central pulmonary embolus. Limited evaluation more distally due to artifact with question developing right upper lobe segmental pulmonary embolus. No right heart strain or pulmonary infarction. 2. Grossly stable in size of a 12 x 12 cm centrally necrotic heterogeneous right upper lobe mass. Associated mass effect on the right mainstem bronchi. 3. Stool throughout the colon consistent with constipation. 4.  Emphysema (ICD10-J43.9). Electronically Signed   By: Morgane  Naveau M.D.   On: 04/08/2024 20:58     Subjective: No acute issues or events overnight   Discharge Exam: Vitals:   05/07/24 2103 05/08/24 0425  BP: 128/63 109/65  Pulse: 80 78  Resp: 18 18  Temp: 98.4 F (36.9 C) 98 F (36.7 C)  SpO2: 96% 97%   Vitals:   05/07/24 0521 05/07/24 1412 05/07/24 2103 05/08/24 0425  BP: 107/64 (!) 98/52 128/63 109/65  Pulse: 72 72 80 78  Resp: 17  20 18 18   Temp: 98.2 F (36.8 C) 98.1 F (36.7 C) 98.4 F (36.9 C) 98 F (36.7 C)  TempSrc: Oral Oral Oral Oral  SpO2: 96% 98% 96% 97%  Weight:      Height:        General: Pt is alert, awake, not in acute distress Cardiovascular: RRR, S1/S2 +, no rubs, no gallops Respiratory: CTA bilaterally, no wheezing, no rhonchi Abdominal: Soft, NT, ND, bowel sounds + Extremities: no edema, no cyanosis    The results of significant diagnostics from this hospitalization (including imaging, microbiology, ancillary and laboratory) are listed below for reference.     Microbiology: No results found for this or any previous visit (from the past 240 hours).   Labs: BNP (last 3 results) No results for input(s): BNP in the last 8760 hours. Basic Metabolic Panel: Recent Labs  Lab 05/01/24 2208 05/02/24 1120 05/03/24 0826 05/04/24 0546 05/06/24 0539  NA 128* 126* 130* 129* 123*  K 4.6 4.0 4.0 4.0 3.9  CL 89* 91* 98 94* 90*  CO2 29 27 22 25 24   GLUCOSE 152* 154* 127* 54* 175*  BUN 9 11 9 10 12    CREATININE 0.74 0.75 0.87 0.71 0.70  CALCIUM  9.6 8.3* 8.4* 8.7* 8.3*   Liver Function Tests: Recent Labs  Lab 05/01/24 2208  AST 53*  ALT 27  ALKPHOS 147*  BILITOT 0.4  PROT 7.6  ALBUMIN 3.8   Recent Labs  Lab 05/01/24 2208  LIPASE <10*   No results for input(s): AMMONIA in the last 168 hours. CBC: Recent Labs  Lab 05/01/24 2208 05/02/24 1120 05/03/24 0826 05/04/24 0546 05/06/24 0539  WBC 17.4* 17.3* 17.3* 16.0* 17.9*  NEUTROABS 14.7*  --   --   --   --   HGB 10.9* 11.3* 10.7* 10.8* 11.6*  HCT 33.9* 35.4* 34.6* 34.6* 37.3  MCV 84.5 86.8 86.7 87.6 87.6  PLT 529* 505* 487* 518* 529*   Cardiac Enzymes: No results for input(s): CKTOTAL, CKMB, CKMBINDEX, TROPONINI in the last 168 hours. BNP: Invalid input(s): POCBNP CBG: Recent Labs  Lab 05/07/24 1709 05/07/24 2104 05/08/24 0420 05/08/24 0456 05/08/24 0558  GLUCAP 89 123* 52* 53* 110*   D-Dimer No results for input(s): DDIMER in the last 72 hours. Hgb A1c No results for input(s): HGBA1C in the last 72 hours. Lipid Profile No results for input(s): CHOL, HDL, LDLCALC, TRIG, CHOLHDL, LDLDIRECT in the last 72 hours. Thyroid  function studies No results for input(s): TSH, T4TOTAL, T3FREE, THYROIDAB in the last 72 hours.  Invalid input(s): FREET3 Anemia work up No results for input(s): VITAMINB12, FOLATE, FERRITIN, TIBC, IRON, RETICCTPCT in the last 72 hours. Urinalysis    Component Value Date/Time   COLORURINE COLORLESS (A) 05/01/2024 2208   APPEARANCEUR CLEAR 05/01/2024 2208   LABSPEC <1.005 (L) 05/01/2024 2208   PHURINE 8.0 05/01/2024 2208   GLUCOSEU NEGATIVE 05/01/2024 2208   HGBUR NEGATIVE 05/01/2024 2208   BILIRUBINUR NEGATIVE 05/01/2024 2208   BILIRUBINUR negative 12/22/2023 1234   KETONESUR NEGATIVE 05/01/2024 2208   PROTEINUR NEGATIVE 05/01/2024 2208   UROBILINOGEN 0.2 12/22/2023 1234   UROBILINOGEN 0.2 11/21/2022 1337   NITRITE NEGATIVE  05/01/2024 2208   LEUKOCYTESUR NEGATIVE 05/01/2024 2208   Sepsis Labs Recent Labs  Lab 05/02/24 1120 05/03/24 0826 05/04/24 0546 05/06/24 0539  WBC 17.3* 17.3* 16.0* 17.9*   Microbiology No results found for this or any previous visit (from the past 240 hours).   Time coordinating discharge: Over 30 minutes  SIGNED:  Elsie JAYSON [place], DO [facility] 05/08/2024, 7:36 AM Pager   If 7PM-7AM, please contact night-coverage [email or web address]

## 2024-06-04 ENCOUNTER — Encounter

## 2024-06-12 DEATH — deceased
